# Patient Record
Sex: Male | Born: 1950 | Race: White | Hispanic: No | Smoking: Current every day smoker
Health system: Southern US, Community
[De-identification: ages and names within clinical notes are randomized; demographics above are authoritative.]

## PROBLEM LIST (undated history)

## (undated) DIAGNOSIS — K219 Gastro-esophageal reflux disease without esophagitis: Secondary | ICD-10-CM

## (undated) DIAGNOSIS — F32A Depression, unspecified: Secondary | ICD-10-CM

## (undated) DIAGNOSIS — K635 Polyp of colon: Secondary | ICD-10-CM

## (undated) DIAGNOSIS — K5730 Diverticulosis of large intestine without perforation or abscess without bleeding: Secondary | ICD-10-CM

## (undated) DIAGNOSIS — K5792 Diverticulitis of intestine, part unspecified, without perforation or abscess without bleeding: Secondary | ICD-10-CM

## (undated) DIAGNOSIS — F329 Major depressive disorder, single episode, unspecified: Secondary | ICD-10-CM

## (undated) DIAGNOSIS — N200 Calculus of kidney: Secondary | ICD-10-CM

## (undated) HISTORY — DX: Calculus of kidney: N20.0

## (undated) HISTORY — PX: UPPER GASTROINTESTINAL ENDOSCOPY: SHX188

## (undated) HISTORY — DX: Diverticulosis of large intestine without perforation or abscess without bleeding: K57.30

## (undated) HISTORY — DX: Gastro-esophageal reflux disease without esophagitis: K21.9

## (undated) HISTORY — PX: COLONOSCOPY: SHX174

## (undated) HISTORY — DX: Polyp of colon: K63.5

## (undated) HISTORY — DX: Major depressive disorder, single episode, unspecified: F32.9

## (undated) HISTORY — DX: Depression, unspecified: F32.A

## (undated) HISTORY — DX: Diverticulitis of intestine, part unspecified, without perforation or abscess without bleeding: K57.92

---

## 1999-02-19 ENCOUNTER — Emergency Department (HOSPITAL_COMMUNITY): Admission: EM | Admit: 1999-02-19 | Discharge: 1999-02-20 | Payer: Self-pay | Admitting: Emergency Medicine

## 1999-02-20 ENCOUNTER — Encounter: Payer: Self-pay | Admitting: Emergency Medicine

## 1999-02-22 ENCOUNTER — Encounter: Admission: RE | Admit: 1999-02-22 | Discharge: 1999-02-22 | Payer: Self-pay | Admitting: Plastic Surgery

## 1999-02-22 ENCOUNTER — Encounter: Payer: Self-pay | Admitting: Plastic Surgery

## 1999-05-06 ENCOUNTER — Encounter: Payer: Self-pay | Admitting: Internal Medicine

## 1999-05-06 ENCOUNTER — Encounter: Admission: RE | Admit: 1999-05-06 | Discharge: 1999-05-06 | Payer: Self-pay | Admitting: Internal Medicine

## 2002-05-22 ENCOUNTER — Encounter: Admission: RE | Admit: 2002-05-22 | Discharge: 2002-05-22 | Payer: Self-pay | Admitting: Internal Medicine

## 2002-05-22 ENCOUNTER — Encounter: Payer: Self-pay | Admitting: Internal Medicine

## 2002-08-04 ENCOUNTER — Encounter: Payer: Self-pay | Admitting: Internal Medicine

## 2003-03-11 ENCOUNTER — Encounter: Payer: Self-pay | Admitting: Internal Medicine

## 2004-02-15 ENCOUNTER — Ambulatory Visit: Payer: Self-pay | Admitting: Internal Medicine

## 2004-08-30 ENCOUNTER — Ambulatory Visit: Payer: Self-pay | Admitting: Internal Medicine

## 2004-09-20 ENCOUNTER — Ambulatory Visit: Payer: Self-pay | Admitting: Internal Medicine

## 2004-09-27 ENCOUNTER — Ambulatory Visit: Payer: Self-pay | Admitting: Internal Medicine

## 2004-10-06 ENCOUNTER — Ambulatory Visit: Payer: Self-pay | Admitting: Internal Medicine

## 2004-10-06 ENCOUNTER — Ambulatory Visit (HOSPITAL_COMMUNITY): Admission: RE | Admit: 2004-10-06 | Discharge: 2004-10-06 | Payer: Self-pay | Admitting: Internal Medicine

## 2004-11-17 ENCOUNTER — Ambulatory Visit: Payer: Self-pay | Admitting: Internal Medicine

## 2005-07-27 ENCOUNTER — Ambulatory Visit: Payer: Self-pay | Admitting: Internal Medicine

## 2006-06-27 ENCOUNTER — Ambulatory Visit: Payer: Self-pay | Admitting: Internal Medicine

## 2006-06-27 LAB — CONVERTED CEMR LAB
ALT: 34 units/L (ref 0–40)
AST: 46 units/L — ABNORMAL HIGH (ref 0–37)
Albumin: 3.8 g/dL (ref 3.5–5.2)
Alkaline Phosphatase: 63 units/L (ref 39–117)
BUN: 5 mg/dL — ABNORMAL LOW (ref 6–23)
Basophils Absolute: 0 10*3/uL (ref 0.0–0.1)
Basophils Relative: 0.6 % (ref 0.0–1.0)
Bilirubin, Direct: 0.2 mg/dL (ref 0.0–0.3)
CO2: 31 meq/L (ref 19–32)
Calcium: 9.3 mg/dL (ref 8.4–10.5)
Chloride: 108 meq/L (ref 96–112)
Cholesterol: 169 mg/dL (ref 0–200)
Creatinine, Ser: 0.7 mg/dL (ref 0.4–1.5)
Eosinophils Absolute: 0.1 10*3/uL (ref 0.0–0.6)
Eosinophils Relative: 3.4 % (ref 0.0–5.0)
GFR calc Af Amer: 152 mL/min
GFR calc non Af Amer: 125 mL/min
Glucose, Bld: 85 mg/dL (ref 70–99)
HCT: 42.6 % (ref 39.0–52.0)
HDL: 72.9 mg/dL (ref >39.0)
Hemoglobin: 14.9 g/dL (ref 13.0–17.0)
LDL Cholesterol: 86 mg/dL (ref 0–99)
Lymphocytes Relative: 44.1 % (ref 12.0–46.0)
MCHC: 35 g/dL (ref 30.0–36.0)
MCV: 97.7 fL (ref 78.0–100.0)
Monocytes Absolute: 0.7 10*3/uL (ref 0.2–0.7)
Monocytes Relative: 16.6 % — ABNORMAL HIGH (ref 3.0–11.0)
Neutro Abs: 1.5 10*3/uL (ref 1.4–7.7)
Neutrophils Relative %: 35.3 % — ABNORMAL LOW (ref 43.0–77.0)
PSA: 0.62 ng/mL (ref 0.10–4.00)
Platelets: 253 10*3/uL (ref 150–400)
Potassium: 3.4 meq/L — ABNORMAL LOW (ref 3.5–5.1)
RBC: 4.36 M/uL (ref 4.22–5.81)
RDW: 12.9 % (ref 11.5–14.6)
Sodium: 145 meq/L (ref 135–145)
TSH: 1.19 microintl units/mL (ref 0.35–5.50)
Total Bilirubin: 0.9 mg/dL (ref 0.3–1.2)
Total CHOL/HDL Ratio: 2.3
Total Protein: 6.5 g/dL (ref 6.0–8.3)
Triglycerides: 50 mg/dL (ref 0–149)
VLDL: 10 mg/dL (ref 0–40)
WBC: 4.3 10*3/uL — ABNORMAL LOW (ref 4.5–10.5)

## 2006-07-11 ENCOUNTER — Ambulatory Visit: Payer: Self-pay | Admitting: Internal Medicine

## 2006-10-30 ENCOUNTER — Ambulatory Visit: Payer: Self-pay | Admitting: Internal Medicine

## 2006-11-05 DIAGNOSIS — Z860100 Personal history of colon polyps, unspecified: Secondary | ICD-10-CM | POA: Insufficient documentation

## 2006-11-05 DIAGNOSIS — F329 Major depressive disorder, single episode, unspecified: Secondary | ICD-10-CM | POA: Insufficient documentation

## 2006-11-06 ENCOUNTER — Ambulatory Visit: Payer: Self-pay | Admitting: Internal Medicine

## 2006-12-25 ENCOUNTER — Ambulatory Visit: Payer: Self-pay | Admitting: Internal Medicine

## 2007-01-30 ENCOUNTER — Ambulatory Visit: Payer: Self-pay | Admitting: Internal Medicine

## 2007-01-31 LAB — CONVERTED CEMR LAB
ALT: 27 units/L (ref 0–53)
AST: 35 units/L (ref 0–37)
Albumin: 4.3 g/dL (ref 3.5–5.2)
Alkaline Phosphatase: 80 units/L (ref 39–117)
Amylase: 57 units/L (ref 27–131)
BUN: 5 mg/dL — ABNORMAL LOW (ref 6–23)
Basophils Absolute: 0.1 10*3/uL (ref 0.0–0.1)
Basophils Relative: 0.9 % (ref 0.0–1.0)
Bilirubin, Direct: 0.4 mg/dL — ABNORMAL HIGH (ref 0.0–0.3)
CO2: 31 meq/L (ref 19–32)
Calcium: 10.6 mg/dL — ABNORMAL HIGH (ref 8.4–10.5)
Chloride: 101 meq/L (ref 96–112)
Creatinine, Ser: 0.8 mg/dL (ref 0.4–1.5)
Eosinophils Absolute: 0.1 10*3/uL (ref 0.0–0.6)
Eosinophils Relative: 0.9 % (ref 0.0–5.0)
GFR calc Af Amer: 130 mL/min
GFR calc non Af Amer: 107 mL/min
Glucose, Bld: 120 mg/dL — ABNORMAL HIGH (ref 70–99)
HCT: 48.4 % (ref 39.0–52.0)
Hemoglobin: 16.8 g/dL (ref 13.0–17.0)
Lymphocytes Relative: 22.7 % (ref 12.0–46.0)
MCHC: 34.7 g/dL (ref 30.0–36.0)
MCV: 95.8 fL (ref 78.0–100.0)
Monocytes Absolute: 1 10*3/uL — ABNORMAL HIGH (ref 0.2–0.7)
Monocytes Relative: 15.6 % — ABNORMAL HIGH (ref 3.0–11.0)
Neutro Abs: 4 10*3/uL (ref 1.4–7.7)
Neutrophils Relative %: 59.9 % (ref 43.0–77.0)
Platelets: 222 10*3/uL (ref 150–400)
Potassium: 4.3 meq/L (ref 3.5–5.1)
RBC: 5.05 M/uL (ref 4.22–5.81)
RDW: 12.4 % (ref 11.5–14.6)
Sodium: 141 meq/L (ref 135–145)
Total Bilirubin: 2 mg/dL — ABNORMAL HIGH (ref 0.3–1.2)
Total Protein: 7.3 g/dL (ref 6.0–8.3)
WBC: 6.7 10*3/uL (ref 4.5–10.5)

## 2007-05-03 ENCOUNTER — Ambulatory Visit: Payer: Self-pay | Admitting: Internal Medicine

## 2007-05-03 LAB — CONVERTED CEMR LAB
ALT: 20 units/L (ref 0–53)
AST: 24 units/L (ref 0–37)
Albumin: 4 g/dL (ref 3.5–5.2)
Alkaline Phosphatase: 57 units/L (ref 39–117)
BUN: 6 mg/dL (ref 6–23)
Basophils Absolute: 0 10*3/uL (ref 0.0–0.1)
Basophils Relative: 0.7 % (ref 0.0–1.0)
Bilirubin Urine: NEGATIVE
Bilirubin, Direct: 0.2 mg/dL (ref 0.0–0.3)
Blood in Urine, dipstick: NEGATIVE
CO2: 29 meq/L (ref 19–32)
Calcium: 10 mg/dL (ref 8.4–10.5)
Chloride: 105 meq/L (ref 96–112)
Cholesterol: 178 mg/dL (ref 0–200)
Creatinine, Ser: 0.9 mg/dL (ref 0.4–1.5)
Eosinophils Absolute: 0.2 10*3/uL (ref 0.0–0.6)
Eosinophils Relative: 2.8 % (ref 0.0–5.0)
GFR calc Af Amer: 113 mL/min
GFR calc non Af Amer: 93 mL/min
Glucose, Bld: 104 mg/dL — ABNORMAL HIGH (ref 70–99)
Glucose, Urine, Semiquant: NEGATIVE
HCT: 45.3 % (ref 39.0–52.0)
HDL: 71.1 mg/dL (ref >39.0)
Hemoglobin: 15.6 g/dL (ref 13.0–17.0)
Ketones, urine, test strip: NEGATIVE
LDL Cholesterol: 100 mg/dL — ABNORMAL HIGH (ref 0–99)
Lymphocytes Relative: 38.8 % (ref 12.0–46.0)
MCHC: 34.4 g/dL (ref 30.0–36.0)
MCV: 97.6 fL (ref 78.0–100.0)
Monocytes Absolute: 0.8 10*3/uL — ABNORMAL HIGH (ref 0.2–0.7)
Monocytes Relative: 12.2 % — ABNORMAL HIGH (ref 3.0–11.0)
Neutro Abs: 2.8 10*3/uL (ref 1.4–7.7)
Neutrophils Relative %: 45.5 % (ref 43.0–77.0)
Nitrite: NEGATIVE
PSA: 0.44 ng/mL (ref 0.10–4.00)
Platelets: 265 10*3/uL (ref 150–400)
Potassium: 5.1 meq/L (ref 3.5–5.1)
RBC: 4.64 M/uL (ref 4.22–5.81)
RDW: 13.2 % (ref 11.5–14.6)
Sodium: 142 meq/L (ref 135–145)
Specific Gravity, Urine: >=1.03
TSH: 2.15 microintl units/mL (ref 0.35–5.50)
Total Bilirubin: 1 mg/dL (ref 0.3–1.2)
Total CHOL/HDL Ratio: 2.5
Total Protein: 6.5 g/dL (ref 6.0–8.3)
Triglycerides: 34 mg/dL (ref 0–149)
Urobilinogen, UA: 0.2
VLDL: 7 mg/dL (ref 0–40)
WBC Urine, dipstick: NEGATIVE
WBC: 6.2 10*3/uL (ref 4.5–10.5)
pH: 5

## 2007-05-10 ENCOUNTER — Ambulatory Visit: Payer: Self-pay | Admitting: Internal Medicine

## 2007-05-14 DIAGNOSIS — K5730 Diverticulosis of large intestine without perforation or abscess without bleeding: Secondary | ICD-10-CM | POA: Insufficient documentation

## 2008-06-22 ENCOUNTER — Ambulatory Visit: Payer: Self-pay | Admitting: Internal Medicine

## 2008-06-22 LAB — CONVERTED CEMR LAB
ALT: 45 units/L (ref 0–53)
AST: 60 units/L — ABNORMAL HIGH (ref 0–37)
Albumin: 4.2 g/dL (ref 3.5–5.2)
Alkaline Phosphatase: 59 units/L (ref 39–117)
BUN: 5 mg/dL — ABNORMAL LOW (ref 6–23)
Bilirubin, Direct: 0.1 mg/dL (ref 0.0–0.3)
Chloride: 106 meq/L (ref 96–112)
Cholesterol: 177 mg/dL (ref 0–200)
Eosinophils Absolute: 0.1 10*3/uL (ref 0.0–0.7)
Eosinophils Relative: 2.4 % (ref 0.0–5.0)
GFR calc non Af Amer: 124 mL/min
Glucose, Urine, Semiquant: NEGATIVE
HCT: 45.4 % (ref 39.0–52.0)
HDL: 74.1 mg/dL (ref >39.0)
Ketones, urine, test strip: NEGATIVE
MCHC: 35.3 g/dL (ref 30.0–36.0)
MCV: 97.7 fL (ref 78.0–100.0)
Monocytes Relative: 11.8 % (ref 3.0–12.0)
Neutro Abs: 2.6 10*3/uL (ref 1.4–7.7)
Neutrophils Relative %: 52 % (ref 43.0–77.0)
Nitrite: POSITIVE
PSA: 0.52 ng/mL (ref 0.10–4.00)
Platelets: 224 10*3/uL (ref 150–400)
Potassium: 4 meq/L (ref 3.5–5.1)
RDW: 12.6 % (ref 11.5–14.6)
Specific Gravity, Urine: >=1.03
Total Bilirubin: 1.1 mg/dL (ref 0.3–1.2)
Total CHOL/HDL Ratio: 2.4
Triglycerides: 49 mg/dL (ref 0–149)
Urobilinogen, UA: 0.2
VLDL: 10 mg/dL (ref 0–40)
WBC Urine, dipstick: NEGATIVE
WBC: 5 10*3/uL (ref 4.5–10.5)
pH: 5

## 2008-06-25 ENCOUNTER — Ambulatory Visit: Payer: Self-pay | Admitting: Internal Medicine

## 2009-07-05 ENCOUNTER — Ambulatory Visit: Payer: Self-pay | Admitting: Internal Medicine

## 2010-02-21 ENCOUNTER — Ambulatory Visit: Payer: Self-pay | Admitting: Internal Medicine

## 2010-02-21 LAB — CONVERTED CEMR LAB
ALT: 25 units/L (ref 0–53)
AST: 43 units/L — ABNORMAL HIGH (ref 0–37)
BUN: 8 mg/dL (ref 6–23)
Basophils Absolute: 0 10*3/uL (ref 0.0–0.1)
Bilirubin Urine: NEGATIVE
Bilirubin, Direct: 0.1 mg/dL (ref 0.0–0.3)
Blood in Urine, dipstick: NEGATIVE
Cholesterol: 174 mg/dL (ref 0–200)
Creatinine, Ser: 0.8 mg/dL (ref 0.4–1.5)
Eosinophils Relative: 3.3 % (ref 0.0–5.0)
GFR calc non Af Amer: 114.03 mL/min (ref >60)
Glucose, Bld: 91 mg/dL (ref 70–99)
HDL: 93 mg/dL (ref >39.00)
Ketones, urine, test strip: NEGATIVE
LDL Cholesterol: 76 mg/dL (ref 0–99)
Monocytes Absolute: 0.6 10*3/uL (ref 0.1–1.0)
Monocytes Relative: 12.4 % — ABNORMAL HIGH (ref 3.0–12.0)
Neutrophils Relative %: 43.6 % (ref 43.0–77.0)
PSA: 0.86 ng/mL (ref 0.10–4.00)
Platelets: 195 10*3/uL (ref 150.0–400.0)
Protein, U semiquant: NEGATIVE
RDW: 13.6 % (ref 11.5–14.6)
TSH: 1.23 microintl units/mL (ref 0.35–5.50)
Total Bilirubin: 0.8 mg/dL (ref 0.3–1.2)
Triglycerides: 26 mg/dL (ref 0.0–149.0)
Urobilinogen, UA: 0.2
VLDL: 5.2 mg/dL (ref 0.0–40.0)
WBC: 4.5 10*3/uL (ref 4.5–10.5)

## 2010-03-01 ENCOUNTER — Ambulatory Visit: Payer: Self-pay | Admitting: Internal Medicine

## 2010-03-01 DIAGNOSIS — K625 Hemorrhage of anus and rectum: Secondary | ICD-10-CM

## 2010-03-01 DIAGNOSIS — F17200 Nicotine dependence, unspecified, uncomplicated: Secondary | ICD-10-CM

## 2010-03-09 ENCOUNTER — Encounter (INDEPENDENT_AMBULATORY_CARE_PROVIDER_SITE_OTHER): Payer: Self-pay | Admitting: *Deleted

## 2010-03-11 ENCOUNTER — Encounter: Payer: Self-pay | Admitting: Internal Medicine

## 2010-04-13 ENCOUNTER — Encounter (INDEPENDENT_AMBULATORY_CARE_PROVIDER_SITE_OTHER): Payer: Self-pay | Admitting: *Deleted

## 2010-04-15 ENCOUNTER — Ambulatory Visit
Admission: RE | Admit: 2010-04-15 | Discharge: 2010-04-15 | Payer: Self-pay | Source: Home / Self Care | Attending: Gastroenterology | Admitting: Gastroenterology

## 2010-04-29 ENCOUNTER — Encounter: Payer: Self-pay | Admitting: Gastroenterology

## 2010-04-29 ENCOUNTER — Ambulatory Visit
Admission: RE | Admit: 2010-04-29 | Discharge: 2010-04-29 | Payer: Self-pay | Source: Home / Self Care | Attending: Gastroenterology | Admitting: Gastroenterology

## 2010-05-10 NOTE — Letter (Signed)
Summary: Pre Visit Letter Revised  [facility]  [address] [place], [place] [address]   Phone: [phone]  Fax: [phone]        03/09/2010 MRN: [id number] [facility] [address] [place], [place]  [address]             Procedure Date:  04-29-10   Welcome to the Gastroenterology Division at [facility].    You are scheduled to see a nurse for your pre-procedure visit on 04-15-10 at 2:00p.m. on the 3rd floor at [facility], [address]  We ask that you try to arrive at our office 15 minutes prior to your appointment time to allow for check-in.  Please take a minute to review the attached form.  If you answer "Yes" to one or more of the questions on the first page, we ask that you call the person listed at your earliest opportunity.  If you answer "No" to all of the questions, please complete the rest of the form and bring it to your appointment.    Your nurse visit will consist of discussing your medical and surgical history, your immediate family medical history, and your medications.   If you are unable to list all of your medications on the form, please bring the medication bottles to your appointment and we will list them.  We will need to be aware of both prescribed and over the counter drugs.  We will need to know exact dosage information as well.    Please be prepared to read and sign documents such as consent forms, a financial agreement, and acknowledgement forms.  If necessary, and with your consent, a friend or relative is welcome to sit-in on the nurse visit with you.  Please bring your insurance card so that we may make a copy of it.  If your insurance requires a referral to see a specialist, please bring your referral form from your primary care physician.  No co-pay is required for this nurse visit.     If you cannot keep your appointment, please call [phone] to cancel or reschedule prior to your appointment date.  This  allows [place] the opportunity to schedule an appointment for another patient in need of care.    Thank you for choosing [facility] for your medical needs.  We appreciate the opportunity to care for you.  Please visit [place] at our website  to learn more about our practice.  Sincerely, The Gastroenterology Division

## 2010-05-10 NOTE — Assessment & Plan Note (Signed)
Summary: CPX // RS   Vital Signs:  Patient profile:   60 year old male Height:      68 inches Weight:      144 pounds BMI:     21.97 Temp:     98.4 degrees F oral Pulse rate:   80 / minute Pulse rhythm:   regular BP sitting:   132 / 90  (left arm) Cuff size:   regular  Vitals Entered By: Alfred Levins, CMA (March 01, 2010 10:17 AM) CC: cpx   CC:  cpx.  History of Present Illness: CPX  he has noted some rectal bleeding in the past several months---last colnoscopy 2004.   has noted a lump on neck for several years---has become more bothersome---he would like removed.     Current Problems (verified): 1)  Diverticulosis, Colon  (ICD-562.10) 2)  Physical Examination  (ICD-V70.0) 3)  Colonic Polyps, Hx of  (ICD-V12.72) 4)  Depression  (ICD-311)  Current Medications (verified): 1)  Trazodone Hcl 100 Mg Tabs (Trazodone Hcl) .... 1/2-1 By Mouth At Bedtime As Needed Insomnia  Allergies (verified): No Known Drug Allergies  Past History:  Past Medical History: Last updated: 05/14/2007 Depression Diverticulitis, hx of Hx of Kidney Stones 1997 Nephrolithiasis, hx of Colonic polyps, hx of Diverticulosis, colon  Past Surgical History: Last updated: 01/30/2007 Denies surgical history  Family History: Last updated: 05/10/2007 mother - hearing loss father-alive and well   Social History: Last updated: 06/25/2008 Married-wife has left and taken children----now divorced Regular exercise-no Current Smoker Alcohol use-yes Occupation: Programmer, multimedia  Risk Factors: Alcohol Use: <1 (05/10/2007) Exercise: no (12/25/2006)  Risk Factors: Smoking Status: current (07/05/2009) Packs/Day: <1 (11/09/2006)  Physical Exam  General:  well-developed male in no acute distress. HEENT exam atraumatic, normocephalic symmetric her muscles are intact. Neck is supple without lymphadenopathy, thyromegaly, jugular venous distention or carotid bruits. Chest clear to auscultation without  increased work of breathing. Cardiac exam S1-S2 are regular without murmurs bowels. Abdominal exam active bowel sounds, soft and nontender. Extremities there is no clubbing cyanosis or edema. Neurologic exam is alert and oriented without any motor or sensory deficits.dermatologic exam he does have a 1.5 cm cyst on the right side of his neck. Fluctuant in character.   Impression & Recommendations:  Problem # 1:  PHYSICAL EXAMINATION (ICD-V70.0) I've encouraged him to participate in daily physical activity. He should exercise at least 45 minutes every day. He should continue eating a healthy diet. Health maintenance issues are up-to-date.  Problem # 2:  TOBACCO USE (ICD-305.1)  Encouraged smoking cessation and discussed different methods for smoking cessation.   Problem # 3:  RECTAL BLEEDING (ICD-569.3)  new onset needs colonoscopy  Orders: Gastroenterology Referral (GI)  Problem # 4:  SEBACEOUS CYST (ICD-706.2)  patient wanted this excised. I'll refer to general surgery.  Orders: Surgical Referral (Surgery)  Complete Medication List: 1)  Trazodone Hcl 100 Mg Tabs (Trazodone hcl) .... 1/2-1 by mouth at bedtime as needed insomnia   Orders Added: 1)  Gastroenterology Referral [GI] 2)  Est. Patient Level II [30160] 3)  Est. Patient 40-64 years [[address]] 4)  Surgical Referral [Surgery]

## 2010-05-10 NOTE — Assessment & Plan Note (Signed)
Summary: back pain/njr   Vital Signs:  Patient profile:   60 year old male Weight:      144 pounds Temp:     98.7 degrees F oral BP sitting:   170 / 100  (right arm) Cuff size:   regular  Vitals Entered By: Duard Brady LPN (July 05, 2009 2:36 PM) CC: c/o (R) shoulder/back pain for several days , no fall or injury noted. Is Patient Diabetic? No   CC:  c/o (R) shoulder/back pain for several days  and no fall or injury noted.Marland Kitchen  History of Present Illness: 60 year old patient who presents with a 4-day history of atraumatic right upper back pain.  Pain is aggravated by movement of the right shoulder as well as local pressure.  He has had no similar episodes of pain.  Pain is described as a dull, constant ache with a sharp component with movement.  Pain does not radiate.  Denies any motor weakness, paresthesias  Preventive Screening-Counseling & Management  Alcohol-Tobacco     Smoking Status: current  Allergies (verified): No Known Drug Allergies  Past History:  Past Medical History: Reviewed history from 05/14/2007 and no changes required. Depression Diverticulitis, hx of Hx of Kidney Stones 1997 Nephrolithiasis, hx of Colonic polyps, hx of Diverticulosis, colon  Review of Systems  The patient denies anorexia, fever, weight loss, weight gain, vision loss, decreased hearing, hoarseness, chest pain, syncope, dyspnea on exertion, peripheral edema, prolonged cough, headaches, hemoptysis, abdominal pain, melena, hematochezia, severe indigestion/heartburn, hematuria, incontinence, genital sores, muscle weakness, suspicious skin lesions, transient blindness, difficulty walking, depression, unusual weight change, abnormal bleeding, enlarged lymph nodes, angioedema, breast masses, and testicular masses.         right upper back pain  Physical Exam  General:  Well-developed,well-nourished,in no acute distress; alert,appropriate and cooperative throughout examination; repeat  blood pressure 128/86 Msk:  range of motion of the right shoulder impacted.  Internal rotation of the shoulder did tend to aggravate the pain.  There was local tenderness involving the right upper back region.  No muscle atrophy noted.  Reflexes were quite brisk and equal.  Motor strength intact   Impression & Recommendations:  Problem # 1:  BACK PAIN, UPPER (ICD-724.5)  His updated medication list for this problem includes:    Cyclobenzaprine Hcl 5 Mg Tabs (Cyclobenzaprine hcl) ..... One tablet 3 times daily as needed for back pain  His updated medication list for this problem includes:    Cyclobenzaprine Hcl 5 Mg Tabs (Cyclobenzaprine hcl) ..... One tablet 3 times daily as needed for back pain  Complete Medication List: 1)  Trazodone Hcl 100 Mg Tabs (Trazodone hcl) .... 1/2-1 by mouth at bedtime as needed insomnia 2)  Cyclobenzaprine Hcl 5 Mg Tabs (Cyclobenzaprine hcl) .... One tablet 3 times daily as needed for back pain  Patient Instructions: 1)  apply heat to the area 4 times daily 2)  avoid activities that aggravate the pain 3)  Two Aleve twice daily 4)  Call if unimproved Prescriptions: CYCLOBENZAPRINE HCL 5 MG TABS (CYCLOBENZAPRINE HCL) one tablet 3 times daily as needed for back pain  #50 x 0   Entered and Authorized by:   Gordy Savers  MD   Signed by:   Gordy Savers  MD on 07/05/2009   Method used:   Print then Give to Patient   RxID:   [id number] TRAZODONE HCL 100 MG TABS (TRAZODONE HCL) 1/2-1 by mouth at bedtime as needed insomnia  #90 x 6   Entered  and Authorized by:   Gordy Savers  MD   Signed by:   Gordy Savers  MD on 07/05/2009   Method used:   Print then Give to Patient   RxID:   [id number]

## 2010-05-12 NOTE — Letter (Signed)
Summary: [facility]   Imported By: Maryln Gottron 03/24/2010 13:18:39  _____________________________________________________________________  External Attachment:    Type:   Image     Comment:   External Document

## 2010-05-12 NOTE — Letter (Signed)
Summary: Moviprep Instructions  [facility]  [address]   [place], [place] [address]   Phone: [phone]  Fax: [phone]       Patrick [name]    April 03, 1951    MRN: [id number]        Procedure Day /Date: Friday, 04-29-10     Arrival Time: 10:00 a.m.     Procedure Time: 11:00 a.m.     Location of Procedure:                    x  [facility] (4th Floor)  PREPARATION FOR COLONOSCOPY WITH MOVIPREP   Starting 5 days prior to your procedure 04-24-10 do not eat nuts, seeds, popcorn, corn, beans, peas,  salads, or any raw vegetables.  Do not take any fiber supplements (e.g. Metamucil, Citrucel, and Benefiber).  THE DAY BEFORE YOUR PROCEDURE         DATE: 04-28-10   DAY: Thursday  1.  Drink clear liquids the entire day-NO SOLID FOOD  2.  Do not drink anything colored red or purple.  Avoid juices with pulp.  No orange juice.  3.  Drink at least 64 oz. (8 glasses) of fluid/clear liquids during the day to prevent dehydration and help the prep work efficiently.  CLEAR LIQUIDS INCLUDE: Water Jello Ice Popsicles Tea (sugar ok, no milk/cream) Powdered fruit flavored drinks Coffee (sugar ok, no milk/cream) Gatorade Juice: apple, white grape, white cranberry  Lemonade Clear bullion, consomm, broth Carbonated beverages (any kind) Strained chicken noodle soup Hard Candy                             4.  In the morning, mix first dose of MoviPrep solution:    Empty 1 Pouch A and 1 Pouch B into the disposable container    Add lukewarm drinking water to the top line of the container. Mix to dissolve    Refrigerate (mixed solution should be used within 24 hrs)  5.  Begin drinking the prep at 5:00 p.m. The MoviPrep container is divided by 4 marks.   Every 15 minutes drink the solution down to the next mark (approximately 8 oz) until the full liter is complete.   6.  Follow completed prep with 16 oz of clear liquid of your choice (Nothing red or purple).   Continue to drink clear liquids until bedtime.  7.  Before going to bed, mix second dose of MoviPrep solution:    Empty 1 Pouch A and 1 Pouch B into the disposable container    Add lukewarm drinking water to the top line of the container. Mix to dissolve    Refrigerate  THE DAY OF YOUR PROCEDURE      DATE: 04-29-10  DAY: Friday  Beginning at 6:00 a.m. (5 hours before procedure):         1. Every 15 minutes, drink the solution down to the next mark (approx 8 oz) until the full liter is complete.  2. Follow completed prep with 16 oz. of clear liquid of your choice.    3. You may drink clear liquids until 9:00 a.m.  (2 HOURS BEFORE PROCEDURE).   MEDICATION INSTRUCTIONS  Unless otherwise instructed, you should take regular prescription medications with a small sip of water   as early as possible the morning of your procedure.           OTHER INSTRUCTIONS  You will need a responsible  adult at least 58 years of age to accompany you and drive you home.   This person must remain in the waiting room during your procedure.  Wear loose fitting clothing that is easily removed.  Leave jewelry and other valuables at home.  However, you may wish to bring a book to read or  an iPod/MP3 player to listen to music as you wait for your procedure to start.  Remove all body piercing jewelry and leave at home.  Total time from sign-in until discharge is approximately 2-3 hours.  You should go home directly after your procedure and rest.  You can resume normal activities the  day after your procedure.  The day of your procedure you should not:   Drive   Make legal decisions   Operate machinery   Drink alcohol   Return to work  You will receive specific instructions about eating, activities and medications before you leave.    The above instructions have been reviewed and explained to me by   Ezra Sites RN  April 15, 2010 2:26 PM    I fully understand and can verbalize  these instructions _____________________________ Date _________

## 2010-05-12 NOTE — Procedures (Signed)
Summary: Colonoscopy  Patient: [name] Note: All result statuses are Final unless otherwise noted.  Tests: (1) Colonoscopy (COL)   COL Colonoscopy           DONE     [facility]     [address]     [place], [place]  [address]           COLONOSCOPY PROCEDURE REPORT     PATIENT:  Patrick [name], Patrick [name]  MR#:  [id number]     BIRTHDATE:  24-May-1950, 59 yrs. old  GENDER:  male     ENDOSCOPIST:  Judie Petit T. Russella Dar, MD, [facility]           PROCEDURE DATE:  04/29/2010     PROCEDURE:  Colonoscopy [address]     ASA CLASS:  Class II     INDICATIONS:  1) surveillance and high-risk screening  2) history     of pre-cancerous (adenomatous) colon polyps: 07/2002.     MEDICATIONS:   Fentanyl 100 mcg IV, Versed 10 mg IV     DESCRIPTION OF PROCEDURE:   After the risks benefits and     alternatives of the procedure were thoroughly explained, informed     consent was obtained.  Digital rectal exam was performed and     revealed no abnormalities.   The LB [id number]-[id number] endoscope     was introduced through the anus and advanced to the cecum, which     was identified by both the appendix and ileocecal valve, without     limitations.  The quality of the prep was excellent, using     MoviPrep.  The instrument was then slowly withdrawn as the colon     was fully examined.     <<PROCEDUREIMAGES>>     FINDINGS:  Moderate diverticulosis was found throughout the colon,     concentrated in the sigmoid and descending and scattered from the     transverse  to cecum.  This was otherwise a normal examination of     the colon.  Retroflexed views in the rectum revealed no     abnormalities. The time to cecum =  2.25  minutes. The scope was     then withdrawn (time =  8.33  min) from the patient and the     procedure completed.     COMPLICATIONS:  None           ENDOSCOPIC IMPRESSION:     1) Moderate diverticulosis     RECOMMENDATIONS:     1) High fiber diet with liberal fluid intake.     2) Repeat Colonoscopy  in 5 years.           Venita Lick. Russella Dar, MD, [facility] Graham           CC:  Lindley Magnus, MD           n.     Rosalie DoctorVenita Lick. Kaleo Condrey at 04/29/2010 11:19 AM           Christiane Ha, [phone]  Note: An exclamation mark (!) indicates a result that was not dispersed into the flowsheet. Document Creation Date: 04/29/2010 11:20 AM _______________________________________________________________________  (1) Order result status: Final Collection or observation date-time: 04/29/2010 11:13 Requested date-time:  Receipt date-time:  Reported date-time:  Referring Physician:   Ordering Physician: Claudette Head [phone]) Specimen Source:  Source: Launa Grill Order Number: [id number] Lab site:   Appended Document: Colonoscopy    Clinical Lists Changes  Observations: Added  new observation of COLONNXTDUE: 04/2015 (04/29/2010 14:06)

## 2010-05-12 NOTE — Miscellaneous (Signed)
Summary: [facility]  Clinical Lists Changes  Medications: Added new medication of MOVIPREP 100 GM  SOLR (PEG-KCL-NACL-NASULF-NA ASC-C) As per prep instructions. - Signed Rx of MOVIPREP 100 GM  SOLR (PEG-KCL-NACL-NASULF-NA ASC-C) As per prep instructions.;  #1 x 0;  Signed;  Entered by: Ezra Sites RN;  Authorized by: Meryl Dare MD [facility];  Method used: Electronically to [id number]*, [address], [place], [place]  [address], Ph: [phone], Fax: [phone] Observations: Added new observation of NKA: T (04/15/2010 13:42)    Prescriptions: MOVIPREP 100 GM  SOLR (PEG-KCL-NACL-NASULF-NA ASC-C) As per prep instructions.  #1 x 0   Entered by:   Ezra Sites RN   Authorized by:   Meryl Dare MD [facility]   Signed by:   Ezra Sites RN on 04/15/2010   Method used:   Electronically to        [facility] [id number] (retail)       [address]       [place], [place]  [address]       Ph: [phone]       Fax: [phone]   RxID:   [id number]

## 2010-06-06 ENCOUNTER — Ambulatory Visit (HOSPITAL_BASED_OUTPATIENT_CLINIC_OR_DEPARTMENT_OTHER)
Admission: RE | Admit: 2010-06-06 | Discharge: 2010-06-06 | Disposition: A | Discharge status: Home / Self Care | Payer: 59 | Source: Ambulatory Visit | Attending: Surgery | Admitting: Surgery

## 2010-06-06 ENCOUNTER — Other Ambulatory Visit: Payer: Self-pay | Admitting: Surgery

## 2010-06-06 DIAGNOSIS — L723 Sebaceous cyst: Secondary | ICD-10-CM | POA: Insufficient documentation

## 2010-06-20 NOTE — Op Note (Signed)
  NAMEBENTZION, DAURIA                 ACCOUNT NO.:  [id number]  MEDICAL RECORD NO.:  [id number]           PATIENT TYPE:  LOCATION:                                 FACILITY:  PHYSICIAN:  Sandria Bales. Ezzard Standing, M.D.  DATE OF BIRTH:  [birth year]  DATE OF PROCEDURE: 06 Jun 2010                              OPERATIVE REPORT   PREOPERATIVE DIAGNOSES:  An 1.5-cm sebaceous cyst, right neck; two papillomas, one above, one below the cyst.  POSTOPERATIVE DIAGNOSES:  An 1.5 cm sebaceous cyst, right neck; two papillomas, one above and below the cyst.  PROCEDURE:  Excision of 2 papillomas and removal of sebaceous cyst, right neck.  SURGEON:  Sandria Bales. Ezzard Standing, M.D.  FIRST ASSISTANT:  None.  ANESTHESIA:  Approximately 7 mL of 1% Xylocaine.  COMPLICATIONS:  None.  PROCEDURE:  Mr. Fraticelli is a 60 year old male who has a cyst of his right neck and wants to have this excised.  He is a patient of Dr. Birdie Sons.  I discussed with him the indications, potential complications including bleeding and infection, recurrence of the cyst.  OPERATIVE NOTE:  The patient placed on left lateral decubitus position. He was awake during the procedure.  His right neck was prepped with Betadine, sterilely dressed.  He had an 1.5-cm cyst in his right posterior neck.  About 1 cm anterior to this was a papilloma, 2 cm posterior to this was a second papilloma.  I removed both papillomas with the scissors.  I then put a block of 7 cmm of 1% Xylocaine, I removed both the papillomas and I excised the sebaceous cyst, got the entire sac out intact.  I then closed the skin with a deep layers of 5-0 Vicryl suture, superficial was a 5-0 running Vicryl suture.  The wound was painted with tincture of benzoin and Steri-Strip.  The patient tolerated the procedure well.  He will see me back in the office in 2-3 weeks for followup and review of pathology.   Sandria Bales. Ezzard Standing, M.D., FACS   DHN/MEDQ  D:  06/06/2010  T:   06/07/2010  Job:  [id number]  cc:   Valetta Mole. Swords, MD  Electronically Signed by Ovidio Kin M.D. on 06/20/2010 09:24:00 PM

## 2011-03-08 ENCOUNTER — Telehealth: Payer: Self-pay | Admitting: Internal Medicine

## 2011-03-08 NOTE — Telephone Encounter (Signed)
Pt called and said that he has to get his cpx done before end of year. Pls advise.

## 2011-03-09 NOTE — Telephone Encounter (Signed)
Try to work in 

## 2011-03-09 NOTE — Telephone Encounter (Signed)
Pt called again. I put him on 04-05-2011, if that's ok. Thanks.

## 2011-03-09 NOTE — Telephone Encounter (Signed)
Pls advise where to work pt is sch. The only date avail is 04/05/11 and there is already 3 - ov sch that day.

## 2011-03-29 ENCOUNTER — Other Ambulatory Visit (INDEPENDENT_AMBULATORY_CARE_PROVIDER_SITE_OTHER): Payer: 59

## 2011-03-29 DIAGNOSIS — Z0000 Encounter for general adult medical examination without abnormal findings: Secondary | ICD-10-CM

## 2011-03-29 LAB — HEPATIC FUNCTION PANEL
ALT: 24 U/L (ref 0–53)
Albumin: 4.2 g/dL (ref 3.5–5.2)
Total Protein: 7 g/dL (ref 6.0–8.3)

## 2011-03-29 LAB — CBC WITH DIFFERENTIAL/PLATELET
Basophils Relative: 0.8 % (ref 0.0–3.0)
Eosinophils Absolute: 0.2 10*3/uL (ref 0.0–0.7)
Eosinophils Relative: 2.9 % (ref 0.0–5.0)
HCT: 43.6 % (ref 39.0–52.0)
Lymphs Abs: 2.6 10*3/uL (ref 0.7–4.0)
MCHC: 34.2 g/dL (ref 30.0–36.0)
MCV: 98.5 fl (ref 78.0–100.0)
Monocytes Absolute: 0.7 10*3/uL (ref 0.1–1.0)
Neutro Abs: 2 10*3/uL (ref 1.4–7.7)
Neutrophils Relative %: 35.4 % — ABNORMAL LOW (ref 43.0–77.0)
RBC: 4.43 Mil/uL (ref 4.22–5.81)
WBC: 5.5 10*3/uL (ref 4.5–10.5)

## 2011-03-29 LAB — POCT URINALYSIS DIPSTICK
Leukocytes, UA: NEGATIVE
Nitrite, UA: NEGATIVE
Protein, UA: NEGATIVE
Urobilinogen, UA: 1

## 2011-03-29 LAB — BASIC METABOLIC PANEL
CO2: 32 mEq/L (ref 19–32)
Chloride: 100 mEq/L (ref 96–112)
Creatinine, Ser: 0.7 mg/dL (ref 0.4–1.5)
Potassium: 4.3 mEq/L (ref 3.5–5.1)

## 2011-03-29 LAB — LIPID PANEL
LDL Cholesterol: 76 mg/dL (ref 0–99)
Total CHOL/HDL Ratio: 2
VLDL: 10.6 mg/dL (ref 0.0–40.0)

## 2011-03-29 LAB — PSA: PSA: 1.07 ng/mL (ref 0.10–4.00)

## 2011-04-05 ENCOUNTER — Encounter: Payer: 59 | Admitting: Internal Medicine

## 2011-04-06 ENCOUNTER — Encounter: Payer: Self-pay | Admitting: Internal Medicine

## 2011-04-06 ENCOUNTER — Ambulatory Visit (INDEPENDENT_AMBULATORY_CARE_PROVIDER_SITE_OTHER): Payer: 59 | Admitting: Internal Medicine

## 2011-04-06 VITALS — BP 142/102 | HR 95 | Temp 98.9°F | Ht 67.5 in | Wt 135.0 lb

## 2011-04-06 DIAGNOSIS — R7989 Other specified abnormal findings of blood chemistry: Secondary | ICD-10-CM

## 2011-04-06 DIAGNOSIS — F17200 Nicotine dependence, unspecified, uncomplicated: Secondary | ICD-10-CM

## 2011-04-06 DIAGNOSIS — Z23 Encounter for immunization: Secondary | ICD-10-CM

## 2011-04-06 DIAGNOSIS — R634 Abnormal weight loss: Secondary | ICD-10-CM

## 2011-04-06 DIAGNOSIS — Z0000 Encounter for general adult medical examination without abnormal findings: Secondary | ICD-10-CM

## 2011-04-06 NOTE — Progress Notes (Signed)
Patient ID: Patrick [name], male   DOB: 09-20-1950, 60 y.o.   MRN: [id number] cpx  Had flu sxs earlier this week. Feeling better but "feels dehydrated".  Past Medical History  Diagnosis Date  . Depression   . Diverticulitis   . Kidney stone   . Colon polyps   . Diverticulosis of colon     History   Social History  . Marital Status: Divorced    Spouse Name: N/A    Number of Children: N/A  . Years of Education: N/A   Occupational History  . Not on file.   Social History Main Topics  . Smoking status: Current Everyday Smoker -- 0.5 packs/day    Types: Cigarettes  . Smokeless tobacco: Not on file  . Alcohol Use: Yes  . Drug Use: No  . Sexually Active: Not on file   Other Topics Concern  . Not on file   Social History Narrative  . No narrative on file    History reviewed. No pertinent past surgical history.  Family History  Problem Relation Age of Onset  . Heart disease Father 1    cabg    No Known Allergies  No current outpatient prescriptions on file prior to visit.     patient denies chest pain, shortness of breath, orthopnea. Denies lower extremity edema, abdominal pain, change in appetite, change in bowel movements. Patient denies rashes, musculoskeletal complaints. No other specific complaints in a complete review of systems.   BP 142/102  Pulse 104  Temp(Src) 98.9 F (37.2 C) (Oral)  Ht 5' 7.5" (1.715 m)  Wt 135 lb (61.236 kg)  BMI 20.83 kg/m2  well-developed well-nourished male in no acute distress. HEENT exam atraumatic, normocephalic, neck supple without jugular venous distention. Chest clear to auscultation cardiac exam S1-S2 are regular. Abdominal exam overweight with bowel sounds, soft and nontender. Extremities no edema. Neurologic exam is alert with a normal gait.  Well visit:  Health maint UTD- He will come back for flu immunization

## 2011-04-26 ENCOUNTER — Ambulatory Visit (INDEPENDENT_AMBULATORY_CARE_PROVIDER_SITE_OTHER)
Admission: RE | Admit: 2011-04-26 | Discharge: 2011-04-26 | Disposition: A | Discharge status: Home / Self Care | Payer: 59 | Source: Ambulatory Visit | Attending: Internal Medicine | Admitting: Internal Medicine

## 2011-04-26 DIAGNOSIS — R634 Abnormal weight loss: Secondary | ICD-10-CM

## 2011-04-26 DIAGNOSIS — F17200 Nicotine dependence, unspecified, uncomplicated: Secondary | ICD-10-CM

## 2011-07-21 ENCOUNTER — Other Ambulatory Visit (INDEPENDENT_AMBULATORY_CARE_PROVIDER_SITE_OTHER): Payer: 59

## 2011-07-21 DIAGNOSIS — R7989 Other specified abnormal findings of blood chemistry: Secondary | ICD-10-CM

## 2011-07-21 LAB — HEPATIC FUNCTION PANEL
ALT: 25 U/L (ref 0–53)
AST: 55 U/L — ABNORMAL HIGH (ref 0–37)
Alkaline Phosphatase: 65 U/L (ref 39–117)
Total Bilirubin: 0.3 mg/dL (ref 0.3–1.2)

## 2011-07-22 LAB — HEPATITIS B CORE ANTIBODY, TOTAL: Hep B Core Total Ab: NEGATIVE

## 2011-07-22 LAB — HEPATITIS C ANTIBODY: HCV Ab: NEGATIVE

## 2011-09-11 ENCOUNTER — Encounter: Payer: Self-pay | Admitting: Family Medicine

## 2012-02-29 ENCOUNTER — Other Ambulatory Visit (INDEPENDENT_AMBULATORY_CARE_PROVIDER_SITE_OTHER): Payer: 59

## 2012-02-29 DIAGNOSIS — Z0000 Encounter for general adult medical examination without abnormal findings: Secondary | ICD-10-CM

## 2012-02-29 LAB — CBC WITH DIFFERENTIAL/PLATELET
Basophils Relative: 1.2 % (ref 0.0–3.0)
Eosinophils Relative: 1.2 % (ref 0.0–5.0)
HCT: 41.4 % (ref 39.0–52.0)
Hemoglobin: 13.9 g/dL (ref 13.0–17.0)
Lymphs Abs: 1.6 10*3/uL (ref 0.7–4.0)
MCV: 99.8 fl (ref 78.0–100.0)
Monocytes Absolute: 0.8 10*3/uL (ref 0.1–1.0)
Monocytes Relative: 13.6 % — ABNORMAL HIGH (ref 3.0–12.0)
Neutro Abs: 3.2 10*3/uL (ref 1.4–7.7)
Platelets: 198 10*3/uL (ref 150.0–400.0)
WBC: 5.7 10*3/uL (ref 4.5–10.5)

## 2012-02-29 LAB — HEPATIC FUNCTION PANEL
AST: 43 U/L — ABNORMAL HIGH (ref 0–37)
Albumin: 4.2 g/dL (ref 3.5–5.2)
Alkaline Phosphatase: 64 U/L (ref 39–117)
Total Protein: 7.2 g/dL (ref 6.0–8.3)

## 2012-02-29 LAB — BASIC METABOLIC PANEL
BUN: 5 mg/dL — ABNORMAL LOW (ref 6–23)
CO2: 28 mEq/L (ref 19–32)
Calcium: 9.7 mg/dL (ref 8.4–10.5)
Creatinine, Ser: 0.6 mg/dL (ref 0.4–1.5)
GFR: 143.71 mL/min (ref >60.00)
Glucose, Bld: 78 mg/dL (ref 70–99)
Sodium: 139 mEq/L (ref 135–145)

## 2012-02-29 LAB — POCT URINALYSIS DIPSTICK
Ketones, UA: NEGATIVE
Spec Grav, UA: >=1.03
pH, UA: 5.5

## 2012-02-29 LAB — LIPID PANEL
HDL: 94.6 mg/dL (ref >39.00)
Total CHOL/HDL Ratio: 2

## 2012-03-06 ENCOUNTER — Encounter: Payer: Self-pay | Admitting: Family

## 2012-03-06 ENCOUNTER — Ambulatory Visit (INDEPENDENT_AMBULATORY_CARE_PROVIDER_SITE_OTHER): Payer: 59 | Admitting: Family

## 2012-03-06 VITALS — BP 132/86 | HR 103 | Temp 98.9°F | Ht 68.0 in | Wt 145.0 lb

## 2012-03-06 DIAGNOSIS — Z0000 Encounter for general adult medical examination without abnormal findings: Secondary | ICD-10-CM

## 2012-03-06 DIAGNOSIS — Z720 Tobacco use: Secondary | ICD-10-CM

## 2012-03-06 DIAGNOSIS — Z136 Encounter for screening for cardiovascular disorders: Secondary | ICD-10-CM

## 2012-03-06 DIAGNOSIS — F17200 Nicotine dependence, unspecified, uncomplicated: Secondary | ICD-10-CM

## 2012-03-06 NOTE — Progress Notes (Signed)
  Subjective:    Patient ID: Patrick [name], male    DOB: Apr 23, 1950, 61 y.o.   MRN: [id number]  HPI Patient presents for yearly preventative medicine examination. All immunizations and health maintenance protocols were reviewed with the patient and they are up to date with these protocols. Screening laboratory values were reviewed with the patient including screening of hyperlipidemia PSA renal function and hepatic function. There medications past medical history social history problem list and allergies were reviewed in detail. Goals were established with regard to weight loss exercise diet in compliance with medications   Review of Systems  Constitutional: Negative.   HENT: Negative.   Eyes: Negative.   Respiratory: Negative.   Cardiovascular: Negative.   Gastrointestinal: Negative.   Genitourinary: Negative.   Musculoskeletal: Negative.   Skin: Negative.   Neurological: Negative.   Hematological: Negative.   Psychiatric/Behavioral: Negative.    Past Medical History  Diagnosis Date  . Depression   . Diverticulitis   . Kidney stone   . Colon polyps   . Diverticulosis of colon     History   Social History  . Marital Status: Divorced    Spouse Name: N/A    Number of Children: N/A  . Years of Education: N/A   Occupational History  . Not on file.   Social History Main Topics  . Smoking status: Current Every Day Smoker -- 0.5 packs/day    Types: Cigarettes  . Smokeless tobacco: Not on file  . Alcohol Use: 8.4 oz/week    14 Glasses of wine per week  . Drug Use: No  . Sexually Active: Not on file   Other Topics Concern  . Not on file   Social History Narrative  . No narrative on file    No past surgical history on file.  Family History  Problem Relation Age of Onset  . Heart disease Father 69    cabg    No Known Allergies  No current outpatient prescriptions on file prior to visit.    BP 132/86  Pulse 103  Temp 98.9 F (37.2 C) (Oral)  Ht 5\' 8"   (1.727 m)  Wt 145 lb (65.772 kg)  BMI 22.05 kg/m2  SpO2 98%chart    Objective:   Physical Exam  Constitutional: He is oriented to person, place, and time. He appears well-developed and well-nourished.  HENT:  Head: Normocephalic and atraumatic.  Right Ear: External ear normal.  Left Ear: External ear normal.  Nose: Nose normal.  Mouth/Throat: Oropharynx is clear and moist.  Eyes: Conjunctivae normal and EOM are normal. Pupils are equal, round, and reactive to light. Right eye exhibits no discharge.  Neck: Normal range of motion. Neck supple. No thyromegaly present.  Cardiovascular: Normal rate, regular rhythm and normal heart sounds.  Exam reveals no gallop and no friction rub.   No murmur heard. Pulmonary/Chest: Effort normal and breath sounds normal.  Abdominal: Soft. Bowel sounds are normal.  Genitourinary: Rectum normal, prostate normal and penis normal. Guaiac negative stool. No penile tenderness.  Musculoskeletal: Normal range of motion.  Neurological: He is alert and oriented to person, place, and time. He has normal reflexes.  Skin: Skin is warm and dry.  Psychiatric: He has a normal mood and affect.          Assessment & Plan:  Assessment: Complete physical exam  Plan: Encouraged healthy diet, exercise, smoking cessation. We'll follow with patient in one year, and sooner as needed.

## 2012-05-17 ENCOUNTER — Ambulatory Visit (INDEPENDENT_AMBULATORY_CARE_PROVIDER_SITE_OTHER): Payer: 59 | Admitting: Family Medicine

## 2012-05-17 ENCOUNTER — Encounter: Payer: Self-pay | Admitting: Family Medicine

## 2012-05-17 VITALS — BP 162/110 | HR 112 | Temp 98.7°F | Wt 139.0 lb

## 2012-05-17 DIAGNOSIS — I10 Essential (primary) hypertension: Secondary | ICD-10-CM

## 2012-05-17 LAB — CBC WITH DIFFERENTIAL/PLATELET
Basophils Absolute: 0 10*3/uL (ref 0.0–0.1)
Eosinophils Absolute: 0.1 10*3/uL (ref 0.0–0.7)
HCT: 45.5 % (ref 39.0–52.0)
Lymphocytes Relative: 23.6 % (ref 12.0–46.0)
Lymphs Abs: 1.8 10*3/uL (ref 0.7–4.0)
Monocytes Relative: 11.3 % (ref 3.0–12.0)
Platelets: 232 10*3/uL (ref 150.0–400.0)
RDW: 13.4 % (ref 11.5–14.6)

## 2012-05-17 LAB — BASIC METABOLIC PANEL
BUN: 8 mg/dL (ref 6–23)
Creatinine, Ser: 0.8 mg/dL (ref 0.4–1.5)
GFR: 108.14 mL/min (ref >60.00)

## 2012-05-17 LAB — TSH: TSH: 1.53 u[IU]/mL (ref 0.35–5.50)

## 2012-05-17 MED ORDER — METOPROLOL SUCCINATE ER 50 MG PO TB24: 50.0000 mg | ORAL_TABLET | Freq: Every day | ORAL | Status: DC

## 2012-05-17 NOTE — Progress Notes (Signed)
  Subjective:    Patient ID: Patrick [name], male    DOB: [birth year], 62 y.o.   MRN: [id number]  HPI Here at the request of [facility] to check his BP. He was seen there yesterday and was told his BP was high in the 140s over 90s. They said his vision had deteriorated rapidly since his last exam, and they were concerned that some medical condition has caused this. He had a cpx here just 3 months ago, and his exam and BP and labs were all normal at that time. I do see an elevated BP here during a visit in December 2012 which was 142/102. He feels fine except for occasional mild HAs. No chest pain or SOB. He takes no medications. He does smoke cigarettes.    Review of Systems  Constitutional: Negative.   Eyes: Positive for visual disturbance.  Respiratory: Negative.   Cardiovascular: Negative.   Neurological: Positive for headaches.       Objective:   Physical Exam  Constitutional: He is oriented to person, place, and time.       Thin, alert. He is very shaky and seems quite anxious   Eyes: Conjunctivae normal are normal. Pupils are equal, round, and reactive to light.  Neck: No thyromegaly present.  Cardiovascular: Regular rhythm, normal heart sounds and intact distal pulses.  Exam reveals no gallop and no friction rub.   No murmur heard.      Very rapid rate   Pulmonary/Chest: Effort normal and breath sounds normal. No respiratory distress. He has no wheezes. He has no rales.  Lymphadenopathy:    He has no cervical adenopathy.  Neurological: He is alert and oriented to person, place, and time.  Psychiatric: His behavior is normal. Thought content normal.       anxious          Assessment & Plan:  New onset HTN. Start on Metoprolol succinate. Get labs today to rule out hyperthyroidism, etc. Recheck with Dr. Cato Mulligan in 3 weeks

## 2012-05-21 NOTE — Progress Notes (Signed)
Quick Note:  I left voice message with results. ______ 

## 2012-06-14 ENCOUNTER — Ambulatory Visit: Payer: 59 | Admitting: Internal Medicine

## 2012-07-19 ENCOUNTER — Ambulatory Visit (INDEPENDENT_AMBULATORY_CARE_PROVIDER_SITE_OTHER): Payer: 59 | Admitting: Internal Medicine

## 2012-07-19 ENCOUNTER — Encounter: Payer: Self-pay | Admitting: Internal Medicine

## 2012-07-19 VITALS — BP 124/92 | HR 76 | Temp 98.4°F | Ht 68.0 in | Wt 141.0 lb

## 2012-07-19 DIAGNOSIS — I10 Essential (primary) hypertension: Secondary | ICD-10-CM

## 2012-07-19 MED ORDER — METOPROLOL SUCCINATE ER 50 MG PO TB24: 50.0000 mg | ORAL_TABLET | Freq: Every day | ORAL | Status: DC

## 2012-07-19 NOTE — Assessment & Plan Note (Signed)
Well controlled now  See me 6 months

## 2012-07-19 NOTE — Progress Notes (Signed)
Patient ID: Patrick [name], male   DOB: April 13, 1950, 62 y.o.   MRN: [id number] bp at optometrist-- elevated.  Saw dr fry Rx for metoprolol- no home BPs, no side effects  Reviewed pmh, psh, sochx  Ros-- no concerns  Exam- reviewed vitals Chest CTA CV- Reg Rate

## 2013-02-21 ENCOUNTER — Telehealth: Payer: Self-pay | Admitting: Internal Medicine

## 2013-02-21 NOTE — Telephone Encounter (Signed)
Ok per Dr Swords 

## 2013-02-21 NOTE — Telephone Encounter (Signed)
OK 

## 2013-02-21 NOTE — Telephone Encounter (Signed)
Pt would like to switch to dr Caryl Never. Can I sch?

## 2013-02-25 NOTE — Telephone Encounter (Signed)
lmom for pt to call back

## 2013-02-26 NOTE — Telephone Encounter (Signed)
appt has been sch °

## 2013-03-20 ENCOUNTER — Other Ambulatory Visit (INDEPENDENT_AMBULATORY_CARE_PROVIDER_SITE_OTHER): Payer: 59

## 2013-03-20 DIAGNOSIS — Z0000 Encounter for general adult medical examination without abnormal findings: Secondary | ICD-10-CM

## 2013-03-20 LAB — POCT URINALYSIS DIPSTICK
Bilirubin, UA: NEGATIVE
Glucose, UA: NEGATIVE
Ketones, UA: NEGATIVE
Leukocytes, UA: NEGATIVE
Protein, UA: NEGATIVE
Urobilinogen, UA: 0.2

## 2013-03-20 LAB — CBC WITH DIFFERENTIAL/PLATELET
Basophils Absolute: 0 10*3/uL (ref 0.0–0.1)
Eosinophils Absolute: 0.2 10*3/uL (ref 0.0–0.7)
Eosinophils Relative: 3.8 % (ref 0.0–5.0)
Hemoglobin: 13.8 g/dL (ref 13.0–17.0)
Lymphocytes Relative: 48.3 % — ABNORMAL HIGH (ref 12.0–46.0)
MCHC: 33.5 g/dL (ref 30.0–36.0)
Neutro Abs: 1.3 10*3/uL — ABNORMAL LOW (ref 1.4–7.7)
Neutrophils Relative %: 29 % — ABNORMAL LOW (ref 43.0–77.0)
Platelets: 193 10*3/uL (ref 150.0–400.0)
RDW: 13.8 % (ref 11.5–14.6)

## 2013-03-20 LAB — BASIC METABOLIC PANEL
CO2: 33 mEq/L — ABNORMAL HIGH (ref 19–32)
Calcium: 9.4 mg/dL (ref 8.4–10.5)
Sodium: 142 mEq/L (ref 135–145)

## 2013-03-20 LAB — TSH: TSH: 1.93 u[IU]/mL (ref 0.35–5.50)

## 2013-03-20 LAB — HEPATIC FUNCTION PANEL
ALT: 42 U/L (ref 0–53)
Albumin: 3.9 g/dL (ref 3.5–5.2)
Alkaline Phosphatase: 56 U/L (ref 39–117)
Bilirubin, Direct: 0.2 mg/dL (ref 0.0–0.3)
Total Bilirubin: 0.8 mg/dL (ref 0.3–1.2)

## 2013-03-20 LAB — LIPID PANEL
Cholesterol: 142 mg/dL (ref 0–200)
Total CHOL/HDL Ratio: 2

## 2013-03-27 ENCOUNTER — Ambulatory Visit (INDEPENDENT_AMBULATORY_CARE_PROVIDER_SITE_OTHER): Payer: 59 | Admitting: Family Medicine

## 2013-03-27 ENCOUNTER — Encounter: Payer: Self-pay | Admitting: Family Medicine

## 2013-03-27 VITALS — BP 130/80 | HR 87 | Temp 99.1°F | Ht 68.0 in | Wt 142.0 lb

## 2013-03-27 DIAGNOSIS — Z0000 Encounter for general adult medical examination without abnormal findings: Secondary | ICD-10-CM

## 2013-03-27 DIAGNOSIS — Z23 Encounter for immunization: Secondary | ICD-10-CM

## 2013-03-27 DIAGNOSIS — G250 Essential tremor: Secondary | ICD-10-CM | POA: Insufficient documentation

## 2013-03-27 DIAGNOSIS — R160 Hepatomegaly, not elsewhere classified: Secondary | ICD-10-CM

## 2013-03-27 NOTE — Progress Notes (Signed)
Pre visit review using our clinic review tool, if applicable. No additional management support is needed unless otherwise documented below in the visit note. 

## 2013-03-27 NOTE — Progress Notes (Signed)
Subjective:    Patient ID: Patrick [name], male    DOB: Oct 13, 1950, 62 y.o.   MRN: [id number]  HPI Patient here for complete physical. He has history of hypertension treated with metoprolol. Probable essential tremor though never diagnosed. Ongoing nicotine use. Smokes one pack per day. History reported diverticulosis: Without history of bleeding. He has had some chronic elevated liver transaminases with AST elevation. He drinks gin about 2-3 drinks per day.  Colonoscopy couple years ago. Tetanus up-to-date. Declines flu vaccine. No history of Pneumovax. 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 He is divorced and has 2 sons.  He's had some chronic upper extremity tremors which are worsened with intention. No known family history of essential tremor. This have been relatively stable. They are diminished with alcohol use.  Past Medical History  Diagnosis Date  . Depression   . Diverticulitis   . Kidney stone   . Colon polyps   . Diverticulosis of colon    No past surgical history on file.  reports that he has been smoking Cigarettes.  He has been smoking about 1.00 pack per day. He has never used smokeless tobacco. He reports that he drinks about 8.4 ounces of alcohol per week. He reports that he does not use illicit drugs. family history includes Heart disease (age of onset: 42) in his father. No Known Allergies    Review of Systems  Constitutional: Negative for fever, activity change, appetite change and fatigue.  HENT: Negative for congestion, ear pain and trouble swallowing.   Eyes: Negative for pain and visual disturbance.  Respiratory: Negative for cough, shortness of breath and wheezing.   Cardiovascular: Negative for chest pain and palpitations.  Gastrointestinal: Negative for nausea, vomiting, abdominal pain, diarrhea, constipation, blood in stool, abdominal distention and rectal pain.  Endocrine: Negative for polydipsia and polyuria.  Genitourinary:  Negative for dysuria, hematuria and testicular pain.  Musculoskeletal: Negative for arthralgias and joint swelling.  Skin: Negative for rash.  Neurological: Positive for tremors. Negative for dizziness, syncope and headaches.  Hematological: Negative for adenopathy.  Psychiatric/Behavioral: Negative for confusion and dysphoric mood.       Objective:   Physical Exam  Constitutional: He is oriented to person, place, and time. He appears well-developed and well-nourished. No distress.  HENT:  Head: Normocephalic and atraumatic.  Right Ear: External ear normal.  Left Ear: External ear normal.  Mouth/Throat: Oropharynx is clear and moist.  Eyes: Conjunctivae and EOM are normal. Pupils are equal, round, and reactive to light.  Neck: Normal range of motion. Neck supple. No thyromegaly present.  Cardiovascular: Normal rate, regular rhythm and normal heart sounds.   No murmur heard. Pulmonary/Chest: No respiratory distress. He has no wheezes. He has no rales.  Abdominal: Soft. Bowel sounds are normal. He exhibits no distension. There is no tenderness. There is no rebound and no guarding.  Patient has hepatomegaly palpated about 8-10 cm below rib margin  Musculoskeletal: He exhibits no edema.  Lymphadenopathy:    He has no cervical adenopathy.  Neurological: He is alert and oriented to person, place, and time. He displays normal reflexes. No cranial nerve deficit.  Patient has tremors involving mostly upper extremities and these exacerbate with intention. No cogwheel rigidity. No bradykinesia  Skin: No rash noted.  Psychiatric: He has a normal mood and affect.          Assessment & Plan:  Complete physical. Discussed smoking cessation. Labs reveal elevated AST likely related to excessive alcohol consumption.  He has physical finding of hepatomegaly which is also likely related to his alcohol. He does describe some recent mild weight loss but over about 3 years. We strongly advise alcohol  abstinence. Check abdominal ultrasound. Pneumovax given. he declines flu vaccine. Check on coverage for shingles vaccine.  Upper extremity tremor. By history and exam, most likely essential tremor (worse with intention, better after ETOH, no rigidity, etc) continue with metoprolol. Discussed possible referral to neurology if worsens.

## 2013-03-27 NOTE — Patient Instructions (Signed)
Try to quit smoking. Reduce or preferably aluminate alcohol consumption. We'll call you regarding abdominal ultrasound. Check on insurance coverage for shingles vaccine.    Smoking Cessation Quitting smoking is important to your health and has many advantages. However, it is not always easy to quit since nicotine is a very addictive drug. Often times, people try 3 times or more before being able to quit. This document explains the best ways for you to prepare to quit smoking. Quitting takes hard work and a lot of effort, but you can do it. ADVANTAGES OF QUITTING SMOKING  You will live longer, feel better, and live better.  Your body will feel the impact of quitting smoking almost immediately.  Within 20 minutes, blood pressure decreases. Your pulse returns to its normal level.  After 8 hours, carbon monoxide levels in the blood return to normal. Your oxygen level increases.  After 24 hours, the chance of having a heart attack starts to decrease. Your breath, hair, and body stop smelling like smoke.  After 48 hours, damaged nerve endings begin to recover. Your sense of taste and smell improve.  After 72 hours, the body is virtually free of nicotine. Your bronchial tubes relax and breathing becomes easier.  After 2 to 12 weeks, lungs can hold more air. Exercise becomes easier and circulation improves.  The risk of having a heart attack, stroke, cancer, or lung disease is greatly reduced.  After 1 year, the risk of coronary heart disease is cut in half.  After 5 years, the risk of stroke falls to the same as a nonsmoker.  After 10 years, the risk of lung cancer is cut in half and the risk of other cancers decreases significantly.  After 15 years, the risk of coronary heart disease drops, usually to the level of a nonsmoker.  If you are pregnant, quitting smoking will improve your chances of having a healthy baby.  The people you live with, especially any children, will be  healthier.  You will have extra money to spend on things other than cigarettes. QUESTIONS TO THINK ABOUT BEFORE ATTEMPTING TO QUIT You may want to talk about your answers with your caregiver.  Why do you want to quit?  If you tried to quit in the past, what helped and what did not?  What will be the most difficult situations for you after you quit? How will you plan to handle them?  Who can help you through the tough times? Your family? Friends? A caregiver?  What pleasures do you get from smoking? What ways can you still get pleasure if you quit? Here are some questions to ask your caregiver:  How can you help me to be successful at quitting?  What medicine do you think would be best for me and how should I take it?  What should I do if I need more help?  What is smoking withdrawal like? How can I get information on withdrawal? GET READY  Set a quit date.  Change your environment by getting rid of all cigarettes, ashtrays, matches, and lighters in your home, car, or work. Do not let people smoke in your home.  Review your past attempts to quit. Think about what worked and what did not. GET SUPPORT AND ENCOURAGEMENT You have a better chance of being successful if you have help. You can get support in many ways.  Tell your family, friends, and co-workers that you are going to quit and need their support. Ask them not to smoke  around you.  Get individual, group, or telephone counseling and support. Programs are available at [employer] and health centers. Call your local health department for information about programs in your area.  Spiritual beliefs and practices may help some smokers quit.  Download a "quit meter" on your computer to keep track of quit statistics, such as how long you have gone without smoking, cigarettes not smoked, and money saved.  Get a self-help book about quitting smoking and staying off of tobacco. LEARN NEW SKILLS AND BEHAVIORS  Distract  yourself from urges to smoke. Talk to someone, go for a walk, or occupy your time with a task.  Change your normal routine. Take a different route to work. Drink tea instead of coffee. Eat breakfast in a different place.  Reduce your stress. Take a hot bath, exercise, or read a book.  Plan something enjoyable to do every day. Reward yourself for not smoking.  Explore interactive web-based programs that specialize in helping you quit. GET MEDICINE AND USE IT CORRECTLY Medicines can help you stop smoking and decrease the urge to smoke. Combining medicine with the above behavioral methods and support can greatly increase your chances of successfully quitting smoking.  Nicotine replacement therapy helps deliver nicotine to your body without the negative effects and risks of smoking. Nicotine replacement therapy includes nicotine gum, lozenges, inhalers, nasal sprays, and skin patches. Some may be available over-the-counter and others require a prescription.  Antidepressant medicine helps people abstain from smoking, but how this works is unknown. This medicine is available by prescription.  Nicotinic receptor partial agonist medicine simulates the effect of nicotine in your brain. This medicine is available by prescription. Ask your caregiver for advice about which medicines to use and how to use them based on your health history. Your caregiver will tell you what side effects to look out for if you choose to be on a medicine or therapy. Carefully read the information on the package. Do not use any other product containing nicotine while using a nicotine replacement product.  RELAPSE OR DIFFICULT SITUATIONS Most relapses occur within the first 3 months after quitting. Do not be discouraged if you start smoking again. Remember, most people try several times before finally quitting. You may have symptoms of withdrawal because your body is used to nicotine. You may crave cigarettes, be irritable, feel  very hungry, cough often, get headaches, or have difficulty concentrating. The withdrawal symptoms are only temporary. They are strongest when you first quit, but they will go away within 10 14 days. To reduce the chances of relapse, try to:  Avoid drinking alcohol. Drinking lowers your chances of successfully quitting.  Reduce the amount of caffeine you consume. Once you quit smoking, the amount of caffeine in your body increases and can give you symptoms, such as a rapid heartbeat, sweating, and anxiety.  Avoid smokers because they can make you want to smoke.  Do not let weight gain distract you. Many smokers will gain weight when they quit, usually less than 10 pounds. Eat a healthy diet and stay active. You can always lose the weight gained after you quit.  Find ways to improve your mood other than smoking. FOR MORE INFORMATION  www.smokefree.gov  Document Released: 03/21/2001 Document Revised: 09/26/2011 Document Reviewed: 07/06/2011 [facility] Patient Information 2014 [place], [place].

## 2013-04-16 ENCOUNTER — Ambulatory Visit
Admission: RE | Admit: 2013-04-16 | Discharge: 2013-04-16 | Disposition: A | Discharge status: Home / Self Care | Payer: 59 | Source: Ambulatory Visit | Attending: Family Medicine | Admitting: Family Medicine

## 2013-04-16 DIAGNOSIS — R160 Hepatomegaly, not elsewhere classified: Secondary | ICD-10-CM

## 2013-04-17 ENCOUNTER — Telehealth: Payer: Self-pay | Admitting: Family Medicine

## 2013-04-17 NOTE — Telephone Encounter (Signed)
Pt informed

## 2013-04-17 NOTE — Telephone Encounter (Signed)
Pt returning your call about ultra sound results

## 2013-07-30 ENCOUNTER — Other Ambulatory Visit: Payer: Self-pay | Admitting: Internal Medicine

## 2013-11-13 ENCOUNTER — Other Ambulatory Visit: Payer: Self-pay | Admitting: Internal Medicine

## 2013-11-20 ENCOUNTER — Encounter: Payer: Self-pay | Admitting: Gastroenterology

## 2014-03-11 ENCOUNTER — Other Ambulatory Visit (INDEPENDENT_AMBULATORY_CARE_PROVIDER_SITE_OTHER): Payer: 59

## 2014-03-11 DIAGNOSIS — Z0000 Encounter for general adult medical examination without abnormal findings: Secondary | ICD-10-CM

## 2014-03-11 LAB — POCT URINALYSIS DIPSTICK
Bilirubin, UA: NEGATIVE
Blood, UA: NEGATIVE
Glucose, UA: NEGATIVE
KETONES UA: NEGATIVE
Leukocytes, UA: NEGATIVE
Nitrite, UA: NEGATIVE
PH UA: 5.5
PROTEIN UA: NEGATIVE
SPEC GRAV UA: 1.01
Urobilinogen, UA: 0.2

## 2014-03-11 LAB — COMPREHENSIVE METABOLIC PANEL
ALK PHOS: 66 U/L (ref 39–117)
ALT: 29 U/L (ref 0–53)
AST: 51 U/L — ABNORMAL HIGH (ref 0–37)
Albumin: 4.2 g/dL (ref 3.5–5.2)
BILIRUBIN TOTAL: 0.8 mg/dL (ref 0.2–1.2)
BUN: 9 mg/dL (ref 6–23)
CO2: 26 mEq/L (ref 19–32)
Calcium: 9.6 mg/dL (ref 8.4–10.5)
Chloride: 101 mEq/L (ref 96–112)
Creatinine, Ser: 0.7 mg/dL (ref 0.4–1.5)
GFR: 116.02 mL/min (ref >60.00)
Glucose, Bld: 91 mg/dL (ref 70–99)
Potassium: 3.7 mEq/L (ref 3.5–5.1)
SODIUM: 137 meq/L (ref 135–145)
Total Protein: 7.1 g/dL (ref 6.0–8.3)

## 2014-03-11 LAB — LIPID PANEL
CHOLESTEROL: 149 mg/dL (ref 0–200)
HDL: 62.9 mg/dL (ref >39.00)
LDL CALC: 78 mg/dL (ref 0–99)
NonHDL: 86.1
TRIGLYCERIDES: 42 mg/dL (ref 0.0–149.0)
Total CHOL/HDL Ratio: 2
VLDL: 8.4 mg/dL (ref 0.0–40.0)

## 2014-03-11 LAB — CBC WITH DIFFERENTIAL/PLATELET
BASOS PCT: 1.1 % (ref 0.0–3.0)
Basophils Absolute: 0.1 10*3/uL (ref 0.0–0.1)
EOS PCT: 2.7 % (ref 0.0–5.0)
Eosinophils Absolute: 0.2 10*3/uL (ref 0.0–0.7)
HEMATOCRIT: 43.1 % (ref 39.0–52.0)
Hemoglobin: 14.4 g/dL (ref 13.0–17.0)
LYMPHS ABS: 2.2 10*3/uL (ref 0.7–4.0)
Lymphocytes Relative: 39.6 % (ref 12.0–46.0)
MCHC: 33.4 g/dL (ref 30.0–36.0)
MCV: 94.3 fl (ref 78.0–100.0)
MONO ABS: 0.8 10*3/uL (ref 0.1–1.0)
Monocytes Relative: 14.3 % — ABNORMAL HIGH (ref 3.0–12.0)
Neutro Abs: 2.4 10*3/uL (ref 1.4–7.7)
Neutrophils Relative %: 42.3 % — ABNORMAL LOW (ref 43.0–77.0)
PLATELETS: 174 10*3/uL (ref 150.0–400.0)
RBC: 4.57 Mil/uL (ref 4.22–5.81)
RDW: 14.1 % (ref 11.5–15.5)
WBC: 5.7 10*3/uL (ref 4.0–10.5)

## 2014-03-11 LAB — PSA: PSA: 0.5 ng/mL (ref 0.10–4.00)

## 2014-03-11 LAB — TSH: TSH: 2.58 u[IU]/mL (ref 0.35–4.50)

## 2014-03-18 ENCOUNTER — Ambulatory Visit (INDEPENDENT_AMBULATORY_CARE_PROVIDER_SITE_OTHER): Payer: 59 | Admitting: Family Medicine

## 2014-03-18 ENCOUNTER — Encounter: Payer: Self-pay | Admitting: Family Medicine

## 2014-03-18 VITALS — BP 136/90 | HR 84 | Temp 98.2°F | Ht 67.5 in | Wt 147.0 lb

## 2014-03-18 DIAGNOSIS — Z0000 Encounter for general adult medical examination without abnormal findings: Secondary | ICD-10-CM

## 2014-03-18 MED ORDER — METOPROLOL SUCCINATE ER 50 MG PO TB24: 50.0000 mg | ORAL_TABLET | Freq: Every day | ORAL | Status: DC

## 2014-03-18 NOTE — Progress Notes (Signed)
Pre visit review using our clinic review tool, if applicable. No additional management support is needed unless otherwise documented below in the visit note. 

## 2014-03-18 NOTE — Patient Instructions (Signed)
How Much is Too Much Alcohol? Drinking too much alcohol can cause injury, accidents, and health problems. These types of problems can include:   Car crashes.  Falls.  Family fighting (domestic violence).  Drowning.  Fights.  Injuries.  Burns.  Damage to certain organs.  Having a baby with birth defects. ONE DRINK CAN BE TOO MUCH WHEN YOU ARE:  Working.  Pregnant or breastfeeding.  Taking medicines. Ask your doctor.  Driving or planning to drive. WHAT IS A STANDARD DRINK?   1 regular beer (12 ounces or 360 milliliters).  1 glass of wine (5 ounces or 150 milliliters).  1 shot of liquor (1.5 ounces or 45 milliliters). BLOOD ALCOHOL LEVELS   .00 A person is sober.  Marland Kitchen03 A person has no trouble keeping balance, talking, or seeing right, but a "buzz" may be felt.  Marland Kitchen05 A person feels "buzzed" and relaxed.  Marland Kitchen08 or .10  A person is drunk. He or she has trouble talking, seeing right, and keeping his or her balance.  .15 A person loses body control and may pass out (blackout).  .20 A person has trouble walking (staggering) and throws up (vomits).  .30 A person will pass out (unconscious).  .40+ A person will be in a coma. Death is possible. If you or someone you know has a drinking problem, get help from a doctor.  Document Released: 01/21/2009 Document Revised: 06/19/2011 Document Reviewed: 01/21/2009 [facility] Patient Information 2015 [place], [place]. This information is not intended to replace advice given to you by your health care provider. Make sure you discuss any questions you have with your health care provider. Smoking Cessation Quitting smoking is important to your health and has many advantages. However, it is not always easy to quit since nicotine is a very addictive drug. Oftentimes, people try 3 times or more before being able to quit. This document explains the best ways for you to prepare to quit smoking. Quitting takes hard work and a lot of effort, but  you can do it. ADVANTAGES OF QUITTING SMOKING  You will live longer, feel better, and live better.  Your body will feel the impact of quitting smoking almost immediately.  Within 20 minutes, blood pressure decreases. Your pulse returns to its normal level.  After 8 hours, carbon monoxide levels in the blood return to normal. Your oxygen level increases.  After 24 hours, the chance of having a heart attack starts to decrease. Your breath, hair, and body stop smelling like smoke.  After 48 hours, damaged nerve endings begin to recover. Your sense of taste and smell improve.  After 72 hours, the body is virtually free of nicotine. Your bronchial tubes relax and breathing becomes easier.  After 2 to 12 weeks, lungs can hold more air. Exercise becomes easier and circulation improves.  The risk of having a heart attack, stroke, cancer, or lung disease is greatly reduced.  After 1 year, the risk of coronary heart disease is cut in half.  After 5 years, the risk of stroke falls to the same as a nonsmoker.  After 10 years, the risk of lung cancer is cut in half and the risk of other cancers decreases significantly.  After 15 years, the risk of coronary heart disease drops, usually to the level of a nonsmoker.  If you are pregnant, quitting smoking will improve your chances of having a healthy baby.  The people you live with, especially any children, will be healthier.  You will have extra money to  spend on things other than cigarettes. QUESTIONS TO THINK ABOUT BEFORE ATTEMPTING TO QUIT You may want to talk about your answers with your health care provider.  Why do you want to quit?  If you tried to quit in the past, what helped and what did not?  What will be the most difficult situations for you after you quit? How will you plan to handle them?  Who can help you through the tough times? Your family? Friends? A health care provider?  What pleasures do you get from smoking? What  ways can you still get pleasure if you quit? Here are some questions to ask your health care provider:  How can you help me to be successful at quitting?  What medicine do you think would be best for me and how should I take it?  What should I do if I need more help?  What is smoking withdrawal like? How can I get information on withdrawal? GET READY  Set a quit date.  Change your environment by getting rid of all cigarettes, ashtrays, matches, and lighters in your home, car, or work. Do not let people smoke in your home.  Review your past attempts to quit. Think about what worked and what did not. GET SUPPORT AND ENCOURAGEMENT You have a better chance of being successful if you have help. You can get support in many ways.  Tell your family, friends, and coworkers that you are going to quit and need their support. Ask them not to smoke around you.  Get individual, group, or telephone counseling and support. Programs are available at [employer] and health centers. Call your local health department for information about programs in your area.  Spiritual beliefs and practices may help some smokers quit.  Download a "quit meter" on your computer to keep track of quit statistics, such as how long you have gone without smoking, cigarettes not smoked, and money saved.  Get a self-help book about quitting smoking and staying off tobacco. Redgranite yourself from urges to smoke. Talk to someone, go for a walk, or occupy your time with a task.  Change your normal routine. Take a different route to work. Drink tea instead of coffee. Eat breakfast in a different place.  Reduce your stress. Take a hot bath, exercise, or read a book.  Plan something enjoyable to do every day. Reward yourself for not smoking.  Explore interactive web-based programs that specialize in helping you quit. GET MEDICINE AND USE IT CORRECTLY Medicines can help you stop smoking  and decrease the urge to smoke. Combining medicine with the above behavioral methods and support can greatly increase your chances of successfully quitting smoking.  Nicotine replacement therapy helps deliver nicotine to your body without the negative effects and risks of smoking. Nicotine replacement therapy includes nicotine gum, lozenges, inhalers, nasal sprays, and skin patches. Some may be available over-the-counter and others require a prescription.  Antidepressant medicine helps people abstain from smoking, but how this works is unknown. This medicine is available by prescription.  Nicotinic receptor partial agonist medicine simulates the effect of nicotine in your brain. This medicine is available by prescription. Ask your health care provider for advice about which medicines to use and how to use them based on your health history. Your health care provider will tell you what side effects to look out for if you choose to be on a medicine or therapy. Carefully read the information on the package. Do  not use any other product containing nicotine while using a nicotine replacement product.  RELAPSE OR DIFFICULT SITUATIONS Most relapses occur within the first 3 months after quitting. Do not be discouraged if you start smoking again. Remember, most people try several times before finally quitting. You may have symptoms of withdrawal because your body is used to nicotine. You may crave cigarettes, be irritable, feel very hungry, cough often, get headaches, or have difficulty concentrating. The withdrawal symptoms are only temporary. They are strongest when you first quit, but they will go away within 10-14 days. To reduce the chances of relapse, try to:  Avoid drinking alcohol. Drinking lowers your chances of successfully quitting.  Reduce the amount of caffeine you consume. Once you quit smoking, the amount of caffeine in your body increases and can give you symptoms, such as a rapid heartbeat,  sweating, and anxiety.  Avoid smokers because they can make you want to smoke.  Do not let weight gain distract you. Many smokers will gain weight when they quit, usually less than 10 pounds. Eat a healthy diet and stay active. You can always lose the weight gained after you quit.  Find ways to improve your mood other than smoking. FOR MORE INFORMATION  www.smokefree.gov  Document Released: 03/21/2001 Document Revised: 08/11/2013 Document Reviewed: 07/06/2011 [facility] Patient Information 2015 [place], [place]. This information is not intended to replace advice given to you by your health care provider. Make sure you discuss any questions you have with your health care provider.

## 2014-03-18 NOTE — Progress Notes (Signed)
Subjective:    Patient ID: Patrick [name], male    DOB: 20-Nov-1950, 63 y.o.   MRN: [id number]  HPI  Patient seen for complete physical. He has history of alcohol abuse and ongoing nicotine use. He has essential tremor takes low-dose metoprolol. He is complaining of some general fatigue issues which have gone for months if not years. No chest pains. No dyspnea. No chronic cough. Tremor unchanged. He has scaled alcohol back somewhat. Usually 2-3 drinks of gin per day about one and 1/2 ounces per drink. This is slightly scaled back from last year. He declines flu vaccine and shingles vaccine. Had pneumonia vaccine last year. Still works full-time.  Past Medical History  Diagnosis Date  . Depression   . Diverticulitis   . Kidney stone   . Colon polyps   . Diverticulosis of colon    No past surgical history on file.  reports that he has been smoking Cigarettes.  He has been smoking about 1.00 pack per day. He has never used smokeless tobacco. He reports that he drinks about 8.4 oz of alcohol per week. He reports that he does not use illicit drugs. family history includes Heart disease (age of onset: 49) in his father. No Known Allergies   Review of Systems  Constitutional: Positive for fatigue. Negative for fever, activity change and appetite change.  HENT: Negative for congestion, ear pain and trouble swallowing.   Eyes: Negative for pain and visual disturbance.  Respiratory: Negative for cough, shortness of breath and wheezing.   Cardiovascular: Negative for chest pain and palpitations.  Gastrointestinal: Negative for nausea, vomiting, abdominal pain, diarrhea, constipation, blood in stool, abdominal distention and rectal pain.  Genitourinary: Negative for dysuria, hematuria and testicular pain.  Musculoskeletal: Negative for joint swelling and arthralgias.  Skin: Negative for rash.  Neurological: Positive for tremors. Negative for dizziness, syncope and headaches.  Hematological:  Negative for adenopathy.  Psychiatric/Behavioral: Negative for confusion and dysphoric mood.       Objective:   Physical Exam  Constitutional: He is oriented to person, place, and time. He appears well-developed and well-nourished. No distress.  HENT:  Head: Normocephalic and atraumatic.  Right Ear: External ear normal.  Left Ear: External ear normal.  Mouth/Throat: Oropharynx is clear and moist.  Eyes: Conjunctivae and EOM are normal. Pupils are equal, round, and reactive to light.  Neck: Normal range of motion. Neck supple. No thyromegaly present.  Cardiovascular: Normal rate, regular rhythm and normal heart sounds.   No murmur heard. Pulmonary/Chest: No respiratory distress. He has no wheezes. He has no rales.  Abdominal: Soft. Bowel sounds are normal. He exhibits no distension and no mass. There is no tenderness. There is no rebound and no guarding.  Musculoskeletal: He exhibits no edema.  Lymphadenopathy:    He has no cervical adenopathy.  Neurological: He is alert and oriented to person, place, and time. He displays normal reflexes. No cranial nerve deficit.  He has tremor mostly involving upper extremities which is worse with movement  Skin: No rash noted.  Psychiatric: He has a normal mood and affect.          Assessment & Plan:  Complete physical. He has significant risk including ongoing nicotine use and excessive alcohol use. We talked at some length about the importance of trying to abstain from alcohol together. He is reluctant to look at rehabilitation programs. We also discussed smoking cessation. He is trying to scale back. We recommended flu vaccine and he declines. He  also declines shingles vaccine. He is aware of ongoing risk of alcohol including cirrhosis.  Regarding his essential tremor we discussed other possible treatment such as Mysoline and offered neurology referral. At this point wishes to wait.

## 2014-05-14 ENCOUNTER — Telehealth: Payer: Self-pay | Admitting: Family Medicine

## 2014-05-14 NOTE — Telephone Encounter (Signed)
Pt informed

## 2014-05-14 NOTE — Telephone Encounter (Signed)
Pt has decided to have the "wart" removed from his scalp near his ear. Pt states he was told at his appt in dec this could be done in the office by freezing it off..Pt wants to know if he should shave the area around the place or ok to leave like it is? Pt scheduled for monday

## 2014-05-14 NOTE — Telephone Encounter (Signed)
No need to shave area

## 2014-05-18 ENCOUNTER — Ambulatory Visit (INDEPENDENT_AMBULATORY_CARE_PROVIDER_SITE_OTHER): Payer: 59 | Admitting: Family Medicine

## 2014-05-18 ENCOUNTER — Encounter: Payer: Self-pay | Admitting: Family Medicine

## 2014-05-18 VITALS — BP 126/80 | HR 69 | Temp 98.2°F | Wt 149.0 lb

## 2014-05-18 DIAGNOSIS — L821 Other seborrheic keratosis: Secondary | ICD-10-CM

## 2014-05-18 NOTE — Patient Instructions (Signed)
Seborrheic Keratosis Seborrheic keratosis is a common, noncancerous (benign) skin growth that can occur anywhere on the skin.It looks like "stuck-on," waxy, rough, tan, brown, or black spots on the skin. These skin growths can be flat or raised.They are often called "barnacles" because of their pasted-on appearance.Usually, these skin growths appear in adulthood, around age 62, and increase in number as you age. They may also develop during pregnancy or following estrogen therapy. Many people may only have one growth appear in their lifetime, while some people may develop many growths. CAUSES It is unknown what causes these skin growths, but they appear to run in families. SYMPTOMS Seborrheic keratosis is often located on the face, chest, shoulders, back, or other areas. These growths are:  Usually painless, but may become irritated and itchy.  Yellow, brown, black, or other colors.  Slightly raised or have a flat surface.  Sometimes rough or wart-like in texture.  Often waxy on the surface.  Round or oval-shaped.  Sometimes "stuck-on" in appearance.  Sometimes single, but there are usually many growths. Any growth that bleeds, itches on a regular basis, becomes inflamed, or becomes irritated needs to be evaluated by a skin specialist (dermatologist). DIAGNOSIS Diagnosis is mainly based on the way the growths appear. In some cases, it can be difficult to tell this type of skin growth from skin cancer. A skin growth tissue sample (biopsy) may be used to confirm the diagnosis. TREATMENT Most often, treatment is not needed because the skin growths are benign.If the skin growth is irritated easily by clothing or jewelry, causing it to scab or bleed, treatment may be recommended. Patients may also choose to have the growths removed because they do not like their appearance. Most commonly, these growths are treated with cryosurgery. In cryosurgery, liquid nitrogen is applied to "freeze" the  growth. The growth usually falls off within a matter of days. A blister may form and dry into a scab that will also fall off. After the growth or scab falls off, it may leave a dark or light spot on the skin. This color may fade over time, or it may remain permanent on the skin. HOME CARE INSTRUCTIONS If the skin growths are treated with cryosurgery, the treated area needs to be kept clean with water and soap. SEEK MEDICAL CARE IF:  You have questions about these growths or other skin problems.  You develop new symptoms, including:  A change in the appearance of the skin growth.  New growths.  Any bleeding, itching, or pain in the growths.  A skin growth that looks similar to seborrheic keratosis. Document Released: 04/29/2010 Document Revised: 06/19/2011 Document Reviewed: 04/29/2010 [employer] Patient Information 2015 [employer]. This information is not intended to replace advice given to you by your health care provider. Make sure you discuss any questions you have with your health care provider.  

## 2014-05-18 NOTE — Progress Notes (Signed)
Pre visit review using our clinic review tool, if applicable. No additional management support is needed unless otherwise documented below in the visit note. 

## 2014-05-18 NOTE — Progress Notes (Signed)
   Subjective:    Patient ID: Patrick [name], male    DOB: 08/18/1950, 64 y.o.   MRN: [id number]  HPI Patient here to consider liquid nitrogen treatment to skin lesion right temporal scalp. He had noticed in the past and had requested in this be treated. Sometimes irritated because of brushing and location. He's not had any bleeding. No prior history of skin cancer. No rapid growth. No associated pain.  Past Medical History  Diagnosis Date  . Depression   . Diverticulitis   . Kidney stone   . Colon polyps   . Diverticulosis of colon    No past surgical history on file.  reports that he has been smoking Cigarettes.  He has been smoking about 1.00 pack per day. He has never used smokeless tobacco. He reports that he drinks about 8.4 oz of alcohol per week. He reports that he does not use illicit drugs. family history includes Heart disease (age of onset: 25) in his father. No Known Allergies    Review of Systems  Constitutional: Negative for fever, chills, appetite change and unexpected weight change.  Hematological: Negative for adenopathy.       Objective:   Physical Exam  Constitutional: He appears well-developed and well-nourished. No distress.  Skin:  Patient has approximate 10 mm slightly raised well-demarcated hyperkeratotic brownish colored lesion just above the right ear          Assessment & Plan:  Seborrheic keratosis/verrucous keratosis. We explained this appears benign. Patient requesting treatment. We reviewed options including shave excision versus liquid nitrogen and patient requesting the latter. Reviewed risk and benefits. Treated with liquid nitrogen without difficulty. Be in touch in 2-3 weeks if this is not resolving and sooner for any issues

## 2014-05-19 ENCOUNTER — Telehealth: Payer: Self-pay | Admitting: Family Medicine

## 2014-05-19 NOTE — Telephone Encounter (Signed)
emmi emailed °

## 2014-08-17 ENCOUNTER — Encounter: Payer: Self-pay | Admitting: Family Medicine

## 2014-08-17 ENCOUNTER — Ambulatory Visit (INDEPENDENT_AMBULATORY_CARE_PROVIDER_SITE_OTHER): Payer: 59 | Admitting: Family Medicine

## 2014-08-17 VITALS — BP 120/90 | HR 90 | Temp 98.2°F | Wt 141.0 lb

## 2014-08-17 DIAGNOSIS — R05 Cough: Secondary | ICD-10-CM

## 2014-08-17 DIAGNOSIS — R059 Cough, unspecified: Secondary | ICD-10-CM

## 2014-08-17 DIAGNOSIS — R062 Wheezing: Secondary | ICD-10-CM

## 2014-08-17 MED ORDER — DOXYCYCLINE HYCLATE 100 MG PO CAPS: 100.0000 mg | ORAL_CAPSULE | Freq: Two times a day (BID) | ORAL | Status: DC

## 2014-08-17 MED ORDER — PREDNISONE 20 MG PO TABS: ORAL_TABLET | ORAL | Status: DC

## 2014-08-17 MED ORDER — ALBUTEROL SULFATE HFA 108 (90 BASE) MCG/ACT IN AERS: 2.0000 | INHALATION_SPRAY | RESPIRATORY_TRACT | Status: AC | PRN

## 2014-08-17 NOTE — Progress Notes (Signed)
Pre visit review using our clinic review tool, if applicable. No additional management support is needed unless otherwise documented below in the visit note. 

## 2014-08-17 NOTE — Progress Notes (Signed)
   Subjective:    Patient ID: Patrick [name], male    DOB: 20-Jun-1950, 64 y.o.   MRN: [id number]  HPI Acute visit for cough. Onset last week. Patient is a smoker. Cough productive of occasional yellow sputum. He has some wheezing off and on. No documented history of COPD. No hemoptysis. No dyspnea. Minimal nasal congestion. Denies any pleuritic pain. No sore throat.  Past Medical History  Diagnosis Date  . Depression   . Diverticulitis   . Kidney stone   . Colon polyps   . Diverticulosis of colon    No past surgical history on file.  reports that he has been smoking Cigarettes.  He has been smoking about 1.00 pack per day. He has never used smokeless tobacco. He reports that he drinks about 8.4 oz of alcohol per week. He reports that he does not use illicit drugs. family history includes Heart disease (age of onset: 47) in his father. No Known Allergies    Review of Systems  Constitutional: Positive for fatigue. Negative for fever and chills.  HENT: Positive for congestion. Negative for sore throat.   Respiratory: Positive for cough and wheezing.   Cardiovascular: Negative for chest pain.       Objective:   Physical Exam  Constitutional: He appears well-developed and well-nourished.  HENT:  Right Ear: External ear normal.  Left Ear: External ear normal.  Mouth/Throat: Oropharynx is clear and moist.  Neck: Neck supple.  Cardiovascular: Normal rate and regular rhythm.   Pulmonary/Chest: Effort normal. No respiratory distress. He has wheezes. He has no rales.  Lymphadenopathy:    He has no cervical adenopathy.          Assessment & Plan:  Cough with reactive airway component. Start prednisone taper. Albuterol 2 puffs every 4 hours as needed for cough and wheeze. Doxycycline 100 mg twice daily for 10 days. Follow-up promptly for fever or increased shortness of breath

## 2014-09-08 ENCOUNTER — Other Ambulatory Visit: Payer: Self-pay | Admitting: Family Medicine

## 2015-01-11 ENCOUNTER — Telehealth: Payer: Self-pay | Admitting: Family Medicine

## 2015-01-11 NOTE — Telephone Encounter (Signed)
[facility] [place]    --------------------------------------------------------------------------------   Patient Name: Patrick [name]  Gender: Male  DOB: Mar 13, 1951   Age: 64 Y 75 D  Return Phone Number: [phone] (Primary), [phone] (Secondary)  Address: declined     City/State/Zip:  [place]     Client [facility] - Client  Client Site [place] - Day  Physician Carolann Littler   Contact Type Call  Call Type Triage / Clinical  Relationship To Patient Self  Return Phone Number [phone] (Primary)  Chief Complaint VOMITING - Blood  Initial Comment Caller states vomited blood yesterday but not vomiting today wanting appt to be seen   PreDisposition Call Doctor       Nurse Assessment  Nurse: Amalia Hailey, RN, Melissa Date/Time (Eastern Time): 01/11/2015 4:07:21 PM  Confirm and document reason for call. If symptomatic, describe symptoms. ---Caller states vomited blood yesterday but not vomiting today wanting appt to be seen    Has the patient traveled out of the country within the last 30 days? ---Not Applicable    Does the patient have any new or worsening symptoms? ---Yes    Will a triage be completed? ---Yes    Related visit to physician within the last 2 weeks? ---No    Does the PT have any chronic conditions? (i.e. diabetes, asthma, etc.) ---Yes    List chronic conditions. ---hypertension           Guidelines          Guideline Title Affirmed Question Affirmed Notes Nurse Date/Time (Eastern Time)  Vomiting Blood [1] Vomited blood AND [2] more than a few streaks of blood (Exception: few streaks and only occurred once) (Exception: swallowed blood from a nosebleed or cut in the mouth)    Evans, RN, Lenna Sciara 01/11/2015 4:08:48 PM    Disp. Time Eilene Ghazi Time) Disposition Final User    01/11/2015 4:01:30 PM Send to Urgent Queue   [facility] Register       01/11/2015 4:10:54 PM Go to ED Now Yes Amalia Hailey, RN, Lenna Sciara            Caller Understands: Yes  Disagree/Comply: Comply       Care Advice Given Per Guideline        GO TO ED NOW: You need to be seen in the Emergency Department. Go to the ER at ___________ [place] now. Drive carefully. DRIVING: Another adult should drive. BRING MEDICINES: * It is also a good idea to bring the pill bottles too. This will help the doctor to make certain you are taking the right medicines and the right dose.    After Care Instructions Given        Call Event Type User Date / Time Description        --------------------------------------------------------------------------------         Comments  User: [name], RN Date/Time (Eastern Time): 01/11/2015 4:17:45 PM  Caller reports he will not go to ED, after giving him the outcome recommendation. Caller informed will notify the office of the outcome and his refusal to go to ED, asking for appt. to be seen. Called the back office line and spoke to [place] regarding this information and informed the pt reports he has been vomiting since yesterday brown liquid then black yesterday, more then a tsp but not quite a cup ful. Triage outcome to go to ED.    Referrals  GO TO FACILITY REFUSED

## 2015-01-11 NOTE — Telephone Encounter (Signed)
Spoke to patient. Patient states he feels much better today then yesterday and he has not had any further episodes of vomiting blood. Patient does state he has abdominal cramping and intermittent nausea. Appointment is scheduled for 9:45 am tomorrow. Patient states he feels that is appropriate as he is is feeling much better and denies dizziness, chest pain, fatigue, or light-headedness. Dr. Elease Hashimoto aware and agrees on plan. Patient also verbalized understanding if bleeding was to reoccur or symptoms arise to go to the ED

## 2015-01-12 ENCOUNTER — Telehealth: Payer: Self-pay | Admitting: Gastroenterology

## 2015-01-12 ENCOUNTER — Encounter: Payer: Self-pay | Admitting: Internal Medicine

## 2015-01-12 ENCOUNTER — Ambulatory Visit (INDEPENDENT_AMBULATORY_CARE_PROVIDER_SITE_OTHER): Payer: 59 | Admitting: Internal Medicine

## 2015-01-12 ENCOUNTER — Ambulatory Visit (INDEPENDENT_AMBULATORY_CARE_PROVIDER_SITE_OTHER): Payer: 59 | Admitting: Family Medicine

## 2015-01-12 VITALS — BP 114/94 | HR 99 | Temp 99.4°F | Ht 67.5 in | Wt 149.5 lb

## 2015-01-12 VITALS — BP 108/74 | HR 92 | Ht 67.25 in | Wt 149.2 lb

## 2015-01-12 DIAGNOSIS — R110 Nausea: Secondary | ICD-10-CM

## 2015-01-12 DIAGNOSIS — K920 Hematemesis: Secondary | ICD-10-CM | POA: Diagnosis not present

## 2015-01-12 DIAGNOSIS — R1033 Periumbilical pain: Secondary | ICD-10-CM | POA: Diagnosis not present

## 2015-01-12 LAB — CBC WITH DIFFERENTIAL/PLATELET
BASOS ABS: 0.1 10*3/uL (ref 0.0–0.1)
Basophils Relative: 0.7 % (ref 0.0–3.0)
EOS PCT: 1.3 % (ref 0.0–5.0)
Eosinophils Absolute: 0.1 10*3/uL (ref 0.0–0.7)
HEMATOCRIT: 44.4 % (ref 39.0–52.0)
Hemoglobin: 15 g/dL (ref 13.0–17.0)
LYMPHS ABS: 2 10*3/uL (ref 0.7–4.0)
LYMPHS PCT: 21 % (ref 12.0–46.0)
MCHC: 33.7 g/dL (ref 30.0–36.0)
MCV: 95.5 fl (ref 78.0–100.0)
Monocytes Absolute: 1.1 10*3/uL — ABNORMAL HIGH (ref 0.1–1.0)
Monocytes Relative: 11.5 % (ref 3.0–12.0)
NEUTROS ABS: 6.3 10*3/uL (ref 1.4–7.7)
NEUTROS PCT: 65.5 % (ref 43.0–77.0)
Platelets: 224 10*3/uL (ref 150.0–400.0)
RBC: 4.65 Mil/uL (ref 4.22–5.81)
RDW: 13.8 % (ref 11.5–15.5)
WBC: 9.6 10*3/uL (ref 4.0–10.5)

## 2015-01-12 MED ORDER — PANTOPRAZOLE SODIUM 40 MG PO TBEC: 40.0000 mg | DELAYED_RELEASE_TABLET | Freq: Every day | ORAL | Status: DC

## 2015-01-12 NOTE — Patient Instructions (Signed)
Hematemesis This condition is the vomiting of blood. CAUSES  This can happen if you have a peptic ulcer or an irritation of the throat, stomach, or small bowel. Vomiting over and over again or swallowing blood from a nosebleed, coughing or facial injury can also result in bloody vomit. Anti-inflammatory pain medicines are a common cause of this potentially dangerous condition. The most serious causes of vomiting blood include:  Ulcers (a bacteria called H. pylori is common cause of ulcers).  Clotting problems.  Alcoholism.  Cirrhosis. TREATMENT  Treatment depends on the cause and the severity of the bleeding. Small amounts of blood streaks in the vomit is not the same as vomiting large amounts of bloody or dark, coffee grounds-like material. Weakness, fainting, dehydration, anemia, and continued alcohol or drug use increase the risk. Examination may include blood, vomit, or stool tests. The presence of bloody or dark stool that tests positive for blood (Hemoccult) means the bleeding has been going on for some time. Endoscopy and imaging studies may be done. Emergency treatment may include:  IV medicines or fluids.  Blood transfusions.  Surgery. Hospital care is required for high risk patients or when IV fluids or blood is needed. Upper GI bleeding can cause shock and death if not controlled. HOME CARE INSTRUCTIONS   Your treatment does not require hospital care at this time.  Remain at rest until your condition improves.  Drink clear liquids as tolerated.  Avoid:  Alcohol.  Nicotine.  Aspirin.  Any other anti-inflammatory medicine (ibuprofen, naproxen, and many others).  Medications to suppress stomach acid or vomiting may be needed. Take all your medicine as prescribed.  Be sure to see your caregiver for follow-up as recommended. SEEK IMMEDIATE MEDICAL CARE IF:   You have repeated vomiting, dehydration, fainting, or extreme weakness.  You are vomiting large amounts of  bloody or dark material.  You pass large, dark or bloody stools. Document Released: 05/04/2004 Document Revised: 06/19/2011 Document Reviewed: 05/20/2008 [facility] Patient Information 2015 [place], [place]. This information is not intended to replace advice given to you by your health care provider. Make sure you discuss any questions you have with your health care provider.  AVOID all nonsteroidal medications such as aspirin, Advil, Aleve Start Protonix 40 mg once daily Reduce/avoid alcohol. We will set up GI referral Please follow up immediately for any dizziness, shortness of breath, increased abdominal pain or any recurrent hematemesis.

## 2015-01-12 NOTE — Progress Notes (Signed)
   Subjective:    Patient ID: Patrick [name], male    DOB: 10/05/1950, 64 y.o.   MRN: [id number] Cc: vomiting blood HPI Patient is a pleasant middle-aged white man usually followed by Dr. Fuller Plan. For the past several days he's had intermittent nausea with some vomiting and regurgitation, he has brought up coffee ground-like material. He had bad indigestion and periumbilical abdominal pain last weekend before this started. He actually induced vomiting because he felt so bad on at least one occasion and saw the brown coffee-ground like material. He had a couple more episodes of this with spontaneous vomiting he's had some dark stools and now the stool is apparently become brown. He was seen by Dr. Elease Hashimoto earlier today and referred to [place]. Dr. Elease Hashimoto found his stool being heme positive. He ordered a CBC. Wt Readings from Last 3 Encounters:  01/12/15 149 lb 4 oz (67.699 kg)  01/12/15 149 lb 8 oz (67.813 kg)  08/17/14 141 lb (63.957 kg)   Medications, allergies, past medical history, past surgical history, family history and social history are reviewed and updated in the EMR.   Review of Systems As above    Objective:   Physical Exam @BP  108/74 mmHg  Pulse 92  Ht 5' 7.25" (1.708 m)  Wt 149 lb 4 oz (67.699 kg)  BMI 23.21 kg/m2@  General:  NAD Eyes:   anicteric Lungs:  clear Heart:: S1S2 no rubs, murmurs or gallops Abdomen:  Mildly protuberant, soft and nontender, BS+     Data Reviewed:  Prior colonoscopy reports, Dr. Erick Blinks note from today    Assessment & Plan:  Hematemesis with nausea  Periumbilical abdominal pain   Await CBC Start PPI EGD 10/7 1530 hrs The risks and benefits as well as alternatives of endoscopic procedure(s) have been discussed and reviewed. All questions answered. The patient agrees to proceed.   Lab Results  Component Value Date   WBC 9.6 01/12/2015   HGB 15.0 01/12/2015   HCT 44.4 01/12/2015   MCV 95.5 01/12/2015   PLT 224.0 01/12/2015     [id number],IYMEB W, MD

## 2015-01-12 NOTE — Telephone Encounter (Signed)
Caller name: Hilda Blades  Relation to pt: Referral Coordinator - Dr. Michelene Gardener  Call back number: [phone]   Reason for call: Dr. Elease Hashimoto would like this patient seen this week for hematochezia.  Dr. Elease Hashimoto would like a call from DOD if we can not get patient in this week.  Pt has hx with Dr. Fuller Plan.

## 2015-01-12 NOTE — Telephone Encounter (Signed)
I spoke with Mr. Patrick [name].  He will come in today and see Dr. Carlean Purl at 1:30 [place] notified

## 2015-01-12 NOTE — Progress Notes (Signed)
   Subjective:    Patient ID: Patrick [name], male    DOB: 1950-08-05, 64 y.o.   MRN: [id number]  HPI Patient is seen with chief complaint of hematemesis. He states that on Saturday he vomited some emesis that looked slightly brownish in color and then by Sunday night vomited what looked like dark blood. He's not had any episodes of vomiting since Sunday night. He denies any prior history of peptic ulcer disease. He's had some decreased appetite and occasional upper abdominal cramp-like pains but no severe abdominal pain. He denies any dysphagia. Denies any recent weight changes. He's noted some dark colored stools over the past couple days.  Patient is a smoker. He has history of excessive alcohol use-but no known history of cirrhosis. He has had previous evidence for fatty liver changes on ultrasound. He states he has cut down recently to 2 gins per day but this is usually about 6 ounces total. No recent non-steroidal use.  Past Medical History  Diagnosis Date  . Depression   . Diverticulitis   . Kidney stone   . Colon polyps   . Diverticulosis of colon    No past surgical history on file.  reports that he has been smoking Cigarettes.  He has been smoking about 1.00 pack per day. He has never used smokeless tobacco. He reports that he drinks about 8.4 oz of alcohol per week. He reports that he does not use illicit drugs. family history includes Heart disease (age of onset: 31) in his father. No Known Allergies    Review of Systems  Constitutional: Positive for appetite change. Negative for fever, chills and unexpected weight change.  HENT: Negative for trouble swallowing.   Respiratory: Negative for shortness of breath.   Cardiovascular: Negative for chest pain.  Gastrointestinal: Positive for nausea, vomiting and abdominal pain. Negative for diarrhea, constipation, abdominal distention and rectal pain.  Neurological: Negative for dizziness and syncope.  Psychiatric/Behavioral:  Negative for confusion.       Objective:   Physical Exam  Constitutional: He appears well-developed and well-nourished.  Cardiovascular: Normal rate and regular rhythm.   Pulmonary/Chest: Effort normal and breath sounds normal. No respiratory distress. He has no wheezes. He has no rales.  Abdominal: Soft. Bowel sounds are normal. He exhibits no distension and no mass. There is no tenderness. There is no rebound and no guarding.  Genitourinary:  No rectal mass. He has dark brown stool to slightly black stool which is Hemoccult-positive          Assessment & Plan:  Hematemesis. Differential is gastritis, peptic ulcer disease vs other. Clinically stable at this point. No orthostasis. He has no history of known cirrhosis or esophageal varices. Clinically stable. Needs further GI assessment. Check CBC. Avoid all non-steroidal's. He is encouraged to stop alcohol altogether. Start Protonix 40 mg once daily. Follow-up immediately for any dizziness, abdominal pain, or recurrent episodes of hematemesis

## 2015-01-12 NOTE — Patient Instructions (Signed)
  You have been scheduled for an endoscopy. Please follow written instructions given to you at your visit today. If you use inhalers (even only as needed), please bring them with you on the day of your procedure.   I appreciate the opportunity to care for you. Carl Gessner, MD, FACG 

## 2015-01-12 NOTE — Progress Notes (Signed)
Pre visit review using our clinic review tool, if applicable. No additional management support is needed unless otherwise documented below in the visit note. 

## 2015-01-14 ENCOUNTER — Telehealth: Payer: Self-pay | Admitting: Internal Medicine

## 2015-01-14 NOTE — Telephone Encounter (Signed)
Patient is not running a fever at all, but is quite hoarse. I told him that we would do his EGD unless he is running a fever or has bad URI symptoms.  He said that he would call us if he got worse or started running a fever.

## 2015-01-15 ENCOUNTER — Ambulatory Visit (AMBULATORY_SURGERY_CENTER): Payer: 59 | Admitting: Internal Medicine

## 2015-01-15 ENCOUNTER — Encounter: Payer: Self-pay | Admitting: Internal Medicine

## 2015-01-15 VITALS — BP 114/72 | HR 84 | Temp 100.0°F | Resp 25 | Ht 67.0 in | Wt 149.0 lb

## 2015-01-15 DIAGNOSIS — K920 Hematemesis: Secondary | ICD-10-CM

## 2015-01-15 DIAGNOSIS — K2100 Gastro-esophageal reflux disease with esophagitis, without bleeding: Secondary | ICD-10-CM

## 2015-01-15 DIAGNOSIS — R110 Nausea: Secondary | ICD-10-CM | POA: Diagnosis not present

## 2015-01-15 DIAGNOSIS — K449 Diaphragmatic hernia without obstruction or gangrene: Secondary | ICD-10-CM | POA: Diagnosis not present

## 2015-01-15 MED ORDER — SODIUM CHLORIDE 0.9 % IV SOLN: 500.0000 mL | INTRAVENOUS | Status: DC

## 2015-01-15 MED ORDER — OMEPRAZOLE-SODIUM BICARBONATE 20-1100 MG PO CAPS: 1.0000 | ORAL_CAPSULE | Freq: Every day | ORAL | Status: DC

## 2015-01-15 NOTE — Progress Notes (Signed)
Called to room to assist during endoscopic procedure.  Patient ID and intended procedure confirmed with present staff. Received instructions for my participation in the procedure from the performing physician.  

## 2015-01-15 NOTE — Progress Notes (Signed)
Transferred to recovery room. A/O x3, pleased with MAC.  VSS.  Report to Jane, RN. 

## 2015-01-15 NOTE — Patient Instructions (Addendum)
You have severe esophagitis - inflamed esophagus - looks like its from acid refluc. Please stay on pantoprazole and follow acid reflux diet. Add Zegerid OTC at bedtime - I sent a prescription to the pharmacy.  We will notify pathology results and follow-up by mail.  I appreciate the opportunity to care for you. Gatha Mayer, MD, FACG  YOU HAD AN ENDOSCOPIC PROCEDURE TODAY AT [facility]:   Refer to the procedure report that was given to you for any specific questions about what was found during the examination.  If the procedure report does not answer your questions, please call your gastroenterologist to clarify.  If you requested that your care partner not be given the details of your procedure findings, then the procedure report has been included in a sealed envelope for you to review at your convenience later.  YOU SHOULD EXPECT: Some feelings of bloating in the abdomen. Passage of more gas than usual.  Walking can help get rid of the air that was put into your GI tract during the procedure and reduce the bloating. If you had a lower endoscopy (such as a colonoscopy or flexible sigmoidoscopy) you may notice spotting of blood in your stool or on the toilet paper. If you underwent a bowel prep for your procedure, you may not have a normal bowel movement for a few days.  Please Note:  You might notice some irritation and congestion in your nose or some drainage.  This is from the oxygen used during your procedure.  There is no need for concern and it should clear up in a day or so.  SYMPTOMS TO REPORT IMMEDIATELY:   Following upper endoscopy (EGD)  Vomiting of blood or coffee ground material  New chest pain or pain under the shoulder blades  Painful or persistently difficult swallowing  New shortness of breath  Fever of 100F or higher  Black, tarry-looking stools  For urgent or emergent issues, a gastroenterologist can be reached at any hour by calling [phone]  [phone].   DIET: Your first meal following the procedure should be a small meal and then it is ok to progress to your normal diet. Heavy or fried foods are harder to digest and may make you feel nauseous or bloated.  Likewise, meals heavy in dairy and vegetables can increase bloating.  Drink plenty of fluids but you should avoid alcoholic beverages for 24 hours.  ACTIVITY:  You should plan to take it easy for the rest of today and you should NOT DRIVE or use heavy machinery until tomorrow (because of the sedation medicines used during the test).    FOLLOW UP: Our staff will call the number listed on your records the next business day following your procedure to check on you and address any questions or concerns that you may have regarding the information given to you following your procedure. If we do not reach you, we will leave a message.  However, if you are feeling well and you are not experiencing any problems, there is no need to return our call.  We will assume that you have returned to your regular daily activities without incident.  If any biopsies were taken you will be contacted by phone or by letter within the next 1-3 weeks.  Please call us at [phone] if you have not heard about the biopsies in 3 weeks.    SIGNATURES/CONFIDENTIALITY: You and/or your care partner have signed paperwork which will be entered into your electronic  medical record.  These signatures attest to the fact that that the information above on your After Visit Summary has been reviewed and is understood.  Full responsibility of the confidentiality of this discharge information lies with you and/or your care-partner.  Hiatal hernia, and esophagitis information given  Anti reflux diet given  Continue Panoprazole, and take zegerid at bedtime in addition to pantoprazole in am.  Probable repeat endoscopy in 6 months with Dr. Fuller Plan.

## 2015-01-15 NOTE — Op Note (Signed)
[place]  [address] [place], [id number]   ENDOSCOPY PROCEDURE REPORT  PATIENT: Patrick [name], Patrick [name]  MR#: [id number] BIRTHDATE: [birth year] , 64  yrs. old GENDER: male ENDOSCOPIST: Gatha Mayer, MD, [facility] PROCEDURE DATE:  01/15/2015 PROCEDURE:  EGD w/ biopsy ASA CLASS:     Class II INDICATIONS:  hematemesis. MEDICATIONS: Propofol 150 mg IV and Monitored anesthesia care TOPICAL ANESTHETIC: none  DESCRIPTION OF PROCEDURE: After the risks benefits and alternatives of the procedure were thoroughly explained, informed consent was obtained.  The LB [id number] endoscope was introduced through the mouth and advanced to the second portion of the duodenum , Without limitations.  The instrument was slowly withdrawn as the mucosa was fully examined.    1) Grade C esophagitis mid-distal; esophagus (25-35 cm).  Suspect GERD, ? underlying Barrett's - biopsies taken. 2) 5 cm hiatal hernia - 35-40 cm 3) Otherwise normal.  Retroflexed views revealed a hiatal hernia.     The scope was then withdrawn from the patient and the procedure completed.  COMPLICATIONS: There were no immediate complications.  ENDOSCOPIC IMPRESSION: Grade C (severe) esophagitis and a hiatal hernia (5 cm)  RECOMMENDATIONS: 1.  Await pathology results 2.  Continue PPI  take OTC Zegerid at bedtime in addition to pantoprazole AM 3.  Anticipate he will need a repeat EGD to document healing with in 6 months. Should be symptom free at that time. Pending pathology I will arrange f/u Dr. Fuller Plan - his primary GI MD - I saw him in Dr. Lynne Leader absence    eSigned:  Gatha Mayer, MD, [facility] 01/15/2015 3:48 PM    [id number] Burchette, MD; The Patient ; Freda Jackson

## 2015-01-18 ENCOUNTER — Telehealth: Payer: Self-pay | Admitting: *Deleted

## 2015-01-18 ENCOUNTER — Telehealth: Payer: Self-pay | Admitting: Internal Medicine

## 2015-01-18 NOTE — Telephone Encounter (Signed)
  Follow up Call-  Call back number 01/15/2015  Post procedure Call Back phone  # [phone]  Permission to leave phone message Yes     Patient questions:  Do you have a fever, pain , or abdominal swelling? No. Pain Score  0 *  Have you tolerated food without any problems? Yes.    Have you been able to return to your normal activities? Yes.    Do you have any questions about your discharge instructions: Diet   No. Medications  No. Follow up visit  No.  Do you have questions or concerns about your Care? No.  Actions: * If pain score is 4 or above: No action needed, pain <4.

## 2015-01-18 NOTE — Telephone Encounter (Signed)
Please advise, thank you.

## 2015-01-19 NOTE — Telephone Encounter (Signed)
Yes that is what I intended for him to do OTC Zegerid qhs and rx pantoprazole AM

## 2015-01-19 NOTE — Telephone Encounter (Signed)
Left patient a detailed message in regards to what Dr Carlean Purl wanted him to do.  Left my number to call back with any additional questions.

## 2015-01-24 ENCOUNTER — Encounter: Payer: Self-pay | Admitting: Internal Medicine

## 2015-01-24 NOTE — Progress Notes (Signed)
Quick Note:  Biopsies did not show inflammation though EGD showed erosive esophagitis Will send letter and advise him to arrange f/u Dr. Fuller Plan in Jan-Feb - I saw and scoped him in Dr. Lynne Leader absence ______

## 2015-02-19 ENCOUNTER — Telehealth: Payer: Self-pay | Admitting: Internal Medicine

## 2015-02-19 NOTE — Telephone Encounter (Signed)
Spoke with patient and answered his questions about zegerid.  Then he told me he has been having rectal bleeding x several days that seems to have stopped now, no abdominal pain, was bright red in color.  I gave him the information to reach the Doctor on call should he need to contact them over the weekend and also made an appointment for him with Patrick [name] , PA-C for 02/23/15 at 1:30PM.

## 2015-02-23 ENCOUNTER — Ambulatory Visit (INDEPENDENT_AMBULATORY_CARE_PROVIDER_SITE_OTHER): Payer: 59 | Admitting: Gastroenterology

## 2015-02-23 ENCOUNTER — Encounter: Payer: Self-pay | Admitting: Gastroenterology

## 2015-02-23 VITALS — BP 138/100 | HR 77 | Ht 67.25 in | Wt 144.8 lb

## 2015-02-23 DIAGNOSIS — K5900 Constipation, unspecified: Secondary | ICD-10-CM | POA: Insufficient documentation

## 2015-02-23 DIAGNOSIS — K5909 Other constipation: Secondary | ICD-10-CM | POA: Diagnosis not present

## 2015-02-23 DIAGNOSIS — R194 Change in bowel habit: Secondary | ICD-10-CM | POA: Diagnosis not present

## 2015-02-23 DIAGNOSIS — Z86010 Personal history of colonic polyps: Secondary | ICD-10-CM | POA: Diagnosis not present

## 2015-02-23 DIAGNOSIS — K625 Hemorrhage of anus and rectum: Secondary | ICD-10-CM

## 2015-02-23 MED ORDER — HYDROCORTISONE ACE-PRAMOXINE 2.5-1 % RE CREA: TOPICAL_CREAM | RECTAL | Status: DC

## 2015-02-23 MED ORDER — POLYETHYLENE GLYCOL 3350 17 GM/SCOOP PO POWD: ORAL | Status: DC

## 2015-02-23 MED ORDER — NA SULFATE-K SULFATE-MG SULF 17.5-3.13-1.6 GM/177ML PO SOLN: 1.0000 | Freq: Once | ORAL | Status: AC

## 2015-02-23 NOTE — Patient Instructions (Addendum)
We sent a prescription to [facility] in [address], [address], [place], for the colonoscopy prep, "Suprep".  We also sent a prescription for the Analpram Cream, use twice daily for 14 days. We have given you packets of Miralax. Take 2 packets in 8 1-12  0z of water tonight.  Take Miralax daily.   You have been scheduled for a colonoscopy. Please follow written instructions given to you at your visit today.  If you use inhalers (even only as needed), please bring them with you on the day of your procedure. Your physician has requested that you go to www.startemmi.com and enter the access code given to you at your visit today. This web site gives a general overview about your procedure. However, you should still follow specific instructions given to you by our office regarding your preparation for the procedure.

## 2015-02-23 NOTE — Progress Notes (Signed)
Reviewed and agree with management plan.  Syan Cullimore T. Tayon Parekh, MD FACG 

## 2015-02-23 NOTE — Progress Notes (Signed)
     02/23/2015 Patrick [name] [id number] 1950-06-13   History of Present Illness:  This is a 64 year old male who is known to Dr. Fuller Plan.  He had a colonoscopy in January 2012 at which time his found have only moderate diverticulosis, however, due to history of adenomatous polyps it was recommended that he have a repeat colonoscopy in 5 years from that time. He presents to our office today with complaints of rectal bleeding and change in bowel habits with constipation. He says that he saw some bright red blood with a bowel movement one day last week.  No further bleeding since that time.  Also has some anal discomfort when he wipes. He says that recently he has also had issues with constipation, which was not his usual in the past. He says that he has the sensation to have a bowel movement then does not have complete evacuation; only passes small amounts of stool. He has not had any recent labs, but sees his PCP yearly in December and gets extensive routine labs done at that time.  Current Medications, Allergies, Past Medical History, Past Surgical History, Family History and Social History were reviewed in [employer] record.   Physical Exam: BP 138/100 mmHg  Pulse 77  Ht 5' 7.25" (1.708 m)  Wt 144 lb 12.8 oz (65.681 kg)  BMI 22.51 kg/m2  SpO2 98% General: Well developed white male in no acute distress Head: Normocephalic and atraumatic Eyes:  Sclerae anicteric, conjunctiva pink  Ears: Normal auditory acuity Lungs: Clear throughout to auscultation Heart: Regular rate and rhythm Abdomen: Soft, non-distended.  Normal bowel sounds.  Non-tender. Rectal:  Will be done at the time of colonoscopy. Musculoskeletal: Symmetrical with no gross deformities  Extremities: No edema  Neurological: Alert oriented x 4, grossly non-focal; had hand tremors. Psychological:  Alert and cooperative. Normal mood and affect  Assessment and Recommendations: -Rectal bleeding:  One  episode last week.  Some anal discomfort with BM's as well.  Likely hemorrhoidal or outlet source.  Will try analpram cream 2.5% BID. -Change of bowels with recent constipation:  Will have him take two doses of Miralax today and then begin using Miralax daily. -Personal history of colon polyps:  Colonoscopy recall due 04/2015 for history of adenomatous colon polyps.  Will schedule with Dr. Fuller Plan.  The risks, benefits, and alternatives to colonoscopy were discussed with the patient and he consents to proceed.

## 2015-03-11 ENCOUNTER — Other Ambulatory Visit (INDEPENDENT_AMBULATORY_CARE_PROVIDER_SITE_OTHER): Payer: 59

## 2015-03-11 ENCOUNTER — Other Ambulatory Visit: Payer: Self-pay | Admitting: Family Medicine

## 2015-03-11 DIAGNOSIS — Z0000 Encounter for general adult medical examination without abnormal findings: Secondary | ICD-10-CM | POA: Diagnosis not present

## 2015-03-11 LAB — LIPID PANEL
CHOLESTEROL: 147 mg/dL (ref 0–200)
HDL: 66 mg/dL (ref >39.00)
LDL CALC: 71 mg/dL (ref 0–99)
NonHDL: 81.42
TRIGLYCERIDES: 52 mg/dL (ref 0.0–149.0)
Total CHOL/HDL Ratio: 2
VLDL: 10.4 mg/dL (ref 0.0–40.0)

## 2015-03-11 LAB — CBC WITH DIFFERENTIAL/PLATELET
BASOS ABS: 0 10*3/uL (ref 0.0–0.1)
Basophils Relative: 0.5 % (ref 0.0–3.0)
EOS ABS: 0.1 10*3/uL (ref 0.0–0.7)
Eosinophils Relative: 1.4 % (ref 0.0–5.0)
HEMATOCRIT: 40.7 % (ref 39.0–52.0)
Hemoglobin: 13.3 g/dL (ref 13.0–17.0)
LYMPHS ABS: 1.6 10*3/uL (ref 0.7–4.0)
LYMPHS PCT: 37.7 % (ref 12.0–46.0)
MCHC: 32.8 g/dL (ref 30.0–36.0)
MCV: 95.5 fl (ref 78.0–100.0)
Monocytes Absolute: 0.5 10*3/uL (ref 0.1–1.0)
Monocytes Relative: 13 % — ABNORMAL HIGH (ref 3.0–12.0)
NEUTROS ABS: 2 10*3/uL (ref 1.4–7.7)
NEUTROS PCT: 47.4 % (ref 43.0–77.0)
PLATELETS: 112 10*3/uL — AB (ref 150.0–400.0)
RBC: 4.26 Mil/uL (ref 4.22–5.81)
RDW: 15.1 % (ref 11.5–15.5)
WBC: 4.2 10*3/uL (ref 4.0–10.5)

## 2015-03-11 LAB — BASIC METABOLIC PANEL
BUN: 5 mg/dL — ABNORMAL LOW (ref 6–23)
CALCIUM: 9 mg/dL (ref 8.4–10.5)
CO2: 32 meq/L (ref 19–32)
CREATININE: 0.67 mg/dL (ref 0.40–1.50)
Chloride: 101 mEq/L (ref 96–112)
GFR: 127.67 mL/min (ref >60.00)
Glucose, Bld: 88 mg/dL (ref 70–99)
Potassium: 3.7 mEq/L (ref 3.5–5.1)
Sodium: 144 mEq/L (ref 135–145)

## 2015-03-11 LAB — HEPATIC FUNCTION PANEL
ALK PHOS: 66 U/L (ref 39–117)
ALT: 20 U/L (ref 0–53)
AST: 57 U/L — ABNORMAL HIGH (ref 0–37)
Albumin: 3.9 g/dL (ref 3.5–5.2)
BILIRUBIN DIRECT: 0.3 mg/dL (ref 0.0–0.3)
Total Bilirubin: 0.9 mg/dL (ref 0.2–1.2)
Total Protein: 6.9 g/dL (ref 6.0–8.3)

## 2015-03-11 LAB — PSA: PSA: 0.51 ng/mL (ref 0.10–4.00)

## 2015-03-11 LAB — TSH: TSH: 1.17 u[IU]/mL (ref 0.35–4.50)

## 2015-03-17 ENCOUNTER — Ambulatory Visit (INDEPENDENT_AMBULATORY_CARE_PROVIDER_SITE_OTHER): Payer: 59 | Admitting: Family Medicine

## 2015-03-17 ENCOUNTER — Encounter: Payer: Self-pay | Admitting: Family Medicine

## 2015-03-17 VITALS — BP 150/100 | HR 75 | Temp 99.1°F | Resp 16 | Ht 67.0 in | Wt 141.7 lb

## 2015-03-17 DIAGNOSIS — D696 Thrombocytopenia, unspecified: Secondary | ICD-10-CM | POA: Diagnosis not present

## 2015-03-17 DIAGNOSIS — L300 Nummular dermatitis: Secondary | ICD-10-CM

## 2015-03-17 DIAGNOSIS — I10 Essential (primary) hypertension: Secondary | ICD-10-CM

## 2015-03-17 DIAGNOSIS — F1029 Alcohol dependence with unspecified alcohol-induced disorder: Secondary | ICD-10-CM

## 2015-03-17 DIAGNOSIS — Z0000 Encounter for general adult medical examination without abnormal findings: Secondary | ICD-10-CM

## 2015-03-17 DIAGNOSIS — F1020 Alcohol dependence, uncomplicated: Secondary | ICD-10-CM | POA: Insufficient documentation

## 2015-03-17 MED ORDER — TRIAMCINOLONE ACETONIDE 0.1 % EX CREA: 1.0000 "application " | TOPICAL_CREAM | Freq: Two times a day (BID) | CUTANEOUS | Status: DC | PRN

## 2015-03-17 MED ORDER — LOSARTAN POTASSIUM 50 MG PO TABS: 50.0000 mg | ORAL_TABLET | Freq: Every day | ORAL | Status: DC

## 2015-03-17 NOTE — Progress Notes (Signed)
Pre visit review using our clinic review tool, if applicable. No additional management support is needed unless otherwise documented below in the visit note. 

## 2015-03-17 NOTE — Patient Instructions (Signed)
Smoking Cessation, Tips for Success If you are ready to quit smoking, congratulations! You have chosen to help yourself be healthier. Cigarettes bring nicotine, tar, carbon monoxide, and other irritants into your body. Your lungs, heart, and blood vessels will be able to work better without these poisons. There are many different ways to quit smoking. Nicotine gum, nicotine patches, a nicotine inhaler, or nicotine nasal spray can help with physical craving. Hypnosis, support groups, and medicines help break the habit of smoking. WHAT THINGS CAN I DO TO MAKE QUITTING EASIER?  Here are some tips to help you quit for good:  Pick a date when you will quit smoking completely. Tell all of your friends and family about your plan to quit on that date.  Do not try to slowly cut down on the number of cigarettes you are smoking. Pick a quit date and quit smoking completely starting on that day.  Throw away all cigarettes.   Clean and remove all ashtrays from your home, work, and car.  On a card, write down your reasons for quitting. Carry the card with you and read it when you get the urge to smoke.  Cleanse your body of nicotine. Drink enough water and fluids to keep your urine clear or pale yellow. Do this after quitting to flush the nicotine from your body.  Learn to predict your moods. Do not let a bad situation be your excuse to have a cigarette. Some situations in your life might tempt you into wanting a cigarette.  Never have "just one" cigarette. It leads to wanting another and another. Remind yourself of your decision to quit.  Change habits associated with smoking. If you smoked while driving or when feeling stressed, try other activities to replace smoking. Stand up when drinking your coffee. Brush your teeth after eating. Sit in a different chair when you read the paper. Avoid alcohol while trying to quit, and try to drink fewer caffeinated beverages. Alcohol and caffeine may urge you to  smoke.  Avoid foods and drinks that can trigger a desire to smoke, such as sugary or spicy foods and alcohol.  Ask people who smoke not to smoke around you.  Have something planned to do right after eating or having a cup of coffee. For example, plan to take a walk or exercise.  Try a relaxation exercise to calm you down and decrease your stress. Remember, you may be tense and nervous for the first 2 weeks after you quit, but this will pass.  Find new activities to keep your hands busy. Play with a pen, coin, or rubber band. Doodle or draw things on paper.  Brush your teeth right after eating. This will help cut down on the craving for the taste of tobacco after meals. You can also try mouthwash.   Use oral substitutes in place of cigarettes. Try using lemon drops, carrots, cinnamon sticks, or chewing gum. Keep them handy so they are available when you have the urge to smoke.  When you have the urge to smoke, try deep breathing.  Designate your home as a nonsmoking area.  If you are a heavy smoker, ask your health care provider about a prescription for nicotine chewing gum. It can ease your withdrawal from nicotine.  Reward yourself. Set aside the cigarette money you save and buy yourself something nice.  Look for support from others. Join a support group or smoking cessation program. Ask someone at home or at work to help you with your plan   to quit smoking.  Always ask yourself, "Do I need this cigarette or is this just a reflex?" Tell yourself, "Today, I choose not to smoke," or "I do not want to smoke." You are reminding yourself of your decision to quit.  Do not replace cigarette smoking with electronic cigarettes (commonly called e-cigarettes). The safety of e-cigarettes is unknown, and some may contain harmful chemicals.  If you relapse, do not give up! Plan ahead and think about what you will do the next time you get the urge to smoke. HOW WILL I FEEL WHEN I QUIT SMOKING? You  may have symptoms of withdrawal because your body is used to nicotine (the addictive substance in cigarettes). You may crave cigarettes, be irritable, feel very hungry, cough often, get headaches, or have difficulty concentrating. The withdrawal symptoms are only temporary. They are strongest when you first quit but will go away within 10-14 days. When withdrawal symptoms occur, stay in control. Think about your reasons for quitting. Remind yourself that these are signs that your body is healing and getting used to being without cigarettes. Remember that withdrawal symptoms are easier to treat than the major diseases that smoking can cause.  Even after the withdrawal is over, expect periodic urges to smoke. However, these cravings are generally short lived and will go away whether you smoke or not. Do not smoke! WHAT RESOURCES ARE AVAILABLE TO HELP ME QUIT SMOKING? Your health care provider can direct you to community resources or hospitals for support, which may include:  Group support.  Education.  Hypnosis.  Therapy.   This information is not intended to replace advice given to you by your health care provider. Make sure you discuss any questions you have with your health care provider.   Document Released: 12/24/2003 Document Revised: 04/17/2014 Document Reviewed: 09/12/2012 Elsevier Interactive Patient Education 2016 [employer].  Scale back alcohol use.

## 2015-03-17 NOTE — Progress Notes (Signed)
Subjective:    Patient ID: Patrick [name], male    DOB: June 17, 1950, 64 y.o.   MRN: [id number]  HPI Here for complete physical-and for medical issues below. Recent episode of hematemesis. EGD revealed esophagitis changes. He is currently taking Protonix in the morning and Zegerid at night. Symptoms stable. No further hematemesis. He has had some mild weight loss since follow-up. He's had some recent hematochezia. He's been scheduled for repeat colonoscopy by GI.  He has history of alcohol use and drinks about 6 ounces of gin per day. Still smokes about a pack of cigarettes per day and low motivation to quit. Denies any recent cough or hemoptysis. Previous ultrasound abdomen showed fatty liver changes.  History of elevated blood pressure. Currently takes metoprolol. Does not monitor blood pressures regularly. Recent skin rash lower legs. This is a new problem. Pruritic and nummular and scaly. No trauma or upper extremity involvement.  Patient declines flu vaccine  Past Medical History  Diagnosis Date  . Depression   . Diverticulitis   . Kidney stone   . Colon polyps   . Diverticulosis of colon   . GERD (gastroesophageal reflux disease)    Past Surgical History  Procedure Laterality Date  . Colonoscopy    . Upper gastrointestinal endoscopy      reports that he has been smoking Cigarettes.  He has been smoking about 1.00 pack per day. He has never used smokeless tobacco. He reports that he drinks about 8.4 oz of alcohol per week. He reports that he does not use illicit drugs. family history includes Heart disease (age of onset: 52) in his father. No Known Allergies    Review of Systems  Constitutional: Positive for unexpected weight change. Negative for fever, activity change, appetite change and fatigue.  HENT: Negative for congestion, ear pain and trouble swallowing.   Eyes: Negative for pain and visual disturbance.  Respiratory: Negative for cough, shortness of breath and  wheezing.   Cardiovascular: Negative for chest pain and palpitations.  Gastrointestinal: Negative for nausea, vomiting, abdominal pain, diarrhea, constipation, blood in stool, abdominal distention and rectal pain.  Genitourinary: Negative for dysuria, hematuria and testicular pain.  Musculoskeletal: Negative for joint swelling and arthralgias.  Skin: Positive for rash.  Neurological: Negative for dizziness, syncope and headaches.  Hematological: Negative for adenopathy.  Psychiatric/Behavioral: Negative for confusion and dysphoric mood.       Objective:   Physical Exam  Constitutional: He is oriented to person, place, and time. He appears well-developed and well-nourished. No distress.  HENT:  Head: Normocephalic and atraumatic.  Right Ear: External ear normal.  Left Ear: External ear normal.  Mouth/Throat: Oropharynx is clear and moist.  Eyes: Conjunctivae and EOM are normal. Pupils are equal, round, and reactive to light.  Neck: Normal range of motion. Neck supple. No thyromegaly present.  Cardiovascular: Normal rate, regular rhythm and normal heart sounds.   No murmur heard. Pulmonary/Chest: No respiratory distress. He has no wheezes. He has no rales.  Abdominal: Soft. Bowel sounds are normal. He exhibits no distension and no mass. There is no tenderness. There is no rebound and no guarding.  Musculoskeletal: He exhibits no edema.  Lymphadenopathy:    He has no cervical adenopathy.  Neurological: He is alert and oriented to person, place, and time. He displays normal reflexes. No cranial nerve deficit.  Skin: Rash noted.  Patient has a couple of nummular areas of rash on his lower legs with erythematous base and scaly surface. No clearing  in the center. No pustules.  Psychiatric: He has a normal mood and affect.          Assessment & Plan:  #1 complete physical. Strongly advised smoking cessation though his current motivation is low. We have also advised alcohol cessation  or at least reduction. He's had long-term difficulties with this the past. Flu vaccine advised the patient declines. He's had previous pneumonia vaccination. Follow-up with GI regarding repeat colonoscopy #2 hypertension currently elevated. He's had multiple recent visits with elevated blood pressure. Add losartan 50 mg daily. Reassess blood pressure one month  #3 recent low platelets 122,000. Possibly related to alcohol use. Recheck CBC at follow-up in one month #4 skin rash. Suspect nummular eczema. Triamcinolone 0.1% cream twice daily. Reassess one month

## 2015-04-14 ENCOUNTER — Encounter: Payer: 59 | Admitting: Gastroenterology

## 2015-04-16 ENCOUNTER — Ambulatory Visit (INDEPENDENT_AMBULATORY_CARE_PROVIDER_SITE_OTHER): Payer: 59 | Admitting: Family Medicine

## 2015-04-16 ENCOUNTER — Encounter: Payer: Self-pay | Admitting: Family Medicine

## 2015-04-16 VITALS — BP 190/120 | HR 96 | Temp 98.7°F | Ht 67.0 in | Wt 146.0 lb

## 2015-04-16 DIAGNOSIS — D696 Thrombocytopenia, unspecified: Secondary | ICD-10-CM

## 2015-04-16 DIAGNOSIS — F1029 Alcohol dependence with unspecified alcohol-induced disorder: Secondary | ICD-10-CM | POA: Diagnosis not present

## 2015-04-16 DIAGNOSIS — E876 Hypokalemia: Secondary | ICD-10-CM | POA: Diagnosis not present

## 2015-04-16 DIAGNOSIS — I10 Essential (primary) hypertension: Secondary | ICD-10-CM | POA: Diagnosis not present

## 2015-04-16 LAB — BASIC METABOLIC PANEL
BUN: 6 mg/dL (ref 6–23)
CHLORIDE: 100 meq/L (ref 96–112)
CO2: 35 meq/L — AB (ref 19–32)
Calcium: 9.3 mg/dL (ref 8.4–10.5)
Creatinine, Ser: 0.59 mg/dL (ref 0.40–1.50)
GFR: 147.8 mL/min (ref >60.00)
Glucose, Bld: 89 mg/dL (ref 70–99)
POTASSIUM: 3 meq/L — AB (ref 3.5–5.1)
SODIUM: 146 meq/L — AB (ref 135–145)

## 2015-04-16 LAB — CBC WITH DIFFERENTIAL/PLATELET
BASOS ABS: 0.1 10*3/uL (ref 0.0–0.1)
Basophils Relative: 1 % (ref 0.0–3.0)
EOS ABS: 0 10*3/uL (ref 0.0–0.7)
Eosinophils Relative: 0.8 % (ref 0.0–5.0)
HCT: 37.3 % — ABNORMAL LOW (ref 39.0–52.0)
Hemoglobin: 12.5 g/dL — ABNORMAL LOW (ref 13.0–17.0)
LYMPHS ABS: 1.5 10*3/uL (ref 0.7–4.0)
Lymphocytes Relative: 28.4 % (ref 12.0–46.0)
MCHC: 33.5 g/dL (ref 30.0–36.0)
MCV: 97.5 fl (ref 78.0–100.0)
MONO ABS: 0.7 10*3/uL (ref 0.1–1.0)
MONOS PCT: 13.3 % — AB (ref 3.0–12.0)
NEUTROS PCT: 56.5 % (ref 43.0–77.0)
Neutro Abs: 3 10*3/uL (ref 1.4–7.7)
PLATELETS: 111 10*3/uL — AB (ref 150.0–400.0)
RBC: 3.83 Mil/uL — AB (ref 4.22–5.81)
RDW: 16.6 % — ABNORMAL HIGH (ref 11.5–15.5)
WBC: 5.4 10*3/uL (ref 4.0–10.5)

## 2015-04-16 MED ORDER — AMLODIPINE BESYLATE 5 MG PO TABS: 5.0000 mg | ORAL_TABLET | Freq: Every day | ORAL | Status: DC

## 2015-04-16 MED ORDER — LOSARTAN POTASSIUM 100 MG PO TABS: 100.0000 mg | ORAL_TABLET | Freq: Every day | ORAL | Status: DC

## 2015-04-16 NOTE — Progress Notes (Signed)
   Subjective:    Patient ID: Patrick [name], male    DOB: 1950/10/18, 65 y.o.   MRN: [id number]  HPI Follow-up hypertension. Patient had recent physical and severely elevated blood pressure. We added losartan 50 mg daily. He remains on metoprolol. He states he is taking medications consistently. Denies any headache. He has some nonspecific fatigue. No chest pains. Denies high sodium diet. Still drinking about 6 ounces of a day.  Recent labs revealed thrombocytopenia with platelet count 112,000. Most likely related to his alcohol abuse history. He denies any prior history of low platelets. Denies any recent easy bruising.  Recent eczema rash legs improved following steroid cream.  Past Medical History  Diagnosis Date  . Depression   . Diverticulitis   . Kidney stone   . Colon polyps   . Diverticulosis of colon   . GERD (gastroesophageal reflux disease)    Past Surgical History  Procedure Laterality Date  . Colonoscopy    . Upper gastrointestinal endoscopy      reports that he has been smoking Cigarettes.  He has been smoking about 1.00 pack per day. He has never used smokeless tobacco. He reports that he drinks about 8.4 oz of alcohol per week. He reports that he does not use illicit drugs. family history includes Heart disease (age of onset: 58) in his father. No Known Allergies    Review of Systems  Constitutional: Negative for appetite change, fatigue and unexpected weight change.  Eyes: Negative for visual disturbance.  Respiratory: Negative for cough, chest tightness and shortness of breath.   Cardiovascular: Negative for chest pain, palpitations and leg swelling.  Gastrointestinal: Negative for abdominal pain.  Genitourinary: Negative for dysuria.  Neurological: Negative for dizziness, syncope, weakness, light-headedness and headaches.       Objective:   Physical Exam  Constitutional: He is oriented to person, place, and time. He appears well-developed and  well-nourished.  Neck: Neck supple.  Cardiovascular: Normal rate and regular rhythm.   Pulmonary/Chest: Effort normal and breath sounds normal. No respiratory distress. He has no wheezes. He has no rales.  Musculoskeletal: He exhibits no edema.  Neurological: He is alert and oriented to person, place, and time.          Assessment & Plan:   Hypertension. Poorly controlled. Repeat blood pressure left arm seated by me 180/100. Increase losartan to 100 mg. Add amlodipine 5 mg daily. Check basic metabolic panel. Low-sodium diet. Reduce alcohol consumption. Recheck blood pressure 3 weeks  Low platelets. Probably related to alcoholism. Recheck CBC to assess stability  Alcohol abuse history. He is strongly encouraged to stop drinking. We've offered our support but his motivation is low

## 2015-04-16 NOTE — Progress Notes (Signed)
Pre visit review using our clinic review tool, if applicable. No additional management support is needed unless otherwise documented below in the visit note. 

## 2015-04-19 NOTE — Addendum Note (Signed)
Addended by: Elio Forget on: 04/19/2015 12:57 PM   Modules accepted: Orders

## 2015-04-28 ENCOUNTER — Other Ambulatory Visit (INDEPENDENT_AMBULATORY_CARE_PROVIDER_SITE_OTHER): Payer: 59

## 2015-04-28 ENCOUNTER — Other Ambulatory Visit: Payer: Self-pay | Admitting: Family Medicine

## 2015-04-28 DIAGNOSIS — E876 Hypokalemia: Secondary | ICD-10-CM

## 2015-04-28 LAB — POTASSIUM: POTASSIUM: 2.6 meq/L — AB (ref 3.5–5.1)

## 2015-04-28 MED ORDER — POTASSIUM CHLORIDE CRYS ER 20 MEQ PO TBCR: 20.0000 meq | EXTENDED_RELEASE_TABLET | Freq: Two times a day (BID) | ORAL | Status: DC

## 2015-04-30 ENCOUNTER — Other Ambulatory Visit: Payer: Self-pay | Admitting: Family Medicine

## 2015-04-30 DIAGNOSIS — E876 Hypokalemia: Secondary | ICD-10-CM | POA: Insufficient documentation

## 2015-05-03 ENCOUNTER — Other Ambulatory Visit (INDEPENDENT_AMBULATORY_CARE_PROVIDER_SITE_OTHER): Payer: 59

## 2015-05-03 ENCOUNTER — Encounter: Payer: Self-pay | Admitting: Gastroenterology

## 2015-05-03 DIAGNOSIS — E876 Hypokalemia: Secondary | ICD-10-CM

## 2015-05-03 LAB — POTASSIUM: Potassium: 3.8 mEq/L (ref 3.5–5.1)

## 2015-05-12 ENCOUNTER — Other Ambulatory Visit: Payer: Self-pay | Admitting: Family Medicine

## 2015-05-14 ENCOUNTER — Encounter: Payer: Self-pay | Admitting: Family Medicine

## 2015-05-14 ENCOUNTER — Ambulatory Visit (INDEPENDENT_AMBULATORY_CARE_PROVIDER_SITE_OTHER): Payer: 59 | Admitting: Family Medicine

## 2015-05-14 VITALS — BP 136/80 | HR 99 | Temp 98.5°F | Ht 67.0 in | Wt 144.2 lb

## 2015-05-14 DIAGNOSIS — I10 Essential (primary) hypertension: Secondary | ICD-10-CM | POA: Diagnosis not present

## 2015-05-14 DIAGNOSIS — K208 Other esophagitis: Secondary | ICD-10-CM | POA: Diagnosis not present

## 2015-05-14 DIAGNOSIS — E876 Hypokalemia: Secondary | ICD-10-CM | POA: Diagnosis not present

## 2015-05-14 DIAGNOSIS — F1029 Alcohol dependence with unspecified alcohol-induced disorder: Secondary | ICD-10-CM | POA: Diagnosis not present

## 2015-05-14 DIAGNOSIS — K2210 Ulcer of esophagus without bleeding: Secondary | ICD-10-CM | POA: Insufficient documentation

## 2015-05-14 LAB — BASIC METABOLIC PANEL
BUN: 19 mg/dL (ref 6–23)
CALCIUM: 10.3 mg/dL (ref 8.4–10.5)
CHLORIDE: 103 meq/L (ref 96–112)
CO2: 25 meq/L (ref 19–32)
CREATININE: 0.99 mg/dL (ref 0.40–1.50)
GFR: 81.31 mL/min (ref >60.00)
GLUCOSE: 100 mg/dL — AB (ref 70–99)
Potassium: 5.1 mEq/L (ref 3.5–5.1)
Sodium: 140 mEq/L (ref 135–145)

## 2015-05-14 MED ORDER — PANTOPRAZOLE SODIUM 40 MG PO TBEC: 40.0000 mg | DELAYED_RELEASE_TABLET | Freq: Every day | ORAL | Status: DC

## 2015-05-14 NOTE — Patient Instructions (Addendum)
I recommend OTC multivitamin with Thiamin one daily. Try to get more variety in your diet.

## 2015-05-14 NOTE — Progress Notes (Signed)
   Subjective:    Patient ID: Patrick [name], male    DOB: [birth year], 65 y.o.   MRN: [id number]  HPI Patient is here follow-up regarding hypertension and recent hypokalemia.  He had physical back in December. He had extremely high blood pressure then of 190/120. He was already taking amlodipine and metoprolol. We added losartan. Feels better overall. Blood pressure today 130/80. Denies any headaches. He states he is compliant with therapy. No peripheral edema issues.  Still binges on alcohol. He refuses inpatient or outpatient treatment. Recent potassium 2.6. Improved to 3.8 with replacement. Does not take any diuretics. Very poor dietary intake.  He had EGD back in October with severe erosive esophagitis changes. He remains on Protonix and Zegerid over-the-counter. Requesting refills of Protonix. Plan had been for repeat EGD in 6 months from previous Complains of poor appetite in general. No hematemesis. No melena  Past Medical History  Diagnosis Date  . Depression   . Diverticulitis   . Kidney stone   . Colon polyps   . Diverticulosis of colon   . GERD (gastroesophageal reflux disease)    Past Surgical History  Procedure Laterality Date  . Colonoscopy    . Upper gastrointestinal endoscopy      reports that he has been smoking Cigarettes.  He has been smoking about 1.00 pack per day. He has never used smokeless tobacco. He reports that he drinks about 8.4 oz of alcohol per week. He reports that he does not use illicit drugs. family history includes Heart disease (age of onset: 65) in his father. No Known Allergies    Review of Systems  Constitutional: Negative for appetite change and unexpected weight change.  HENT: Negative for trouble swallowing.   Respiratory: Negative for cough and shortness of breath.   Cardiovascular: Negative for chest pain.  Gastrointestinal: Negative for nausea, vomiting, abdominal pain and diarrhea.  Genitourinary: Negative for dysuria.    Neurological: Positive for tremors. Negative for dizziness and headaches.       Objective:   Physical Exam  Constitutional: He appears well-developed and well-nourished.  Cardiovascular: Normal rate and regular rhythm.   Pulmonary/Chest: Effort normal and breath sounds normal. No respiratory distress. He has no wheezes. He has no rales.  Musculoskeletal: He exhibits no edema.  Neurological: He is alert.  Psychiatric: He has a normal mood and affect. His behavior is normal.          Assessment & Plan:  #1 hypertension. Tremendously improved and now at goal. Continue current medication regimen  #2 hypokalemia. Probably related to very poor intake and alcohol abuse. Recheck basic metabolic panel  #3 long history of alcohol abuse. We talked about  long-term risks including risk of cirrhosis, bleeding complications such as esophageal variceal risk, poorly controlled blood pressure, and many other potential adverse issues. He has very low motivation. We have encouraged him to consider either inpatient or at least outpatient alcohol rehabilitation program but he declines. Have recommended multivitamin 1 daily.  #3 history of severe erosive esophagitis. Refills of Protonix were given. He is encouraged to keep follow-up with GI by March

## 2015-05-14 NOTE — Progress Notes (Signed)
Pre visit review using our clinic review tool, if applicable. No additional management support is needed unless otherwise documented below in the visit note. 

## 2015-08-01 ENCOUNTER — Other Ambulatory Visit: Payer: Self-pay | Admitting: Family Medicine

## 2015-10-01 ENCOUNTER — Other Ambulatory Visit: Payer: Self-pay | Admitting: Family Medicine

## 2015-12-29 ENCOUNTER — Other Ambulatory Visit: Payer: Self-pay | Admitting: Family Medicine

## 2016-03-29 ENCOUNTER — Other Ambulatory Visit: Payer: Self-pay | Admitting: Family Medicine

## 2016-07-12 ENCOUNTER — Other Ambulatory Visit: Payer: Self-pay | Admitting: Family Medicine

## 2016-08-07 ENCOUNTER — Other Ambulatory Visit: Payer: Self-pay | Admitting: Family Medicine

## 2016-08-13 ENCOUNTER — Other Ambulatory Visit: Payer: Self-pay | Admitting: Family Medicine

## 2016-08-15 ENCOUNTER — Other Ambulatory Visit: Payer: Self-pay | Admitting: Family Medicine

## 2016-08-21 ENCOUNTER — Ambulatory Visit: Payer: 59 | Admitting: Family Medicine

## 2016-09-07 ENCOUNTER — Other Ambulatory Visit: Payer: Self-pay | Admitting: Family Medicine

## 2016-10-01 ENCOUNTER — Other Ambulatory Visit: Payer: Self-pay | Admitting: Family Medicine

## 2016-10-18 ENCOUNTER — Other Ambulatory Visit: Payer: Self-pay | Admitting: Family Medicine

## 2016-11-25 ENCOUNTER — Inpatient Hospital Stay (HOSPITAL_COMMUNITY)
Admission: EM | Admit: 2016-11-25 | Discharge: 2016-12-01 | DRG: 811 | Disposition: A | Discharge status: Skilled Nursing Facility | Payer: BLUE CROSS/BLUE SHIELD | Attending: Internal Medicine | Admitting: Internal Medicine

## 2016-11-25 ENCOUNTER — Emergency Department (HOSPITAL_COMMUNITY): Payer: BLUE CROSS/BLUE SHIELD

## 2016-11-25 ENCOUNTER — Encounter (HOSPITAL_COMMUNITY): Payer: Self-pay | Admitting: Emergency Medicine

## 2016-11-25 ENCOUNTER — Inpatient Hospital Stay (HOSPITAL_COMMUNITY): Payer: BLUE CROSS/BLUE SHIELD

## 2016-11-25 DIAGNOSIS — F0390 Unspecified dementia without behavioral disturbance: Secondary | ICD-10-CM | POA: Diagnosis present

## 2016-11-25 DIAGNOSIS — Z7189 Other specified counseling: Secondary | ICD-10-CM

## 2016-11-25 DIAGNOSIS — N179 Acute kidney failure, unspecified: Secondary | ICD-10-CM | POA: Diagnosis present

## 2016-11-25 DIAGNOSIS — F1010 Alcohol abuse, uncomplicated: Secondary | ICD-10-CM | POA: Diagnosis present

## 2016-11-25 DIAGNOSIS — I959 Hypotension, unspecified: Secondary | ICD-10-CM | POA: Diagnosis not present

## 2016-11-25 DIAGNOSIS — F17210 Nicotine dependence, cigarettes, uncomplicated: Secondary | ICD-10-CM | POA: Diagnosis present

## 2016-11-25 DIAGNOSIS — L259 Unspecified contact dermatitis, unspecified cause: Secondary | ICD-10-CM | POA: Diagnosis present

## 2016-11-25 DIAGNOSIS — R5381 Other malaise: Secondary | ICD-10-CM

## 2016-11-25 DIAGNOSIS — E861 Hypovolemia: Secondary | ICD-10-CM | POA: Diagnosis present

## 2016-11-25 DIAGNOSIS — D649 Anemia, unspecified: Principal | ICD-10-CM | POA: Diagnosis present

## 2016-11-25 DIAGNOSIS — Z8249 Family history of ischemic heart disease and other diseases of the circulatory system: Secondary | ICD-10-CM

## 2016-11-25 DIAGNOSIS — R5383 Other fatigue: Secondary | ICD-10-CM

## 2016-11-25 DIAGNOSIS — F1026 Alcohol dependence with alcohol-induced persisting amnestic disorder: Secondary | ICD-10-CM | POA: Diagnosis present

## 2016-11-25 DIAGNOSIS — J449 Chronic obstructive pulmonary disease, unspecified: Secondary | ICD-10-CM | POA: Diagnosis present

## 2016-11-25 DIAGNOSIS — E871 Hypo-osmolality and hyponatremia: Secondary | ICD-10-CM | POA: Diagnosis not present

## 2016-11-25 DIAGNOSIS — Z87442 Personal history of urinary calculi: Secondary | ICD-10-CM

## 2016-11-25 DIAGNOSIS — Z515 Encounter for palliative care: Secondary | ICD-10-CM

## 2016-11-25 DIAGNOSIS — R627 Adult failure to thrive: Secondary | ICD-10-CM | POA: Diagnosis present

## 2016-11-25 DIAGNOSIS — I10 Essential (primary) hypertension: Secondary | ICD-10-CM | POA: Diagnosis present

## 2016-11-25 DIAGNOSIS — E876 Hypokalemia: Secondary | ICD-10-CM | POA: Diagnosis present

## 2016-11-25 DIAGNOSIS — R000 Tachycardia, unspecified: Secondary | ICD-10-CM | POA: Diagnosis present

## 2016-11-25 DIAGNOSIS — K210 Gastro-esophageal reflux disease with esophagitis: Secondary | ICD-10-CM | POA: Diagnosis present

## 2016-11-25 DIAGNOSIS — F1096 Alcohol use, unspecified with alcohol-induced persisting amnestic disorder: Secondary | ICD-10-CM

## 2016-11-25 DIAGNOSIS — D6959 Other secondary thrombocytopenia: Secondary | ICD-10-CM | POA: Diagnosis present

## 2016-11-25 DIAGNOSIS — D696 Thrombocytopenia, unspecified: Secondary | ICD-10-CM | POA: Diagnosis present

## 2016-11-25 DIAGNOSIS — E878 Other disorders of electrolyte and fluid balance, not elsewhere classified: Secondary | ICD-10-CM

## 2016-11-25 DIAGNOSIS — L89152 Pressure ulcer of sacral region, stage 2: Secondary | ICD-10-CM | POA: Diagnosis present

## 2016-11-25 DIAGNOSIS — Z6822 Body mass index (BMI) 22.0-22.9, adult: Secondary | ICD-10-CM

## 2016-11-25 DIAGNOSIS — R062 Wheezing: Secondary | ICD-10-CM

## 2016-11-25 DIAGNOSIS — E440 Moderate protein-calorie malnutrition: Secondary | ICD-10-CM | POA: Diagnosis present

## 2016-11-25 DIAGNOSIS — R1310 Dysphagia, unspecified: Secondary | ICD-10-CM

## 2016-11-25 DIAGNOSIS — G9341 Metabolic encephalopathy: Secondary | ICD-10-CM | POA: Diagnosis present

## 2016-11-25 DIAGNOSIS — E860 Dehydration: Secondary | ICD-10-CM | POA: Diagnosis present

## 2016-11-25 DIAGNOSIS — Z9114 Patient's other noncompliance with medication regimen: Secondary | ICD-10-CM

## 2016-11-25 DIAGNOSIS — M6282 Rhabdomyolysis: Secondary | ICD-10-CM | POA: Diagnosis present

## 2016-11-25 LAB — GLUCOSE, CAPILLARY
GLUCOSE-CAPILLARY: 304 mg/dL — AB (ref 65–99)
GLUCOSE-CAPILLARY: 252 mg/dL — AB (ref 65–99)

## 2016-11-25 LAB — COMPREHENSIVE METABOLIC PANEL
ALT: 19 U/L (ref 17–63)
AST: 71 U/L — ABNORMAL HIGH (ref 15–41)
Albumin: 3.4 g/dL — ABNORMAL LOW (ref 3.5–5.0)
Alkaline Phosphatase: 45 U/L (ref 38–126)
Anion gap: 16 — ABNORMAL HIGH (ref 5–15)
BUN: 47 mg/dL — ABNORMAL HIGH (ref 6–20)
CHLORIDE: 89 mmol/L — AB (ref 101–111)
CO2: 20 mmol/L — AB (ref 22–32)
Calcium: 7.7 mg/dL — ABNORMAL LOW (ref 8.9–10.3)
Creatinine, Ser: 2.74 mg/dL — ABNORMAL HIGH (ref 0.61–1.24)
GFR calc non Af Amer: 23 mL/min — ABNORMAL LOW (ref >60)
GFR, EST AFRICAN AMERICAN: 27 mL/min — AB (ref >60)
Glucose, Bld: 123 mg/dL — ABNORMAL HIGH (ref 65–99)
Potassium: 3.1 mmol/L — ABNORMAL LOW (ref 3.5–5.1)
SODIUM: 125 mmol/L — AB (ref 135–145)
Total Bilirubin: 1.6 mg/dL — ABNORMAL HIGH (ref 0.3–1.2)
Total Protein: 6.7 g/dL (ref 6.5–8.1)

## 2016-11-25 LAB — I-STAT CG4 LACTIC ACID, ED: LACTIC ACID, VENOUS: 1.26 mmol/L (ref 0.5–1.9)

## 2016-11-25 LAB — CBC WITH DIFFERENTIAL/PLATELET
BASOS PCT: 0 %
Basophils Absolute: 0 10*3/uL (ref 0.0–0.1)
EOS ABS: 0 10*3/uL (ref 0.0–0.7)
Eosinophils Relative: 0 %
HCT: 18.8 % — ABNORMAL LOW (ref 39.0–52.0)
HEMOGLOBIN: 7 g/dL — AB (ref 13.0–17.0)
LYMPHS ABS: 0.7 10*3/uL (ref 0.7–4.0)
Lymphocytes Relative: 10 %
MCH: 33.8 pg (ref 26.0–34.0)
MCHC: 36.7 g/dL — ABNORMAL HIGH (ref 30.0–36.0)
MCV: 92.2 fL (ref 78.0–100.0)
Monocytes Absolute: 1.8 10*3/uL — ABNORMAL HIGH (ref 0.1–1.0)
Monocytes Relative: 26 %
NEUTROS ABS: 4.5 10*3/uL (ref 1.7–7.7)
NEUTROS PCT: 64 %
PLATELETS: 113 10*3/uL — AB (ref 150–400)
RBC: 2.04 MIL/uL — AB (ref 4.22–5.81)
RDW: 14.3 % (ref 11.5–15.5)
WBC: 7.1 10*3/uL (ref 4.0–10.5)

## 2016-11-25 LAB — RETICULOCYTES
RBC.: 2.28 MIL/uL — AB (ref 4.22–5.81)
RETIC COUNT ABSOLUTE: 20.5 10*3/uL (ref 19.0–186.0)
Retic Ct Pct: 0.9 % (ref 0.4–3.1)

## 2016-11-25 LAB — PREPARE RBC (CROSSMATCH)

## 2016-11-25 LAB — RAPID URINE DRUG SCREEN, HOSP PERFORMED
Amphetamines: NOT DETECTED
BARBITURATES: NOT DETECTED
Benzodiazepines: NOT DETECTED
COCAINE: NOT DETECTED
OPIATES: NOT DETECTED
Tetrahydrocannabinol: NOT DETECTED

## 2016-11-25 LAB — AMMONIA: Ammonia: 19 umol/L (ref 9–35)

## 2016-11-25 LAB — CK: CK TOTAL: 1131 U/L — AB (ref 49–397)

## 2016-11-25 LAB — CBC
HCT: 22.8 % — ABNORMAL LOW (ref 39.0–52.0)
Hemoglobin: 8.3 g/dL — ABNORMAL LOW (ref 13.0–17.0)
MCH: 33.6 pg (ref 26.0–34.0)
MCHC: 36.4 g/dL — ABNORMAL HIGH (ref 30.0–36.0)
MCV: 92.3 fL (ref 78.0–100.0)
PLATELETS: 111 10*3/uL — AB (ref 150–400)
RBC: 2.47 MIL/uL — AB (ref 4.22–5.81)
RDW: 14.7 % (ref 11.5–15.5)
WBC: 3.5 10*3/uL — AB (ref 4.0–10.5)

## 2016-11-25 LAB — ACETAMINOPHEN LEVEL

## 2016-11-25 LAB — MAGNESIUM
MAGNESIUM: 1.5 mg/dL — AB (ref 1.7–2.4)
Magnesium: 0.5 mg/dL — CL (ref 1.7–2.4)

## 2016-11-25 LAB — SALICYLATE LEVEL

## 2016-11-25 LAB — MRSA PCR SCREENING: MRSA BY PCR: NEGATIVE

## 2016-11-25 LAB — TSH: TSH: 2.066 u[IU]/mL (ref 0.350–4.500)

## 2016-11-25 LAB — ABO/RH: ABO/RH(D): A POS

## 2016-11-25 LAB — PHOSPHORUS: Phosphorus: 1.9 mg/dL — ABNORMAL LOW (ref 2.5–4.6)

## 2016-11-25 LAB — ETHANOL: Alcohol, Ethyl (B): <5 mg/dL (ref <5)

## 2016-11-25 LAB — OCCULT BLOOD X 1 CARD TO LAB, STOOL: FECAL OCCULT BLD: NEGATIVE

## 2016-11-25 MED ORDER — IPRATROPIUM-ALBUTEROL 0.5-2.5 (3) MG/3ML IN SOLN
3.0000 mL | RESPIRATORY_TRACT | Status: DC | PRN
  Administered 2016-11-26: 3 mL via RESPIRATORY_TRACT
  Filled 2016-11-25: qty 3

## 2016-11-25 MED ORDER — FOLIC ACID 1 MG PO TABS
1.0000 mg | ORAL_TABLET | Freq: Every day | ORAL | Status: DC
  Administered 2016-11-25 – 2016-12-01 (×6): 1 mg via ORAL
  Filled 2016-11-25 (×7): qty 1

## 2016-11-25 MED ORDER — SODIUM CHLORIDE 0.9 % IV SOLN
8.0000 mg/h | INTRAVENOUS | Status: DC
  Administered 2016-11-25 – 2016-11-26 (×3): 8 mg/h via INTRAVENOUS
  Filled 2016-11-25 (×6): qty 80

## 2016-11-25 MED ORDER — INSULIN DETEMIR 100 UNIT/ML ~~LOC~~ SOLN
5.0000 [IU] | Freq: Two times a day (BID) | SUBCUTANEOUS | Status: DC
  Administered 2016-11-25 – 2016-11-26 (×3): 5 [IU] via SUBCUTANEOUS
  Filled 2016-11-25 (×5): qty 0.05

## 2016-11-25 MED ORDER — METOPROLOL SUCCINATE ER 25 MG PO TB24
12.5000 mg | ORAL_TABLET | Freq: Every day | ORAL | Status: DC
  Administered 2016-11-26 – 2016-11-27 (×2): 12.5 mg via ORAL
  Filled 2016-11-25 (×2): qty 1

## 2016-11-25 MED ORDER — IPRATROPIUM BROMIDE 0.02 % IN SOLN
0.5000 mg | Freq: Once | RESPIRATORY_TRACT | Status: AC
  Administered 2016-11-25: 0.5 mg via RESPIRATORY_TRACT
  Filled 2016-11-25: qty 2.5

## 2016-11-25 MED ORDER — ONDANSETRON 4 MG PO TBDP: 4.0000 mg | ORAL_TABLET | Freq: Four times a day (QID) | ORAL | Status: DC | PRN

## 2016-11-25 MED ORDER — ONDANSETRON HCL 4 MG/2ML IJ SOLN: 4.0000 mg | Freq: Four times a day (QID) | INTRAMUSCULAR | Status: DC | PRN

## 2016-11-25 MED ORDER — LORAZEPAM 1 MG PO TABS
0.0000 mg | ORAL_TABLET | Freq: Four times a day (QID) | ORAL | Status: DC
  Administered 2016-11-25: 1 mg via ORAL
  Filled 2016-11-25: qty 1

## 2016-11-25 MED ORDER — POTASSIUM CHLORIDE CRYS ER 20 MEQ PO TBCR
40.0000 meq | EXTENDED_RELEASE_TABLET | Freq: Two times a day (BID) | ORAL | Status: AC
  Administered 2016-11-25 (×2): 40 meq via ORAL
  Filled 2016-11-25 (×2): qty 2

## 2016-11-25 MED ORDER — CHLORDIAZEPOXIDE HCL 25 MG PO CAPS
25.0000 mg | ORAL_CAPSULE | Freq: Three times a day (TID) | ORAL | Status: AC
  Administered 2016-11-26 – 2016-11-27 (×3): 25 mg via ORAL
  Filled 2016-11-25 (×4): qty 1

## 2016-11-25 MED ORDER — MAGNESIUM SULFATE 2 GM/50ML IV SOLN
2.0000 g | Freq: Once | INTRAVENOUS | Status: AC
  Administered 2016-11-25: 2 g via INTRAVENOUS
  Filled 2016-11-25: qty 50

## 2016-11-25 MED ORDER — ALBUTEROL SULFATE (2.5 MG/3ML) 0.083% IN NEBU
5.0000 mg | INHALATION_SOLUTION | Freq: Once | RESPIRATORY_TRACT | Status: AC
  Administered 2016-11-25: 5 mg via RESPIRATORY_TRACT
  Filled 2016-11-25: qty 6

## 2016-11-25 MED ORDER — SODIUM CHLORIDE 0.9 % IV BOLUS (SEPSIS)
1000.0000 mL | Freq: Once | INTRAVENOUS | Status: AC
  Administered 2016-11-25: 1000 mL via INTRAVENOUS

## 2016-11-25 MED ORDER — METHYLPREDNISOLONE SODIUM SUCC 125 MG IJ SOLR
125.0000 mg | Freq: Once | INTRAMUSCULAR | Status: AC
  Administered 2016-11-25: 125 mg via INTRAVENOUS
  Filled 2016-11-25: qty 2

## 2016-11-25 MED ORDER — ACETAMINOPHEN 650 MG RE SUPP: 650.0000 mg | Freq: Four times a day (QID) | RECTAL | Status: DC | PRN

## 2016-11-25 MED ORDER — CHLORDIAZEPOXIDE HCL 25 MG PO CAPS
25.0000 mg | ORAL_CAPSULE | Freq: Four times a day (QID) | ORAL | Status: AC
  Administered 2016-11-25 – 2016-11-26 (×6): 25 mg via ORAL
  Filled 2016-11-25 (×5): qty 1

## 2016-11-25 MED ORDER — ADULT MULTIVITAMIN W/MINERALS CH
1.0000 | ORAL_TABLET | Freq: Every day | ORAL | Status: DC
  Administered 2016-11-25 – 2016-12-01 (×6): 1 via ORAL
  Filled 2016-11-25 (×7): qty 1

## 2016-11-25 MED ORDER — ALBUTEROL SULFATE (2.5 MG/3ML) 0.083% IN NEBU: 2.5000 mg | INHALATION_SOLUTION | RESPIRATORY_TRACT | Status: DC | PRN

## 2016-11-25 MED ORDER — POLYETHYLENE GLYCOL 3350 17 G PO PACK
17.0000 g | PACK | Freq: Two times a day (BID) | ORAL | Status: DC
  Administered 2016-11-25 – 2016-11-26 (×3): 17 g via ORAL
  Filled 2016-11-25 (×3): qty 1

## 2016-11-25 MED ORDER — PANTOPRAZOLE SODIUM 40 MG IV SOLR
40.0000 mg | Freq: Once | INTRAVENOUS | Status: AC
  Administered 2016-11-25: 40 mg via INTRAVENOUS
  Filled 2016-11-25: qty 40

## 2016-11-25 MED ORDER — PANTOPRAZOLE SODIUM 40 MG IV SOLR: 40.0000 mg | Freq: Two times a day (BID) | INTRAVENOUS | Status: DC

## 2016-11-25 MED ORDER — SODIUM CHLORIDE 0.9 % IV SOLN
1.0000 g | Freq: Once | INTRAVENOUS | Status: AC
  Administered 2016-11-25: 1 g via INTRAVENOUS
  Filled 2016-11-25: qty 10

## 2016-11-25 MED ORDER — SODIUM CHLORIDE 0.9 % IV BOLUS (SEPSIS)
2000.0000 mL | Freq: Once | INTRAVENOUS | Status: AC
  Administered 2016-11-25: 2000 mL via INTRAVENOUS

## 2016-11-25 MED ORDER — INSULIN ASPART 100 UNIT/ML ~~LOC~~ SOLN
3.0000 [IU] | Freq: Three times a day (TID) | SUBCUTANEOUS | Status: DC
  Administered 2016-11-25 – 2016-11-26 (×2): 3 [IU] via SUBCUTANEOUS

## 2016-11-25 MED ORDER — POTASSIUM CHLORIDE 10 MEQ/100ML IV SOLN
10.0000 meq | INTRAVENOUS | Status: AC
  Administered 2016-11-25: 10 meq via INTRAVENOUS
  Filled 2016-11-25: qty 100

## 2016-11-25 MED ORDER — NICOTINE 21 MG/24HR TD PT24
21.0000 mg | MEDICATED_PATCH | Freq: Every day | TRANSDERMAL | Status: DC
  Administered 2016-11-25 – 2016-12-01 (×7): 21 mg via TRANSDERMAL
  Filled 2016-11-25 (×7): qty 1

## 2016-11-25 MED ORDER — INSULIN ASPART 100 UNIT/ML ~~LOC~~ SOLN
0.0000 [IU] | Freq: Every day | SUBCUTANEOUS | Status: DC
  Administered 2016-11-25: 3 [IU] via SUBCUTANEOUS

## 2016-11-25 MED ORDER — THIAMINE HCL 100 MG/ML IJ SOLN: 100.0000 mg | Freq: Every day | INTRAMUSCULAR | Status: DC

## 2016-11-25 MED ORDER — VITAMIN B-1 100 MG PO TABS
100.0000 mg | ORAL_TABLET | Freq: Every day | ORAL | Status: DC
  Administered 2016-11-25: 100 mg via ORAL
  Filled 2016-11-25: qty 1

## 2016-11-25 MED ORDER — LOPERAMIDE HCL 2 MG PO CAPS
2.0000 mg | ORAL_CAPSULE | ORAL | Status: AC | PRN
  Administered 2016-11-27: 4 mg via ORAL
  Filled 2016-11-25: qty 2

## 2016-11-25 MED ORDER — ONDANSETRON HCL 4 MG PO TABS
4.0000 mg | ORAL_TABLET | Freq: Four times a day (QID) | ORAL | Status: DC | PRN
  Administered 2016-11-25: 4 mg via ORAL
  Filled 2016-11-25: qty 1

## 2016-11-25 MED ORDER — CHLORDIAZEPOXIDE HCL 25 MG PO CAPS: 25.0000 mg | ORAL_CAPSULE | Freq: Four times a day (QID) | ORAL | Status: AC | PRN

## 2016-11-25 MED ORDER — LORAZEPAM 2 MG/ML IJ SOLN
0.0000 mg | Freq: Two times a day (BID) | INTRAMUSCULAR | Status: DC
Start: 2016-11-27 — End: 2016-11-25

## 2016-11-25 MED ORDER — INSULIN ASPART 100 UNIT/ML ~~LOC~~ SOLN
0.0000 [IU] | Freq: Three times a day (TID) | SUBCUTANEOUS | Status: DC
  Administered 2016-11-25: 7 [IU] via SUBCUTANEOUS
  Administered 2016-11-26: 1 [IU] via SUBCUTANEOUS

## 2016-11-25 MED ORDER — LORAZEPAM 2 MG/ML IJ SOLN: 0.0000 mg | Freq: Four times a day (QID) | INTRAMUSCULAR | Status: DC

## 2016-11-25 MED ORDER — VITAMIN B-1 100 MG PO TABS
100.0000 mg | ORAL_TABLET | Freq: Every day | ORAL | Status: DC
  Administered 2016-11-26 – 2016-12-01 (×5): 100 mg via ORAL
  Filled 2016-11-25 (×6): qty 1

## 2016-11-25 MED ORDER — POTASSIUM PHOSPHATE MONOBASIC 500 MG PO TABS
500.0000 mg | ORAL_TABLET | Freq: Three times a day (TID) | ORAL | Status: DC
  Administered 2016-11-25 (×3): 500 mg via ORAL
  Filled 2016-11-25 (×4): qty 1

## 2016-11-25 MED ORDER — CHLORDIAZEPOXIDE HCL 25 MG PO CAPS
25.0000 mg | ORAL_CAPSULE | ORAL | Status: AC
  Administered 2016-11-27 – 2016-11-28 (×2): 25 mg via ORAL
  Filled 2016-11-25 (×2): qty 1

## 2016-11-25 MED ORDER — MAGNESIUM SULFATE 2 GM/50ML IV SOLN
2.0000 g | Freq: Once | INTRAVENOUS | Status: DC
  Filled 2016-11-25: qty 50

## 2016-11-25 MED ORDER — MAGNESIUM OXIDE 400 (241.3 MG) MG PO TABS
400.0000 mg | ORAL_TABLET | Freq: Two times a day (BID) | ORAL | Status: AC
  Administered 2016-11-25 (×2): 400 mg via ORAL
  Filled 2016-11-25 (×3): qty 1

## 2016-11-25 MED ORDER — MAGNESIUM SULFATE 2 GM/50ML IV SOLN
2.0000 g | INTRAVENOUS | Status: AC
  Administered 2016-11-25: 2 g via INTRAVENOUS
  Filled 2016-11-25: qty 50

## 2016-11-25 MED ORDER — ONDANSETRON HCL 4 MG PO TABS: 4.0000 mg | ORAL_TABLET | Freq: Three times a day (TID) | ORAL | Status: DC | PRN

## 2016-11-25 MED ORDER — SODIUM CHLORIDE 0.9 % IV SOLN
Freq: Once | INTRAVENOUS | Status: AC
  Administered 2016-11-25: 11:00:00 via INTRAVENOUS

## 2016-11-25 MED ORDER — THIAMINE HCL 100 MG/ML IJ SOLN: 100.0000 mg | Freq: Once | INTRAMUSCULAR | Status: DC

## 2016-11-25 MED ORDER — ZINC OXIDE 40 % EX OINT
TOPICAL_OINTMENT | Freq: Two times a day (BID) | CUTANEOUS | Status: DC
  Administered 2016-11-25 (×2): 57 via TOPICAL
  Administered 2016-11-26: 1 via TOPICAL
  Administered 2016-11-26 – 2016-11-28 (×4): via TOPICAL
  Administered 2016-11-29: 57 via TOPICAL
  Filled 2016-11-25 (×3): qty 57

## 2016-11-25 MED ORDER — CHLORDIAZEPOXIDE HCL 25 MG PO CAPS
25.0000 mg | ORAL_CAPSULE | Freq: Every day | ORAL | Status: AC
  Administered 2016-11-28: 25 mg via ORAL
  Filled 2016-11-25: qty 1

## 2016-11-25 MED ORDER — POTASSIUM CHLORIDE 10 MEQ/100ML IV SOLN
10.0000 meq | INTRAVENOUS | Status: AC
  Administered 2016-11-25 (×2): 10 meq via INTRAVENOUS
  Filled 2016-11-25 (×2): qty 100

## 2016-11-25 MED ORDER — ACETAMINOPHEN 325 MG PO TABS: 650.0000 mg | ORAL_TABLET | Freq: Four times a day (QID) | ORAL | Status: DC | PRN

## 2016-11-25 MED ORDER — SODIUM CHLORIDE 0.9 % IV SOLN
INTRAVENOUS | Status: DC
  Administered 2016-11-25 – 2016-11-27 (×5): via INTRAVENOUS

## 2016-11-25 MED ORDER — HYDROXYZINE HCL 25 MG PO TABS
25.0000 mg | ORAL_TABLET | Freq: Four times a day (QID) | ORAL | Status: AC | PRN
Start: 2016-11-25 — End: 2016-11-28
  Administered 2016-11-27: 25 mg via ORAL
  Filled 2016-11-25: qty 1

## 2016-11-25 MED ORDER — LORAZEPAM 1 MG PO TABS: 0.0000 mg | ORAL_TABLET | Freq: Two times a day (BID) | ORAL | Status: DC

## 2016-11-25 MED ORDER — HYDROCODONE-ACETAMINOPHEN 5-325 MG PO TABS
1.0000 | ORAL_TABLET | Freq: Four times a day (QID) | ORAL | Status: DC | PRN
  Administered 2016-11-25 – 2016-11-26 (×2): 1 via ORAL
  Administered 2016-11-26: 2 via ORAL
  Administered 2016-11-26 – 2016-11-28 (×3): 1 via ORAL
  Administered 2016-11-29 – 2016-12-01 (×4): 2 via ORAL
  Filled 2016-11-25: qty 2
  Filled 2016-11-25: qty 1
  Filled 2016-11-25 (×2): qty 2
  Filled 2016-11-25: qty 1
  Filled 2016-11-25: qty 2
  Filled 2016-11-25: qty 1
  Filled 2016-11-25: qty 2
  Filled 2016-11-25 (×2): qty 1
  Filled 2016-11-25: qty 2

## 2016-11-25 NOTE — ED Notes (Signed)
Bed: TY75 Expected date:  Expected time:  Means of arrival:  Comments: EMS 66 yo male failure to thrive/SBP 82

## 2016-11-25 NOTE — ED Provider Notes (Signed)
[facility] DEPT Provider Note   CSN: [id number] Arrival date & time: 11/25/16  0057     History   Chief Complaint Chief Complaint  Patient presents with  . Failure To Thrive  . Alcohol Intoxication  . Hypotension    HPI Patrick [name] is a 66 y.o. male.  66 year old male with a history of depression, esophageal reflux, and alcohol use presents to the emergency department by EMS. IVC reportedly taken out by family as patient has been noncompliant with his medications and neglecting to take care of himself. Patient reports that he has felt increasingly weak and fatigued over the past few months. He states that he is no longer able to get himself upright or ambulate independently. He does report drinking daily, usually a few glasses of gin and tonic. Note from EMS reports that patient has been drinking instead of eating. He was hypotensive on EMS arrival and administered 500 mL of normal saline. No also reports recent diagnosis with urinary tract infection. Patient denies any known fevers. He has no complaints of pain.      Past Medical History:  Diagnosis Date  . Colon polyps   . Depression   . Diverticulitis   . Diverticulosis of colon   . GERD (gastroesophageal reflux disease)   . Kidney stone     Patient Active Problem List   Diagnosis Date Noted  . Erosive esophagitis 05/14/2015  . Hypokalemia 04/30/2015  . Thrombocytopenia ([place]) 04/16/2015  . Alcohol dependence ([place]) 03/17/2015  . Change in bowel habits 02/23/2015  . Rectal bleeding 02/23/2015  . Constipation 02/23/2015  . Transaminasemia 03/27/2013  . Essential tremor 03/27/2013  . Hypertension 07/19/2012  . TOBACCO USE 03/01/2010  . DIVERTICULOSIS, COLON 05/14/2007  . DEPRESSION 11/05/2006  . COLONIC POLYPS, HX OF 11/05/2006    Past Surgical History:  Procedure Laterality Date  . COLONOSCOPY    . UPPER GASTROINTESTINAL ENDOSCOPY         Home Medications    Prior to Admission medications     Medication Sig Start Date End Date Taking? Authorizing Provider  albuterol (VENTOLIN HFA) 108 (90 BASE) MCG/ACT inhaler Inhale 2 puffs into the lungs every 4 (four) hours as needed for wheezing or shortness of breath. 08/17/14  Yes Burchette, Alinda Sierras, MD  amLODipine (NORVASC) 5 MG tablet TAKE 1 TABLET BY MOUTH DAILY. 10/18/16  Yes Burchette, Alinda Sierras, MD  losartan (COZAAR) 100 MG tablet TAKE 1 TABLET BY MOUTH DAILY. 10/18/16  Yes Burchette, Alinda Sierras, MD  metoprolol succinate (TOPROL-XL) 50 MG 24 hr tablet TAKE ONE TABLET BY MOUTH ONE TIME DAILY WITH OR IMMEDIATELY FOLLOWING A MEAL 07/12/16  Yes Burchette, Alinda Sierras, MD  pantoprazole (PROTONIX) 40 MG tablet TAKE 1 TABLET (40 MG TOTAL) BY MOUTH DAILY. **NEED OFFICE VISIT FOR MORE REFILLS** 09/07/16  Yes Burchette, Alinda Sierras, MD  polyethylene glycol powder (GLYCOLAX/MIRALAX) powder Take 17 grams in 8 oz of water or juice daily. Patient taking differently: Take 1 Container by mouth daily as needed for mild constipation or moderate constipation. Take 17 grams in 8 oz of water or juice 02/23/15  Yes Zehr, Janett Billow D, PA-C  triamcinolone cream (KENALOG) 0.1 % Apply 1 application topically 2 (two) times daily as needed. Patient taking differently: Apply 1 application topically 2 (two) times daily as needed (rash).  03/17/15  Yes Burchette, Alinda Sierras, MD  Omeprazole-Sodium Bicarbonate (ZEGERID) 20-1100 MG CAPS capsule Take 1 capsule by mouth at bedtime. Patient not taking: Reported on 11/25/2016 01/15/15  Gatha Mayer, MD    Family History Family History  Problem Relation Age of Onset  . Heart disease Father 47       cabg    Social History Social History  Substance Use Topics  . Smoking status: Current Every Day Smoker    Packs/day: 1.00    Types: Cigarettes  . Smokeless tobacco: Never Used  . Alcohol use 8.4 oz/week    14 Glasses of wine per week     Allergies   Patient has no known allergies.   Review of Systems Review of Systems Ten systems  reviewed and are negative for acute change, except as noted in the HPI.    Physical Exam Updated Vital Signs BP 112/63   Pulse 100   Temp 99.5 F (37.5 C) (Oral)   Resp 17   Ht 5\' 8"  (1.727 m)   Wt 65.8 kg (145 lb)   SpO2 97%   BMI 22.05 kg/m   Physical Exam  Constitutional: He is oriented to person, place, and time. He appears well-developed and well-nourished. No distress.  Chronically ill-appearing male. Patient in NAD.  HENT:  Head: Normocephalic and atraumatic.  Eyes: Conjunctivae and EOM are normal. No scleral icterus.  Neck: Normal range of motion.  Cardiovascular: Normal rate, regular rhythm and intact distal pulses.   Pulmonary/Chest: Effort normal. No respiratory distress. He has wheezes. He has no rales.  Diffuse expiratory wheeze. Decreased breath sounds throughout.  Abdominal: Soft. He exhibits distension. There is no tenderness. There is no guarding.  Soft, distended abdomen. No tenderness.  Musculoskeletal: Normal range of motion.  Neurological: He is alert and oriented to person, place, and time. He exhibits normal muscle tone. Coordination normal.  GCS 15. Speech is goal oriented. Patient answers questions appropriately and follows commands. No asterixis. Patient moving all extremities. Sensation to light touch intact.  Skin: Skin is warm and dry. No rash noted. He is not diaphoretic. No erythema. No pallor.  Psychiatric: He has a normal mood and affect. His behavior is normal.  Nursing note and vitals reviewed.    ED Treatments / Results  Labs (all labs ordered are listed, but only abnormal results are displayed) Labs Reviewed  CBC WITH DIFFERENTIAL/PLATELET - Abnormal; Notable for the following:       Result Value   RBC 2.04 (*)    Hemoglobin 7.0 (*)    HCT 18.8 (*)    MCHC 36.7 (*)    Platelets 113 (*)    Monocytes Absolute 1.8 (*)    All other components within normal limits  COMPREHENSIVE METABOLIC PANEL - Abnormal; Notable for the following:      Sodium 125 (*)    Potassium 3.1 (*)    Chloride 89 (*)    CO2 20 (*)    Glucose, Bld 123 (*)    BUN 47 (*)    Creatinine, Ser 2.74 (*)    Calcium 7.7 (*)    Albumin 3.4 (*)    AST 71 (*)    Total Bilirubin 1.6 (*)    GFR calc non Af Amer 23 (*)    GFR calc Af Amer 27 (*)    Anion gap 16 (*)    All other components within normal limits  ACETAMINOPHEN LEVEL - Abnormal; Notable for the following:    Acetaminophen (Tylenol), Serum <10 (*)    All other components within normal limits  CK - Abnormal; Notable for the following:    Total CK 1,131 (*)  All other components within normal limits  MAGNESIUM - Abnormal; Notable for the following:    Magnesium 0.5 (*)    All other components within normal limits  SALICYLATE LEVEL  ETHANOL  AMMONIA  RAPID URINE DRUG SCREEN, HOSP PERFORMED  I-STAT CG4 LACTIC ACID, ED  CBG MONITORING, ED  POC OCCULT BLOOD, ED  TYPE AND SCREEN  ABO/RH    EKG  EKG Interpretation  Date/Time:  Saturday November 25 2016 02:53:17 EDT Ventricular Rate:  94 PR Interval:    QRS Duration: 100 QT Interval:  359 QTC Calculation: 449 R Axis:   80 Text Interpretation:  Sinus rhythm Atrial premature complex Short PR interval No acute changes Nonspecific ST and T wave abnormality No old tracing to compare Confirmed by Varney Biles [phone]) on 11/25/2016 3:36:13 AM       Radiology Dg Chest 2 View  Result Date: 11/25/2016 CLINICAL DATA:  Cough and wheezing.  Hypotensive. EXAM: CHEST  2 VIEW COMPARISON:  Chest radiograph April 26, 2011 FINDINGS: Increased lung volumes with apical bullous changes. Cardiac silhouette is normal in size. Mediastinal silhouette is nonsuspicious, mildly calcified aortic knob. Small to moderate hiatal hernia. No pleural effusion or focal consolidation. No pneumothorax. Soft tissue planes and included osseous structures are nonsuspicious. IMPRESSION: COPD, no acute cardiopulmonary process. Small to moderate hiatal hernia.  Electronically Signed   By: Elon Alas M.D.   On: 11/25/2016 03:14    Procedures Procedures (including critical care time)  Medications Ordered in ED Medications  LORazepam (ATIVAN) injection 0-4 mg ( Intravenous See Alternative 11/25/16 0326)    Or  LORazepam (ATIVAN) tablet 0-4 mg (1 mg Oral Given 11/25/16 0326)  LORazepam (ATIVAN) injection 0-4 mg (not administered)    Or  LORazepam (ATIVAN) tablet 0-4 mg (not administered)  thiamine (VITAMIN B-1) tablet 100 mg (not administered)    Or  thiamine (B-1) injection 100 mg (not administered)  ondansetron (ZOFRAN) tablet 4 mg (not administered)  nicotine (NICODERM CQ - dosed in mg/24 hours) patch 21 mg (not administered)  potassium chloride 10 mEq in 100 mL IVPB (10 mEq Intravenous New Bag/Given 11/25/16 0400)  magnesium sulfate IVPB 2 g 50 mL (not administered)  sodium chloride 0.9 % bolus 1,000 mL (0 mLs Intravenous Stopped 11/25/16 0326)  albuterol (PROVENTIL) (2.5 MG/3ML) 0.083% nebulizer solution 5 mg (5 mg Nebulization Given 11/25/16 0316)  sodium chloride 0.9 % bolus 1,000 mL (1,000 mLs Intravenous New Bag/Given 11/25/16 0326)  albuterol (PROVENTIL) (2.5 MG/3ML) 0.083% nebulizer solution 5 mg (5 mg Nebulization Given 11/25/16 0357)  ipratropium (ATROVENT) nebulizer solution 0.5 mg (0.5 mg Nebulization Given 11/25/16 0408)  methylPREDNISolone sodium succinate (SOLU-MEDROL) 125 mg/2 mL injection 125 mg (125 mg Intravenous Given 11/25/16 0358)  pantoprazole (PROTONIX) injection 40 mg (40 mg Intravenous Given 11/25/16 0359)    CRITICAL CARE Performed by: Antonietta Breach   Total critical care time: 45 minutes  Critical care time was exclusive of separately billable procedures and treating other patients.  Critical care was necessary to treat or prevent imminent or life-threatening deterioration.  Critical care was time spent personally by me on the following activities: development of treatment plan with patient and/or surrogate as  well as nursing, discussions with consultants, evaluation of patient's response to treatment, examination of patient, obtaining history from patient or surrogate, ordering and performing treatments and interventions, ordering and review of laboratory studies, ordering and review of radiographic studies, pulse oximetry and re-evaluation of patient's condition.   Initial Impression / Assessment and Plan /  ED Course  I have reviewed the triage vital signs and the nursing notes.  Pertinent labs & imaging results that were available during my care of the patient were reviewed by me and considered in my medical decision making (see chart for details).      66 year old male presents under involuntary commitment taken out by family. He is to be admitted for failure to thrive with new onset anemia and acute renal failure. Anemia possibly due to chronic alcohol use, though he will call pending to assess for GI bleed. No signs of acute blood loss as patient is hemodynamically stable. Patient with multiple electrolyte derangements. He has been hydrated in the emergency department. IV potassium ordered for hypokalemia. Patient with no complaints of pain. Dr. Tamala Julian of [facility] to assess for admission.   Final Clinical Impressions(s) / ED Diagnoses   Final diagnoses:  Malaise and fatigue  Symptomatic anemia  Acute renal failure, unspecified acute renal failure type ([place])  Hypokalemia  Hypochloremia  Hyponatremia  Hypomagnesemia    New Prescriptions New Prescriptions   No medications on file     Antonietta Breach, PA-C 11/25/16 0349    Antonietta Breach, PA-C 11/25/16 Baruch Merl, MD 11/27/16 1435

## 2016-11-25 NOTE — ED Notes (Signed)
Writer made two unsuccessful attempts to draw labs. RN notified.

## 2016-11-25 NOTE — ED Notes (Signed)
Writer states they asked patient to give urine specimen again and patient states they are still unable to give urine specimen. Urinal at bedside.

## 2016-11-25 NOTE — ED Triage Notes (Signed)
Per EMS , pt from home, IVC by family due to non compliant to is medication and neglect to take care  Of  self. Pt. Reported by family that pt. Been drinking gin instead of eating and taking his medications noted for 3 days now.hypotensive , received 557ml of NS via EMS.pt.recently dx with UTI.

## 2016-11-25 NOTE — H&P (Addendum)
History and Physical    Patrick [name]:[id number] DOB: 03/12/1951 DOA: 11/25/2016  Referring MD/NP/PA: Antonietta Breach, PA-C PCP: [name], MD  Patient coming from: home  Chief Complaint: Failure to thrive  HPI: Patrick [name] is a 66 y.o. male with medical history significant of alcohol abuse, depression, and GERD; who was reportedly involuntary committed by family members for inability to care for herself. Patient is a poor historian. He had reportedly been sitting in the floor for 3 days in his own feces and urine as he was too weak to get up and move. Patient admits to drinking a few glasses of gin and tonic daily and complained of some intermittent dark stools. Patient denies having any significant fevers, chills, nausea, vomiting, or chest pain.  ED Course: Upon admission into the emergency department patient was seen to be afebrile, pulse 93-110, respiration 14-21, blood pressures as low as 98/60, and O2 saturation maintained on room air. Labs revealed hemoglobin 7, platelets 113.  Review of Systems: Review of Systems  Unable to perform ROS: Mental status change  Constitutional: Positive for malaise/fatigue. Negative for chills and fever.  Gastrointestinal: Positive for blood in stool.  Skin: Positive for rash.  Neurological: Positive for weakness.    Past Medical History:  Diagnosis Date  . Colon polyps   . Depression   . Diverticulitis   . Diverticulosis of colon   . GERD (gastroesophageal reflux disease)   . Kidney stone     Past Surgical History:  Procedure Laterality Date  . COLONOSCOPY    . UPPER GASTROINTESTINAL ENDOSCOPY       reports that he has been smoking Cigarettes.  He has been smoking about 1.00 pack per day. He has never used smokeless tobacco. He reports that he drinks about 8.4 oz of alcohol per week . He reports that he does not use drugs.  No Known Allergies  Family History  Problem Relation Age of Onset  . Heart disease Father 74    cabg    Prior to Admission medications   Medication Sig Start Date End Date Taking? Authorizing Provider  albuterol (VENTOLIN HFA) 108 (90 BASE) MCG/ACT inhaler Inhale 2 puffs into the lungs every 4 (four) hours as needed for wheezing or shortness of breath. 08/17/14  Yes Burchette, Alinda Sierras, MD  amLODipine (NORVASC) 5 MG tablet TAKE 1 TABLET BY MOUTH DAILY. 10/18/16  Yes Burchette, Alinda Sierras, MD  losartan (COZAAR) 100 MG tablet TAKE 1 TABLET BY MOUTH DAILY. 10/18/16  Yes Burchette, Alinda Sierras, MD  metoprolol succinate (TOPROL-XL) 50 MG 24 hr tablet TAKE ONE TABLET BY MOUTH ONE TIME DAILY WITH OR IMMEDIATELY FOLLOWING A MEAL 07/12/16  Yes Burchette, Alinda Sierras, MD  pantoprazole (PROTONIX) 40 MG tablet TAKE 1 TABLET (40 MG TOTAL) BY MOUTH DAILY. **NEED OFFICE VISIT FOR MORE REFILLS** 09/07/16  Yes Burchette, Alinda Sierras, MD  polyethylene glycol powder (GLYCOLAX/MIRALAX) powder Take 17 grams in 8 oz of water or juice daily. Patient taking differently: Take 1 Container by mouth daily as needed for mild constipation or moderate constipation. Take 17 grams in 8 oz of water or juice 02/23/15  Yes Zehr, Janett Billow D, PA-C  triamcinolone cream (KENALOG) 0.1 % Apply 1 application topically 2 (two) times daily as needed. Patient taking differently: Apply 1 application topically 2 (two) times daily as needed (rash).  03/17/15  Yes Burchette, Alinda Sierras, MD  Omeprazole-Sodium Bicarbonate (ZEGERID) 20-1100 MG CAPS capsule Take 1 capsule by mouth at bedtime. Patient not  taking: Reported on 11/25/2016 01/15/15   Gatha Mayer, MD    Physical Exam:  Constitutional: Disheveled male who appears older than stated age 81:   11/25/16 0314 11/25/16 0320 11/25/16 0330 11/25/16 0400  BP:  109/74 98/60 112/63  Pulse:  (!) 101 100   Resp:   18 17  Temp:      TempSrc:      SpO2: 100%  97%   Weight:      Height:       Eyes: PERRL, lids and conjunctivae normal ENMT: Mucous membranes are dry. Posterior pharynx clear of any exudate or  lesions.  Neck: normal, supple, no masses, no thyromegaly Respiratory: clear to auscultation bilaterally, no wheezing, no crackles. Normal respiratory effort. No accessory muscle use.  Cardiovascular: Regular rate and rhythm, no murmurs / rubs / gallops. No extremity edema. 2+ pedal pulses. No carotid bruits.  Abdomen: no tenderness, no masses palpated. No hepatosplenomegaly. Bowel sounds positive.  Musculoskeletal: no clubbing / cyanosis. No joint deformity upper and lower extremities. Good ROM, no contractures. Normal muscle tone.  Rectal exam: Rectal exam performed with nurse present at bedside as chaperone. Stool guaiac positive for signs of blood. Skin: Stage II pressure ulcer of the sacrum. Neurologic: CN 2-12 grossly intact. Sensation intact, DTR normal. Strength 4/5 in all 4.  Psychiatric: Lethargic but arousable. Unable to fully assess at this time.   Labs on Admission: I have personally reviewed following labs and imaging studies  CBC:  Recent Labs Lab 11/25/16 0210  WBC 7.1  NEUTROABS 4.5  HGB 7.0*  HCT 18.8*  MCV 92.2  PLT 706*   Basic Metabolic Panel:  Recent Labs Lab 11/25/16 0210 11/25/16 0340  NA 125*  --   K 3.1*  --   CL 89*  --   CO2 20*  --   GLUCOSE 123*  --   BUN 47*  --   CREATININE 2.74*  --   CALCIUM 7.7*  --   MG  --  0.5*   GFR: Estimated Creatinine Clearance: 25.7 mL/min (A) (by C-G formula based on SCr of 2.74 mg/dL (H)). Liver Function Tests:  Recent Labs Lab 11/25/16 0210  AST 71*  ALT 19  ALKPHOS 45  BILITOT 1.6*  PROT 6.7  ALBUMIN 3.4*   No results for input(s): LIPASE, AMYLASE in the last 168 hours.  Recent Labs Lab 11/25/16 0335  AMMONIA 19   Coagulation Profile: No results for input(s): INR, PROTIME in the last 168 hours. Cardiac Enzymes:  Recent Labs Lab 11/25/16 0340  CKTOTAL 1,131*   BNP (last 3 results) No results for input(s): PROBNP in the last 8760 hours. HbA1C: No results for input(s): HGBA1C in the  last 72 hours. CBG: No results for input(s): GLUCAP in the last 168 hours. Lipid Profile: No results for input(s): CHOL, HDL, LDLCALC, TRIG, CHOLHDL, LDLDIRECT in the last 72 hours. Thyroid Function Tests: No results for input(s): TSH, T4TOTAL, FREET4, T3FREE, THYROIDAB in the last 72 hours. Anemia Panel: No results for input(s): VITAMINB12, FOLATE, FERRITIN, TIBC, IRON, RETICCTPCT in the last 72 hours. Urine analysis:    Component Value Date/Time   COLORURINE yellow 02/21/2010 1055   APPEARANCEUR Clear 02/21/2010 1055   LABSPEC 1.025 02/21/2010 1055   PHURINE 5.0 02/21/2010 1055   HGBUR negative 02/21/2010 1055   BILIRUBINUR n 03/11/2014 1025   PROTEINUR n 03/11/2014 1025   UROBILINOGEN 0.2 03/11/2014 1025   UROBILINOGEN 0.2 02/21/2010 1055   NITRITE n 03/11/2014 1025  NITRITE negative 02/21/2010 1055   LEUKOCYTESUR Negative 03/11/2014 1025   Sepsis Labs: No results found for this or any previous visit (from the past 240 hour(s)).   Radiological Exams on Admission: Dg Chest 2 View  Result Date: 11/25/2016 CLINICAL DATA:  Cough and wheezing.  Hypotensive. EXAM: CHEST  2 VIEW COMPARISON:  Chest radiograph April 26, 2011 FINDINGS: Increased lung volumes with apical bullous changes. Cardiac silhouette is normal in size. Mediastinal silhouette is nonsuspicious, mildly calcified aortic knob. Small to moderate hiatal hernia. No pleural effusion or focal consolidation. No pneumothorax. Soft tissue planes and included osseous structures are nonsuspicious. IMPRESSION: COPD, no acute cardiopulmonary process. Small to moderate hiatal hernia. Electronically Signed   By: Elon Alas M.D.   On: 11/25/2016 03:14    EKG: Independently reviewed. Sinus rhythm with premature atrial complexes   Assessment/Plan Symptomatic anemia with suspected GI bleed: Acute. Patient presents with hemoglobin of 7, with his baseline previously noted to be 12.5 in 04/2015. Stool guaiac was positive for  blood. - Admit to stepdown - NPO - Protonix drip per protocol - T&S for need of blood products and transfuse 1 unit of packed red blood cells - Will need serial monitoring of H&H - Will need Consult gastroenterology for further evaluation in a.m.   Acute renal failure:  Patient presents with a creatinine of 2.74 and BUN of 47. His baseline creatinine previously had been within normal limits. Suspect likely related with hypovolemia. - IV fluids of normal saline at 75 ml/hr - Check renal ultrasound   Mild rhabdomyolysis: Acute. Initial CPK 1131. - Continue IV fluids as seen above  Alcohol abuse: Patient reports drinking gin and tonic daily. - CWIA protocol initiated  COPD - DuoNeb prn SOB/wheezing  Electrolyte abnormalities(hypokalemia, hypomagnesemia, hypocalcemia): Acute. Patient was given 2 g of magnesium sulfate and 20 mEq of potassium chloride  - Give 1 g calcium gluconate and 20 mEq of potassium chloride IV - Continue to monitor and replace electrolytes as needed  Hyponatremia: Initial sodium 125 on admission. Patient was given 2 L of normal saline IV fluids on the ED. - Recheck BMP - Goal to not overcorrect sodium more than 10 mEq over a 24-hour period - Adjust fluids as needed  Essential hypertension - Hold blood pressure medications of amlodipine, Cozaar, and metoprolol  Involuntary commitment by family: For inability to care for himself.   Elevated AST: AST 71 and ALT 19 on admission. Ratio greater than 2 to suggest alcohol abuse.  Stage II pressure ulcer sacral region: Acute. - Low-air-loss mattress - Wound care consult  Thrombocytopenia: Chronic. Platelet count 113 - Continue to monitor DVT prophylaxis: SCD  Code Status: Full Family Communication: No family present at bedside Disposition Plan:TBD Consults called: none Admission status: Inpatient   Norval Morton MD [facility] Pager [phone]   If 7PM-7AM, please contact  night-coverage [email or web address] Password [facility]  11/25/2016, 4:32 AM

## 2016-11-25 NOTE — Progress Notes (Signed)
Patient has order to transfuse one unit of PRBC.  Unable to stay awake long enough to sign consent form.  Attempted to call Everlene Farrier (listed on contact info in chart), no answer at home number, cell number goes to voicemail, however mailbox is full.  Will attempt to call at a later time.

## 2016-11-25 NOTE — Consult Note (Signed)
[facility] Nurse wound consult note Reason for Consult: Moisture associated skin damage (incontinence) from prolonged exposure to feces.  Was found down at home after a prolonged period.  Bilateral buttocks in gluteal fold are denuded and tender to touch.  Friable and bleed when cleansed. Has condom cath in place today and able to verbalize when he needs the bedpan.  Wound type:MASD (ncontinence associtaed) Pressure Injury POA: Yes  Pressure and moisture to buttocks Measurement: 8 cm x 4.5 cm x 0.1 cm denuded skin to bilateral buttocks in gluteal fold.  Wound DHD:IXBO and moist Drainage (amount, consistency, odor) scant serosanguinous weeping.  No odor.  Periwound:intact Dressing procedure/placement/frequency:Cleanse buttocks with soap and water and pat gently dry. Apply Boudreaux butt paste twice daily.  Prompt incontinence care.  Will not follow at this time.  Please re-consult if needed.  Domenic Moras RN BSN [place] Pager [phone]

## 2016-11-25 NOTE — Progress Notes (Addendum)
[facility] PROGRESS NOTE    Progress Note  Patrick [name]  NID:[id number] DOB: [birth year] DOA: 11/25/2016 PCP: [name], MD     Brief Narrative:   BRAD LIEURANCE is an 66 y.o. male past medical history of alcohol abuse depression was involuntarily committed by family members her inability to care for himself patient is a poor historian and he is encephalopathic has been sitting at home for 3 days and his face use any urine.  Assessment/Plan:   Symptomatic anemia: Admission with a hemoglobin of 7, in January of last year was 12. The patient relates melanotic stools about 3 days prior to admission, ? reliability. We'll check FOBT's. No further bowel movement. We'll give MiraLAX monitor for any melanotic stools. He is on IV Protonix. Has been transfuse 1 unit of packed red blood cells.  Acute kidney injury: In the setting of ARB use. Previous creatinine in February 2017 was less than 1. Start him on aggressive IV fluid hydration will be given to additional liter bolus, and 1 unit of packed blood cells  Mild rhabdomyolysis: With a CPK of 1109 non- traumatic, aggressive IV fluid hydration.  EtOH abuse: Continue thiamine and folate, monitor with CIWA protocol.  Electrolyte abnormalities: Hyponatremia, hypokalemia, hypomagnesemia and hypokalemia: Seems to be prerenal urinary sodium and creatinine were not checked on admission. Statistically this would be due to hypovolemia. We'll start him on aggressive IV fluid hydration and repeat potassium and magnesium orally. Check renal panel in the morning. Check phosphorus level, he seems to be severely malnourished, concern for refeeding syndrome.  Essential hypertension: Hold oral antihypertensive medication.  Transaminitis: With a 2:1 ratio AST 2 ALT likely due to alcohol.  Stage II pressure ulcer: Wound care.  Thrombocytopenia: Likely due to alcohol abuse.   DVT prophylaxis: SCD's Family  Communication:none Disposition Plan/Barrier to D/C: unable to determine Code Status:     Code Status Orders        Start     Ordered   11/25/16 0454  Full code  Continuous     11/25/16 0458    Code Status History    Date Active Date Inactive Code Status Order ID Comments User Context   11/25/2016  1:40 AM 11/25/2016  4:58 AM Full Code [id number]  Antonietta Breach, PA-C ED        IV Access:    Peripheral IV   Procedures and diagnostic studies:   Dg Chest 2 View  Result Date: 11/25/2016 CLINICAL DATA:  Cough and wheezing.  Hypotensive. EXAM: CHEST  2 VIEW COMPARISON:  Chest radiograph April 26, 2011 FINDINGS: Increased lung volumes with apical bullous changes. Cardiac silhouette is normal in size. Mediastinal silhouette is nonsuspicious, mildly calcified aortic knob. Small to moderate hiatal hernia. No pleural effusion or focal consolidation. No pneumothorax. Soft tissue planes and included osseous structures are nonsuspicious. IMPRESSION: COPD, no acute cardiopulmonary process. Small to moderate hiatal hernia. Electronically Signed   By: Elon Alas M.D.   On: 11/25/2016 03:14     Medical Consultants:    None.  Anti-Infectives:   None  Subjective:    Nat Christen no new complains.  Objective:    Vitals:   11/25/16 1200 11/25/16 1230 11/25/16 1300 11/25/16 1330  BP: 107/62 123/88 (!) 99/52 (!) 96/59  Pulse: (!) 110 (!) 120 (!) 110 (!) 110  Resp: (!) 21 (!) 21 (!) 21 19  Temp:      TempSrc:      SpO2: 98% 94% 100% 99%  Weight:      Height:        Intake/Output Summary (Last 24 hours) at 11/25/16 1401 Last data filed at 11/25/16 1346  Gross per 24 hour  Intake             6720 ml  Output             1850 ml  Net             4870 ml   Filed Weights   11/25/16 0104  Weight: 65.8 kg (145 lb)    Exam: General exam: In no acute distress. Respiratory system: Good air movement and clear to auscultation. Cardiovascular system: S1 & S2 heard, RRR.  No JVD, murmurs, rubs, gallops or clicks.  Gastrointestinal system: Abdomen is nondistended, soft and nontender.  Central nervous system: Alert and oriented. No focal neurological deficits. Extremities: No pedal edema. Skin: No rashes, lesions or ulcers Psychiatry: Judgement and insight appear normal. Mood & affect appropriate.    Data Reviewed:    Labs: Basic Metabolic Panel:  Recent Labs Lab 11/25/16 0210 11/25/16 0340  NA 125*  --   K 3.1*  --   CL 89*  --   CO2 20*  --   GLUCOSE 123*  --   BUN 47*  --   CREATININE 2.74*  --   CALCIUM 7.7*  --   MG  --  0.5*   GFR Estimated Creatinine Clearance: 25.7 mL/min (A) (by C-G formula based on SCr of 2.74 mg/dL (H)). Liver Function Tests:  Recent Labs Lab 11/25/16 0210  AST 71*  ALT 19  ALKPHOS 45  BILITOT 1.6*  PROT 6.7  ALBUMIN 3.4*   No results for input(s): LIPASE, AMYLASE in the last 168 hours.  Recent Labs Lab 11/25/16 0335  AMMONIA 19   Coagulation profile No results for input(s): INR, PROTIME in the last 168 hours.  CBC:  Recent Labs Lab 11/25/16 0210  WBC 7.1  NEUTROABS 4.5  HGB 7.0*  HCT 18.8*  MCV 92.2  PLT 113*   Cardiac Enzymes:  Recent Labs Lab 11/25/16 0340  CKTOTAL 1,131*   BNP (last 3 results) No results for input(s): PROBNP in the last 8760 hours. CBG: No results for input(s): GLUCAP in the last 168 hours. D-Dimer: No results for input(s): DDIMER in the last 72 hours. Hgb A1c: No results for input(s): HGBA1C in the last 72 hours. Lipid Profile: No results for input(s): CHOL, HDL, LDLCALC, TRIG, CHOLHDL, LDLDIRECT in the last 72 hours. Thyroid function studies:  Recent Labs  11/25/16 0210  TSH 2.066   Anemia work up: No results for input(s): VITAMINB12, FOLATE, FERRITIN, TIBC, IRON, RETICCTPCT in the last 72 hours. Sepsis Labs:  Recent Labs Lab 11/25/16 0210 11/25/16 0221  WBC 7.1  --   LATICACIDVEN  --  1.26   Microbiology Recent Results (from the past  240 hour(s))  MRSA PCR Screening     Status: None   Collection Time: 11/25/16  6:24 AM  Result Value Ref Range Status   MRSA by PCR NEGATIVE NEGATIVE Final    Comment:        The GeneXpert MRSA Assay (FDA approved for NASAL specimens only), is one component of a comprehensive MRSA colonization surveillance program. It is not intended to diagnose MRSA infection nor to guide or monitor treatment for MRSA infections.      Medications:   . liver oil-zinc oxide   Topical BID  . LORazepam  0-4 mg Intravenous  Q6H   Or  . LORazepam  0-4 mg Oral Q6H  . [START ON 11/27/2016] LORazepam  0-4 mg Intravenous Q12H   Or  . [START ON 11/27/2016] LORazepam  0-4 mg Oral Q12H  . magnesium oxide  400 mg Oral BID  . nicotine  21 mg Transdermal Daily  . [START ON 11/28/2016] pantoprazole  40 mg Intravenous Q12H  . potassium phosphate (monobasic)  500 mg Oral TID WC & HS  . thiamine  100 mg Oral Daily   Or  . thiamine  100 mg Intravenous Daily   Continuous Infusions: . sodium chloride 100 mL/hr at 11/25/16 1030  . pantoprozole (PROTONIX) infusion 8 mg/hr (11/25/16 1300)     LOS: 0 days   Charlynne Cousins  [facility] Pager [phone]  *Please refer to [email or web address], password [email or web address] to get updated schedule on who will round on this patient, as hospitalists switch teams weekly. If 7PM-7AM, please contact night-coverage at [email or web address], password [email or web address] for any overnight needs.  11/25/2016, 2:01 PM

## 2016-11-26 DIAGNOSIS — E871 Hypo-osmolality and hyponatremia: Secondary | ICD-10-CM | POA: Diagnosis not present

## 2016-11-26 DIAGNOSIS — N179 Acute kidney failure, unspecified: Secondary | ICD-10-CM | POA: Diagnosis not present

## 2016-11-26 DIAGNOSIS — D696 Thrombocytopenia, unspecified: Secondary | ICD-10-CM | POA: Diagnosis not present

## 2016-11-26 DIAGNOSIS — E878 Other disorders of electrolyte and fluid balance, not elsewhere classified: Secondary | ICD-10-CM

## 2016-11-26 DIAGNOSIS — E876 Hypokalemia: Secondary | ICD-10-CM | POA: Diagnosis not present

## 2016-11-26 DIAGNOSIS — M6282 Rhabdomyolysis: Secondary | ICD-10-CM

## 2016-11-26 DIAGNOSIS — D649 Anemia, unspecified: Secondary | ICD-10-CM | POA: Diagnosis not present

## 2016-11-26 DIAGNOSIS — F1010 Alcohol abuse, uncomplicated: Secondary | ICD-10-CM | POA: Diagnosis not present

## 2016-11-26 DIAGNOSIS — I959 Hypotension, unspecified: Secondary | ICD-10-CM | POA: Diagnosis not present

## 2016-11-26 LAB — HEMOGLOBIN A1C
HEMOGLOBIN A1C: 4.7 % — AB (ref 4.8–5.6)
Mean Plasma Glucose: 88.19 mg/dL

## 2016-11-26 LAB — RENAL FUNCTION PANEL
ALBUMIN: 3 g/dL — AB (ref 3.5–5.0)
ALBUMIN: 2.8 g/dL — AB (ref 3.5–5.0)
Anion gap: 12 (ref 5–15)
Anion gap: 8 (ref 5–15)
BUN: 32 mg/dL — AB (ref 6–20)
BUN: 30 mg/dL — AB (ref 6–20)
CHLORIDE: 109 mmol/L (ref 101–111)
CO2: 17 mmol/L — ABNORMAL LOW (ref 22–32)
CO2: 19 mmol/L — ABNORMAL LOW (ref 22–32)
CREATININE: 1.68 mg/dL — AB (ref 0.61–1.24)
CREATININE: 1.29 mg/dL — AB (ref 0.61–1.24)
Calcium: 6.9 mg/dL — ABNORMAL LOW (ref 8.9–10.3)
Calcium: 7.2 mg/dL — ABNORMAL LOW (ref 8.9–10.3)
Chloride: 104 mmol/L (ref 101–111)
GFR calc Af Amer: >60 mL/min (ref >60)
GFR, EST AFRICAN AMERICAN: 48 mL/min — AB (ref >60)
GFR, EST NON AFRICAN AMERICAN: 42 mL/min — AB (ref >60)
GFR, EST NON AFRICAN AMERICAN: 57 mL/min — AB (ref >60)
Glucose, Bld: 269 mg/dL — ABNORMAL HIGH (ref 65–99)
Glucose, Bld: 148 mg/dL — ABNORMAL HIGH (ref 65–99)
PHOSPHORUS: 1.9 mg/dL — AB (ref 2.5–4.6)
POTASSIUM: 3.5 mmol/L (ref 3.5–5.1)
Phosphorus: 1.2 mg/dL — ABNORMAL LOW (ref 2.5–4.6)
Potassium: 4.1 mmol/L (ref 3.5–5.1)
SODIUM: 133 mmol/L — AB (ref 135–145)
Sodium: 136 mmol/L (ref 135–145)

## 2016-11-26 LAB — GLUCOSE, CAPILLARY
GLUCOSE-CAPILLARY: 95 mg/dL (ref 65–99)
Glucose-Capillary: 136 mg/dL — ABNORMAL HIGH (ref 65–99)
Glucose-Capillary: 55 mg/dL — ABNORMAL LOW (ref 65–99)
Glucose-Capillary: 89 mg/dL (ref 65–99)
Glucose-Capillary: 107 mg/dL — ABNORMAL HIGH (ref 65–99)

## 2016-11-26 LAB — IRON AND TIBC
Iron: 44 ug/dL — ABNORMAL LOW (ref 45–182)
Saturation Ratios: 36 % (ref 17.9–39.5)
TIBC: 122 ug/dL — AB (ref 250–450)
UIBC: 78 ug/dL

## 2016-11-26 LAB — FOLATE: Folate: 23 ng/mL (ref >5.9)

## 2016-11-26 LAB — VITAMIN B12: VITAMIN B 12: 709 pg/mL (ref 180–914)

## 2016-11-26 LAB — HIV ANTIBODY (ROUTINE TESTING W REFLEX): HIV SCREEN 4TH GENERATION: NONREACTIVE

## 2016-11-26 LAB — FERRITIN: FERRITIN: 739 ng/mL — AB (ref 24–336)

## 2016-11-26 LAB — CK: CK TOTAL: 381 U/L (ref 49–397)

## 2016-11-26 MED ORDER — ENSURE ENLIVE PO LIQD
237.0000 mL | Freq: Two times a day (BID) | ORAL | Status: DC
  Administered 2016-11-26 – 2016-11-27 (×3): 237 mL via ORAL

## 2016-11-26 MED ORDER — K PHOS MONO-SOD PHOS DI & MONO 155-852-130 MG PO TABS
500.0000 mg | ORAL_TABLET | Freq: Three times a day (TID) | ORAL | Status: DC
  Administered 2016-11-26 – 2016-11-27 (×6): 500 mg via ORAL
  Filled 2016-11-26 (×8): qty 2

## 2016-11-26 MED ORDER — SODIUM PHOSPHATES 45 MMOLE/15ML IV SOLN
30.0000 mmol | Freq: Once | INTRAVENOUS | Status: AC
  Administered 2016-11-26: 30 mmol via INTRAVENOUS
  Filled 2016-11-26: qty 10

## 2016-11-26 MED ORDER — IPRATROPIUM-ALBUTEROL 0.5-2.5 (3) MG/3ML IN SOLN
3.0000 mL | Freq: Two times a day (BID) | RESPIRATORY_TRACT | Status: DC
  Administered 2016-11-27 – 2016-11-29 (×4): 3 mL via RESPIRATORY_TRACT
  Filled 2016-11-26 (×5): qty 3

## 2016-11-26 MED ORDER — GLUCOSE 40 % PO GEL
ORAL | Status: AC
  Administered 2016-11-26: 37.5 g
  Filled 2016-11-26: qty 1

## 2016-11-26 NOTE — Progress Notes (Addendum)
[facility] PROGRESS NOTE    Progress Note  Patrick [name]  RSW:[id number] DOB: 01-May-1950 DOA: 11/25/2016 PCP: [name], MD     Brief Narrative:   Patrick [name] is an 66 y.o. male past medical history of alcohol abuse depression was involuntarily committed by family members her inability to care for himself patient is a poor historian and he is encephalopathic has been sitting at home for 3 days and his face use any urine.  Assessment/Plan:   Symptomatic anemia: Hbg on January of last year was 12.  Negative FOBT's. Discontinue IV Protonix. Has been transfuse 1 unit of packed red blood cells. Hemoglobin stable at 8.3 He is tolerating her diet had several bowel movements which were brown FOBT is really negative.  Acute kidney injury: In the setting of ARB use. Previous creatinine in February 2017 was less than 1. Likely prerenal in etiology has improved with IV fluid hydration will continue normal saline for an additional 24 hours.  Mild rhabdomyolysis: Resolved with IV fluid hydration.  EtOH abuse: Continue thiamine and folate, monitor with CIWA protocol.  Electrolyte abnormalities: Hyponatremia, hypokalemia, hypomagnesemia, hypophosphatemia and hypokalemia: Most of electrolyte abnormalities have improved except phosphorus. Continue phosphorus repletion IV and oral, concern for refeeding syndrome.  Essential hypertension: Hold oral antihypertensive medication.  Transaminitis: With a 2:1 ratio AST 2 ALT likely due to alcohol.  Stage II pressure ulcer: Wound care.  Thrombocytopenia: Likely due to alcohol abuse.  Severe protein caloric malnutrition. Nutrition consult.  DVT prophylaxis: SCD's Family Communication:none Disposition Plan/Barrier to D/C: Transferred to MedSurg unit. Code Status:     Code Status Orders        Start     Ordered   11/25/16 0454  Full code  Continuous     11/25/16 0458    Code Status History    Date Active Date  Inactive Code Status Order ID Comments User Context   11/25/2016  1:40 AM 11/25/2016  4:58 AM Full Code [id number]  Antonietta Breach, PA-C ED        IV Access:    Peripheral IV   Procedures and diagnostic studies:   Dg Chest 2 View  Result Date: 11/25/2016 CLINICAL DATA:  Cough and wheezing.  Hypotensive. EXAM: CHEST  2 VIEW COMPARISON:  Chest radiograph April 26, 2011 FINDINGS: Increased lung volumes with apical bullous changes. Cardiac silhouette is normal in size. Mediastinal silhouette is nonsuspicious, mildly calcified aortic knob. Small to moderate hiatal hernia. No pleural effusion or focal consolidation. No pneumothorax. Soft tissue planes and included osseous structures are nonsuspicious. IMPRESSION: COPD, no acute cardiopulmonary process. Small to moderate hiatal hernia. Electronically Signed   By: Elon Alas M.D.   On: 11/25/2016 03:14     Medical Consultants:    None.  Anti-Infectives:   None  Subjective:    Nat Christen evaluate known you complaints. Difficult personality .  Objective:    Vitals:   11/26/16 0500 11/26/16 0600 11/26/16 0606 11/26/16 0609  BP: 104/66 (!) 84/55  (!) 103/58  Pulse: 97 87 91 95  Resp: 16 15 15 19   Temp:      TempSrc:      SpO2: (!) 87% 91% (!) 89% 98%  Weight:      Height:        Intake/Output Summary (Last 24 hours) at 11/26/16 0658 Last data filed at 11/26/16 0600  Gross per 24 hour  Intake  5565 ml  Output             3950 ml  Net             1615 ml   Filed Weights   11/25/16 0104  Weight: 65.8 kg (145 lb)    Exam: General exam: In no acute distress. Respiratory system: Good air movement clear to auscultation. Cardiovascular system: Regular rate and rhythm with positive S1-S2 no murmurs gallops Gastrointestinal system: Abdomen soft nontender nondistended. Extremities: No lower extremity edema. Skin: No rashes, lesions or ulcers Psychiatry: Judgement and insight appear normal. Mood &  affect appropriate.    Data Reviewed:    Labs: Basic Metabolic Panel:  Recent Labs Lab 11/25/16 0210 11/25/16 0340 11/25/16 1453 11/25/16 1833 11/26/16 0343  NA 125*  --  133*  --  136  K 3.1*  --  3.5  --  4.1  CL 89*  --  104  --  109  CO2 20*  --  17*  --  19*  GLUCOSE 123*  --  269*  --  148*  BUN 47*  --  32*  --  30*  CREATININE 2.74*  --  1.68*  --  1.29*  CALCIUM 7.7*  --  6.9*  --  7.2*  MG  --  0.5*  --  1.5*  --   PHOS  --   --  1.9*  1.9*  --  1.2*   GFR Estimated Creatinine Clearance: 54.5 mL/min (A) (by C-G formula based on SCr of 1.29 mg/dL (H)). Liver Function Tests:  Recent Labs Lab 11/25/16 0210 11/25/16 1453 11/26/16 0343  AST 71*  --   --   ALT 19  --   --   ALKPHOS 45  --   --   BILITOT 1.6*  --   --   PROT 6.7  --   --   ALBUMIN 3.4* 3.0* 2.8*   No results for input(s): LIPASE, AMYLASE in the last 168 hours.  Recent Labs Lab 11/25/16 0335  AMMONIA 19   Coagulation profile No results for input(s): INR, PROTIME in the last 168 hours.  CBC:  Recent Labs Lab 11/25/16 0210 11/25/16 1453  WBC 7.1 3.5*  NEUTROABS 4.5  --   HGB 7.0* 8.3*  HCT 18.8* 22.8*  MCV 92.2 92.3  PLT 113* 111*   Cardiac Enzymes:  Recent Labs Lab 11/25/16 0340  CKTOTAL 1,131*   BNP (last 3 results) No results for input(s): PROBNP in the last 8760 hours. CBG:  Recent Labs Lab 11/25/16 1644 11/25/16 2135  GLUCAP 304* 252*   D-Dimer: No results for input(s): DDIMER in the last 72 hours. Hgb A1c:  Recent Labs  11/25/16 1833  HGBA1C 4.7*   Lipid Profile: No results for input(s): CHOL, HDL, LDLCALC, TRIG, CHOLHDL, LDLDIRECT in the last 72 hours. Thyroid function studies:  Recent Labs  11/25/16 0210  TSH 2.066   Anemia work up:  Recent Labs  11/25/16 1833  VITAMINB12 709  FOLATE 23.0  FERRITIN 739*  TIBC 122*  IRON 44*  RETICCTPCT 0.9   Sepsis Labs:  Recent Labs Lab 11/25/16 0210 11/25/16 0221 11/25/16 1453  WBC 7.1   --  3.5*  LATICACIDVEN  --  1.26  --    Microbiology Recent Results (from the past 240 hour(s))  MRSA PCR Screening     Status: None   Collection Time: 11/25/16  6:24 AM  Result Value Ref Range Status   MRSA by PCR NEGATIVE  NEGATIVE Final    Comment:        The GeneXpert MRSA Assay (FDA approved for NASAL specimens only), is one component of a comprehensive MRSA colonization surveillance program. It is not intended to diagnose MRSA infection nor to guide or monitor treatment for MRSA infections.      Medications:   . chlordiazePOXIDE  25 mg Oral QID   Followed by  . chlordiazePOXIDE  25 mg Oral TID   Followed by  . [START ON 11/27/2016] chlordiazePOXIDE  25 mg Oral BH-qamhs   Followed by  . [START ON 11/28/2016] chlordiazePOXIDE  25 mg Oral Daily  . folic acid  1 mg Oral Daily  . insulin aspart  0-5 Units Subcutaneous QHS  . insulin aspart  0-9 Units Subcutaneous TID WC  . insulin aspart  3 Units Subcutaneous TID WC  . insulin detemir  5 Units Subcutaneous BID  . liver oil-zinc oxide   Topical BID  . metoprolol succinate  12.5 mg Oral Daily  . multivitamin with minerals  1 tablet Oral Daily  . nicotine  21 mg Transdermal Daily  . [START ON 11/28/2016] pantoprazole  40 mg Intravenous Q12H  . polyethylene glycol  17 g Oral BID  . potassium phosphate (monobasic)  500 mg Oral TID WC & HS  . thiamine  100 mg Intramuscular Once  . thiamine  100 mg Oral Daily   Continuous Infusions: . sodium chloride 75 mL/hr at 11/26/16 0608  . pantoprozole (PROTONIX) infusion 8 mg/hr (11/26/16 0400)     LOS: 1 day   Charlynne Cousins  [facility] Pager [phone]  *Please refer to [email or web address], password [email or web address] to get updated schedule on who will round on this patient, as hospitalists switch teams weekly. If 7PM-7AM, please contact night-coverage at [email or web address], password [email or web address] for any overnight needs.  11/26/2016, 6:58 AM

## 2016-11-26 NOTE — Progress Notes (Signed)
Recd pt from the ICU. Not change in initial AM assessment. VSS. 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 Cont with plan of care

## 2016-11-26 NOTE — Progress Notes (Signed)
PT Cancellation Note  Patient Details Name: Patrick [name] MRN: [id number] DOB: Dec 08, 1950   Cancelled Treatment:    Reason Eval/Treat Not Completed: Pain limiting ability to participate;Fatigue/lethargy limiting ability to participate. CNA states that patient's buttocks are very redd, patient reports being uncomfortable. Will check back in AM.    Marcelino Freestone PT [phone]  11/26/2016, 3:38 PM

## 2016-11-26 NOTE — Progress Notes (Signed)
Pt had uneventful day. Sitter at bedside. Cont with plan of care

## 2016-11-26 NOTE — Progress Notes (Signed)
Called sister to update on patient being moved to 4th floor and to inquire about patients cell phone location. She informed me that Claiborne Billings his ex wife and older son had it and he's keys. She lives in [place] and would be coming up to check on patient.  She would let Claiborne Billings know that patient has been moved.

## 2016-11-27 DIAGNOSIS — E876 Hypokalemia: Secondary | ICD-10-CM | POA: Diagnosis not present

## 2016-11-27 DIAGNOSIS — E871 Hypo-osmolality and hyponatremia: Secondary | ICD-10-CM | POA: Diagnosis not present

## 2016-11-27 DIAGNOSIS — M6282 Rhabdomyolysis: Secondary | ICD-10-CM | POA: Diagnosis not present

## 2016-11-27 DIAGNOSIS — I959 Hypotension, unspecified: Secondary | ICD-10-CM | POA: Diagnosis not present

## 2016-11-27 DIAGNOSIS — D649 Anemia, unspecified: Secondary | ICD-10-CM | POA: Diagnosis not present

## 2016-11-27 DIAGNOSIS — N179 Acute kidney failure, unspecified: Secondary | ICD-10-CM | POA: Diagnosis not present

## 2016-11-27 DIAGNOSIS — F17210 Nicotine dependence, cigarettes, uncomplicated: Secondary | ICD-10-CM

## 2016-11-27 DIAGNOSIS — F10231 Alcohol dependence with withdrawal delirium: Secondary | ICD-10-CM

## 2016-11-27 DIAGNOSIS — F1010 Alcohol abuse, uncomplicated: Secondary | ICD-10-CM | POA: Diagnosis not present

## 2016-11-27 DIAGNOSIS — Z813 Family history of other psychoactive substance abuse and dependence: Secondary | ICD-10-CM

## 2016-11-27 DIAGNOSIS — L89152 Pressure ulcer of sacral region, stage 2: Secondary | ICD-10-CM | POA: Diagnosis not present

## 2016-11-27 LAB — RENAL FUNCTION PANEL
Albumin: 2.7 g/dL — ABNORMAL LOW (ref 3.5–5.0)
Anion gap: 9 (ref 5–15)
BUN: 16 mg/dL (ref 6–20)
CALCIUM: 7.1 mg/dL — AB (ref 8.9–10.3)
CHLORIDE: 111 mmol/L (ref 101–111)
CO2: 21 mmol/L — AB (ref 22–32)
CREATININE: 1 mg/dL (ref 0.61–1.24)
GFR calc Af Amer: >60 mL/min (ref >60)
GFR calc non Af Amer: >60 mL/min (ref >60)
Glucose, Bld: 75 mg/dL (ref 65–99)
Phosphorus: 4.1 mg/dL (ref 2.5–4.6)
Potassium: 4.4 mmol/L (ref 3.5–5.1)
SODIUM: 141 mmol/L (ref 135–145)

## 2016-11-27 LAB — CBC
HCT: 23.2 % — ABNORMAL LOW (ref 39.0–52.0)
Hemoglobin: 8.3 g/dL — ABNORMAL LOW (ref 13.0–17.0)
MCH: 34 pg (ref 26.0–34.0)
MCHC: 35.8 g/dL (ref 30.0–36.0)
MCV: 95.1 fL (ref 78.0–100.0)
Platelets: 182 10*3/uL (ref 150–400)
RBC: 2.44 MIL/uL — ABNORMAL LOW (ref 4.22–5.81)
RDW: 16.6 % — ABNORMAL HIGH (ref 11.5–15.5)
WBC: 6 10*3/uL (ref 4.0–10.5)

## 2016-11-27 LAB — GLUCOSE, CAPILLARY
Glucose-Capillary: 114 mg/dL — ABNORMAL HIGH (ref 65–99)
Glucose-Capillary: 57 mg/dL — ABNORMAL LOW (ref 65–99)
Glucose-Capillary: 74 mg/dL (ref 65–99)
Glucose-Capillary: 96 mg/dL (ref 65–99)
Glucose-Capillary: 112 mg/dL — ABNORMAL HIGH (ref 65–99)
Glucose-Capillary: 140 mg/dL — ABNORMAL HIGH (ref 65–99)

## 2016-11-27 MED ORDER — FERROUS SULFATE 325 (65 FE) MG PO TABS
325.0000 mg | ORAL_TABLET | Freq: Three times a day (TID) | ORAL | Status: DC
  Administered 2016-11-27 – 2016-11-29 (×5): 325 mg via ORAL
  Filled 2016-11-27 (×5): qty 1

## 2016-11-27 NOTE — Progress Notes (Signed)
[facility] PROGRESS NOTE    Progress Note  Patrick [name]  IRJ:[id number] DOB: 09/23/1950 DOA: 11/25/2016 PCP: [name], MD     Brief Narrative:   Patrick [name] is an 66 y.o. male past medical history of alcohol abuse depression was involuntarily committed by family members her inability to care for himself patient is a poor historian and he is encephalopathic has been sitting at home for 3 days and his face use any urine.  Assessment/Plan:   Symptomatic anemia: Hbg on January of last year was 12.  Negative FOBT's. Has been transfuse 1 unit of packed red blood cells. Hemoglobin stable at 8.3 He is tolerating her diet had several bowel movements which were brown FOBT is really negative. Ferrous sulfate daily. Consults psychiatry for capacity.  Acute kidney injury: In the setting of ARB use. Baseline creatinine is less than 1. Likely prerenal in etiology, now resolved.  Mild rhabdomyolysis: Resolved with IV fluid hydration.  EtOH abuse: Continue thiamine and folate, monitor with CIWA protocol.  Electrolyte abnormalities: Hyponatremia, hypokalemia, hypomagnesemia, hypophosphatemia:  electrolyte abnormalities have resolved.  Essential hypertension:  continue to hold antiepileptic medication.  transaminitis: With a 2:1 ratio AST 2 ALT likely due to alcohol.  Stage II pressure ulcer: Wound care.  Thrombocytopenia: Likely due to alcohol abuse.  Severe protein caloric malnutrition. Malnutrition Type:      Malnutrition Characteristics:      Nutrition Interventions:     DVT prophylaxis: SCD's Family Communication:none Disposition Plan/Barrier to D/C:[place] to 3 days. Code Status:     Code Status Orders        Start     Ordered   11/25/16 0454  Full code  Continuous     11/25/16 0458    Code Status History    Date Active Date Inactive Code Status Order ID Comments User Context   11/25/2016  1:40 AM 11/25/2016  4:58 AM Full Code  [id number]  Antonietta Breach, PA-C ED        IV Access:    Peripheral IV   Procedures and diagnostic studies:   No results found.   Medical Consultants:    None.  Anti-Infectives:   None  Subjective:    Patrick [name] evaluate known you complaints. Difficult personality .  Objective:    Vitals:   11/26/16 2047 11/26/16 2311 11/27/16 0613 11/27/16 0844  BP: 116/69  111/80   Pulse: (!) 104  (!) 110   Resp: (!) 22  20   Temp: 98 F (36.7 C)  98.1 F (36.7 C)   TempSrc: Oral  Oral   SpO2: 100% 99% 100% (!) 9%  Weight:      Height:        Intake/Output Summary (Last 24 hours) at 11/27/16 1036 Last data filed at 11/27/16 0700  Gross per 24 hour  Intake             1795 ml  Output             1776 ml  Net               19 ml   Filed Weights   11/25/16 0104  Weight: 65.8 kg (145 lb)    Exam: General exam: In no acute distress. Respiratory system: Good air movement clear to auscultation. Cardiovascular system: Regular rate and rhythm with positive S1-S2 no murmurs gallops Gastrointestinal system: Abdomen soft nontender nondistended. Extremities: No lower extremity edema. Skin: No rashes, lesions or ulcers Psychiatry: Judgement  and insight appear normal. Mood & affect appropriate.    Data Reviewed:    Labs: Basic Metabolic Panel:  Recent Labs Lab 11/25/16 0210 11/25/16 0340 11/25/16 1453 11/25/16 1833 11/26/16 0343 11/27/16 0435  NA 125*  --  133*  --  136 141  K 3.1*  --  3.5  --  4.1 4.4  CL 89*  --  104  --  109 111  CO2 20*  --  17*  --  19* 21*  GLUCOSE 123*  --  269*  --  148* 75  BUN 47*  --  32*  --  30* 16  CREATININE 2.74*  --  1.68*  --  1.29* 1.00  CALCIUM 7.7*  --  6.9*  --  7.2* 7.1*  MG  --  0.5*  --  1.5*  --   --   PHOS  --   --  1.9*  1.9*  --  1.2* 4.1   GFR Estimated Creatinine Clearance: 70.4 mL/min (by C-G formula based on SCr of 1 mg/dL). Liver Function Tests:  Recent Labs Lab 11/25/16 0210 11/25/16 1453  11/26/16 0343 11/27/16 0435  AST 71*  --   --   --   ALT 19  --   --   --   ALKPHOS 45  --   --   --   BILITOT 1.6*  --   --   --   PROT 6.7  --   --   --   ALBUMIN 3.4* 3.0* 2.8* 2.7*   No results for input(s): LIPASE, AMYLASE in the last 168 hours.  Recent Labs Lab 11/25/16 0335  AMMONIA 19   Coagulation profile No results for input(s): INR, PROTIME in the last 168 hours.  CBC:  Recent Labs Lab 11/25/16 0210 11/25/16 1453  WBC 7.1 3.5*  NEUTROABS 4.5  --   HGB 7.0* 8.3*  HCT 18.8* 22.8*  MCV 92.2 92.3  PLT 113* 111*   Cardiac Enzymes:  Recent Labs Lab 11/25/16 0340 11/26/16 1324  CKTOTAL 1,131* 381   BNP (last 3 results) No results for input(s): PROBNP in the last 8760 hours. CBG:  Recent Labs Lab 11/26/16 1413 11/26/16 1721 11/26/16 2110 11/27/16 0803 11/27/16 0901  GLUCAP 89 107* 95 57* 74   D-Dimer: No results for input(s): DDIMER in the last 72 hours. Hgb A1c:  Recent Labs  11/25/16 1833  HGBA1C 4.7*   Lipid Profile: No results for input(s): CHOL, HDL, LDLCALC, TRIG, CHOLHDL, LDLDIRECT in the last 72 hours. Thyroid function studies:  Recent Labs  11/25/16 0210  TSH 2.066   Anemia work up:  Recent Labs  11/25/16 1833  VITAMINB12 709  FOLATE 23.0  FERRITIN 739*  TIBC 122*  IRON 44*  RETICCTPCT 0.9   Sepsis Labs:  Recent Labs Lab 11/25/16 0210 11/25/16 0221 11/25/16 1453  WBC 7.1  --  3.5*  LATICACIDVEN  --  1.26  --    Microbiology Recent Results (from the past 240 hour(s))  MRSA PCR Screening     Status: None   Collection Time: 11/25/16  6:24 AM  Result Value Ref Range Status   MRSA by PCR NEGATIVE NEGATIVE Final    Comment:        The GeneXpert MRSA Assay (FDA approved for NASAL specimens only), is one component of a comprehensive MRSA colonization surveillance program. It is not intended to diagnose MRSA infection nor to guide or monitor treatment for MRSA infections.      Medications:   .  chlordiazePOXIDE  25 mg Oral TID   Followed by  . chlordiazePOXIDE  25 mg Oral BH-qamhs   Followed by  . [START ON 11/28/2016] chlordiazePOXIDE  25 mg Oral Daily  . feeding supplement (ENSURE ENLIVE)  237 mL Oral BID BM  . folic acid  1 mg Oral Daily  . insulin aspart  0-5 Units Subcutaneous QHS  . insulin aspart  0-9 Units Subcutaneous TID WC  . insulin aspart  3 Units Subcutaneous TID WC  . insulin detemir  5 Units Subcutaneous BID  . ipratropium-albuterol  3 mL Nebulization BID  . liver oil-zinc oxide   Topical BID  . metoprolol succinate  12.5 mg Oral Daily  . multivitamin with minerals  1 tablet Oral Daily  . nicotine  21 mg Transdermal Daily  . phosphorus  500 mg Oral TID  . polyethylene glycol  17 g Oral BID  . thiamine  100 mg Intramuscular Once  . thiamine  100 mg Oral Daily   Continuous Infusions:    LOS: 2 days   Charlynne Cousins  [facility] Pager [phone]  *Please refer to [email or web address], password [email or web address] to get updated schedule on who will round on this patient, as hospitalists switch teams weekly. If 7PM-7AM, please contact night-coverage at [email or web address], password [email or web address] for any overnight needs.  11/27/2016, 10:36 AM

## 2016-11-27 NOTE — Progress Notes (Addendum)
Initial Nutrition Assessment  DOCUMENTATION CODES:   Non-severe (moderate) malnutrition in context of social or environmental circumstances  INTERVENTION:   Ensure Enlive po BID, each supplement provides 350 kcal and 20 grams of protein  MVI Daily   Pt is at high risk for refeeding. Monitor and supplement electrolytes as need per MD descretion.   NUTRITION DIAGNOSIS:   Malnutrition (Moderate) related to social / environmental circumstances (alcohol abuse) as evidenced by <50% of EER for >1 month, moderate depletions of muscle mass, moderate depletion of body fat.  GOAL:   Patient will meet greater than or equal to 90% of their needs  MONITOR:   PO intake, Supplement acceptance, Labs, Weight trends  REASON FOR ASSESSMENT:   Consult Assessment of nutrition requirement/status  ASSESSMENT:   Pt with PMH of alcohol abuse, depression, and GERD. Presents this admission after being involuntary committed by family for inability to care for herself. Noted to have symptomatic anemia with suspected GI bleed.    Pt lives at home by himself. Reports having decreased appetite for a little over a month due to an unknown etiology. Typically consumes 2 meals per day, that consist of packaged options like "[employer]ouffer[employer]." Per MD H&P pt consumes gin and tonic daily. Given this pt's alcohol usage and abnormal magnesium/phosphorus readings, suspect pt has not consumed > 50% of his estimated energy requirement for > 1 month. This is significant. This pt is at high risk for refeeding syndrome.  Pt's UBW stays around 145 lb. Records indicate pt's wt has been stable within a years time staying around 145-149 lb.   Nutrition-Focused physical exam completed. Findings are moderate fat depletion, moderate muscle depletion, and no edema. Pt showed to have yellow tint, with dry skin. With pt's chronic alcohol abuse and lack of appetite this pt is at high risk for severe malnutrition.   Medications reviewed and  include: SSI, folic acid, MVI, Phos, thiamine Labs reviewed: CO2 21 (L) Calcium 7.1 (L) Phos 1.2 (L) Mag 1.5 (L) Albumin 2.7 (L) CBG 75-148  Diet Order:  Diet regular Room service appropriate? Yes; Fluid consistency: Thin  Skin:   (stg I buttocks stg II sacrum)  Last BM:  11/27/16  Height:   Ht Readings from Last 1 Encounters:  11/25/16 5\' 8"  (1.727 m)    Weight:   Wt Readings from Last 1 Encounters:  11/25/16 145 lb (65.8 kg)    Ideal Body Weight:  70 kg  BMI:  Body mass index is 22.05 kg/m.  Estimated Nutritional Needs:   Kcal:  1700-1900 (26-29 kcal/kg)  Protein:  95-105 grams (1.4-1.6 g/kg)  Fluid:  >1.7 L/day  EDUCATION NEEDS:   No education needs identified at this time  [place], LDN Clinical Nutrition Pager # - [phone]

## 2016-11-27 NOTE — NC FL2 (Signed)
[place] LEVEL OF CARE SCREENING TOOL     IDENTIFICATION  Patient Name: Patrick [name] Birthdate: 09-22-1950 Sex: male Admission Date (Current Location): 11/25/2016  [facility] and [place] Number:  Herbalist and Address:  [facility],  [place], [place]      Provider Number: [id number]  Attending Physician Name and Address:  Charlynne Cousins, MD  Relative Name and Phone Number:       Current Level of Care: Hospital Recommended Level of Care: [place] Prior Approval Number:    Date Approved/Denied:   PASRR Number: [id number]  Discharge Plan: SNF    Current Diagnoses: Patient Active Problem List   Diagnosis Date Noted  . Symptomatic anemia 11/25/2016  . Pressure ulcer of sacral region, stage 2 11/25/2016  . Alcohol abuse 11/25/2016  . Hypotension 11/25/2016  . Hypomagnesemia 11/25/2016  . Hyponatremia 11/25/2016  . Non-traumatic rhabdomyolysis 11/25/2016  . Erosive esophagitis 05/14/2015  . Hypokalemia 04/30/2015  . Thrombocytopenia ([place]) 04/16/2015  . Alcohol dependence ([place]) 03/17/2015  . Change in bowel habits 02/23/2015  . Rectal bleeding 02/23/2015  . Constipation 02/23/2015  . Transaminasemia 03/27/2013  . Essential tremor 03/27/2013  . Hypertension 07/19/2012  . TOBACCO USE 03/01/2010  . DIVERTICULOSIS, COLON 05/14/2007  . DEPRESSION 11/05/2006  . COLONIC POLYPS, HX OF 11/05/2006    Orientation RESPIRATION BLADDER Height & Weight     Self, Time, Situation, Place  O2 Incontinent Weight: 145 lb (65.8 kg) Height:  5\' 8"  (172.7 cm)  BEHAVIORAL SYMPTOMS/MOOD NEUROLOGICAL BOWEL NUTRITION STATUS      Incontinent Diet (regular)  AMBULATORY STATUS COMMUNICATION OF NEEDS Skin   Extensive Assist Verbally Other (Comment) ( PressureInjury08/18/18StageI-Intactskinwithnon-blanchablerednessofalocalizedareausuallyoverabonyprominence. Location: Buttocks Location Orientation:  Right;Left;Upper;Lower )                       Personal Care Assistance Level of Assistance  Bathing, Feeding, Dressing Bathing Assistance: Maximum assistance Feeding assistance: Independent Dressing Assistance: Limited assistance     Functional Limitations Info             [place]  PT (By licensed PT), OT (By licensed OT)     PT Frequency: 5x/week OT Frequency: 5x/week            Contractures Contractures Info: Not present    Additional Factors Info  Code Status, Allergies Code Status Info: Full Code Allergies Info: NKA           Current Medications (11/27/2016):  This is the current hospital active medication list Current Facility-Administered Medications  Medication Dose Route Frequency Provider Last Rate Last Dose  . acetaminophen (TYLENOL) tablet 650 mg  650 mg Oral Q6H PRN Fuller Plan A, MD       Or  . acetaminophen (TYLENOL) suppository 650 mg  650 mg Rectal Q6H PRN Fuller Plan A, MD      . chlordiazePOXIDE (LIBRIUM) capsule 25 mg  25 mg Oral Q6H PRN Charlynne Cousins, MD      . chlordiazePOXIDE (LIBRIUM) capsule 25 mg  25 mg Oral TID Charlynne Cousins, MD   25 mg at 11/27/16 1014   Followed by  . chlordiazePOXIDE (LIBRIUM) capsule 25 mg  25 mg Oral BH-qamhs Charlynne Cousins, MD       Followed by  . [START ON 11/28/2016] chlordiazePOXIDE (LIBRIUM) capsule 25 mg  25 mg Oral Daily Charlynne Cousins, MD      . feeding supplement (  ENSURE ENLIVE) (ENSURE ENLIVE) liquid 237 mL  237 mL Oral BID BM Charlynne Cousins, MD   237 mL at 11/27/16 1500  . ferrous sulfate tablet 325 mg  325 mg Oral TID WC Charlynne Cousins, MD   325 mg at 11/27/16 1411  . folic acid (FOLVITE) tablet 1 mg  1 mg Oral Daily Charlynne Cousins, MD   1 mg at 11/27/16 1013  . HYDROcodone-acetaminophen (NORCO/VICODIN) 5-325 MG per tablet 1-2 tablet  1-2 tablet Oral Q6H PRN Opyd, Ilene Qua, MD   2 tablet at 11/26/16 1740  . hydrOXYzine  (ATARAX/VISTARIL) tablet 25 mg  25 mg Oral Q6H PRN Charlynne Cousins, MD   25 mg at 11/27/16 0349  . ipratropium-albuterol (DUONEB) 0.5-2.5 (3) MG/3ML nebulizer solution 3 mL  3 mL Nebulization Q4H PRN Fuller Plan A, MD   3 mL at 11/26/16 2311  . ipratropium-albuterol (DUONEB) 0.5-2.5 (3) MG/3ML nebulizer solution 3 mL  3 mL Nebulization BID Charlynne Cousins, MD   3 mL at 11/27/16 0844  . liver oil-zinc oxide (DESITIN) 40 % ointment   Topical BID Charlynne Cousins, MD      . loperamide (IMODIUM) capsule 2-4 mg  2-4 mg Oral PRN Charlynne Cousins, MD   4 mg at 11/27/16 0349  . metoprolol succinate (TOPROL-XL) 24 hr tablet 12.5 mg  12.5 mg Oral Daily Charlynne Cousins, MD   12.5 mg at 11/27/16 1013  . multivitamin with minerals tablet 1 tablet  1 tablet Oral Daily Charlynne Cousins, MD   1 tablet at 11/27/16 1013  . nicotine (NICODERM CQ - dosed in mg/24 hours) patch 21 mg  21 mg Transdermal Daily Antonietta Breach, PA-C   21 mg at 11/27/16 1017  . ondansetron (ZOFRAN) tablet 4 mg  4 mg Oral Q6H PRN Fuller Plan A, MD   4 mg at 11/25/16 2203   Or  . ondansetron (ZOFRAN) injection 4 mg  4 mg Intravenous Q6H PRN Smith, Rondell A, MD      . phosphorus (K PHOS NEUTRAL) tablet 500 mg  500 mg Oral TID Charlynne Cousins, MD   500 mg at 11/27/16 1012  . polyethylene glycol (MIRALAX / GLYCOLAX) packet 17 g  17 g Oral BID Charlynne Cousins, MD   17 g at 11/26/16 2235  . thiamine (B-1) injection 100 mg  100 mg Intramuscular Once Aileen Fass, Tammi Klippel, MD      . thiamine (VITAMIN B-1) tablet 100 mg  100 mg Oral Daily Charlynne Cousins, MD   100 mg at 11/27/16 1013     Discharge Medications: Please see discharge summary for a list of discharge medications.  Relevant Imaging Results:  Relevant Lab Results:   Additional Information SSN [id number]  Burnis Medin, LCSW

## 2016-11-27 NOTE — Consult Note (Signed)
[facility] Face-to-Face Psychiatry Consult   Reason for Consult:  Capacity Referring Physician:  Dr. Venetia Constable Patient Identification: Patrick [name] MRN:  [id number] Principal Diagnosis: Symptomatic anemia Diagnosis:   Patient Active Problem List   Diagnosis Date Noted  . Symptomatic anemia [D64.9] 11/25/2016  . Pressure ulcer of sacral region, stage 2 [L89.152] 11/25/2016  . Alcohol abuse [F10.10] 11/25/2016  . Hypotension [I95.9] 11/25/2016  . Hypomagnesemia [E83.42] 11/25/2016  . Hyponatremia [E87.1] 11/25/2016  . Non-traumatic rhabdomyolysis [M62.82] 11/25/2016  . Erosive esophagitis [K22.10] 05/14/2015  . Hypokalemia [E87.6] 04/30/2015  . Thrombocytopenia ([place]) [D69.6] 04/16/2015  . Alcohol dependence ([place]) [F10.20] 03/17/2015  . Change in bowel habits [R19.4] 02/23/2015  . Rectal bleeding [K62.5] 02/23/2015  . Constipation [K59.00] 02/23/2015  . Transaminasemia [R74.0] 03/27/2013  . Essential tremor [G25.0] 03/27/2013  . Hypertension [I10] 07/19/2012  . TOBACCO USE [F17.200] 03/01/2010  . DIVERTICULOSIS, COLON [K57.30] 05/14/2007  . DEPRESSION [F32.9] 11/05/2006  . COLONIC POLYPS, HX OF [Z86.010] 11/05/2006    Total Time spent with patient: 1 hour  Subjective:   Patrick [name] is a 66 y.o. male patient admitted with Altered mental status.  HPI:  Patrick [name] is a 66 y.o. male, seen along with CSW, chart reviewed and case discussed with the patient sister who is at bedside came from [place] for this face-to-face psychiatric consultation and evaluation. Patient appeared sleeping in his bed on arrival, easily aroused with verbal stimulation. Patient is able to answer some of the questions but slipping back on sleep in between questions. Based on my evaluation patient was not able to answer questions related to his current medical problems, required treatment needs. Patient seems like having the orientation and memory but does not have concentration or focus. Patient also not able to  answer questions related probe interpretation. Patient admitted to the recall is 3 out of 3 and delayed decrease 1 out of 3 patient did 2 out of 5 and 7 series and not able to do world spelling backwards. 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 Patient has a 2 children who are grown has both suffering with a drug of abuse and been placed and rehabilitation. Patient's sister stated that he was found confused and sometimes hallucinating.   Past Psychiatric History: Alcohol dependence,No history of detox or rehabilitation treatments.   Risk to Self: Is patient at risk for suicide?: no Risk to Others:   Prior Inpatient Therapy:   Prior Outpatient Therapy:    Past Medical History:  Past Medical History:  Diagnosis Date  . Colon polyps   . Depression   . Diverticulitis   . Diverticulosis of colon   . GERD (gastroesophageal reflux disease)   . Kidney stone     Past Surgical History:  Procedure Laterality Date  . COLONOSCOPY    . UPPER GASTROINTESTINAL ENDOSCOPY     Family History:  Family History  Problem Relation Age of Onset  . Heart disease Father 97       cabg   Family Psychiatric  History: Patient has a 2 children who were suffering with a drug addiction and been in rehabilitation treatments at substance abuse programs. Patient has no history of mental illness.   Social History:  History  Alcohol Use  . 8.4 oz/week  . 14 Glasses of wine per week     History  Drug Use No    Social History   Social History  . Marital status: Divorced    Spouse name: N/A  . Number of children: N/A  . Years of education: N/A   Social History Main Topics  . Smoking status: Current Every Day Smoker    Packs/day: 1.00    Types: Cigarettes  . Smokeless tobacco: Never Used  . Alcohol use 8.4 oz/week    14 Glasses of wine per week  . Drug use: No  . Sexual  activity: Not Asked   Other Topics Concern  . None   Social History Narrative  . None   Additional Social History:    Allergies:  No Known Allergies  Labs:  Results for orders placed or performed during the hospital encounter of 11/25/16 (from the past 48 hour(s))  Occult blood card to lab, stool RN will collect     Status: None   Collection Time: 11/25/16  1:00 PM  Result Value Ref Range   Fecal Occult Bld NEGATIVE NEGATIVE  Phosphorus     Status: Abnormal   Collection Time: 11/25/16  2:53 PM  Result Value Ref Range   Phosphorus 1.9 (L) 2.5 - 4.6 mg/dL  Renal function panel     Status: Abnormal   Collection Time: 11/25/16  2:53 PM  Result Value Ref Range   Sodium 133 (L) 135 - 145 mmol/L    Comment: DELTA CHECK NOTED   Potassium 3.5 3.5 - 5.1 mmol/L   Chloride 104 101 - 111 mmol/L   CO2 17 (L) 22 - 32 mmol/L   Glucose, Bld 269 (H) 65 - 99 mg/dL   BUN 32 (H) 6 - 20 mg/dL   Creatinine, Ser 1.68 (H) 0.61 - 1.24 mg/dL    Comment: DELTA CHECK NOTED   Calcium 6.9 (L) 8.9 - 10.3 mg/dL   Phosphorus 1.9 (L) 2.5 - 4.6 mg/dL   Albumin 3.0 (L) 3.5 - 5.0 g/dL   GFR calc non Af Amer 42 (L) >60 mL/min   GFR calc Af Amer 48 (L) >60 mL/min    Comment: (NOTE) The eGFR has been calculated using the CKD EPI equation. This calculation has not been validated in all clinical situations. eGFR's persistently <60 mL/min signify possible Chronic Kidney Disease.    Anion gap 12 5 - 15  CBC     Status: Abnormal   Collection Time: 11/25/16  2:53 PM  Result Value Ref Range   WBC 3.5 (L) 4.0 - 10.5 K/uL   RBC 2.47 (L) 4.22 - 5.81 MIL/uL   Hemoglobin 8.3 (L) 13.0 - 17.0 g/dL   HCT 22.8 (L) 39.0 - 52.0 %   MCV 92.3 78.0 - 100.0 fL   MCH 33.6 26.0 - 34.0 pg   MCHC 36.4 (H) 30.0 - 36.0 g/dL   RDW 14.7 11.5 - 15.5 %   Platelets 111 (L) 150 - 400 K/uL    Comment: RESULT REPEATED AND VERIFIED SPECIMEN CHECKED FOR CLOTS CONSISTENT WITH PREVIOUS RESULT   Glucose, capillary     Status: Abnormal    Collection Time: 11/25/16  4:44 PM  Result Value Ref Range   Glucose-Capillary 304 (H) 65 - 99 mg/dL  HIV antibody (Routine Testing)     Status: None   Collection Time: 11/25/16  6:33 PM  Result Value Ref Range   HIV Screen 4th Generation wRfx Non Reactive Non Reactive    Comment: (NOTE) Performed At: [facility] [address] [place], [place] [id number] Lindon Romp  MD PR:[id number]   Magnesium     Status: Abnormal   Collection Time: 11/25/16  6:33 PM  Result Value Ref Range   Magnesium 1.5 (L) 1.7 - 2.4 mg/dL  Vitamin B12     Status: None   Collection Time: 11/25/16  6:33 PM  Result Value Ref Range   Vitamin B-12 709 180 - 914 pg/mL    Comment: (NOTE) This assay is not validated for testing neonatal or myeloproliferative syndrome specimens for Vitamin B12 levels. Performed at [facility], [place] [address] [place], [place] [address]   Folate     Status: None   Collection Time: 11/25/16  6:33 PM  Result Value Ref Range   Folate 23.0 >5.9 ng/mL    Comment: Performed at [facility], [place] [address], [place], [place] [address]  Iron and TIBC     Status: Abnormal   Collection Time: 11/25/16  6:33 PM  Result Value Ref Range   Iron 44 (L) 45 - 182 ug/dL   TIBC 122 (L) 250 - 450 ug/dL   Saturation Ratios 36 17.9 - 39.5 %   UIBC 78 ug/dL    Comment: Performed at [facility] [address], [place], [place] [address]  Ferritin     Status: Abnormal   Collection Time: 11/25/16  6:33 PM  Result Value Ref Range   Ferritin 739 (H) 24 - 336 ng/mL    Comment: Performed at [facility], [place] [address], [place], [place] [address]  Reticulocytes     Status: Abnormal   Collection Time: 11/25/16  6:33 PM  Result Value Ref Range   Retic Ct Pct 0.9 0.4 - 3.1 %   RBC. 2.28 (L) 4.22 - 5.81 MIL/uL   Retic Count, Absolute 20.5 19.0 - 186.0 K/uL  Hemoglobin A1c     Status: Abnormal   Collection Time: 11/25/16  6:33 PM  Result Value Ref Range   Hgb  A1c MFr Bld 4.7 (L) 4.8 - 5.6 %    Comment: (NOTE) Pre diabetes:          5.7%-6.4% Diabetes:              >6.4% Glycemic control for   <7.0% adults with diabetes    Mean Plasma Glucose 88.19 mg/dL    Comment: Performed at [facility] [address], [place], [place] [address]  Glucose, capillary     Status: Abnormal   Collection Time: 11/25/16  9:35 PM  Result Value Ref Range   Glucose-Capillary 252 (H) 65 - 99 mg/dL   Comment 1 Notify RN    Comment 2 Document in Chart   Renal function panel     Status: Abnormal   Collection Time: 11/26/16  3:43 AM  Result Value Ref Range   Sodium 136 135 - 145 mmol/L   Potassium 4.1 3.5 - 5.1 mmol/L   Chloride 109 101 - 111 mmol/L   CO2 19 (L) 22 - 32 mmol/L   Glucose, Bld 148 (H) 65 - 99 mg/dL   BUN 30 (H) 6 - 20 mg/dL   Creatinine, Ser 1.29 (H) 0.61 - 1.24 mg/dL   Calcium 7.2 (L) 8.9 - 10.3 mg/dL   Phosphorus 1.2 (L) 2.5 - 4.6 mg/dL   Albumin 2.8 (L) 3.5 - 5.0 g/dL   GFR calc non Af Amer 57 (L) >60 mL/min   GFR calc Af Amer >60 >60 mL/min    Comment: (NOTE) The eGFR has been  calculated using the CKD EPI equation. This calculation has not been validated in all clinical situations. eGFR's persistently <60 mL/min signify possible Chronic Kidney Disease.    Anion gap 8 5 - 15  Glucose, capillary     Status: Abnormal   Collection Time: 11/26/16  7:48 AM  Result Value Ref Range   Glucose-Capillary 136 (H) 65 - 99 mg/dL  CK     Status: None   Collection Time: 11/26/16  1:24 PM  Result Value Ref Range   Total CK 381 49 - 397 U/L  Glucose, capillary     Status: Abnormal   Collection Time: 11/26/16  1:29 PM  Result Value Ref Range   Glucose-Capillary 55 (L) 65 - 99 mg/dL  Glucose, capillary     Status: None   Collection Time: 11/26/16  2:13 PM  Result Value Ref Range   Glucose-Capillary 89 65 - 99 mg/dL  Glucose, capillary     Status: Abnormal   Collection Time: 11/26/16  5:21 PM  Result Value Ref Range   Glucose-Capillary  107 (H) 65 - 99 mg/dL  Glucose, capillary     Status: None   Collection Time: 11/26/16  9:10 PM  Result Value Ref Range   Glucose-Capillary 95 65 - 99 mg/dL  Renal function panel     Status: Abnormal   Collection Time: 11/27/16  4:35 AM  Result Value Ref Range   Sodium 141 135 - 145 mmol/L   Potassium 4.4 3.5 - 5.1 mmol/L   Chloride 111 101 - 111 mmol/L   CO2 21 (L) 22 - 32 mmol/L   Glucose, Bld 75 65 - 99 mg/dL   BUN 16 6 - 20 mg/dL   Creatinine, Ser 1.00 0.61 - 1.24 mg/dL   Calcium 7.1 (L) 8.9 - 10.3 mg/dL   Phosphorus 4.1 2.5 - 4.6 mg/dL   Albumin 2.7 (L) 3.5 - 5.0 g/dL   GFR calc non Af Amer >60 >60 mL/min   GFR calc Af Amer >60 >60 mL/min    Comment: (NOTE) The eGFR has been calculated using the CKD EPI equation. This calculation has not been validated in all clinical situations. eGFR's persistently <60 mL/min signify possible Chronic Kidney Disease.    Anion gap 9 5 - 15  Glucose, capillary     Status: Abnormal   Collection Time: 11/27/16  8:03 AM  Result Value Ref Range   Glucose-Capillary 57 (L) 65 - 99 mg/dL  Glucose, capillary     Status: None   Collection Time: 11/27/16  9:01 AM  Result Value Ref Range   Glucose-Capillary 74 65 - 99 mg/dL  CBC     Status: Abnormal   Collection Time: 11/27/16 10:15 AM  Result Value Ref Range   WBC 6.0 4.0 - 10.5 K/uL   RBC 2.44 (L) 4.22 - 5.81 MIL/uL   Hemoglobin 8.3 (L) 13.0 - 17.0 g/dL   HCT 23.2 (L) 39.0 - 52.0 %   MCV 95.1 78.0 - 100.0 fL   MCH 34.0 26.0 - 34.0 pg   MCHC 35.8 30.0 - 36.0 g/dL   RDW 16.6 (H) 11.5 - 15.5 %   Platelets 182 150 - 400 K/uL    Current Facility-Administered Medications  Medication Dose Route Frequency Provider Last Rate Last Dose  . acetaminophen (TYLENOL) tablet 650 mg  650 mg Oral Q6H PRN Fuller Plan A, MD       Or  . acetaminophen (TYLENOL) suppository 650 mg  650 mg Rectal Q6H PRN Fuller Plan  A, MD      . chlordiazePOXIDE (LIBRIUM) capsule 25 mg  25 mg Oral Q6H PRN Charlynne Cousins, MD      . chlordiazePOXIDE (LIBRIUM) capsule 25 mg  25 mg Oral TID Charlynne Cousins, MD   25 mg at 11/27/16 1014   Followed by  . chlordiazePOXIDE (LIBRIUM) capsule 25 mg  25 mg Oral BH-qamhs Charlynne Cousins, MD       Followed by  . [START ON 11/28/2016] chlordiazePOXIDE (LIBRIUM) capsule 25 mg  25 mg Oral Daily Charlynne Cousins, MD      . feeding supplement (ENSURE ENLIVE) (ENSURE ENLIVE) liquid 237 mL  237 mL Oral BID BM Charlynne Cousins, MD   237 mL at 11/27/16 1014  . ferrous sulfate tablet 325 mg  325 mg Oral TID WC Charlynne Cousins, MD      . folic acid (FOLVITE) tablet 1 mg  1 mg Oral Daily Charlynne Cousins, MD   1 mg at 11/27/16 1013  . HYDROcodone-acetaminophen (NORCO/VICODIN) 5-325 MG per tablet 1-2 tablet  1-2 tablet Oral Q6H PRN Opyd, Ilene Qua, MD   2 tablet at 11/26/16 1740  . hydrOXYzine (ATARAX/VISTARIL) tablet 25 mg  25 mg Oral Q6H PRN Charlynne Cousins, MD   25 mg at 11/27/16 0349  . insulin aspart (novoLOG) injection 0-5 Units  0-5 Units Subcutaneous QHS Charlynne Cousins, MD   3 Units at 11/25/16 2221  . insulin aspart (novoLOG) injection 0-9 Units  0-9 Units Subcutaneous TID WC Charlynne Cousins, MD   1 Units at 11/26/16 0750  . insulin aspart (novoLOG) injection 3 Units  3 Units Subcutaneous TID WC Charlynne Cousins, MD   3 Units at 11/26/16 0750  . insulin detemir (LEVEMIR) injection 5 Units  5 Units Subcutaneous BID Charlynne Cousins, MD   5 Units at 11/26/16 2237  . ipratropium-albuterol (DUONEB) 0.5-2.5 (3) MG/3ML nebulizer solution 3 mL  3 mL Nebulization Q4H PRN Fuller Plan A, MD   3 mL at 11/26/16 2311  . ipratropium-albuterol (DUONEB) 0.5-2.5 (3) MG/3ML nebulizer solution 3 mL  3 mL Nebulization BID Charlynne Cousins, MD   3 mL at 11/27/16 0844  . liver oil-zinc oxide (DESITIN) 40 % ointment   Topical BID Charlynne Cousins, MD      . loperamide (IMODIUM) capsule 2-4 mg  2-4 mg Oral PRN Charlynne Cousins, MD    4 mg at 11/27/16 0349  . metoprolol succinate (TOPROL-XL) 24 hr tablet 12.5 mg  12.5 mg Oral Daily Charlynne Cousins, MD   12.5 mg at 11/27/16 1013  . multivitamin with minerals tablet 1 tablet  1 tablet Oral Daily Charlynne Cousins, MD   1 tablet at 11/27/16 1013  . nicotine (NICODERM CQ - dosed in mg/24 hours) patch 21 mg  21 mg Transdermal Daily Antonietta Breach, PA-C   21 mg at 11/27/16 1017  . ondansetron (ZOFRAN) tablet 4 mg  4 mg Oral Q6H PRN Fuller Plan A, MD   4 mg at 11/25/16 2203   Or  . ondansetron (ZOFRAN) injection 4 mg  4 mg Intravenous Q6H PRN Smith, Rondell A, MD      . phosphorus (K PHOS NEUTRAL) tablet 500 mg  500 mg Oral TID Charlynne Cousins, MD   500 mg at 11/27/16 1012  . polyethylene glycol (MIRALAX / GLYCOLAX) packet 17 g  17 g Oral BID Charlynne Cousins, MD   17 g at 11/26/16  2235  . thiamine (B-1) injection 100 mg  100 mg Intramuscular Once Aileen Fass, Tammi Klippel, MD      . thiamine (VITAMIN B-1) tablet 100 mg  100 mg Oral Daily Charlynne Cousins, MD   100 mg at 11/27/16 1013    Musculoskeletal: Strength & Muscle Tone: decreased Gait & Station: unable to stand Patient leans: N/A  Psychiatric Specialty Exam: Physical Exam has a history and physical   ROS unable to perform secondary to current mental status- altered   Blood pressure 111/80, pulse (!) 110, temperature 98.1 F (36.7 C), temperature source Oral, resp. rate 20, height 5' 8"  (1.727 m), weight 65.8 kg (145 lb), SpO2 (!) 9 %.Body mass index is 22.05 kg/m.  General Appearance: Disheveled and Guarded  Eye Contact:  Minimal  Speech:  Clear and Coherent and Slow  Volume:  Decreased  Mood:  Anxious and Depressed  Affect:  Depressed and Flat  Thought Process:  Coherent  Orientation:  Full (Time, Place, and Person)  Thought Content:  Illogical and no abstract reasoning  Suicidal Thoughts:  No  Homicidal Thoughts:  No  Memory:  Immediate;   Fair Recent;   Poor Remote;   Good  Judgement:   Impaired  Insight:  Lacking  Psychomotor Activity:  Decreased  Concentration:  Concentration: Fair and Attention Span: Poor  Recall:  Luke of Knowledge:  Good  Language:  Good  Akathisia:  Negative  Handed:  Right  AIMS (if indicated):     Assets:  Communication Skills Desire for Improvement Financial Resources/Insurance Housing Leisure Time  ADL's:  Impaired  Cognition:  Impaired,  Moderate  Sleep:        Treatment Plan Summary: Patient with a history of alcohol dependence presented with altered mental status secondary to unable to care for himself and did not seek for detox treatment or rehabilitation secondary to lack of insight into his drinking habits and required treatment needs.   Recommendation:  Based on my evaluation patient does not meet criteria for capacity to make his own medical decisions are living arrangements at this time.  Referred to the CSW to obtain medical care power of attorney to his family members if needed recommended no psychotropic medication management at this time.  Appreciate psychiatric consultation and we sign off as of today Please contact [phone] or [phone] if needs further assistance  Disposition: Patient may benefit in better off with a short-term skilled nursing facility with rehabilitation services and medically stable.  Supportive therapy provided about ongoing stressors.  Ambrose Finland, MD 11/27/2016 12:02 PM

## 2016-11-27 NOTE — Evaluation (Signed)
Physical Therapy Evaluation Patient Details Name: Patrick [name] MRN: [id number] DOB: February 19, 1951 Today's Date: 11/27/2016   History of Present Illness  Patrick [name] is a 66 y.o. male with medical history significant of alcohol abuse, depression, and GERD; who was  involuntary committed by family members for inability to care for herself.  He had reportedly been sitting in the floor for 3 days .  Clinical Impression  The patient requires 2 assist  For pivot transfers. Demonstrates poor standing balance. Pt admitted with above diagnosis. Pt currently with functional limitations due to the deficits listed below (see PT Problem List).  Pt will benefit from skilled PT to increase their independence and safety with mobility to allow discharge to the venue listed below.       Follow Up Recommendations SNF;Supervision/Assistance - 24 hour    Equipment Recommendations  None recommended by PT    Recommendations for Other Services       Precautions / Restrictions Precautions Precautions: Fall      Mobility  Bed Mobility Overal bed mobility: Needs Assistance Bed Mobility: Supine to Sit     Supine to sit: Mod assist     General bed mobility comments: extra time to, assist with the legs to bed edge, assist with trunk and to scoot forward.  Transfers Overall transfer level: Needs assistance Equipment used: Rolling walker (2 wheeled) Transfers: Sit to/from [facility] Sit to Stand: Max assist;+2 physical assistance;+2 safety/equipment;From elevated surface Stand pivot transfers: Max assist;+2 physical assistance;+2 safety/equipment       General transfer comment: assist tio pwer up to stand at RW x 1with  legs sliding forward, patient sat back down.  Assisted to power up to stand again shuffle  slide steps to recliner, decreased weight shift and does not lift feet.  Assisted to sit down into recliner.  Ambulation/Gait                Stairs             Wheelchair Mobility    Modified Rankin (Stroke Patients Only)       Balance Overall balance assessment: History of Falls;Needs assistance Sitting-balance support: Feet supported;Bilateral upper extremity supported Sitting balance-Leahy Scale: Poor   Postural control: Posterior lean Standing balance support: During functional activity;Bilateral upper extremity supported Standing balance-Leahy Scale: Zero                               Pertinent Vitals/Pain Pain Assessment: Faces Faces Pain Scale: Hurts even more Pain Location: buttocks, excoriated Pain Descriptors / Indicators: Burning Pain Intervention(s): Limited activity within patient's tolerance;Monitored during session    Home Living Family/patient expects to be discharged to:: Unsure Living Arrangements: Alone                    Prior Function Level of Independence: Independent               Hand Dominance        Extremity/Trunk Assessment   Upper Extremity Assessment Upper Extremity Assessment: Generalized weakness;LUE deficits/detail LUE Deficits / Details: noted resting tremor    Lower Extremity Assessment Lower Extremity Assessment: Generalized weakness    Cervical / Trunk Assessment Cervical / Trunk Assessment: Kyphotic  Communication   Communication: No difficulties  Cognition Arousal/Alertness: Awake/alert Behavior During Therapy: Flat affect Overall Cognitive Status: Impaired/Different from baseline Area of Impairment: Orientation  Orientation Level: Time;Situation             General Comments: Patient required much encouragement to mobilize      General Comments      Exercises     Assessment/Plan    PT Assessment Patient needs continued PT services  PT Problem List Decreased strength;Decreased activity tolerance;Decreased balance;Decreased mobility;Decreased knowledge of precautions;Decreased safety awareness;Decreased  knowledge of use of DME;Decreased cognition       PT Treatment Interventions DME instruction;Gait training;Functional mobility training;Therapeutic activities;Patient/family education    PT Goals (Current goals can be found in the Care Plan section)  Acute Rehab PT Goals Patient Stated Goal: none stated other than" can we do this tomorrow?" PT Goal Formulation: With patient/family Time For Goal Achievement: 12/11/16 Potential to Achieve Goals: Fair    Frequency Min 2X/week   Barriers to discharge Decreased caregiver support      Co-evaluation               AM-PAC PT "6 Clicks" Daily Activity  Outcome Measure Difficulty turning over in bed (including adjusting bedclothes, sheets and blankets)?: Unable Difficulty moving from lying on back to sitting on the side of the bed? : Unable Difficulty sitting down on and standing up from a chair with arms (e.g., wheelchair, bedside commode, etc,.)?: Unable Help needed moving to and from a bed to chair (including a wheelchair)?: Total Help needed walking in hospital room?: Total Help needed climbing 3-5 steps with a railing? : Total 6 Click Score: 6    End of Session Equipment Utilized During Treatment: Gait belt Activity Tolerance: Patient tolerated treatment well Patient left: in chair;with nursing/sitter in room;with family/visitor present Nurse Communication: Mobility status PT Visit Diagnosis: Difficulty in walking, not elsewhere classified (R26.2);History of falling (Z91.81);Adult, failure to thrive (R62.7)    Time: 1206-1222 PT Time Calculation (min) (ACUTE ONLY): 16 min   Charges:   PT Evaluation $PT Eval Low Complexity: 1 Low     PT G CodesTresa Endo PT [phone]  Claretha Cooper 11/27/2016, 12:42 PM

## 2016-11-28 DIAGNOSIS — F1010 Alcohol abuse, uncomplicated: Secondary | ICD-10-CM | POA: Diagnosis not present

## 2016-11-28 DIAGNOSIS — N179 Acute kidney failure, unspecified: Secondary | ICD-10-CM | POA: Diagnosis not present

## 2016-11-28 DIAGNOSIS — D649 Anemia, unspecified: Secondary | ICD-10-CM | POA: Diagnosis not present

## 2016-11-28 DIAGNOSIS — E876 Hypokalemia: Secondary | ICD-10-CM | POA: Diagnosis not present

## 2016-11-28 LAB — GLUCOSE, CAPILLARY
GLUCOSE-CAPILLARY: 85 mg/dL (ref 65–99)
Glucose-Capillary: 107 mg/dL — ABNORMAL HIGH (ref 65–99)
Glucose-Capillary: 100 mg/dL — ABNORMAL HIGH (ref 65–99)
Glucose-Capillary: 101 mg/dL — ABNORMAL HIGH (ref 65–99)

## 2016-11-28 LAB — RENAL FUNCTION PANEL
ANION GAP: 8 (ref 5–15)
Albumin: 2.8 g/dL — ABNORMAL LOW (ref 3.5–5.0)
BUN: 15 mg/dL (ref 6–20)
CHLORIDE: 108 mmol/L (ref 101–111)
CO2: 26 mmol/L (ref 22–32)
CREATININE: 0.93 mg/dL (ref 0.61–1.24)
Calcium: 6.9 mg/dL — ABNORMAL LOW (ref 8.9–10.3)
Glucose, Bld: 109 mg/dL — ABNORMAL HIGH (ref 65–99)
Phosphorus: 5.6 mg/dL — ABNORMAL HIGH (ref 2.5–4.6)
Potassium: 4.4 mmol/L (ref 3.5–5.1)
Sodium: 142 mmol/L (ref 135–145)

## 2016-11-28 LAB — TSH: TSH: 1.642 u[IU]/mL (ref 0.350–4.500)

## 2016-11-28 MED ORDER — METOPROLOL SUCCINATE ER 25 MG PO TB24
12.5000 mg | ORAL_TABLET | Freq: Every day | ORAL | Status: DC
  Administered 2016-11-29 – 2016-12-01 (×3): 12.5 mg via ORAL
  Filled 2016-11-28 (×4): qty 1

## 2016-11-28 MED ORDER — POLYETHYLENE GLYCOL 3350 17 G PO PACK
17.0000 g | PACK | Freq: Every day | ORAL | Status: DC
Start: 2016-11-28 — End: 2016-12-01
  Administered 2016-11-29 – 2016-12-01 (×3): 17 g via ORAL
  Filled 2016-11-28 (×4): qty 1

## 2016-11-28 MED ORDER — RESOURCE THICKENUP CLEAR PO POWD
ORAL | Status: DC | PRN
  Filled 2016-11-28: qty 125

## 2016-11-28 MED ORDER — FERROUS SULFATE 325 (65 FE) MG PO TABS
325.0000 mg | ORAL_TABLET | Freq: Three times a day (TID) | ORAL | Status: DC
  Administered 2016-11-29 – 2016-12-01 (×6): 325 mg via ORAL
  Filled 2016-11-28 (×7): qty 1

## 2016-11-28 MED ORDER — METOPROLOL SUCCINATE ER 25 MG PO TB24: 25.0000 mg | ORAL_TABLET | Freq: Every day | ORAL | Status: DC

## 2016-11-28 NOTE — Clinical Social Work Note (Signed)
Clinical Social Work Assessment  Patient Details  Name: Patrick [name] MRN: [id number] Date of Birth: February 27, 1951  Date of referral:  11/27/16               Reason for consult:  Facility Placement                Permission sought to share information with:  Facility Sport and exercise psychologist, Family Supports Permission granted to share information::  Yes, Verbal Permission Granted  Name::     Reita Cliche  Agency::     Relationship::  sister  Contact Information:  [phone]  Housing/Transportation Living arrangements for the past 2 months:  Apartment Source of Information:  Patient, Other (Comment Required) (Sister - Reita Cliche) Patient Interpreter Needed:  None Criminal Activity/Legal Involvement Pertinent to Current Situation/Hospitalization:  No - Comment as needed Significant Relationships:  Adult Children Lives with:  Self Do you feel safe going back to the place where you live?   (PT recommending SNF) Need for family participation in patient care:  Yes (Comment)  Care giving concerns:  Patient resides alone and has no one to care for him. Patient's adult sons were staying with him to assist with care but both are now in rehab. Patient has been experiencing weakness and having difficulty getting around. PT recommended SNF for ST rehab.    Social Worker assessment / plan:  CSW spoke with patient and patient's sister at bedside regarding events that led patient to the hospital. Patient reported that he has been experiencing weakness for the past month. Patient reported that prior to becoming weak he was independent with ADLs. Patient's sister reported that patient lost his job a couple of years ago and is unsure about his insurance status. Patient reported that he still has insurance and has a retirement account that he has been using to pay his bills. Patient reported that he still handles all of his finances. Patient's sister brought up patient's alcohol abuse, CSW inquired about  patient's perception of his alcohol use. Patient reported that he sees his alcohol use as an issue but is not open to treatment. CSW attempted to probe further, patient was not able to elaborate about why he is not open to treatment. CSW and patient discussed possible PT recommendations. Patient reported that he is open to SNF if it is recommended by PT. Patient granted CSW verbal permission to update his sister.   Patient is to be seen by psych and PT. CSW will continue to follow and assist with discharge planning.   Employment status:  Unemployed Forensic scientist:  Other (Comment Required) Nurse, mental health) PT Recommendations:  [place] / Referral to community resources:  [place]  Patient/Family's Response to care:  Patient's sister was appreciative of CSW assistance with discharge planning.   Patient/Family's Understanding of and Emotional Response to Diagnosis, Current Treatment, and Prognosis:  Patient presented quiet throughout the majority of the assessment. Patient's sister verbalized understanding of diagnosis and current treatment.   Emotional Assessment Appearance:  Appears older than stated age, Disheveled Attitude/Demeanor/Rapport:  Apprehensive Affect (typically observed):  Quiet Orientation:  Oriented to Self, Oriented to Place, Oriented to  Time, Oriented to Situation Alcohol / Substance use:  Alcohol Use Psych involvement (Current and /or in the community):  Yes (Comment)  Discharge Needs  Concerns to be addressed:  Discharge Planning Concerns Readmission within the last 30 days:  No Current discharge risk:  Lives alone Barriers to Discharge:  Continued Medical Work Up  Burnis Medin, LCSW 11/28/2016, 8:34 AM

## 2016-11-28 NOTE — Progress Notes (Signed)
Patient has been coughing with food and drink. Provider paged with update. Will hold po meds until further notice.

## 2016-11-28 NOTE — Plan of Care (Signed)
Problem: Safety: Goal: Ability to remain free from injury will improve Outcome: Completed/Met Date Met: 11/28/16 Sitter remains at bedside. Pt denies SI/HI.

## 2016-11-28 NOTE — Progress Notes (Addendum)
[facility] PROGRESS NOTE    Progress Note  Patrick [name]  HAL:[id number] DOB: 1950/11/16 DOA: 11/25/2016 PCP: [name], MD     Brief Narrative:   Patrick [name] is an 66 y.o. male past medical history of alcohol abuse depression was involuntarily committed by family members her inability to care for himself patient is a poor historian and he is encephalopathic has been sitting at home for 3 days and his face use any urine.  Assessment/Plan:   Symptomatic anemia: Hbg on January of last year was 12.  Negative FOBT's. Has been transfuse 1 unit of packed red blood cells. Hemoglobin stable at 8.3 He is tolerating her diet had several bowel movements which were brown FOBT is really negative. Ferrous sulfate daily. Psychiatry was consulted who deemed the patient not have capacity to make medical decisions. We have consulted social worker to help with healthcare power of attorney. Physical therapy evaluated the patient recommended skilled nursing facility. He was coughing with eating Richard a swallowing evaluation.  Acute kidney injury: In the setting of ARB use. Baseline creatinine is less than 1. Likely prerenal in etiology, now resolved.  Mild rhabdomyolysis: Resolved with IV fluid hydration.  EtOH abuse: Continue thiamine and folate, monitor with CIWA protocol.  Electrolyte abnormalities: Hyponatremia, hypokalemia, hypomagnesemia, hypophosphatemia:  electrolyte abnormalities have resolved.  Essential hypertension:  continue to hold antiepileptic medication.  transaminitis: With a 2:1 ratio AST 2 ALT likely due to alcohol.  Stage II pressure ulcer: Wound care.  Thrombocytopenia: Likely due to alcohol abuse.  Sinus tachycardia: Differential includes He is not having fever, he doesn't seem volume depleted or septic has mild anemia, which she is on ferrous sulfate and vitamin supplements. He is not hypoxic and not anxious, no pulmonary problems or  cardiac. Has not been exposed to new medications or stimulants except nicotine patch Most likely due to deconditioning. We'll start low dose metoprolol check a 2-D echo. Check TSH and cortisol level in the morning. He is also coughing with eating and drinking, place NPO check a SLP. Has remained afebirle with no leukocytosis will not start abtibiotics   Severe protein caloric malnutrition. Malnutrition Type:  Nutrition Problem: Malnutrition (Moderate) Etiology: social / environmental circumstances (alcohol abuse)   Malnutrition Characteristics:  Signs/Symptoms: percent weight loss, moderate depletions of muscle mass, moderate depletion of body fat   Nutrition Interventions:  Interventions: Ensure Enlive (each supplement provides 350kcal and 20 grams of protein)  DVT prophylaxis: SCD's Family Communication:none Disposition Plan/Barrier to D/C:[place] to 3 days. Code Status:     Code Status Orders        Start     Ordered   11/25/16 0454  Full code  Continuous     11/25/16 0458    Code Status History    Date Active Date Inactive Code Status Order ID Comments User Context   11/25/2016  1:40 AM 11/25/2016  4:58 AM Full Code [id number]  Antonietta Breach, PA-C ED        IV Access:    Peripheral IV   Procedures and diagnostic studies:   No results found.   Medical Consultants:    None.  Anti-Infectives:   None  Subjective:    Patrick [name] evaluate known you complains, spoke with sister at bedside.  Objective:    Vitals:   11/27/16 1100 11/27/16 1319 11/27/16 2241 11/28/16 0551  BP:  118/74 132/81 120/87  Pulse:  (!) 104 (!) 118 (!) 112  Resp:  20 16 18  Temp:  98.4 F (36.9 C) 99.1 F (37.3 C) 97.8 F (36.6 C)  TempSrc:  Oral Oral Oral  SpO2: 95% 100% 100% 96%  Weight:      Height:        Intake/Output Summary (Last 24 hours) at 11/28/16 0801 Last data filed at 11/28/16 0500  Gross per 24 hour  Intake             1560 ml  Output              1600 ml  Net              -40 ml   Filed Weights   11/25/16 0104  Weight: 65.8 kg (145 lb)    Exam: General exam: In no acute distress.Cachectic  Respiratory system: Good air movement and clear to auscultation. Cardiovascular system: Regular rate and rhythm with positive S1-S2 no murmurs gallops Gastrointestinal system: Abdomen soft nontender nondistended. Extremities: No lower extremity edema. Skin: No rashes, lesions or ulcers Psychiatry: Judgement and insight appear normal. Mood & affect appropriate.    Data Reviewed:    Labs: Basic Metabolic Panel:  Recent Labs Lab 11/25/16 0210 11/25/16 0340 11/25/16 1453 11/25/16 1833 11/26/16 0343 11/27/16 0435 11/28/16 0434  NA 125*  --  133*  --  136 141 142  K 3.1*  --  3.5  --  4.1 4.4 4.4  CL 89*  --  104  --  109 111 108  CO2 20*  --  17*  --  19* 21* 26  GLUCOSE 123*  --  269*  --  148* 75 109*  BUN 47*  --  32*  --  30* 16 15  CREATININE 2.74*  --  1.68*  --  1.29* 1.00 0.93  CALCIUM 7.7*  --  6.9*  --  7.2* 7.1* 6.9*  MG  --  0.5*  --  1.5*  --   --   --   PHOS  --   --  1.9*  1.9*  --  1.2* 4.1 5.6*   GFR Estimated Creatinine Clearance: 75.7 mL/min (by C-G formula based on SCr of 0.93 mg/dL). Liver Function Tests:  Recent Labs Lab 11/25/16 0210 11/25/16 1453 11/26/16 0343 11/27/16 0435 11/28/16 0434  AST 71*  --   --   --   --   ALT 19  --   --   --   --   ALKPHOS 45  --   --   --   --   BILITOT 1.6*  --   --   --   --   PROT 6.7  --   --   --   --   ALBUMIN 3.4* 3.0* 2.8* 2.7* 2.8*   No results for input(s): LIPASE, AMYLASE in the last 168 hours.  Recent Labs Lab 11/25/16 0335  AMMONIA 19   Coagulation profile No results for input(s): INR, PROTIME in the last 168 hours.  CBC:  Recent Labs Lab 11/25/16 0210 11/25/16 1453 11/27/16 1015  WBC 7.1 3.5* 6.0  NEUTROABS 4.5  --   --   HGB 7.0* 8.3* 8.3*  HCT 18.8* 22.8* 23.2*  MCV 92.2 92.3 95.1  PLT 113* 111* 182   Cardiac  Enzymes:  Recent Labs Lab 11/25/16 0340 11/26/16 1324  CKTOTAL 1,131* 381   BNP (last 3 results) No results for input(s): PROBNP in the last 8760 hours. CBG:  Recent Labs Lab 11/27/16 0901 11/27/16 1211 11/27/16 1630 11/27/16 2206 11/28/16 8841  GLUCAP 74 96 112* 140* 85   D-Dimer: No results for input(s): DDIMER in the last 72 hours. Hgb A1c:  Recent Labs  11/25/16 1833  HGBA1C 4.7*   Lipid Profile: No results for input(s): CHOL, HDL, LDLCALC, TRIG, CHOLHDL, LDLDIRECT in the last 72 hours. Thyroid function studies: No results for input(s): TSH, T4TOTAL, T3FREE, THYROIDAB in the last 72 hours.  Invalid input(s): FREET3 Anemia work up:  Recent Labs  11/25/16 1833  VITAMINB12 709  FOLATE 23.0  FERRITIN 739*  TIBC 122*  IRON 44*  RETICCTPCT 0.9   Sepsis Labs:  Recent Labs Lab 11/25/16 0210 11/25/16 0221 11/25/16 1453 11/27/16 1015  WBC 7.1  --  3.5* 6.0  LATICACIDVEN  --  1.26  --   --    Microbiology Recent Results (from the past 240 hour(s))  MRSA PCR Screening     Status: None   Collection Time: 11/25/16  6:24 AM  Result Value Ref Range Status   MRSA by PCR NEGATIVE NEGATIVE Final    Comment:        The GeneXpert MRSA Assay (FDA approved for NASAL specimens only), is one component of a comprehensive MRSA colonization surveillance program. It is not intended to diagnose MRSA infection nor to guide or monitor treatment for MRSA infections.      Medications:   . chlordiazePOXIDE  25 mg Oral BH-qamhs   Followed by  . chlordiazePOXIDE  25 mg Oral Daily  . feeding supplement (ENSURE ENLIVE)  237 mL Oral BID BM  . ferrous sulfate  325 mg Oral TID WC  . folic acid  1 mg Oral Daily  . ipratropium-albuterol  3 mL Nebulization BID  . liver oil-zinc oxide   Topical BID  . metoprolol succinate  12.5 mg Oral Daily  . multivitamin with minerals  1 tablet Oral Daily  . nicotine  21 mg Transdermal Daily  . phosphorus  500 mg Oral TID  .  polyethylene glycol  17 g Oral BID  . thiamine  100 mg Intramuscular Once  . thiamine  100 mg Oral Daily   Continuous Infusions:    LOS: 3 days   Charlynne Cousins  [facility] Pager [phone]  *Please refer to [email or web address], password [email or web address] to get updated schedule on who will round on this patient, as hospitalists switch teams weekly. If 7PM-7AM, please contact night-coverage at [email or web address], password [email or web address] for any overnight needs.  11/28/2016, 8:01 AM

## 2016-11-28 NOTE — Progress Notes (Signed)
Took over care of patient at approximately 1300; agree with the previous RN assessment on patient. Will continue to monitor patient for any changes.  Carmela Hurt, RN

## 2016-11-28 NOTE — Progress Notes (Signed)
CSW staffed case with CSW Surveyor, quantity in regards to medical hcpoa paperwork. CSW Surveyor, quantity reported that since patient has been deemed to not have capacity to make medical decisions patient is unable to complete medical hcpoa paperwork. CSW will contact available next of kin to assist with discharge planning.   Abundio Miu, [place] Social Worker [facility] Cell#: [phone]

## 2016-11-28 NOTE — Evaluation (Signed)
Clinical/Bedside Swallow Evaluation Patient Details  Name: Patrick [name] MRN: [id number] Date of Birth: 1950/04/19  Today's Date: 11/28/2016 Time: SLP Start Time (ACUTE ONLY): 9381 SLP Stop Time (ACUTE ONLY): 1715 SLP Time Calculation (min) (ACUTE ONLY): 25 min  Past Medical History:  Past Medical History:  Diagnosis Date  . Colon polyps   . Depression   . Diverticulitis   . Diverticulosis of colon   . GERD (gastroesophageal reflux disease)   . Kidney stone    Past Surgical History:  Past Surgical History:  Procedure Laterality Date  . COLONOSCOPY    . UPPER GASTROINTESTINAL ENDOSCOPY     HPI:  66 yo male adm [facility]  - found at home where he had been sitting for multiple days in his urine.  Pt has h/o etoh use, GERD and is for involuntary commitment due to concerns for his well being.  RN noted pt was coughing with intake = and swallow evaluation was ordered.  Pt states he coughs all the time.    Assessment / Plan / Recommendation Clinical Impression  Pt presents with clinical indications concerning for oropharyngeal dysphagia/aspiration.  He demonsrates congested breathing quality at baseline.  Oral transiting delays noted vs oral holding with pt requiring cues to swallow at times.  Improved tolerance of nectar via tsp and puree observed.  Overt coughing immediately post swallow of thin and cracker with poor pt awareness.  Question component of chronic aspiration given pt lack of awareness.  Recommend npo x tsps nectar thick water, medicine with puree and MBS next am.  Pt reluctantly agreeable to plan.  SLP Visit Diagnosis: Dysphagia, oropharyngeal phase (R13.12)    Aspiration Risk  Severe aspiration risk;Risk for inadequate nutrition/hydration    Diet Recommendation NPO except meds (tsps nectar thick water)   Liquid Administration via: Spoon Medication Administration: Whole meds with puree Supervision: Staff to assist with self feeding Compensations: Slow rate;Small  sips/bites    Other  Recommendations Oral Care Recommendations: Oral care BID   Follow up Recommendations  (tbd)      Frequency and Duration min 2x/week  1 week       Prognosis Prognosis for Safe Diet Advancement: Guarded Barriers to Reach Goals: Severity of deficits      Swallow Study   General Date of Onset: 11/28/16 HPI: 66 yo male adm to [facility]  - found at home where he had been sitting for multiple days in his urine.  Pt has h/o etoh use, GERD and is for involuntary commitment due to concerns for his well being.  RN noted pt was coughing with intake = and swallow evaluation was ordered.  Pt states he coughs all the time.  Type of Study: Bedside Swallow Evaluation Diet Prior to this Study: NPO Temperature Spikes Noted: Yes (99.1) Respiratory Status: Room air History of Recent Intubation: No Behavior/Cognition: Alert;Requires cueing Oral Cavity Assessment: Dry Oral Care Completed by SLP: No Oral Cavity - Dentition: Poor condition Vision: Functional for self-feeding Self-Feeding Abilities: Needs assist (secondary to ? mild tremor in right hand) Patient Positioning: Upright in bed Baseline Vocal Quality: Hoarse Volitional Cough: Weak;Congested Volitional Swallow: Unable to elicit    Oral/Motor/Sensory Function Overall Oral Motor/Sensory Function: Generalized oral weakness   Ice Chips Other Comments: pt does not like ice chips   Thin Liquid Thin Liquid: Impaired Presentation: Spoon;Self Fed Oral Phase Functional Implications: Prolonged oral transit;Oral holding Pharyngeal  Phase Impairments: Suspected delayed Swallow;Cough - Immediate    Nectar Thick Nectar Thick Liquid: Impaired  Presentation: Cup;Self Fed;Spoon Oral phase functional implications: Prolonged oral transit;Oral holding Pharyngeal Phase Impairments: Cough - Delayed   Honey Thick Honey Thick Liquid: Impaired Presentation: Cup;Self fed Oral Phase Functional Implications: Prolonged oral transit;Oral  holding Pharyngeal Phase Impairments: Cough - Delayed   Puree Puree: Impaired Presentation: Spoon Oral Phase Functional Implications: Prolonged oral transit;Oral holding Pharyngeal Phase Impairments: Suspected delayed Swallow   Solid   GO   Solid: Impaired Presentation: Self Fed Oral Phase Impairments: Reduced lingual movement/coordination;Impaired mastication Oral Phase Functional Implications: Oral holding;Prolonged oral transit;Impaired mastication Pharyngeal Phase Impairments: Cough - Immediate        Macario Golds 11/28/2016,6:08 PM   Luanna Salk, [place] [facility] SLP [phone]

## 2016-11-28 NOTE — Progress Notes (Signed)
CSW provided bed offers, patient selected [facility]. CSW provided update to patient's sister Reita Cliche ([phone]), sister agreed with patient's SNF selection. Patient's sister reported that patient's oldest sister would be contacting CSW to make further decisions. CSW contacted [facility] SN[facility]. Staff member Sharyn Lull reported that she will start insurance authorization.   Abundio Miu, [place] Social Worker [facility] Cell#: [phone]

## 2016-11-29 ENCOUNTER — Inpatient Hospital Stay (HOSPITAL_COMMUNITY): Payer: BLUE CROSS/BLUE SHIELD

## 2016-11-29 DIAGNOSIS — I361 Nonrheumatic tricuspid (valve) insufficiency: Secondary | ICD-10-CM

## 2016-11-29 DIAGNOSIS — F10230 Alcohol dependence with withdrawal, uncomplicated: Secondary | ICD-10-CM | POA: Diagnosis not present

## 2016-11-29 DIAGNOSIS — R062 Wheezing: Secondary | ICD-10-CM

## 2016-11-29 DIAGNOSIS — D649 Anemia, unspecified: Secondary | ICD-10-CM | POA: Diagnosis not present

## 2016-11-29 DIAGNOSIS — R1312 Dysphagia, oropharyngeal phase: Secondary | ICD-10-CM | POA: Diagnosis not present

## 2016-11-29 DIAGNOSIS — R5383 Other fatigue: Secondary | ICD-10-CM | POA: Diagnosis not present

## 2016-11-29 DIAGNOSIS — R5381 Other malaise: Secondary | ICD-10-CM | POA: Diagnosis not present

## 2016-11-29 DIAGNOSIS — L89152 Pressure ulcer of sacral region, stage 2: Secondary | ICD-10-CM | POA: Diagnosis not present

## 2016-11-29 DIAGNOSIS — F1010 Alcohol abuse, uncomplicated: Secondary | ICD-10-CM | POA: Diagnosis not present

## 2016-11-29 DIAGNOSIS — D696 Thrombocytopenia, unspecified: Secondary | ICD-10-CM | POA: Diagnosis not present

## 2016-11-29 DIAGNOSIS — N179 Acute kidney failure, unspecified: Secondary | ICD-10-CM | POA: Diagnosis not present

## 2016-11-29 LAB — RENAL FUNCTION PANEL
ALBUMIN: 2.7 g/dL — AB (ref 3.5–5.0)
ANION GAP: 10 (ref 5–15)
BUN: 14 mg/dL (ref 6–20)
CALCIUM: 7 mg/dL — AB (ref 8.9–10.3)
CO2: 26 mmol/L (ref 22–32)
CREATININE: 0.87 mg/dL (ref 0.61–1.24)
Chloride: 104 mmol/L (ref 101–111)
GFR calc Af Amer: >60 mL/min (ref >60)
GFR calc non Af Amer: >60 mL/min (ref >60)
GLUCOSE: 97 mg/dL (ref 65–99)
PHOSPHORUS: 5.1 mg/dL — AB (ref 2.5–4.6)
Potassium: 3.8 mmol/L (ref 3.5–5.1)
SODIUM: 140 mmol/L (ref 135–145)

## 2016-11-29 LAB — ECHOCARDIOGRAM COMPLETE
Height: 68 in
WEIGHTICAEL: 2320 [oz_av]

## 2016-11-29 LAB — BPAM RBC
BLOOD PRODUCT EXPIRATION DATE: 201808312359
Blood Product Expiration Date: 201808312359
ISSUE DATE / TIME: 201808181108
UNIT TYPE AND RH: 6200
UNIT TYPE AND RH: 6200

## 2016-11-29 LAB — TYPE AND SCREEN
ABO/RH(D): A POS
ANTIBODY SCREEN: NEGATIVE
UNIT DIVISION: 0
Unit division: 0

## 2016-11-29 LAB — PROCALCITONIN: Procalcitonin: 0.32 ng/mL

## 2016-11-29 LAB — CORTISOL: Cortisol, Plasma: 8.4 ug/dL

## 2016-11-29 LAB — D-DIMER, QUANTITATIVE (NOT AT ARMC): D DIMER QUANT: 2.48 ug{FEU}/mL — AB (ref 0.00–0.50)

## 2016-11-29 MED ORDER — IOPAMIDOL (ISOVUE-370) INJECTION 76%
100.0000 mL | Freq: Once | INTRAVENOUS | Status: AC | PRN
  Administered 2016-11-29: 100 mL via INTRAVENOUS

## 2016-11-29 MED ORDER — FUROSEMIDE 10 MG/ML IJ SOLN
40.0000 mg | Freq: Once | INTRAMUSCULAR | Status: AC
Start: 2016-11-29 — End: 2016-11-29
  Administered 2016-11-29: 40 mg via INTRAVENOUS
  Filled 2016-11-29: qty 4

## 2016-11-29 MED ORDER — GERHARDT'S BUTT CREAM
1.0000 "application " | TOPICAL_CREAM | Freq: Two times a day (BID) | CUTANEOUS | Status: DC
  Administered 2016-11-29 – 2016-12-01 (×4): 1 via TOPICAL
  Filled 2016-11-29 (×2): qty 1

## 2016-11-29 MED ORDER — IOPAMIDOL (ISOVUE-370) INJECTION 76%
INTRAVENOUS | Status: AC
  Filled 2016-11-29: qty 100

## 2016-11-29 NOTE — Progress Notes (Signed)
  Speech Language Pathology Treatment: Dysphagia  Patient Details Name: Patrick [name] MRN: [id number] DOB: 1950/12/10 Today's Date: 11/29/2016 Time: 1660-6301 SLP Time Calculation (min) (ACUTE ONLY): 16 min  Assessment / Plan / Recommendation Clinical Impression  Posted signs in pt's room and re-educated him to test results findings and recommendations.  Pt denies knowledge of results of swallowing test despite extensive education.  SLP observed pt consuming nectar thickened orange juice -  Cough x1/3 boluses with pt requiring total cues to conduct dry swallows.  Pt stated "this is ridiculous" = suspect compliance and intake with be inadequate.  Again suspect pt with chronic dysphagia for which he has poor awareness.  Will follow closely but risk is chronic.   HPI HPI: 66 yo male adm to [facility]  - found at home where he had been sitting for multiple days in his urine.  Pt has h/o etoh use, GERD and is for involuntary commitment due to concerns for his well being.  RN noted pt was coughing with intake = and swallow evaluation was ordered.  Pt states he coughs all the time.  MBS indicatd to allow instrumental eval and determine least restrictive diet.       SLP Plan  Continue with current plan of care       Recommendations  Diet recommendations: Dysphagia 1 (puree);Nectar-thick liquid Liquids provided via: Straw;Cup Medication Administration: Crushed with puree Supervision: Full supervision/cueing for compensatory strategies Compensations: Slow rate;Small sips/bites;Multiple dry swallows after each bite/sip;Follow solids with liquid Postural Changes and/or Swallow Maneuvers: Seated upright 90 degrees;Upright 30-60 min after meal                Oral Care Recommendations: Oral care QID Follow up Recommendations: Skilled Nursing facility SLP Visit Diagnosis: Dysphagia, oropharyngeal phase (R13.12) Plan: Continue with current plan of care       [place], Britiany Silbernagel  Ann 11/29/2016, 10:12 AM  Luanna Salk, [place] [facility] SLP [phone]

## 2016-11-29 NOTE — Progress Notes (Addendum)
PROGRESS NOTE  Patrick [name]  XBD:[id number] DOB: [birth year] DOA: 11/25/2016 PCP: [name], MD  Brief Narrative:   Patrick [name] is an 66 y.o. male past medical history of alcohol abuse depression was involuntarily committed by family members her inability to care for himself patient is a poor historian and he is encephalopathic has been sitting at home for 3 days and his face use any urine.  Assessment & Plan:   Principal Problem:   Symptomatic anemia Active Problems:   Thrombocytopenia (HCC)   Hypokalemia   Pressure ulcer of sacral region, stage 2   Alcohol abuse   Hypotension   Hypomagnesemia   Hyponatremia   Non-traumatic rhabdomyolysis  Symptomatic anemia, occult negative.  Likely due to chronic inflammation with elevated ferritin.  Folate, b12, and TSH wnl.   Hbg on January of last year was 12.  Negative FOBT's. Has been transfuse 1 unit of packed red blood cells. Hemoglobin stable at 8.3 Ferrous sulfate daily. GI bleed ruled out  Metabolic encephalopathy, improving.  Ammonia level wnl Psychiatry was consulted who deemed the patient not have capacity to make medical decisions. We have consulted social worker to help with healthcare power of attorney. Physical therapy evaluated the patient recommended skilled nursing facility.  Sinus tachycardia, likely related to alcohol withdrawal and deconditioning, HR trending down with metoprolol -  TSH 1.6 wnl -  Cortisol 8.4 -  D-dimer:  Positive -  CT angio chest -   ECHO:  Preserved EF  Dyspnea and wheezing -  CXR:  Possible infiltrate vs. Edema in the RUE.  I suspect this is partly due to volume overload from IVF and patient may have underlying cirrhosis.  He is afebrile -  Continue duonebs -  Check procalcitonin -  Lasix 40mg  IV once   Acute kidney injury due to ARB and dehydration, resolved  Mild rhabdomyolysis, Resolved with IV fluid hydration.  EtOH abuse:  Continue thiamine and folate Continue  CIWA protocol.  Electrolyte abnormalities: Hyponatremia, hypokalemia, hypomagnesemia, hypophosphatemia:  electrolyte abnormalities have resolved.  Essential hypertension:  continue to hold antiepileptic medication.  transaminitis: With a 2:1 ratio AST 2 ALT likely due to alcohol.  Stage II pressure ulcer plus irritant dermatitis from sitting in stool for several days Wound care.  Thrombocytopenia: Likely due to alcohol abuse.  Moderate protein caloric malnutrition. - Ensure Enlive  DVT prophylaxis:  SCDs Code Status:  none Family Communication:  Patient alone Disposition Plan:  SNF in 1-2 days.  F/u CT angio chest   Consultants:   Psychiatry  Procedures:  none  Antimicrobials:  Anti-infectives    None       Subjective:  Bottom hurts from rash.  Denies SOB/cough, but sitter has noticed him wheezing, breathing fast, and coughing occasionally.  Denies chest pains, nausea.    Objective: Vitals:   11/28/16 1339 11/28/16 1946 11/29/16 0631 11/29/16 1442  BP: 121/77  115/81 123/90  Pulse: (!) 117  (!) 106 96  Resp: 18  16 20   Temp: 98.2 F (36.8 C)  98.1 F (36.7 C) 97.6 F (36.4 C)  TempSrc: Oral  Oral Oral  SpO2: 95% 93% 99% 97%  Weight:      Height:        Intake/Output Summary (Last 24 hours) at 11/29/16 1540 Last data filed at 11/29/16 1443  Gross per 24 hour  Intake              170 ml  Output  950 ml  Net             -780 ml   Filed Weights   11/25/16 0104  Weight: 65.8 kg (145 lb)    Examination:  General exam:  Adult male.  Audible wheezing across the room  HEENT:  NCAT, MMM Respiratory system: mild tachypnea, wheezing throughout, no focal rales or rhonchi Cardiovascular system: Regular rate and rhythm, normal S1/S2. No murmurs, rubs, gallops or clicks.  Warm extremities Gastrointestinal system: Normal active bowel sounds, soft, mildly distended, nontender. MSK:  Normal tone and bulk, trace bilateral lower extremity  edema Neuro:  Grossly intact    Data Reviewed: I have personally reviewed following labs and imaging studies  CBC:  Recent Labs Lab 11/25/16 0210 11/25/16 1453 11/27/16 1015  WBC 7.1 3.5* 6.0  NEUTROABS 4.5  --   --   HGB 7.0* 8.3* 8.3*  HCT 18.8* 22.8* 23.2*  MCV 92.2 92.3 95.1  PLT 113* 111* 017   Basic Metabolic Panel:  Recent Labs Lab 11/25/16 0340 11/25/16 1453 11/25/16 1833 11/26/16 0343 11/27/16 0435 11/28/16 0434 11/29/16 0441  NA  --  133*  --  136 141 142 140  K  --  3.5  --  4.1 4.4 4.4 3.8  CL  --  104  --  109 111 108 104  CO2  --  17*  --  19* 21* 26 26  GLUCOSE  --  269*  --  148* 75 109* 97  BUN  --  32*  --  30* 16 15 14   CREATININE  --  1.68*  --  1.29* 1.00 0.93 0.87  CALCIUM  --  6.9*  --  7.2* 7.1* 6.9* 7.0*  MG 0.5*  --  1.5*  --   --   --   --   PHOS  --  1.9*  1.9*  --  1.2* 4.1 5.6* 5.1*   GFR: Estimated Creatinine Clearance: 80.9 mL/min (by C-G formula based on SCr of 0.87 mg/dL). Liver Function Tests:  Recent Labs Lab 11/25/16 0210 11/25/16 1453 11/26/16 0343 11/27/16 0435 11/28/16 0434 11/29/16 0441  AST 71*  --   --   --   --   --   ALT 19  --   --   --   --   --   ALKPHOS 45  --   --   --   --   --   BILITOT 1.6*  --   --   --   --   --   PROT 6.7  --   --   --   --   --   ALBUMIN 3.4* 3.0* 2.8* 2.7* 2.8* 2.7*   No results for input(s): LIPASE, AMYLASE in the last 168 hours.  Recent Labs Lab 11/25/16 0335  AMMONIA 19   Coagulation Profile: No results for input(s): INR, PROTIME in the last 168 hours. Cardiac Enzymes:  Recent Labs Lab 11/25/16 0340 11/26/16 1324  CKTOTAL 1,131* 381   BNP (last 3 results) No results for input(s): PROBNP in the last 8760 hours. HbA1C: No results for input(s): HGBA1C in the last 72 hours. CBG:  Recent Labs Lab 11/27/16 2206 11/28/16 0727 11/28/16 1139 11/28/16 1803 11/28/16 2130  GLUCAP 140* 85 107* 100* 101*   Lipid Profile: No results for input(s): CHOL, HDL,  LDLCALC, TRIG, CHOLHDL, LDLDIRECT in the last 72 hours. Thyroid Function Tests:  Recent Labs  11/28/16 1208  TSH 1.642   Anemia Panel: No results  for input(s): VITAMINB12, FOLATE, FERRITIN, TIBC, IRON, RETICCTPCT in the last 72 hours. Urine analysis:    Component Value Date/Time   COLORURINE yellow 02/21/2010 1055   APPEARANCEUR Clear 02/21/2010 1055   LABSPEC 1.025 02/21/2010 1055   PHURINE 5.0 02/21/2010 1055   HGBUR negative 02/21/2010 1055   BILIRUBINUR n 03/11/2014 1025   PROTEINUR n 03/11/2014 1025   UROBILINOGEN 0.2 03/11/2014 1025   UROBILINOGEN 0.2 02/21/2010 1055   NITRITE n 03/11/2014 1025   NITRITE negative 02/21/2010 1055   LEUKOCYTESUR Negative 03/11/2014 1025   Sepsis Labs: @LABRCNTIP (procalcitonin:4,lacticidven:4)  ) Recent Results (from the past 240 hour(s))  MRSA PCR Screening     Status: None   Collection Time: 11/25/16  6:24 AM  Result Value Ref Range Status   MRSA by PCR NEGATIVE NEGATIVE Final    Comment:        The GeneXpert MRSA Assay (FDA approved for NASAL specimens only), is one component of a comprehensive MRSA colonization surveillance program. It is not intended to diagnose MRSA infection nor to guide or monitor treatment for MRSA infections.       Radiology Studies: Dg Chest Port 1 View  Result Date: 11/29/2016 CLINICAL DATA:  Wheezing EXAM: PORTABLE CHEST 1 VIEW COMPARISON:  11/25/2016 FINDINGS: Heart size upper limits of normal. Aortic atherosclerosis. Hiatal hernia present. Question hazy infiltrate right lower lobe. The remainder the chest is clear. No effusions. No significant bone finding. IMPRESSION: Question AC pneumonia right lower lobe. Aortic atherosclerosis. Hiatal hernia. Electronically Signed   By: Nelson Chimes M.D.   On: 11/29/2016 11:18   Dg Swallowing Func-speech Pathology  Result Date: 11/29/2016 Objective Swallowing Evaluation: Type of Study: MBS-Modified Barium Swallow Study Patient Details Name: Patrick [name]  MRN: [id number] Date of Birth: [birth year] Today's Date: 11/29/2016 Time: SLP Start Time (ACUTE ONLY): 0845-SLP Stop Time (ACUTE ONLY): 0915 SLP Time Calculation (min) (ACUTE ONLY): 30 min Past Medical History: Past Medical History: Diagnosis Date . Colon polyps  . Depression  . Diverticulitis  . Diverticulosis of colon  . GERD (gastroesophageal reflux disease)  . Kidney stone  Past Surgical History: Past Surgical History: Procedure Laterality Date . COLONOSCOPY   . UPPER GASTROINTESTINAL ENDOSCOPY   HPI: 66 yo male adm to [facility]  - found at home where he had been sitting for multiple days in his urine.  Pt has h/o etoh use, GERD and is for involuntary commitment due to concerns for his well being.  RN noted pt was coughing with intake = and swallow evaluation was ordered.  Pt states he coughs all the time.  MBS indicatd to allow instrumental eval and determine least restrictive diet.  Subjective: pt awake in chair` Assessment / Plan / Recommendation CHL IP CLINICAL IMPRESSIONS 11/29/2016 Clinical Impression Patient presents with moderate oropharyngeal dysphagia with sensorimotor deficits.  Decreased oral control characterized with lingual rocking - delayed oral transiting and premature spillage noted.  Pharyngeal swallow was timely but decreased tongue base retraction/epiglottic deflection results in vallecular residuals without patient awareness.  Pt did have aspiration of thin via cup before the swallow due to decreased timing of laryngeal closure resulting in cough.  Of note patient did NOT cough before during or after MBS except during aspiration event.  Dry swallow help to decrease vallecular residuals but patient requires cues to conduct them.  Strict aspiration precautions recommended.  Using live images, educated patient to findings/recommendations.  Advised him to strengthen his cough/hock.  SLP Visit Diagnosis Dysphagia, oropharyngeal phase (R13.12) Attention and concentration  deficit following -- Frontal lobe  and executive function deficit following -- Impact on safety and function Moderate aspiration risk   CHL IP TREATMENT RECOMMENDATION 11/29/2016 Treatment Recommendations Therapy as outlined in treatment plan below   Prognosis 11/29/2016 Prognosis for Safe Diet Advancement Guarded Barriers to Reach Goals Severity of deficits Barriers/Prognosis Comment -- CHL IP DIET RECOMMENDATION 11/29/2016 SLP Diet Recommendations Dysphagia 1 (Puree) solids;Nectar thick liquid Liquid Administration via Cup;Straw Medication Administration Crushed with puree Compensations Slow rate;Small sips/bites;Multiple dry swallows after each bite/sip;Follow solids with liquid;Effortful swallow Postural Changes Remain semi-upright after after feeds/meals (Comment);Seated upright at 90 degrees   CHL IP OTHER RECOMMENDATIONS 11/29/2016 Recommended Consults -- Oral Care Recommendations Oral care BID Other Recommendations Order thickener from pharmacy   CHL IP FOLLOW UP RECOMMENDATIONS 11/29/2016 Follow up Recommendations None   CHL IP FREQUENCY AND DURATION 11/29/2016 Speech Therapy Frequency (ACUTE ONLY) min 2x/week Treatment Duration 1 week      CHL IP ORAL PHASE 11/29/2016 Oral Phase Impaired Oral - Pudding Teaspoon -- Oral - Pudding Cup -- Oral - Honey Teaspoon -- Oral - Honey Cup -- Oral - Nectar Teaspoon Weak lingual manipulation;Lingual pumping;Reduced posterior propulsion;Delayed oral transit;Decreased bolus cohesion;Premature spillage Oral - Nectar Cup Weak lingual manipulation;Lingual pumping;Reduced posterior propulsion;Decreased bolus cohesion;Premature spillage Oral - Nectar Straw Weak lingual manipulation;Lingual pumping;Delayed oral transit;Decreased bolus cohesion;Premature spillage Oral - Thin Teaspoon Weak lingual manipulation;Lingual pumping;Delayed oral transit;Decreased bolus cohesion;Premature spillage Oral - Thin Cup Weak lingual manipulation;Lingual pumping;Delayed oral transit;Decreased bolus cohesion;Premature spillage Oral -  Thin Straw -- Oral - Puree Weak lingual manipulation;Lingual pumping;Reduced posterior propulsion;Decreased bolus cohesion;Premature spillage Oral - Mech Soft Weak lingual manipulation;Lingual pumping;Reduced posterior propulsion;Piecemeal swallowing;Delayed oral transit;Decreased bolus cohesion;Premature spillage Oral - Regular -- Oral - Multi-Consistency -- Oral - Pill -- Oral Phase - Comment --  CHL IP PHARYNGEAL PHASE 11/29/2016 Pharyngeal Phase Impaired Pharyngeal- Pudding Teaspoon -- Pharyngeal -- Pharyngeal- Pudding Cup -- Pharyngeal -- Pharyngeal- Honey Teaspoon -- Pharyngeal -- Pharyngeal- Honey Cup -- Pharyngeal -- Pharyngeal- Nectar Teaspoon Reduced epiglottic inversion;Reduced laryngeal elevation;Reduced airway/laryngeal closure;Reduced tongue base retraction;Pharyngeal residue - valleculae Pharyngeal -- Pharyngeal- Nectar Cup Reduced pharyngeal peristalsis;Reduced epiglottic inversion;Reduced tongue base retraction;Pharyngeal residue - valleculae Pharyngeal -- Pharyngeal- Nectar Straw Reduced epiglottic inversion;Reduced anterior laryngeal mobility;Reduced laryngeal elevation;Reduced airway/laryngeal closure;Reduced tongue base retraction;Pharyngeal residue - valleculae Pharyngeal -- Pharyngeal- Thin Teaspoon Reduced epiglottic inversion;Reduced anterior laryngeal mobility;Reduced laryngeal elevation;Reduced airway/laryngeal closure;Reduced tongue base retraction;Pharyngeal residue - valleculae Pharyngeal -- Pharyngeal- Thin Cup Reduced epiglottic inversion;Reduced anterior laryngeal mobility;Reduced laryngeal elevation;Reduced airway/laryngeal closure;Reduced tongue base retraction;Pharyngeal residue - valleculae Pharyngeal -- Pharyngeal- Thin Straw Reduced epiglottic inversion;Reduced anterior laryngeal mobility;Reduced airway/laryngeal closure;Reduced tongue base retraction;Pharyngeal residue - valleculae Pharyngeal -- Pharyngeal- Puree Reduced epiglottic inversion;Reduced anterior laryngeal  mobility;Reduced laryngeal elevation;Reduced airway/laryngeal closure Pharyngeal -- Pharyngeal- Mechanical Soft Reduced epiglottic inversion;Reduced anterior laryngeal mobility;Reduced laryngeal elevation;Reduced tongue base retraction;Pharyngeal residue - valleculae Pharyngeal -- Pharyngeal- Regular -- Pharyngeal -- Pharyngeal- Multi-consistency -- Pharyngeal -- Pharyngeal- Pill -- Pharyngeal -- Pharyngeal Comment pt does not sense residuals, dry swallows helpful - chin tuck and head turn right/left not helpful  CHL IP CERVICAL ESOPHAGEAL PHASE 11/29/2016 Cervical Esophageal Phase Impaired Pudding Teaspoon -- Pudding Cup -- Honey Teaspoon -- Honey Cup -- Nectar Teaspoon -- Nectar Cup -- Nectar Straw -- Thin Teaspoon -- Thin Cup -- Thin Straw -- Puree -- Mechanical Soft -- Regular -- Multi-consistency -- Pill -- Cervical Esophageal Comment slow clearance of barium = without pt awareness, liquids faciliate clearance No flowsheet data found. Macario Golds 11/29/2016, 9:38  AM  Luanna Salk, MS [facility] SLP [phone]               Scheduled Meds: . feeding supplement (ENSURE ENLIVE)  237 mL Oral BID BM  . ferrous sulfate  325 mg Oral TID WC  . ferrous sulfate  325 mg Oral TID WC  . folic acid  1 mg Oral Daily  . furosemide  40 mg Intravenous Once  . Gerhardt's butt cream  1 application Topical BID  . ipratropium-albuterol  3 mL Nebulization BID  . metoprolol succinate  12.5 mg Oral Daily  . multivitamin with minerals  1 tablet Oral Daily  . nicotine  21 mg Transdermal Daily  . polyethylene glycol  17 g Oral Daily  . thiamine  100 mg Intramuscular Once  . thiamine  100 mg Oral Daily   Continuous Infusions:   LOS: 4 days    Time spent: 30 min    Janece Canterbury, MD [facility] Pager [phone]  If 7PM-7AM, please contact night-coverage [email or web address] Password [facility] 11/29/2016, 3:40 PM

## 2016-11-29 NOTE — Progress Notes (Signed)
Pt/family has selected [facility] for rehab and facility informs CSW that [facility] prior authorization has been obtained. CSW updated pt's sister and will follow to assist with transition to SNF at DC.  Sharren Bridge, MSW, LCSW Clinical Social Work 11/29/2016 [phone]

## 2016-11-29 NOTE — Progress Notes (Signed)
  Echocardiogram 2D Echocardiogram has been performed.  Berit Raczkowski T Kenitha Glendinning 11/29/2016, 2:23 PM

## 2016-11-29 NOTE — Progress Notes (Signed)
  Echocardiogram 2D Echocardiogram has been performed.  Chavis Tessler T Gearldene Fiorenza 11/29/2016, 2:22 PM

## 2016-11-29 NOTE — Progress Notes (Signed)
Physical Therapy Treatment Patient Details Name: Patrick [name] MRN: [id number] DOB: 10/02/1950 Today's Date: 11/29/2016    History of Present Illness Patrick [name] is a 66 y.o. male with medical history significant of alcohol abuse, depression, and GERD; who was  involuntary committed by family members for inability to care for herself.  He had reportedly been sitting in the floor for 3 days .    PT Comments    Pt reluctantly worked with PT, but showed improved mobility, although unsteady.  Pt used EVA walker for transfer from bed > recliner.  Con't to recommend SNF.   Follow Up Recommendations  SNF;Supervision/Assistance - 24 hour     Equipment Recommendations  None recommended by PT    Recommendations for Other Services       Precautions / Restrictions Precautions Precautions: Fall Restrictions Weight Bearing Restrictions: No    Mobility  Bed Mobility Overal bed mobility: Needs Assistance Bed Mobility: Supine to Sit     Supine to sit: Min assist     General bed mobility comments: extra time to, assist with the legs to bed edge, assist with trunk and to scoot forward.  Transfers Overall transfer level: Needs assistance Equipment used:  (EVA walker) Transfers: Sit to/from [facility] Sit to Stand: Min assist;+2 physical assistance;+2 safety/equipment Stand pivot transfers: Mod assist;+2 physical assistance;+2 safety/equipment       General transfer comment: Pt unable to follow cues for proper hand placement.  Used EVA walker and feet blocked upon standing.  Pt shaky with SPT and pt reports he is fearful of falling.  Ambulation/Gait             General Gait Details: Pt declined due to fear of falling   Stairs            Wheelchair Mobility    Modified Rankin (Stroke Patients Only)       Balance   Sitting-balance support: Feet supported;Bilateral upper extremity supported Sitting balance-Leahy Scale: Fair     Standing  balance support: Bilateral upper extremity supported Standing balance-Leahy Scale: Poor                              Cognition Arousal/Alertness: Awake/alert Behavior During Therapy: Flat affect Overall Cognitive Status: Impaired/Different from baseline Area of Impairment: Awareness;Safety/judgement;Problem solving                         Safety/Judgement: Decreased awareness of safety Awareness: Emergent Problem Solving: Slow processing;Decreased initiation;Difficulty sequencing;Requires verbal cues;Requires tactile cues        Exercises      General Comments        Pertinent Vitals/Pain Pain Assessment: No/denies pain Faces Pain Scale: Hurts a little bit Pain Location: buttocks Pain Descriptors / Indicators: Aching Pain Intervention(s): Limited activity within patient's tolerance    Home Living                      Prior Function            PT Goals (current goals can now be found in the care plan section) Acute Rehab PT Goals Patient Stated Goal: "I'm too weak to get up." PT Goal Formulation: With patient Time For Goal Achievement: 12/11/16 Potential to Achieve Goals: Fair Progress towards PT goals: Progressing toward goals    Frequency    Min 2X/week      PT Plan Current  plan remains appropriate    Co-evaluation              AM-PAC PT "6 Clicks" Daily Activity  Outcome Measure  Difficulty turning over in bed (including adjusting bedclothes, sheets and blankets)?: Unable Difficulty moving from lying on back to sitting on the side of the bed? : Unable Difficulty sitting down on and standing up from a chair with arms (e.g., wheelchair, bedside commode, etc,.)?: Unable Help needed moving to and from a bed to chair (including a wheelchair)?: A Lot Help needed walking in hospital room?: A Lot Help needed climbing 3-5 steps with a railing? : Total 6 Click Score: 8    End of Session Equipment Utilized During  Treatment: Gait belt Activity Tolerance: Patient tolerated treatment well Patient left: in chair;with call bell/phone within reach;with nursing/sitter in room Nurse Communication: Mobility status PT Visit Diagnosis: Difficulty in walking, not elsewhere classified (R26.2);History of falling (Z91.81);Adult, failure to thrive (R62.7)     Time: 4210-3128 PT Time Calculation (min) (ACUTE ONLY): 15 min  Charges:  $Therapeutic Activity: 8-22 mins                    G Codes:       [name], [place] Pager [phone] 11/29/2016    [name] 11/29/2016, 12:23 PM

## 2016-11-29 NOTE — Progress Notes (Signed)
Modified Barium Swallow Progress Note  Patient Details  Name: Patrick [name] MRN: [id number] Date of Birth: 07-31-1950  Today's Date: 11/29/2016  Modified Barium Swallow completed.  Full report located under Chart Review in the Imaging Section.  Brief recommendations include the following:  Clinical Impression  Patient presents with moderate oropharyngeal dysphagia with sensorimotor deficits.  Decreased oral control characterized with lingual rocking - delayed oral transiting and premature spillage noted.  Pharyngeal swallow was timely but decreased tongue base retraction/epiglottic deflection results in vallecular residuals without patient awareness.  Pt did have aspiration of thin via cup before the swallow due to decreased timing of laryngeal closure resulting in cough.  Of note patient did NOT cough before during or after MBS except during aspiration event.  Dry swallow help to decrease vallecular residuals but patient requires cues to conduct them.  Strict aspiration precautions recommended.  Using live images, educated patient to findings/recommendations.  Advised him to strengthen his cough/hock.    Swallow Evaluation Recommendations       SLP Diet Recommendations: Dysphagia 1 (Puree) solids;Nectar thick liquid   Liquid Administration via: Cup;Straw   Medication Administration: Crushed with puree   Supervision: Full assist for feeding   Compensations: Slow rate;Small sips/bites;Multiple dry swallows after each bite/sip;Follow solids with liquid;Effortful swallow   Postural Changes: Remain semi-upright after after feeds/meals (Comment);Seated upright at 90 degrees   Oral Care Recommendations: Oral care BID   Other Recommendations: Order thickener from [place], [facility] SLP [phone]  Patrick [name] 11/29/2016,9:37 AM

## 2016-11-30 DIAGNOSIS — R5381 Other malaise: Secondary | ICD-10-CM | POA: Diagnosis not present

## 2016-11-30 DIAGNOSIS — G9341 Metabolic encephalopathy: Secondary | ICD-10-CM | POA: Diagnosis not present

## 2016-11-30 DIAGNOSIS — L89152 Pressure ulcer of sacral region, stage 2: Secondary | ICD-10-CM | POA: Diagnosis not present

## 2016-11-30 DIAGNOSIS — R1310 Dysphagia, unspecified: Secondary | ICD-10-CM | POA: Diagnosis not present

## 2016-11-30 DIAGNOSIS — F1010 Alcohol abuse, uncomplicated: Secondary | ICD-10-CM | POA: Diagnosis not present

## 2016-11-30 DIAGNOSIS — R5383 Other fatigue: Secondary | ICD-10-CM

## 2016-11-30 DIAGNOSIS — R933 Abnormal findings on diagnostic imaging of other parts of digestive tract: Secondary | ICD-10-CM | POA: Diagnosis not present

## 2016-11-30 DIAGNOSIS — D649 Anemia, unspecified: Secondary | ICD-10-CM | POA: Diagnosis not present

## 2016-11-30 DIAGNOSIS — R1312 Dysphagia, oropharyngeal phase: Secondary | ICD-10-CM

## 2016-11-30 LAB — CBC
HCT: 25.1 % — ABNORMAL LOW (ref 39.0–52.0)
HEMOGLOBIN: 8.5 g/dL — AB (ref 13.0–17.0)
MCH: 32.4 pg (ref 26.0–34.0)
MCHC: 33.9 g/dL (ref 30.0–36.0)
MCV: 95.8 fL (ref 78.0–100.0)
PLATELETS: 240 10*3/uL (ref 150–400)
RBC: 2.62 MIL/uL — AB (ref 4.22–5.81)
RDW: 16 % — ABNORMAL HIGH (ref 11.5–15.5)
WBC: 7.6 10*3/uL (ref 4.0–10.5)

## 2016-11-30 LAB — BASIC METABOLIC PANEL
ANION GAP: 11 (ref 5–15)
BUN: 13 mg/dL (ref 6–20)
CALCIUM: 7.6 mg/dL — AB (ref 8.9–10.3)
CHLORIDE: 101 mmol/L (ref 101–111)
CO2: 27 mmol/L (ref 22–32)
CREATININE: 0.87 mg/dL (ref 0.61–1.24)
GFR calc non Af Amer: >60 mL/min (ref >60)
Glucose, Bld: 104 mg/dL — ABNORMAL HIGH (ref 65–99)
Potassium: 3.4 mmol/L — ABNORMAL LOW (ref 3.5–5.1)
SODIUM: 139 mmol/L (ref 135–145)

## 2016-11-30 MED ORDER — PANTOPRAZOLE SODIUM 40 MG PO TBEC: 40.0000 mg | DELAYED_RELEASE_TABLET | Freq: Every day | ORAL | Status: DC

## 2016-11-30 MED ORDER — IPRATROPIUM-ALBUTEROL 0.5-2.5 (3) MG/3ML IN SOLN
3.0000 mL | Freq: Three times a day (TID) | RESPIRATORY_TRACT | Status: DC
  Filled 2016-11-30: qty 3

## 2016-11-30 MED ORDER — PANTOPRAZOLE SODIUM 40 MG PO TBEC
40.0000 mg | DELAYED_RELEASE_TABLET | Freq: Two times a day (BID) | ORAL | Status: DC
  Administered 2016-11-30 – 2016-12-01 (×2): 40 mg via ORAL
  Filled 2016-11-30 (×3): qty 1

## 2016-11-30 MED ORDER — FAMOTIDINE IN NACL 20-0.9 MG/50ML-% IV SOLN: 20.0000 mg | Freq: Two times a day (BID) | INTRAVENOUS | Status: DC

## 2016-11-30 MED ORDER — SODIUM CHLORIDE 0.9 % IV BOLUS (SEPSIS)
250.0000 mL | Freq: Once | INTRAVENOUS | Status: AC
  Administered 2016-11-30: 250 mL via INTRAVENOUS

## 2016-11-30 MED ORDER — POTASSIUM CHLORIDE CRYS ER 20 MEQ PO TBCR
40.0000 meq | EXTENDED_RELEASE_TABLET | Freq: Once | ORAL | Status: AC
  Administered 2016-11-30: 40 meq via ORAL
  Filled 2016-11-30: qty 2

## 2016-11-30 NOTE — Consult Note (Signed)
Referring Provider: Dr. Sheran Fava, [facility] Primary Care Physician:  [name], MD Primary Gastroenterologist:  Dr. Fuller Plan  Reason for Consultation:  Dysphagia, abnormal esophagus on CT   HPI: Patrick [name] is a 66 y.o. male with medical history significant of alcohol abuse, depression, and GERD; who was reportedly involuntary committed by family members for inability to care for himself. Patient is a poor historian. He had reportedly been sitting in the floor for 3 days in his own feces and urine as he was too weak to get up and move.  Being treated for several issues here and is going to SNF upon discharge.  Failed swallow evaluation with moderate oropharyngeal dysphagia and considered high aspiration risk.  CT of the chest incidental found patulous fluid-filled esophagus with moderate hiatal hernia.  EGD about 2 years ago showed severe esophagitis.  GI being consulted for input regarding this abnormal imaging.  Last EGD in 01/2015:  Grade C (severe) esophagitis and a hiatal hernia (5 cm)--biopsies normal.  Patient has poor insight regarding his issues.  Denies any issues with swallowing or coughing with eating/drinking.  Poor historian and was not able to provide any useful/meaningful information/history.   Past Medical History:  Diagnosis Date  . Colon polyps   . Depression   . Diverticulitis   . Diverticulosis of colon   . GERD (gastroesophageal reflux disease)   . Kidney stone     Past Surgical History:  Procedure Laterality Date  . COLONOSCOPY    . UPPER GASTROINTESTINAL ENDOSCOPY      Prior to Admission medications   Medication Sig Start Date End Date Taking? Authorizing Provider  albuterol (VENTOLIN HFA) 108 (90 BASE) MCG/ACT inhaler Inhale 2 puffs into the lungs every 4 (four) hours as needed for wheezing or shortness of breath. 08/17/14  Yes Burchette, Alinda Sierras, MD  amLODipine (NORVASC) 5 MG tablet TAKE 1 TABLET BY MOUTH DAILY. 10/18/16  Yes Burchette, Alinda Sierras, MD   losartan (COZAAR) 100 MG tablet TAKE 1 TABLET BY MOUTH DAILY. 10/18/16  Yes Burchette, Alinda Sierras, MD  metoprolol succinate (TOPROL-XL) 50 MG 24 hr tablet TAKE ONE TABLET BY MOUTH ONE TIME DAILY WITH OR IMMEDIATELY FOLLOWING A MEAL 07/12/16  Yes Burchette, Alinda Sierras, MD  pantoprazole (PROTONIX) 40 MG tablet TAKE 1 TABLET (40 MG TOTAL) BY MOUTH DAILY. **NEED OFFICE VISIT FOR MORE REFILLS** 09/07/16  Yes Burchette, Alinda Sierras, MD  polyethylene glycol powder (GLYCOLAX/MIRALAX) powder Take 17 grams in 8 oz of water or juice daily. Patient taking differently: Take 1 Container by mouth daily as needed for mild constipation or moderate constipation. Take 17 grams in 8 oz of water or juice 02/23/15  Yes Zehr, Janett Billow D, PA-C  triamcinolone cream (KENALOG) 0.1 % Apply 1 application topically 2 (two) times daily as needed. Patient taking differently: Apply 1 application topically 2 (two) times daily as needed (rash).  03/17/15  Yes Burchette, Alinda Sierras, MD  Omeprazole-Sodium Bicarbonate (ZEGERID) 20-1100 MG CAPS capsule Take 1 capsule by mouth at bedtime. Patient not taking: Reported on 11/25/2016 01/15/15   Gatha Mayer, MD    Current Facility-Administered Medications  Medication Dose Route Frequency Provider Last Rate Last Dose  . acetaminophen (TYLENOL) tablet 650 mg  650 mg Oral Q6H PRN Fuller Plan A, MD       Or  . acetaminophen (TYLENOL) suppository 650 mg  650 mg Rectal Q6H PRN Fuller Plan A, MD      . feeding supplement (ENSURE ENLIVE) (ENSURE ENLIVE) liquid  237 mL  237 mL Oral BID BM Charlynne Cousins, MD   Stopped at 11/28/16 1000  . ferrous sulfate tablet 325 mg  325 mg Oral TID WC Charlynne Cousins, MD   325 mg at 11/30/16 4431  . folic acid (FOLVITE) tablet 1 mg  1 mg Oral Daily Charlynne Cousins, MD   1 mg at 11/30/16 1039  . Gerhardt's butt cream 1 application  1 application Topical BID Janece Canterbury, MD   1 application at 54/00/86 1040  . HYDROcodone-acetaminophen (NORCO/VICODIN)  5-325 MG per tablet 1-2 tablet  1-2 tablet Oral Q6H PRN Opyd, Ilene Qua, MD   2 tablet at 11/30/16 0234  . ipratropium-albuterol (DUONEB) 0.5-2.5 (3) MG/3ML nebulizer solution 3 mL  3 mL Nebulization Q4H PRN Fuller Plan A, MD   3 mL at 11/26/16 2311  . metoprolol succinate (TOPROL-XL) 24 hr tablet 12.5 mg  12.5 mg Oral Daily Charlynne Cousins, MD   12.5 mg at 11/30/16 1039  . multivitamin with minerals tablet 1 tablet  1 tablet Oral Daily Charlynne Cousins, MD   1 tablet at 11/30/16 1039  . nicotine (NICODERM CQ - dosed in mg/24 hours) patch 21 mg  21 mg Transdermal Daily Antonietta Breach, PA-C   21 mg at 11/30/16 1040  . ondansetron (ZOFRAN) tablet 4 mg  4 mg Oral Q6H PRN Fuller Plan A, MD   4 mg at 11/25/16 2203   Or  . ondansetron (ZOFRAN) injection 4 mg  4 mg Intravenous Q6H PRN Fuller Plan A, MD      . polyethylene glycol (MIRALAX / GLYCOLAX) packet 17 g  17 g Oral Daily Charlynne Cousins, MD   17 g at 11/30/16 1039  . [place]   Oral PRN Charlynne Cousins, MD      . thiamine (B-1) injection 100 mg  100 mg Intramuscular Once Aileen Fass, Tammi Klippel, MD      . thiamine (VITAMIN B-1) tablet 100 mg  100 mg Oral Daily Charlynne Cousins, MD   100 mg at 11/30/16 1039    Allergies as of 11/25/2016  . (No Known Allergies)    Family History  Problem Relation Age of Onset  . Heart disease Father 37       cabg    Social History   Social History  . Marital status: Divorced    Spouse name: N/A  . Number of children: N/A  . Years of education: N/A   Occupational History  . Not on file.   Social History Main Topics  . Smoking status: Current Every Day Smoker    Packs/day: 1.00    Types: Cigarettes  . Smokeless tobacco: Never Used  . Alcohol use 8.4 oz/week    14 Glasses of wine per week  . Drug use: No  . Sexual activity: Not on file   Other Topics Concern  . Not on file   Social History Narrative  . No narrative on file    Review of  Systems: ROS is O/W negative except as mentioned in HPI.  Physical Exam: Vital signs in last 24 hours: Temp:  [97.5 F (36.4 C)-98.1 F (36.7 C)] 98.1 F (36.7 C) (08/23 0728) Pulse Rate:  [96-109] 108 (08/23 0728) Resp:  [18-21] 18 (08/23 0728) BP: (85-123)/(57-90) 105/62 (08/23 0728) SpO2:  [95 %-97 %] 96 % (08/23 0728) Last BM Date: 11/28/16 General:  Alert, disheveled, appears older than stated age. Head:  Normocephalic and atraumatic. Eyes:  Sclera  clear, no icterus.  Conjunctiva pink. Ears:  Normal auditory acuity.  Mouth:  No deformity or lesions.   Lungs:  Wheezing noted B/L.  No increased WOB. Heart:  Mild tachycardia.  No M/R/G. Abdomen:  Soft, non-distended.  BS present.  Non-tender. Rectal:  Deferred  Msk:  Symmetrical without gross deformities. Pulses:  Normal pulses noted. Extremities:  Without clubbing or edema. Neurologic:  Alert.  Grossly normal neurologically. Skin:  Intact without significant lesions or rashes.   Lab Results:  Recent Labs  11/30/16 0436  WBC 7.6  HGB 8.5*  HCT 25.1*  PLT 240   BMET  Recent Labs  11/28/16 0434 11/29/16 0441 11/30/16 0436  NA 142 140 139  K 4.4 3.8 3.4*  CL 108 104 101  CO2 26 26 27   GLUCOSE 109* 97 104*  BUN 15 14 13   CREATININE 0.93 0.87 0.87  CALCIUM 6.9* 7.0* 7.6*   LFT  Recent Labs  11/29/16 0441  ALBUMIN 2.7*   IMPRESSION:  *66 year old male who was involuntary commited by family for inability to care for himself.  Being treated for several issues including metabolic encephalopathy. -Dysphagia:  Failed swallow evaluation with moderate oropharyngeal dysphagia and considered high aspiration risk.  CT of the chest incidental found patulous fluid-filled esophagus with moderate hiatal hernia.  EGD about 2 years ago showed severe esophagitis.  PLAN: -Patient has poor insight regarding his issues.  Denies any issues with swallowing or coughing with eating/drinking.  CT scan findings probably just  represent reflux.  Likely had not been taking PPI at home.  Should be on daily PPI.  I will start pantoprazole 40 mg daily.   -I doubt any further procedures or tests are needed at this time, but final recommendations per Dr. Ardis Hughs.   ZEHR, JESSICA D.  11/30/2016, 12:27 PM  Pager number [phone]   ________________________________________________________________________  Velora Heckler GI MD note:  I personally examined the patient, reviewed the data and agree with the assessment and plan described above.  He is unable to give any reliable history.  He was found down recently, brought to hospital. I've been asked to see him after he failed a speech evaluation (oropharyngeal dysphagia, high risk for aspiration) and CT scan of chest showed hiatal hernia with 'patulous' esophagus that appears to have fluid in it.  Probably the fluid filled esophagus represents reflux, indeed he had severe reflux esophagitis noted 2 years ago by EGD.  He coughs when he eats, drinks which is probably related to his oropharyngeal dysphagia.  Reflux could contribute. I recommend twice daily PPI, strict reflux precautions (sitting upright during meal and afterward for at least 1 hour). There is a note from speech that mentions an upcoming palliative care consult and I think that is very reasonable.  His mental status (etoh related dementia?, inability to care for himself) predominates his clinical issues.  Will sign off for now.  Please call or page with any further questions or concerns.    Owens Loffler, MD [facility] Pager [phone]

## 2016-11-30 NOTE — Progress Notes (Addendum)
PROGRESS NOTE  [name]  KVQ:[id number] DOB: [birth year] DOA: 11/25/2016 PCP: [name], MD  Brief Narrative:   [name] is an 66 y.o. male past medical history of alcohol abuse depression was involuntarily committed by family members her inability to care for himself patient is a poor historian and he is encephalopathic has been sitting at home for 3 days and his face use any urine.  Assessment & Plan:   Principal Problem:   Symptomatic anemia Active Problems:   Thrombocytopenia (HCC)   Hypokalemia   Pressure ulcer of sacral region, stage 2   Alcohol abuse   Hypotension   Hypomagnesemia   Hyponatremia   Non-traumatic rhabdomyolysis  Symptomatic anemia, occult negative.  Likely due to chronic inflammation with elevated ferritin.  Folate, b12, and TSH wnl.   Hbg on January of last year was 12.  Negative FOBT's. Has been transfuse 1 unit of packed red blood cells. Hemoglobin stable at 8.3 Ferrous sulfate daily.  Severe dysphagia without improvement since admission -  Patient high risk for aspiration.  Discussed possibility of feeding tube with sister who stated she feels he would not be a good candidate for a feeding tube due to poor insight and poor likelihood he would adhere to NPO status for safety.   -  Continue dysphagia diet for comfort -  Palliative care consultation to discuss Code status in more depth and confirm feeding tube decision  Metabolic encephalopathy, still confused and unable to provide a meaningful history.  Ammonia level wnl Psychiatry was consulted who deemed the patient not have capacity to make medical decisions. Agree that patient still does not have capacity to make his own medical decisions We have consulted social worker to help with healthcare power of attorney. Physical therapy evaluated the patient recommended skilled nursing facility. D/c sitter D/c IVC  Sinus tachycardia, likely related to alcohol withdrawal and  deconditioning, HR trending down with metoprolol -  TSH 1.6 wnl -  Cortisol 8.4 -  D-dimer:  Positive -  CT angio chest:  Negative for PE -   ECHO:  Preserved EF  Fluid-filled esophagus/patulous esophagus -  GI:  GERD and not recommending any imaging or procedures at this time -  PPI   Dyspnea and wheezing, likely due to aspiration, somewhat improved this morning -  CXR:  Possible infiltrate vs. Edema in the RUE.  I suspect this is partly due to volume overload from IVF and patient may have underlying cirrhosis.  He is afebrile -  Continue duonebs -  procalcitonin 0.32  Acute kidney injury due to ARB and dehydration, resolved  Mild rhabdomyolysis, Resolved with IV fluid hydration.  EtOH abuse:  Continue thiamine and folate Continue CIWA protocol.  Electrolyte abnormalities: Hyponatremia, hypokalemia, hypomagnesemia, hypophosphatemia:  electrolyte abnormalities have resolved.  Essential hypertension:  continue to hold antiepileptic medication.  transaminitis: With a 2:1 ratio AST 2 ALT likely due to alcohol.  Stage II pressure ulcer plus irritant dermatitis from sitting in stool for several days Wound care.  Thrombocytopenia: Likely due to alcohol abuse.  Moderate protein caloric malnutrition. - Ensure Enlive  DVT prophylaxis:  SCDs Code Status:  none Family Communication:  Patient and his sister by phone Disposition Plan:  SNF tomorrow with palliative care to follow  Consultants:   Psychiatry  Palliative care  Procedures:  none  Antimicrobials:  Anti-infectives    None       Subjective:  Bottom hurts from rash.  Denies cough, SOB, wheezing, globus  sensation, heart burn, regurgitation of food, sensation taht food or drink gets stuck going down  Objective: Vitals:   11/29/16 2100 11/30/16 0452 11/30/16 0614 11/30/16 0728  BP:  (!) 89/57 (!) 85/60 105/62  Pulse:  96 99 (!) 108  Resp: (!) 21 19  18   Temp: (!) 97.5 F (36.4 C) 98.1 F (36.7  C)  98.1 F (36.7 C)  TempSrc: Oral Oral  Oral  SpO2: 97% 95%  96%  Weight:      Height:        Intake/Output Summary (Last 24 hours) at 11/30/16 1730 Last data filed at 11/30/16 1342  Gross per 24 hour  Intake              400 ml  Output             1775 ml  Net            -1375 ml   Filed Weights   11/25/16 0104  Weight: 65.8 kg (145 lb)    Examination:  General exam:  Adult male, no acute distress HEENT:  NCAT, MMM Respiratory system:   Decreased wheezing, no rales or rhonchi Cardiovascular system: Regular rate and rhythm, normal S1/S2. No murmurs, rubs, gallops or clicks.  Warm extremities Gastrointestinal system: Normal active bowel sounds, soft, mildly distended, nontender. MSK:  Normal tone and bulk, no bilateral lower extremity edema Neuro:  Grossly moves all extremities    Data Reviewed: I have personally reviewed following labs and imaging studies  CBC:  Recent Labs Lab 11/25/16 0210 11/25/16 1453 11/27/16 1015 11/30/16 0436  WBC 7.1 3.5* 6.0 7.6  NEUTROABS 4.5  --   --   --   HGB 7.0* 8.3* 8.3* 8.5*  HCT 18.8* 22.8* 23.2* 25.1*  MCV 92.2 92.3 95.1 95.8  PLT 113* 111* 182 973   Basic Metabolic Panel:  Recent Labs Lab 11/25/16 0340 11/25/16 1453 11/25/16 1833 11/26/16 0343 11/27/16 0435 11/28/16 0434 11/29/16 0441 11/30/16 0436  NA  --  133*  --  136 141 142 140 139  K  --  3.5  --  4.1 4.4 4.4 3.8 3.4*  CL  --  104  --  109 111 108 104 101  CO2  --  17*  --  19* 21* 26 26 27   GLUCOSE  --  269*  --  148* 75 109* 97 104*  BUN  --  32*  --  30* 16 15 14 13   CREATININE  --  1.68*  --  1.29* 1.00 0.93 0.87 0.87  CALCIUM  --  6.9*  --  7.2* 7.1* 6.9* 7.0* 7.6*  MG 0.5*  --  1.5*  --   --   --   --   --   PHOS  --  1.9*  1.9*  --  1.2* 4.1 5.6* 5.1*  --    GFR: Estimated Creatinine Clearance: 80.9 mL/min (by C-G formula based on SCr of 0.87 mg/dL). Liver Function Tests:  Recent Labs Lab 11/25/16 0210 11/25/16 1453 11/26/16 0343  11/27/16 0435 11/28/16 0434 11/29/16 0441  AST 71*  --   --   --   --   --   ALT 19  --   --   --   --   --   ALKPHOS 45  --   --   --   --   --   BILITOT 1.6*  --   --   --   --   --  PROT 6.7  --   --   --   --   --   ALBUMIN 3.4* 3.0* 2.8* 2.7* 2.8* 2.7*   No results for input(s): LIPASE, AMYLASE in the last 168 hours.  Recent Labs Lab 11/25/16 0335  AMMONIA 19   Coagulation Profile: No results for input(s): INR, PROTIME in the last 168 hours. Cardiac Enzymes:  Recent Labs Lab 11/25/16 0340 11/26/16 1324  CKTOTAL 1,131* 381   BNP (last 3 results) No results for input(s): PROBNP in the last 8760 hours. HbA1C: No results for input(s): HGBA1C in the last 72 hours. CBG:  Recent Labs Lab 11/27/16 2206 11/28/16 0727 11/28/16 1139 11/28/16 1803 11/28/16 2130  GLUCAP 140* 85 107* 100* 101*   Lipid Profile: No results for input(s): CHOL, HDL, LDLCALC, TRIG, CHOLHDL, LDLDIRECT in the last 72 hours. Thyroid Function Tests:  Recent Labs  11/28/16 1208  TSH 1.642   Anemia Panel: No results for input(s): VITAMINB12, FOLATE, FERRITIN, TIBC, IRON, RETICCTPCT in the last 72 hours. Urine analysis:    Component Value Date/Time   COLORURINE yellow 02/21/2010 1055   APPEARANCEUR Clear 02/21/2010 1055   LABSPEC 1.025 02/21/2010 1055   PHURINE 5.0 02/21/2010 1055   HGBUR negative 02/21/2010 1055   BILIRUBINUR n 03/11/2014 1025   PROTEINUR n 03/11/2014 1025   UROBILINOGEN 0.2 03/11/2014 1025   UROBILINOGEN 0.2 02/21/2010 1055   NITRITE n 03/11/2014 1025   NITRITE negative 02/21/2010 1055   LEUKOCYTESUR Negative 03/11/2014 1025   Sepsis Labs: @LABRCNTIP (procalcitonin:4,lacticidven:4)  ) Recent Results (from the past 240 hour(s))  MRSA PCR Screening     Status: None   Collection Time: 11/25/16  6:24 AM  Result Value Ref Range Status   MRSA by PCR NEGATIVE NEGATIVE Final    Comment:        The GeneXpert MRSA Assay (FDA approved for NASAL specimens only), is  one component of a comprehensive MRSA colonization surveillance program. It is not intended to diagnose MRSA infection nor to guide or monitor treatment for MRSA infections.       Radiology Studies: Ct Angio Chest Pe W Or Wo Contrast  Result Date: 11/29/2016 CLINICAL DATA:  Cough, shortness of breath. Suspect pulmonary embolism. History of smoking, alcohol abuse and erosive esophagitis. EXAM: CT ANGIOGRAPHY CHEST WITH CONTRAST TECHNIQUE: Multidetector CT imaging of the chest was performed using the standard protocol during bolus administration of intravenous contrast. Multiplanar CT image reconstructions and MIPs were obtained to evaluate the vascular anatomy. CONTRAST:  170 cc Isovue 370 COMPARISON:  Chest radiograph November 29, 2016 at 1052 hours FINDINGS: Patient was coughing during examination resulting in moderate motion artifact. Examination with re-attempted without significant image quality improvement. CARDIOVASCULAR: Suboptimal contrast opacification of the pulmonary artery's (200 Hounsfield units, target is 250 Hounsfield units. Main pulmonary artery is not enlarged. No pulmonary arterial filling defects to the level of the segmental branches. Heart size is normal, no right heart strain. No pericardial effusion. Thoracic aorta is normal course and caliber, mild calcific atherosclerosis. MEDIASTINUM/NODES: No lymphadenopathy by CT size criteria. Patulous fluid-filled esophagus. LUNGS/PLEURA: Tracheobronchial tree is patent, no pneumothorax. Mild centrilobular emphysema. Apical bullous changes. Strandy densities RIGHT lower lobe. UPPER ABDOMEN: Included view of the abdomen is nonacute. Moderate hiatal hernia. Hepatic steatosis. MUSCULOSKELETAL: Visualized soft tissues and included osseous structures appear nonacute. Old posterior RIGHT rib fractures. Moderate included cervical spondylosis. Review of the MIP images confirms the above findings. IMPRESSION: 1. No acute pulmonary embolism on this  moderately motion degraded examination. 2.  RIGHT lower lobe atelectasis.  Mild centrilobular emphysema. 3. Patulous fluid-filled esophagus with moderate hiatal hernia, aspiration risk. Aortic Atherosclerosis (ICD10-I70.0) and Emphysema (ICD10-J43.9). Electronically Signed   By: Elon Alas M.D.   On: 11/29/2016 19:43   Dg Chest Port 1 View  Result Date: 11/29/2016 CLINICAL DATA:  Wheezing EXAM: PORTABLE CHEST 1 VIEW COMPARISON:  11/25/2016 FINDINGS: Heart size upper limits of normal. Aortic atherosclerosis. Hiatal hernia present. Question hazy infiltrate right lower lobe. The remainder the chest is clear. No effusions. No significant bone finding. IMPRESSION: Question AC pneumonia right lower lobe. Aortic atherosclerosis. Hiatal hernia. Electronically Signed   By: Nelson Chimes M.D.   On: 11/29/2016 11:18   Dg Swallowing Func-speech Pathology  Result Date: 11/29/2016 Objective Swallowing Evaluation: Type of Study: MBS-Modified Barium Swallow Study Patient Details Name: RISHAB STOUDT MRN: [id number] Date of Birth: 1950-08-06 Today's Date: 11/29/2016 Time: SLP Start Time (ACUTE ONLY): 0845-SLP Stop Time (ACUTE ONLY): 0915 SLP Time Calculation (min) (ACUTE ONLY): 30 min Past Medical History: Past Medical History: Diagnosis Date . Colon polyps  . Depression  . Diverticulitis  . Diverticulosis of colon  . GERD (gastroesophageal reflux disease)  . Kidney stone  Past Surgical History: Past Surgical History: Procedure Laterality Date . COLONOSCOPY   . UPPER GASTROINTESTINAL ENDOSCOPY   HPI: 66 yo male adm to [facility]  - found at home where he had been sitting for multiple days in his urine.  Pt has h/o etoh use, GERD and is for involuntary commitment due to concerns for his well being.  RN noted pt was coughing with intake = and swallow evaluation was ordered.  Pt states he coughs all the time.  MBS indicatd to allow instrumental eval and determine least restrictive diet.  Subjective: pt awake in chair` Assessment /  Plan / Recommendation CHL IP CLINICAL IMPRESSIONS 11/29/2016 Clinical Impression Patient presents with moderate oropharyngeal dysphagia with sensorimotor deficits.  Decreased oral control characterized with lingual rocking - delayed oral transiting and premature spillage noted.  Pharyngeal swallow was timely but decreased tongue base retraction/epiglottic deflection results in vallecular residuals without patient awareness.  Pt did have aspiration of thin via cup before the swallow due to decreased timing of laryngeal closure resulting in cough.  Of note patient did NOT cough before during or after MBS except during aspiration event.  Dry swallow help to decrease vallecular residuals but patient requires cues to conduct them.  Strict aspiration precautions recommended.  Using live images, educated patient to findings/recommendations.  Advised him to strengthen his cough/hock.  SLP Visit Diagnosis Dysphagia, oropharyngeal phase (R13.12) Attention and concentration deficit following -- Frontal lobe and executive function deficit following -- Impact on safety and function Moderate aspiration risk   CHL IP TREATMENT RECOMMENDATION 11/29/2016 Treatment Recommendations Therapy as outlined in treatment plan below   Prognosis 11/29/2016 Prognosis for Safe Diet Advancement Guarded Barriers to Reach Goals Severity of deficits Barriers/Prognosis Comment -- CHL IP DIET RECOMMENDATION 11/29/2016 SLP Diet Recommendations Dysphagia 1 (Puree) solids;Nectar thick liquid Liquid Administration via Cup;Straw Medication Administration Crushed with puree Compensations Slow rate;Small sips/bites;Multiple dry swallows after each bite/sip;Follow solids with liquid;Effortful swallow Postural Changes Remain semi-upright after after feeds/meals (Comment);Seated upright at 90 degrees   CHL IP OTHER RECOMMENDATIONS 11/29/2016 Recommended Consults -- Oral Care Recommendations Oral care BID Other Recommendations Order thickener from pharmacy   CHL IP  FOLLOW UP RECOMMENDATIONS 11/29/2016 Follow up Recommendations None   CHL IP FREQUENCY AND DURATION 11/29/2016 Speech Therapy Frequency (ACUTE ONLY) min 2x/week Treatment Duration  1 week      CHL IP ORAL PHASE 11/29/2016 Oral Phase Impaired Oral - Pudding Teaspoon -- Oral - Pudding Cup -- Oral - Honey Teaspoon -- Oral - Honey Cup -- Oral - Nectar Teaspoon Weak lingual manipulation;Lingual pumping;Reduced posterior propulsion;Delayed oral transit;Decreased bolus cohesion;Premature spillage Oral - Nectar Cup Weak lingual manipulation;Lingual pumping;Reduced posterior propulsion;Decreased bolus cohesion;Premature spillage Oral - Nectar Straw Weak lingual manipulation;Lingual pumping;Delayed oral transit;Decreased bolus cohesion;Premature spillage Oral - Thin Teaspoon Weak lingual manipulation;Lingual pumping;Delayed oral transit;Decreased bolus cohesion;Premature spillage Oral - Thin Cup Weak lingual manipulation;Lingual pumping;Delayed oral transit;Decreased bolus cohesion;Premature spillage Oral - Thin Straw -- Oral - Puree Weak lingual manipulation;Lingual pumping;Reduced posterior propulsion;Decreased bolus cohesion;Premature spillage Oral - Mech Soft Weak lingual manipulation;Lingual pumping;Reduced posterior propulsion;Piecemeal swallowing;Delayed oral transit;Decreased bolus cohesion;Premature spillage Oral - Regular -- Oral - Multi-Consistency -- Oral - Pill -- Oral Phase - Comment --  CHL IP PHARYNGEAL PHASE 11/29/2016 Pharyngeal Phase Impaired Pharyngeal- Pudding Teaspoon -- Pharyngeal -- Pharyngeal- Pudding Cup -- Pharyngeal -- Pharyngeal- Honey Teaspoon -- Pharyngeal -- Pharyngeal- Honey Cup -- Pharyngeal -- Pharyngeal- Nectar Teaspoon Reduced epiglottic inversion;Reduced laryngeal elevation;Reduced airway/laryngeal closure;Reduced tongue base retraction;Pharyngeal residue - valleculae Pharyngeal -- Pharyngeal- Nectar Cup Reduced pharyngeal peristalsis;Reduced epiglottic inversion;Reduced tongue base  retraction;Pharyngeal residue - valleculae Pharyngeal -- Pharyngeal- Nectar Straw Reduced epiglottic inversion;Reduced anterior laryngeal mobility;Reduced laryngeal elevation;Reduced airway/laryngeal closure;Reduced tongue base retraction;Pharyngeal residue - valleculae Pharyngeal -- Pharyngeal- Thin Teaspoon Reduced epiglottic inversion;Reduced anterior laryngeal mobility;Reduced laryngeal elevation;Reduced airway/laryngeal closure;Reduced tongue base retraction;Pharyngeal residue - valleculae Pharyngeal -- Pharyngeal- Thin Cup Reduced epiglottic inversion;Reduced anterior laryngeal mobility;Reduced laryngeal elevation;Reduced airway/laryngeal closure;Reduced tongue base retraction;Pharyngeal residue - valleculae Pharyngeal -- Pharyngeal- Thin Straw Reduced epiglottic inversion;Reduced anterior laryngeal mobility;Reduced airway/laryngeal closure;Reduced tongue base retraction;Pharyngeal residue - valleculae Pharyngeal -- Pharyngeal- Puree Reduced epiglottic inversion;Reduced anterior laryngeal mobility;Reduced laryngeal elevation;Reduced airway/laryngeal closure Pharyngeal -- Pharyngeal- Mechanical Soft Reduced epiglottic inversion;Reduced anterior laryngeal mobility;Reduced laryngeal elevation;Reduced tongue base retraction;Pharyngeal residue - valleculae Pharyngeal -- Pharyngeal- Regular -- Pharyngeal -- Pharyngeal- Multi-consistency -- Pharyngeal -- Pharyngeal- Pill -- Pharyngeal -- Pharyngeal Comment pt does not sense residuals, dry swallows helpful - chin tuck and head turn right/left not helpful  CHL IP CERVICAL ESOPHAGEAL PHASE 11/29/2016 Cervical Esophageal Phase Impaired Pudding Teaspoon -- Pudding Cup -- Honey Teaspoon -- Honey Cup -- Nectar Teaspoon -- Nectar Cup -- Nectar Straw -- Thin Teaspoon -- Thin Cup -- Thin Straw -- Puree -- Mechanical Soft -- Regular -- Multi-consistency -- Pill -- Cervical Esophageal Comment slow clearance of barium = without pt awareness, liquids faciliate clearance No  flowsheet data found. Macario Golds 11/29/2016, 9:38 AM  Luanna Salk, MS [facility] SLP [phone]               Scheduled Meds: . feeding supplement (ENSURE ENLIVE)  237 mL Oral BID BM  . ferrous sulfate  325 mg Oral TID WC  . folic acid  1 mg Oral Daily  . Gerhardt's butt cream  1 application Topical BID  . metoprolol succinate  12.5 mg Oral Daily  . multivitamin with minerals  1 tablet Oral Daily  . nicotine  21 mg Transdermal Daily  . pantoprazole  40 mg Oral BID  . polyethylene glycol  17 g Oral Daily  . thiamine  100 mg Intramuscular Once  . thiamine  100 mg Oral Daily   Continuous Infusions:   LOS: 5 days    Time spent: 30 min    Janece Canterbury, MD [facility] Pager [phone]  If 7PM-7AM, please contact  night-coverage [email or web address] Password [email or web address] 11/30/2016, 5:30 PM

## 2016-11-30 NOTE — Progress Notes (Addendum)
  Speech Language Pathology Treatment: Dysphagia  Patient Details Name: Patrick [name] MRN: [id number] DOB: [birth year] Today's Date: 11/30/2016 Time: 1221-1242 SLP Time Calculation (min) (ACUTE ONLY): 21 min  Assessment / Plan / Recommendation Clinical Impression  Pt demonstrating ongoing dysphagia symptoms and is not following compensation strategies despite total cues.  Suspect component of chronic dysphagia as pt states "I cough all the time".  He showed some improvement in awareness after dysphagia demonstrated during session.  Note results of CT chest showing patulous esophagus, moderate hiatal hernia and chronic aspiration risk.  And concern for possible Right lobe infiltrates.  If pt desires po = it should be with known aspiration risks.  Do not anticipate he will be compliant with strategies due to his mentation.  He will be at increased risk for aspiration pneumonia due to his multifactorial dysphagia. He has significant pharyngeal dysphagia - with resultant residuals in pharynx that do not transit into esophagus.      Spoke to MD regarding pt's dysphagia later - approximately 1430.  She advised palliative consult placed and she spoke to family re: concerns.  Thank you for providing such thorough and excellent care Dr Sheran Fava.  Recommend follow up at [facility] for trial dysphagia management, treatment to see if can strengthen swallow musculature in attempts to return to baseline dysfunction.     HPI HPI: 66 yo male adm to [facility]  - found at home where he had been sitting for multiple days in his urine.  Pt has h/o etoh use, GERD and is for involuntary commitment due to concerns for his well being.  RN noted pt was coughing with intake = and swallow evaluation was ordered.  Pt states he coughs all the time.  MBS indicatd to allow instrumental eval and determine least restrictive diet.       SLP Plan  Continue with current plan of care       Recommendations  Diet recommendations:  (diet with  accepted risks if desired) Liquids provided via: Straw;Cup Medication Administration: Crushed with puree Supervision: Full supervision/cueing for compensatory strategies Compensations: Slow rate;Small sips/bites;Multiple dry swallows after each bite/sip;Follow solids with liquid Postural Changes and/or Swallow Maneuvers: Seated upright 90 degrees;Upright 30-60 min after meal                Oral Care Recommendations: Oral care QID Follow up Recommendations: Skilled Nursing facility SLP Visit Diagnosis: Dysphagia, oropharyngeal phase (R13.12) Plan: Continue with current plan of care       GO                Patrick [name] 11/30/2016, 12:44 PM  Luanna Salk, [facility] SLP [phone]

## 2016-11-30 NOTE — Progress Notes (Signed)
Pt IVC has been rescinded. Faxed change of commitment form signed by attending to [employer], received fax confirmation.  Placed above as well as IVC copies in pt's chart. Will continue following and assist with transfer to [facility] for rehab at [place]. Per facility, [facility] authorization has been granted.   Sharren Bridge, MSW, LCSW Clinical Social Work 11/30/2016 [phone]

## 2016-12-01 DIAGNOSIS — Z7189 Other specified counseling: Secondary | ICD-10-CM

## 2016-12-01 DIAGNOSIS — Z515 Encounter for palliative care: Secondary | ICD-10-CM | POA: Diagnosis not present

## 2016-12-01 DIAGNOSIS — N179 Acute kidney failure, unspecified: Secondary | ICD-10-CM | POA: Diagnosis not present

## 2016-12-01 DIAGNOSIS — F1010 Alcohol abuse, uncomplicated: Secondary | ICD-10-CM | POA: Diagnosis not present

## 2016-12-01 DIAGNOSIS — R1312 Dysphagia, oropharyngeal phase: Secondary | ICD-10-CM | POA: Diagnosis not present

## 2016-12-01 DIAGNOSIS — D649 Anemia, unspecified: Secondary | ICD-10-CM | POA: Diagnosis not present

## 2016-12-01 DIAGNOSIS — F1096 Alcohol use, unspecified with alcohol-induced persisting amnestic disorder: Secondary | ICD-10-CM

## 2016-12-01 LAB — BASIC METABOLIC PANEL
Anion gap: 10 (ref 5–15)
BUN: 10 mg/dL (ref 6–20)
CALCIUM: 7.9 mg/dL — AB (ref 8.9–10.3)
CO2: 23 mmol/L (ref 22–32)
CREATININE: 0.92 mg/dL (ref 0.61–1.24)
Chloride: 104 mmol/L (ref 101–111)
GFR calc Af Amer: >60 mL/min (ref >60)
GLUCOSE: 84 mg/dL (ref 65–99)
Potassium: 3.4 mmol/L — ABNORMAL LOW (ref 3.5–5.1)
Sodium: 137 mmol/L (ref 135–145)

## 2016-12-01 LAB — CBC
HCT: 33.1 % — ABNORMAL LOW (ref 39.0–52.0)
HEMOGLOBIN: 11.1 g/dL — AB (ref 13.0–17.0)
MCH: 32.2 pg (ref 26.0–34.0)
MCHC: 33.5 g/dL (ref 30.0–36.0)
MCV: 95.9 fL (ref 78.0–100.0)
Platelets: 227 10*3/uL (ref 150–400)
RBC: 3.45 MIL/uL — ABNORMAL LOW (ref 4.22–5.81)
RDW: 15.8 % — AB (ref 11.5–15.5)
WBC: 7.7 10*3/uL (ref 4.0–10.5)

## 2016-12-01 LAB — PROCALCITONIN: PROCALCITONIN: 0.54 ng/mL

## 2016-12-01 MED ORDER — FOLIC ACID 1 MG PO TABS: 1.0000 mg | ORAL_TABLET | Freq: Every day | ORAL | 0 refills | Status: DC

## 2016-12-01 MED ORDER — FERROUS SULFATE 325 (65 FE) MG PO TABS: 325.0000 mg | ORAL_TABLET | Freq: Three times a day (TID) | ORAL | 0 refills | Status: DC

## 2016-12-01 MED ORDER — RESOURCE THICKENUP CLEAR PO POWD: ORAL | Status: DC

## 2016-12-01 MED ORDER — POTASSIUM CHLORIDE CRYS ER 20 MEQ PO TBCR
40.0000 meq | EXTENDED_RELEASE_TABLET | Freq: Once | ORAL | Status: AC
  Administered 2016-12-01: 40 meq via ORAL
  Filled 2016-12-01: qty 2

## 2016-12-01 MED ORDER — PANTOPRAZOLE SODIUM 40 MG PO TBEC: 40.0000 mg | DELAYED_RELEASE_TABLET | Freq: Two times a day (BID) | ORAL | 0 refills | Status: DC

## 2016-12-01 MED ORDER — NICOTINE 21 MG/24HR TD PT24: 21.0000 mg | MEDICATED_PATCH | Freq: Every day | TRANSDERMAL | 0 refills | Status: DC

## 2016-12-01 MED ORDER — GERHARDT'S BUTT CREAM: 1.0000 "application " | TOPICAL_CREAM | Freq: Two times a day (BID) | CUTANEOUS | 0 refills | Status: DC

## 2016-12-01 MED ORDER — THIAMINE HCL 100 MG PO TABS: 100.0000 mg | ORAL_TABLET | Freq: Every day | ORAL | 0 refills | Status: AC

## 2016-12-01 MED ORDER — ENSURE ENLIVE PO LIQD: 237.0000 mL | Freq: Two times a day (BID) | ORAL | 0 refills | Status: DC

## 2016-12-01 MED ORDER — METOPROLOL SUCCINATE ER 25 MG PO TB24: 12.5000 mg | ORAL_TABLET | Freq: Every day | ORAL | 0 refills | Status: DC

## 2016-12-01 NOTE — Clinical Social Work Placement (Signed)
Pt will DC today to [facility] to [name]. Report # for RN  [phone] Pt will transfer via [place] completed medical necessity form and will arrange transport once all discharge needs in place. Pt's sister Patrick [name] spoke with CSW via phone- aware and in agreement with plan- both sisters out of state and report they will follow up with facility once pt arrives.  All information provided to facility via the [place].  See below for placement details   CLINICAL SOCIAL WORK PLACEMENT  NOTE  Date:  12/01/2016  Patient Details  Name: Patrick [name] MRN: [id number] Date of Birth: April 12, 1950  Clinical Social Work is seeking post-discharge placement for this patient at the [name] of care (*CSW will initial, date and re-position this form in  chart as items are completed):  Yes   Patient/family provided with [name]'s list of facilities offering this [name] of care within the geographic area requested by the patient (or if unable, by the patient's family).  Yes   Patient/family informed of their freedom to choose among providers that offer the needed [name] of care, that participate in Medicare, Medicaid or managed care program needed by the patient, have an available bed and are willing to accept the patient.  Yes   Patient/family informed of [facility]'s ownership interest in [facility] and [name], as well as of the fact that they are under no obligation to receive care at these facilities.  PASRR submitted to EDS on       PASRR number received on 11/27/16     Existing PASRR number confirmed on       FL2 transmitted to all facilities in geographic area requested by pt/family on 11/27/16     FL2 transmitted to all facilities within larger geographic area on       Patient informed that his/her managed care company has contracts with or will negotiate with certain facilities, including the following:        Yes    Patient/family informed of bed offers received.  Patient chooses bed at [name]     Physician recommends and patient chooses bed at [name]    Patient to be transferred to [name] on 12/01/16.  Patient to be transferred to facility by [facility]     Patient family notified on 12/01/16 of transfer.  Name of family member notified:  sister [name]      PHYSICIAN Please prepare priority discharge summary, including medications     Additional Comment:    _______________________________________________ Nila Nephew, LCSW 12/01/2016, 11:03 AM  [phone]

## 2016-12-01 NOTE — Consult Note (Signed)
Consultation Note Date: 12/01/2016   Patient Name: Patrick [name]  DOB: 01-24-1951  MRN: [id number]  Age / Sex: 66 y.o., male  PCP: Patrick Post, MD Referring Physician: Janece Canterbury, MD  Reason for Consultation: Establishing goals of care  HPI/Patient Profile: 66 y.o. male   admitted on 11/25/2016  66 year old woman with a past medical history significant for all: Use, history of depression, was initially brought into the emergency department and was involuntarily committed due to his inability to care for himself. Patient has been admitted with symptomatic anemia, metabolic encephalopathy, severe dysphagia.  Patient has also been found to have fluid-filled esophagus, ongoing high risk for aspiration with dyspnea and wheezing. Also initially had mild rhabdomyolysis and acute kidney injury at the time of admission. Surface echocardiogram showing preserved ejection fraction, CT angios of the chest was negative for pulmonary embolism. Patient's liver function tests reflect transaminitis secondary to alcohol use.   Unfortunately patient also has a stage II pressure ulcer, irritant/contact dermatitis that is deemed secondary to sitting in stool for several days. Patient has other sequela of ETOH use such as thrombocytopenia and severe protein calorie malnutrition.  Clinical Assessment and Goals of Care:  A palliative consultation has been requested for goals of care discussions particularly in light of severe ongoing dysphasia, ongoing generalized decline, severe physical deconditioning and to establish medical power of attorney as patient has been deemed not decisional by psychiatric consultations.  [name] is sitting up in bed. He is able to state his name. He does not know why he is in the hospital. He complains of extreme weakness. He states he is no longer able to stand or walk. He has been given  thickened juice. He takes a few teaspoons of that but has coughing. He appears confused and weak. He is not able to provide much of history.  Call placed and discussed with sister Patrick [name] who states that she lives in [place]. She has not spoken with the patient for over a year. Patrick [name] states that occasionally the patient's ex-wife Patrick [name] will call her and give her updates about her brother. The patient lives at home with his 2 sons who are aged 70 and 33 years. It is reported that both of them or the 66 year old son is currently in drug rehabilitation detoxification program.  Patient's sister Patrick [name] is appreciative of all of the medical information she has received pertaining to her brother's care. She understands he has ongoing risks of aspiration, ongoing Global decline from EtOH use. We discussed about his extensive medical conditions. Goals wishes and values discussions attempted. See recommendations below. Thank you for the consult.  NEXT OF KIN  Patient's sister Patrick [name] who lives in [place] is next of kin. Patient is divorced from his wife Patrick [name]. Patient has 2 sons, aged 90 and 13. Reportedly, the 73 year old is in a drug rehabilitation detox program currently. Reportedly also has another sister who lives in [place].  SUMMARY OF RECOMMENDATIONS    1. Agree with skilled nursing facility for rehabilitation attempt with  palliative care to follow-up over there.  2. Discussed with sister Patrick [name] about establishing healthcare power of attorney/guardianship paperwork given the patient's serious illness and high potential for ongoing decline/decompensation and the need for complex medical decision-making.   3. Appreciate speech therapy's recommendations. Continue the safest possible oral diet with as much precautions as possible. Sister understands that the patient still continues to have a high risk for ongoing aspiration. She firmly believes that a PEG tube would not be what the patient would  want.  4. We discussed extensively about CODE STATUS. Sister wishes to check in with the patient's 92 year old son once he is finished with his drug rehabilitation/detoxification program and see if he wants to participate in decision-making for the patient. Would recommend that the social worker at nursing facility or social worker through [facility] offer her assistance in going about guardianship process over the patient.  Code Status/Advance Care Planning:  Full code    Symptom Management:    Continue current mode of care.   Palliative Prophylaxis:   Bowel Regimen  Additional Recommendations (Limitations, Scope, Preferences):  Full Scope Treatment  Psycho-social/Spiritual:   Desire for further Chaplaincy support:no  Additional Recommendations: Caregiving  Support/Resources  Prognosis:   Unable to determine  Discharge Planning: [place] for rehab with Palliative care service follow-up      Primary Diagnoses: Present on Admission: . Symptomatic anemia . Pressure ulcer of sacral region, stage 2 . Alcohol abuse . Thrombocytopenia ([place]) . Hypotension . Hypokalemia . Hypomagnesemia . Hyponatremia   I have reviewed the medical record, interviewed the patient and family, and examined the patient. The following aspects are pertinent.  Past Medical History:  Diagnosis Date  . Colon polyps   . Depression   . Diverticulitis   . Diverticulosis of colon   . GERD (gastroesophageal reflux disease)   . Kidney stone    Social History   Social History  . Marital status: Divorced    Spouse name: N/A  . Number of children: N/A  . Years of education: N/A   Social History Main Topics  . Smoking status: Current Every Day Smoker    Packs/day: 1.00    Types: Cigarettes  . Smokeless tobacco: Never Used  . Alcohol use 8.4 oz/week    14 Glasses of wine per week  . Drug use: No  . Sexual activity: Not Asked   Other Topics Concern   . None   Social History Narrative  . None   Family History  Problem Relation Age of Onset  . Heart disease Father 68       cabg   Scheduled Meds: . feeding supplement (ENSURE ENLIVE)  237 mL Oral BID BM  . ferrous sulfate  325 mg Oral TID WC  . folic acid  1 mg Oral Daily  . Gerhardt's butt cream  1 application Topical BID  . metoprolol succinate  12.5 mg Oral Daily  . multivitamin with minerals  1 tablet Oral Daily  . nicotine  21 mg Transdermal Daily  . pantoprazole  40 mg Oral BID  . polyethylene glycol  17 g Oral Daily  . thiamine  100 mg Intramuscular Once  . thiamine  100 mg Oral Daily   Continuous Infusions: PRN Meds:.acetaminophen **OR** acetaminophen, HYDROcodone-acetaminophen, ipratropium-albuterol, ondansetron **OR** ondansetron (ZOFRAN) IV, RESOURCE THICKENUP CLEAR Medications Prior to Admission:  Prior to Admission medications   Medication Sig Start Date End Date Taking? Authorizing Provider  albuterol (VENTOLIN HFA) 108 (90 BASE) MCG/ACT  inhaler Inhale 2 puffs into the lungs every 4 (four) hours as needed for wheezing or shortness of breath. 08/17/14  Yes Burchette, Alinda Sierras, MD  amLODipine (NORVASC) 5 MG tablet TAKE 1 TABLET BY MOUTH DAILY. 10/18/16  Yes Burchette, Alinda Sierras, MD  losartan (COZAAR) 100 MG tablet TAKE 1 TABLET BY MOUTH DAILY. 10/18/16  Yes Burchette, Alinda Sierras, MD  metoprolol succinate (TOPROL-XL) 50 MG 24 hr tablet TAKE ONE TABLET BY MOUTH ONE TIME DAILY WITH OR IMMEDIATELY FOLLOWING A MEAL 07/12/16  Yes Burchette, Alinda Sierras, MD  pantoprazole (PROTONIX) 40 MG tablet TAKE 1 TABLET (40 MG TOTAL) BY MOUTH DAILY. **NEED OFFICE VISIT FOR MORE REFILLS** 09/07/16  Yes Burchette, Alinda Sierras, MD  polyethylene glycol powder (GLYCOLAX/MIRALAX) powder Take 17 grams in 8 oz of water or juice daily. Patient taking differently: Take 1 Container by mouth daily as needed for mild constipation or moderate constipation. Take 17 grams in 8 oz of water or juice 02/23/15  Yes Zehr,  Janett Billow D, PA-C  triamcinolone cream (KENALOG) 0.1 % Apply 1 application topically 2 (two) times daily as needed. Patient taking differently: Apply 1 application topically 2 (two) times daily as needed (rash).  03/17/15  Yes Burchette, Alinda Sierras, MD  Omeprazole-Sodium Bicarbonate (ZEGERID) 20-1100 MG CAPS capsule Take 1 capsule by mouth at bedtime. Patient not taking: Reported on 11/25/2016 01/15/15   Gatha Mayer, MD   No Known Allergies Review of Systems +confused  Physical Exam No acute distress, sitting up in bed Scattered wheezes bilaterally S1-S2 Abdomen soft nontender Extremities with no edema Awake but confused. Coughs while attempting to eat to his thickened fluids.  Vital Signs: BP 116/73 (BP Location: Left Arm)   Pulse (!) 108   Temp 98.2 F (36.8 C) (Oral)   Resp 20   Ht 5\' 8"  (1.727 m)   Wt 65.8 kg (145 lb)   SpO2 97%   BMI 22.05 kg/m  Pain Assessment: 0-10 POSS *See Group Information*: S-Acceptable,Sleep, easy to arouse Pain Score: Asleep   SpO2: SpO2: 97 % O2 Device:SpO2: 97 % O2 Flow Rate: .O2 Flow Rate (L/min): 2 L/min  IO: Intake/output summary:  Intake/Output Summary (Last 24 hours) at 12/01/16 1107 Last data filed at 12/01/16 0411  Gross per 24 hour  Intake                0 ml  Output              975 ml  Net             -975 ml   PPS 30% LBM: Last BM Date: 11/28/16 Baseline Weight: Weight: 65.8 kg (145 lb) Most recent weight: Weight: 65.8 kg (145 lb)     Palliative Assessment/Data:   Flowsheet Rows     Most Recent Value  Intake Tab  Referral Department  Hospitalist  Unit at Time of Referral  Med/Surg Unit  Palliative Care Primary Diagnosis  Other (Comment) [ETOH, deconditioning, asp pna. ]  Palliative Care Type  New Palliative care  Reason for referral  Clarify Goals of Care  Date first seen by Palliative Care  12/01/16  Clinical Assessment  Palliative Performance Scale Score  30%  Pain Max last 24 hours  3  Pain Min Last 24 hours  2   Dyspnea Max Last 24 Hours  3  Dyspnea Min Last 24 hours  2  Nausea Max Last 24 Hours  3  Nausea Min Last 24 Hours  2  Anxiety Max  Last 24 Hours  3  Anxiety Min Last 24 Hours  2  Psychosocial & Spiritual Assessment  Palliative Care Outcomes  Patient/Family meeting held?  Yes  Who was at the meeting?  patient, who is non decisional, call placed and discussed with sister next of kin.   Palliative Care Outcomes  Clarified goals of care      Time In:  10 Time Out:  11.10 Time Total:  70 min  Greater than 50%  of this time was spent counseling and coordinating care related to the above assessment and plan.  Signed by: Loistine Chance, MD  [phone]  Please contact Palliative Medicine Team phone at [phone] for questions and concerns.  For individual provider: See Shea Evans

## 2016-12-01 NOTE — Progress Notes (Signed)
Nutrition Follow-up  DOCUMENTATION CODES:   Non-severe (moderate) malnutrition in context of social or environmental circumstances  INTERVENTION:   Ensure Enlive po BID, each supplement provides 350 kcal and 20 grams of protein  Magic cup TID with meals, each supplement provides 290 kcal and 9 grams of protein  Daily MVI  Obtain weekly weights  NUTRITION DIAGNOSIS:   Malnutrition (Moderate) related to social / environmental circumstances (alcohol abuse) as evidenced by percent weight loss, moderate depletions of muscle mass, moderate depletion of body fat.  Ongoing  GOAL:   Patient will meet greater than or equal to 90% of their needs  Progressing  MONITOR:   PO intake, Supplement acceptance, Labs, Weight trends  REASON FOR ASSESSMENT:   Consult Assessment of nutrition requirement/status  ASSESSMENT:   Pt with PMH of alcohol abuse, depression, and GERD. Presents this admission after being involuntary committed by family for inability to care for herself. Noted to have symptomatic anemia with suspected GI bleed.     Pt reports appetite has increased slowly since admission, currently consuming 50% average of his last 5 meals. Pt would like to have magic cups daily. No new wt obtained since 8/18. Would like to get weekly wts, to track wt trends. Pt has not had BM in 3 days. Will monitor BM status. Plans to d/c to SNF with palliative care.   Medications reviewed and include: ferrous sulfate, folic acid, MVI, thiamine Labs reviewed: K 3.4 (L) Calcium 7.9 (L)  Diet Order:  DIET - DYS 1 Room service appropriate? Yes; Fluid consistency: Nectar Thick  Skin:   (stg I buttocks stg II sacrum)  Last BM:  11/28/16  Height:   Ht Readings from Last 1 Encounters:  11/25/16 5\' 8"  (1.727 m)    Weight:   Wt Readings from Last 1 Encounters:  11/25/16 145 lb (65.8 kg)    Ideal Body Weight:  70 kg  BMI:  Body mass index is 22.05 kg/m.  Estimated Nutritional Needs:    Kcal:  1700-1900 (26-29 kcal/kg)  Protein:  95-105 grams (1.4-1.6 g/kg)  Fluid:  >1.7 L/day  EDUCATION NEEDS:   No education needs identified at this time  [place], LDN Clinical Nutrition Pager # - [phone]

## 2016-12-01 NOTE — Discharge Summary (Addendum)
Physician Discharge Summary  Patrick [name]:[id number] DOB: 03-Aug-1950 DOA: 11/25/2016  PCP: [name], MD  Admit date: 11/25/2016 Discharge date: 12/01/2016  Admitted From: SNF  Disposition:  home  Recommendations for Outpatient Follow-up:  1. Palliative care to consult please for ongoing [place] conversations, including code status 2. BMP and CBC in 2 weeks to follow up anemia and hypokalemia  Home Health:  PT/OT and speech therapy  Equipment/Devices:  Buttocks:  Cleanse buttocks with soap and water and pat gently dry. Apply Boudreaux butt paste (or zinc oxide) twice daily.  Prompt incontinence care.   Discharge Condition:  Stable, improved CODE STATUS:  Full code  Diet recommendation:  High risk of aspiration Diet recommendations: Dysphagia 1 (puree);Nectar-thick liquid Liquids provided via: Straw;Cup Medication Administration: Crushed with puree Supervision: Full supervision/cueing for compensatory strategies Compensations: Slow rate;Small sips/bites;Multiple dry swallows after each bite/sip;Follow solids with liquid Postural Changes and/or Swallow Maneuvers: Seated upright 90 degrees;Upright 30-60 min after meal   Brief/Interim Summary:  The patient is a 66 year old male with history of alcohol abuse, depression, and reflux who was involuntarily committed by his family members because of his inability to care for himself. The patient was found sitting on the floor his own feces and urine because he was too weak to get up and move. He reported drinking several glasses of gin and tonic daily and he has had some intermittent dark stools. Otherwise, he was a very poor historian. In the emergency department, he was found to be anemic with a hemoglobin of 7, mildly tachycardic and mildly hypotensive.  He had severe electrolyte depletion was given supplementation of potassium, calcium, magnesium.  He had mild rhabdomyolysis which improved with IV fluids. Importantly, his strength  remained very poor and he had difficulty swallowing. He was assessed by physical and occupational therapy recommended skilled nursing facility for rehabilitation. The speech therapist with him daily and his swallow did not improve. He is high risk for aspiration and tends to pocket food in the back with mouth unable to swallow it into his esophagus. His condition was discussed with the patient who did not have capacity to make medical decisions for himself and with his family member's sister. We discussed the possibility of a feeding tube, however given his desire for independence probable lack of compliance and ability to understand the purpose in care of the feeding tube, the family elected not to pursue a feeding tube at this time. The patient and the family were educated about the high risk of aspiration which could result in recurrent pneumonias or even death.  They met with palliative care to discuss goals of care and at this time the patient is not going to receive a feeding tube but he is still full code until patient's sister can discuss CODE STATUS with the patient's son who is currently in inpatient rehabilitation.  Regarding the patient's anemia, he was transfused 1 unit of PRBCs and his hemoglobin improved. Subsequent occult stool was negative.  He was started on iron supplementation. Gastroenterology was consultative felt that the patient was not a good candidate for any sort of procedures at this time.    Discharge Diagnoses:  Principal Problem:   Symptomatic anemia Active Problems:   Thrombocytopenia (HCC)   Hypokalemia   Pressure ulcer of sacral region, stage 2   Alcohol abuse   Hypotension   Hypomagnesemia   Hyponatremia   Non-traumatic rhabdomyolysis   Dysphagia   Encounter for palliative care   Goals of care, counseling/discussion  Alcohol induced Korsakoff syndrome (HCC)  Symptomatic anemia, occult negative.  Likely due to chronic inflammation and marrow suppression from  alcohol use.  Elevated ferritin.  Folate, b12, and TSH wnl.   Hbg on January of last year was 12. Negative FOBT's. Transfused 1 unit of packed red blood cells. Hemoglobin 11.1 g/dl on day of discharge Ferrous sulfate daily.  Severe dysphagia without improvement since admission -  Patient high risk for aspiration.  Discussed possibility of feeding tube with sister who stated she feels he would not be a good candidate for a feeding tube due to poor insight and poor likelihood he would adhere to NPO status for safety.   -  Continue dysphagia 1 diet for comfort -  Palliative care met with family and confirmed decision regarding feeding tube.   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  Ammonia level was wnl. Psychiatry was consulted who deemed the patient not have capacity to make medical decisions or decisions regarding his disposition.   Agree that patient still does not have capacity to make his own medical decisions We have consulted social worker to help with healthcare power of attorney. Physical therapy evaluated the patient recommended skilled nursing facility.  Sinus tachycardia, likely related to alcohol withdrawal and deconditioning, HR trended down with metoprolol -  TSH 1.6 wnl -  Cortisol 8.4 -  D-dimer:  Positive -  CT angio chest:  Negative for PE -   ECHO:  Preserved EF  Fluid-filled esophagus/patulous esophagus -  GI:  GERD and not recommending any imaging or procedures at this time -  PPI   Dyspnea and wheezing, likely due to aspiration, improved -  CXR:  Possible infiltrate vs. Edema in the RUE.  I suspect this is partly due to volume overload from IVF and patient may have underlying cirrhosis.  He is afebrile -  Continue duonebs -  procalcitonin was 0.32 - 0.54, mildly elevated, but as patient did not have fevers or WBC, I have held off on starting  antibiotics at this time.    Acute kidney injury due to ARB and dehydration, resolved  Mild rhabdomyolysis, Resolved with IV fluid hydration.  EtOH abuse:  Continue thiamine and folate Completed CIWA protocol.  Electrolyte abnormalities: Hyponatremia, hypokalemia, hypomagnesemia, hypophosphatemia:   electrolytes were supplemented.    Essential hypertension:  Held antihypertensives due to low normal blood pressures  transaminitis: With a 2:1 ratio AST 2 ALT likely due to alcohol.  Stage II pressure ulcer plus irritant dermatitis from sitting in stool for several days Cleanse buttocks with soap and water and pat gently dry. Apply Boudreaux butt paste twice daily.  Prompt incontinence care.   Thrombocytopenia: Likely due to alcohol abuse.  Moderate protein caloric malnutrition. - Ensure Enlive   Discharge Instructions     Medication List    STOP taking these medications   amLODipine 5 MG tablet Commonly known as:  NORVASC   losartan 100 MG tablet Commonly known as:  COZAAR   Omeprazole-Sodium Bicarbonate 20-1100 MG Caps capsule Commonly known as:  ZEGERID   triamcinolone cream 0.1 % Commonly known as:  KENALOG     TAKE these medications   albuterol 108 (90 Base) MCG/ACT inhaler Commonly known as:  VENTOLIN HFA Inhale 2 puffs into the lungs every 4 (four) hours as needed for wheezing or shortness of breath.   feeding supplement (ENSURE ENLIVE) Liqd Take 237 mLs by  mouth 2 (two) times daily between meals.   ferrous sulfate 325 (65 FE) MG tablet Take 1 tablet (325 mg total) by mouth 3 (three) times daily with meals.   folic acid 1 MG tablet Commonly known as:  FOLVITE Take 1 tablet (1 mg total) by mouth daily.   Gerhardt's butt cream Crea Apply 1 application topically 2 (two) times daily.   metoprolol succinate 25 MG 24 hr tablet Commonly known as:  TOPROL-XL Take 0.5 tablets (12.5 mg total) by mouth daily. What changed:  medication  strength  See the new instructions.   nicotine 21 mg/24hr patch Commonly known as:  NICODERM CQ - dosed in mg/24 hours Place 1 patch (21 mg total) onto the skin daily.   pantoprazole 40 MG tablet Commonly known as:  PROTONIX Take 1 tablet (40 mg total) by mouth 2 (two) times daily. What changed:  See the new instructions.   polyethylene glycol powder powder Commonly known as:  GLYCOLAX/MIRALAX Take 17 grams in 8 oz of water or juice daily. What changed:  how much to take  how to take this  when to take this  reasons to take this  additional instructions   Conrath to liquids to make nectar thick.   thiamine 100 MG tablet Take 1 tablet (100 mg total) by mouth daily.       Contact information for follow-up providers    [name], MD Follow up.   Specialty:  Family Medicine Contact information: [place] [address] [phone]            Contact information for after-discharge care    Destination    [facility] Follow up.   Specialty:  [place] information: [address] [place] [phone]                 No Known Allergies  Consultants:   Psychiatry  Palliative care  Gastroenterology, Dr. Ardis Hughs  Procedures/Studies: Dg Chest 2 View  Result Date: 11/25/2016 CLINICAL DATA:  Cough and wheezing.  Hypotensive. EXAM: CHEST  2 VIEW COMPARISON:  Chest radiograph April 26, 2011 FINDINGS: Increased lung volumes with apical bullous changes. Cardiac silhouette is normal in size. Mediastinal silhouette is nonsuspicious, mildly calcified aortic knob. Small to moderate hiatal hernia. No pleural effusion or focal consolidation. No pneumothorax. Soft tissue planes and included osseous structures are nonsuspicious. IMPRESSION: COPD, no acute cardiopulmonary process. Small to moderate hiatal hernia. Electronically  Signed   By: Elon Alas M.D.   On: 11/25/2016 03:14   Ct Angio Chest Pe W Or Wo Contrast  Result Date: 11/29/2016 CLINICAL DATA:  Cough, shortness of breath. Suspect pulmonary embolism. History of smoking, alcohol abuse and erosive esophagitis. EXAM: CT ANGIOGRAPHY CHEST WITH CONTRAST TECHNIQUE: Multidetector CT imaging of the chest was performed using the standard protocol during bolus administration of intravenous contrast. Multiplanar CT image reconstructions and MIPs were obtained to evaluate the vascular anatomy. CONTRAST:  170 cc Isovue 370 COMPARISON:  Chest radiograph November 29, 2016 at 1052 hours FINDINGS: Patient was coughing during examination resulting in moderate motion artifact. Examination with re-attempted without significant image quality improvement. CARDIOVASCULAR: Suboptimal contrast opacification of the pulmonary artery's (200 Hounsfield units, target is 250 Hounsfield units. Main pulmonary artery is not enlarged. No pulmonary arterial filling defects to the level of the segmental branches. Heart size is normal, no right heart strain. No pericardial effusion. Thoracic aorta is  normal course and caliber, mild calcific atherosclerosis. MEDIASTINUM/NODES: No lymphadenopathy by CT size criteria. Patulous fluid-filled esophagus. LUNGS/PLEURA: Tracheobronchial tree is patent, no pneumothorax. Mild centrilobular emphysema. Apical bullous changes. Strandy densities RIGHT lower lobe. UPPER ABDOMEN: Included view of the abdomen is nonacute. Moderate hiatal hernia. Hepatic steatosis. MUSCULOSKELETAL: Visualized soft tissues and included osseous structures appear nonacute. Old posterior RIGHT rib fractures. Moderate included cervical spondylosis. Review of the MIP images confirms the above findings. IMPRESSION: 1. No acute pulmonary embolism on this moderately motion degraded examination. 2. RIGHT lower lobe atelectasis.  Mild centrilobular emphysema. 3. Patulous fluid-filled esophagus with  moderate hiatal hernia, aspiration risk. Aortic Atherosclerosis (ICD10-I70.0) and Emphysema (ICD10-J43.9). Electronically Signed   By: Elon Alas M.D.   On: 11/29/2016 19:43   Dg Chest Port 1 View  Result Date: 11/29/2016 CLINICAL DATA:  Wheezing EXAM: PORTABLE CHEST 1 VIEW COMPARISON:  11/25/2016 FINDINGS: Heart size upper limits of normal. Aortic atherosclerosis. Hiatal hernia present. Question hazy infiltrate right lower lobe. The remainder the chest is clear. No effusions. No significant bone finding. IMPRESSION: Question AC pneumonia right lower lobe. Aortic atherosclerosis. Hiatal hernia. Electronically Signed   By: Nelson Chimes M.D.   On: 11/29/2016 11:18   Dg Swallowing Func-speech Pathology  Result Date: 11/29/2016 Objective Swallowing Evaluation: Type of Study: MBS-Modified Barium Swallow Study Patient Details Name: JAXSEN BERNHART MRN: [id number] Date of Birth: 1951-04-03 Today's Date: 11/29/2016 Time: SLP Start Time (ACUTE ONLY): 0845-SLP Stop Time (ACUTE ONLY): 0915 SLP Time Calculation (min) (ACUTE ONLY): 30 min Past Medical History: Past Medical History: Diagnosis Date . Colon polyps  . Depression  . Diverticulitis  . Diverticulosis of colon  . GERD (gastroesophageal reflux disease)  . Kidney stone  Past Surgical History: Past Surgical History: Procedure Laterality Date . COLONOSCOPY   . UPPER GASTROINTESTINAL ENDOSCOPY   HPI: 66 yo male adm to [facility]  - found at home where he had been sitting for multiple days in his urine.  Pt has h/o etoh use, GERD and is for involuntary commitment due to concerns for his well being.  RN noted pt was coughing with intake = and swallow evaluation was ordered.  Pt states he coughs all the time.  MBS indicatd to allow instrumental eval and determine least restrictive diet.  Subjective: pt awake in chair` Assessment / Plan / Recommendation CHL IP CLINICAL IMPRESSIONS 11/29/2016 Clinical Impression Patient presents with moderate oropharyngeal dysphagia with  sensorimotor deficits.  Decreased oral control characterized with lingual rocking - delayed oral transiting and premature spillage noted.  Pharyngeal swallow was timely but decreased tongue base retraction/epiglottic deflection results in vallecular residuals without patient awareness.  Pt did have aspiration of thin via cup before the swallow due to decreased timing of laryngeal closure resulting in cough.  Of note patient did NOT cough before during or after MBS except during aspiration event.  Dry swallow help to decrease vallecular residuals but patient requires cues to conduct them.  Strict aspiration precautions recommended.  Using live images, educated patient to findings/recommendations.  Advised him to strengthen his cough/hock.  SLP Visit Diagnosis Dysphagia, oropharyngeal phase (R13.12) Attention and concentration deficit following -- Frontal lobe and executive function deficit following -- Impact on safety and function Moderate aspiration risk   CHL IP TREATMENT RECOMMENDATION 11/29/2016 Treatment Recommendations Therapy as outlined in treatment plan below   Prognosis 11/29/2016 Prognosis for Safe Diet Advancement Guarded Barriers to Reach Goals Severity of deficits Barriers/Prognosis Comment -- CHL IP DIET RECOMMENDATION 11/29/2016 SLP Diet Recommendations Dysphagia  1 (Puree) solids;Nectar thick liquid Liquid Administration via Cup;Straw Medication Administration Crushed with puree Compensations Slow rate;Small sips/bites;Multiple dry swallows after each bite/sip;Follow solids with liquid;Effortful swallow Postural Changes Remain semi-upright after after feeds/meals (Comment);Seated upright at 90 degrees   CHL IP OTHER RECOMMENDATIONS 11/29/2016 Recommended Consults -- Oral Care Recommendations Oral care BID Other Recommendations Order thickener from pharmacy   CHL IP FOLLOW UP RECOMMENDATIONS 11/29/2016 Follow up Recommendations None   CHL IP FREQUENCY AND DURATION 11/29/2016 Speech Therapy Frequency (ACUTE  ONLY) min 2x/week Treatment Duration 1 week      CHL IP ORAL PHASE 11/29/2016 Oral Phase Impaired Oral - Pudding Teaspoon -- Oral - Pudding Cup -- Oral - Honey Teaspoon -- Oral - Honey Cup -- Oral - Nectar Teaspoon Weak lingual manipulation;Lingual pumping;Reduced posterior propulsion;Delayed oral transit;Decreased bolus cohesion;Premature spillage Oral - Nectar Cup Weak lingual manipulation;Lingual pumping;Reduced posterior propulsion;Decreased bolus cohesion;Premature spillage Oral - Nectar Straw Weak lingual manipulation;Lingual pumping;Delayed oral transit;Decreased bolus cohesion;Premature spillage Oral - Thin Teaspoon Weak lingual manipulation;Lingual pumping;Delayed oral transit;Decreased bolus cohesion;Premature spillage Oral - Thin Cup Weak lingual manipulation;Lingual pumping;Delayed oral transit;Decreased bolus cohesion;Premature spillage Oral - Thin Straw -- Oral - Puree Weak lingual manipulation;Lingual pumping;Reduced posterior propulsion;Decreased bolus cohesion;Premature spillage Oral - Mech Soft Weak lingual manipulation;Lingual pumping;Reduced posterior propulsion;Piecemeal swallowing;Delayed oral transit;Decreased bolus cohesion;Premature spillage Oral - Regular -- Oral - Multi-Consistency -- Oral - Pill -- Oral Phase - Comment --  CHL IP PHARYNGEAL PHASE 11/29/2016 Pharyngeal Phase Impaired Pharyngeal- Pudding Teaspoon -- Pharyngeal -- Pharyngeal- Pudding Cup -- Pharyngeal -- Pharyngeal- Honey Teaspoon -- Pharyngeal -- Pharyngeal- Honey Cup -- Pharyngeal -- Pharyngeal- Nectar Teaspoon Reduced epiglottic inversion;Reduced laryngeal elevation;Reduced airway/laryngeal closure;Reduced tongue base retraction;Pharyngeal residue - valleculae Pharyngeal -- Pharyngeal- Nectar Cup Reduced pharyngeal peristalsis;Reduced epiglottic inversion;Reduced tongue base retraction;Pharyngeal residue - valleculae Pharyngeal -- Pharyngeal- Nectar Straw Reduced epiglottic inversion;Reduced anterior laryngeal  mobility;Reduced laryngeal elevation;Reduced airway/laryngeal closure;Reduced tongue base retraction;Pharyngeal residue - valleculae Pharyngeal -- Pharyngeal- Thin Teaspoon Reduced epiglottic inversion;Reduced anterior laryngeal mobility;Reduced laryngeal elevation;Reduced airway/laryngeal closure;Reduced tongue base retraction;Pharyngeal residue - valleculae Pharyngeal -- Pharyngeal- Thin Cup Reduced epiglottic inversion;Reduced anterior laryngeal mobility;Reduced laryngeal elevation;Reduced airway/laryngeal closure;Reduced tongue base retraction;Pharyngeal residue - valleculae Pharyngeal -- Pharyngeal- Thin Straw Reduced epiglottic inversion;Reduced anterior laryngeal mobility;Reduced airway/laryngeal closure;Reduced tongue base retraction;Pharyngeal residue - valleculae Pharyngeal -- Pharyngeal- Puree Reduced epiglottic inversion;Reduced anterior laryngeal mobility;Reduced laryngeal elevation;Reduced airway/laryngeal closure Pharyngeal -- Pharyngeal- Mechanical Soft Reduced epiglottic inversion;Reduced anterior laryngeal mobility;Reduced laryngeal elevation;Reduced tongue base retraction;Pharyngeal residue - valleculae Pharyngeal -- Pharyngeal- Regular -- Pharyngeal -- Pharyngeal- Multi-consistency -- Pharyngeal -- Pharyngeal- Pill -- Pharyngeal -- Pharyngeal Comment pt does not sense residuals, dry swallows helpful - chin tuck and head turn right/left not helpful  CHL IP CERVICAL ESOPHAGEAL PHASE 11/29/2016 Cervical Esophageal Phase Impaired Pudding Teaspoon -- Pudding Cup -- Honey Teaspoon -- Honey Cup -- Nectar Teaspoon -- Nectar Cup -- Nectar Straw -- Thin Teaspoon -- Thin Cup -- Thin Straw -- Puree -- Mechanical Soft -- Regular -- Multi-consistency -- Pill -- Cervical Esophageal Comment slow clearance of barium = without pt awareness, liquids faciliate clearance No flowsheet data found. Macario Golds 11/29/2016, 9:38 AM  Luanna Salk, MS [facility] SLP [phone]              Subjective: Flossie Dibble still hurts  but it is getting better.  Denies chest pains, shortness of breath, cough, difficulty swallowing, coughing with swallowing, abdominal pain or nausea.    Discharge Exam: Vitals:   11/30/16 2257 12/01/16 0410  BP: (!) 110/91 116/73  Pulse: 96 (!) 108  Resp: 18 20  Temp: 98.6 F (37 C) 98.2 F (36.8 C)  SpO2: 99% 97%   Vitals:   11/30/16 0614 11/30/16 0728 11/30/16 2257 12/01/16 0410  BP: (!) 85/60 105/62 (!) 110/91 116/73  Pulse: 99 (!) 108 96 (!) 108  Resp:  _0 Temp:  98.1 F (36.7 C) 98.6 F (37 C) 98.2 F (36.8 C)  TempSrc:  Oral Oral Oral  SpO2:  96% 99% 97%  Weight:      Height:        General: Pt is alert, awake, not in acute distress Cardiovascular: RRR, S1/S2 +, no rubs, no gallops Respiratory: CTA bilaterally, no wheezing, no rhonchi Abdominal: Soft, NT, ND, bowel sounds + Extremities: no edema, no cyanosis Neuro:  Strength 4/5 throughout    The results of significant diagnostics from this hospitalization (including imaging, microbiology, ancillary and laboratory) are listed below for reference.     Microbiology: Recent Results (from the past 240 hour(s))  MRSA PCR Screening     Status: None   Collection Time: 11/25/16  6:24 AM  Result Value Ref Range Status   MRSA by PCR NEGATIVE NEGATIVE Final    Comment:        The GeneXpert MRSA Assay (FDA approved for NASAL specimens only), is one component of a comprehensive MRSA colonization surveillance program. It is not intended to diagnose MRSA infection nor to guide or monitor treatment for MRSA infections.      Labs: BNP (last 3 results) No results for input(s): BNP in the last 8760 hours. Basic Metabolic Panel:  Recent Labs Lab 11/25/16 0340 11/25/16 1453 11/25/16 1833 11/26/16 0343 11/27/16 0435 11/28/16 0434 11/29/16 0441 11/30/16 0436 12/01/16 0536  NA  --  133*  --  136 141 142 140 139 137  K  --  3.5  --  4.1 4.4 4.4 3.8 3.4* 3.4*  CL  --  104  --  109 111 108 104 101 104   CO2  --  17*  --  19* 21* _1 GLUCOSE  --  269*  --  148* 75 109* 97 104* 84  BUN  --  32*  --  30* _2 CREATININE  --  1.68*  --  1.29* 1.00 0.93 0.87 0.87 0.92  CALCIUM  --  6.9*  --  7.2* 7.1* 6.9* 7.0* 7.6* 7.9*  MG 0.5*  --  1.5*  --   --   --   --   --   --   PHOS  --  1.9*  1.9*  --  1.2* 4.1 5.6* 5.1*  --   --    Liver Function Tests:  Recent Labs Lab 11/25/16 0210 11/25/16 1453 11/26/16 0343 11/27/16 0435 11/28/16 0434 11/29/16 0441  AST 71*  --   --   --   --   --   ALT 19  --   --   --   --   --   ALKPHOS 45  --   --   --   --   --   BILITOT 1.6*  --   --   --   --   --   PROT 6.7  --   --   --   --   --   ALBUMIN 3.4* 3.0* 2.8* 2.7* 2.8* 2.7*   No results for input(s): LIPASE, AMYLASE in the last 168 hours.  Recent  Labs Lab 11/25/16 0335  AMMONIA 19   CBC:  Recent Labs Lab 11/25/16 0210 11/25/16 1453 11/27/16 1015 11/30/16 0436 12/01/16 0536  WBC 7.1 3.5* 6.0 7.6 7.7  NEUTROABS 4.5  --   --   --   --   HGB 7.0* 8.3* 8.3* 8.5* 11.1*  HCT 18.8* 22.8* 23.2* 25.1* 33.1*  MCV 92.2 92.3 95.1 95.8 95.9  PLT 113* 111* 182 240 227   Cardiac Enzymes:  Recent Labs Lab 11/25/16 0340 11/26/16 1324  CKTOTAL 1,131* 381   BNP: Invalid input(s): POCBNP CBG:  Recent Labs Lab 11/27/16 2206 11/28/16 0727 11/28/16 1139 11/28/16 1803 11/28/16 2130  GLUCAP 140* 85 107* 100* 101*   D-Dimer  Recent Labs  11/29/16 1551  DDIMER 2.48*   Hgb A1c No results for input(s): HGBA1C in the last 72 hours. Lipid Profile No results for input(s): CHOL, HDL, LDLCALC, TRIG, CHOLHDL, LDLDIRECT in the last 72 hours. Thyroid function studies No results for input(s): TSH, T4TOTAL, T3FREE, THYROIDAB in the last 72 hours.  Invalid input(s): FREET3 Anemia work up No results for input(s): VITAMINB12, FOLATE, FERRITIN, TIBC, IRON, RETICCTPCT in the last 72 hours. Urinalysis    Component Value Date/Time   COLORURINE yellow 02/21/2010 1055    APPEARANCEUR Clear 02/21/2010 1055   LABSPEC 1.025 02/21/2010 1055   PHURINE 5.0 02/21/2010 1055   HGBUR negative 02/21/2010 1055   BILIRUBINUR n 03/11/2014 1025   PROTEINUR n 03/11/2014 1025   UROBILINOGEN 0.2 03/11/2014 1025   UROBILINOGEN 0.2 02/21/2010 1055   NITRITE n 03/11/2014 1025   NITRITE negative 02/21/2010 1055   LEUKOCYTESUR Negative 03/11/2014 1025   Sepsis Labs Invalid input(s): PROCALCITONIN,  WBC,  LACTICIDVEN   Time coordinating discharge: Over 30 minutes  SIGNED:   Janece Canterbury, MD  [facility] 12/01/2016, 1:48 PM Pager   If 7PM-7AM, please contact night-coverage [email or web address] Password [email or web address]

## 2016-12-01 NOTE — Progress Notes (Signed)
Report received from Sandria Senter., RN. Patient resting in bed comfortably. Will continue to monitor

## 2016-12-01 NOTE — Progress Notes (Signed)
Handoff report given to Jerry, RN. 

## 2016-12-20 ENCOUNTER — Inpatient Hospital Stay (HOSPITAL_COMMUNITY): Payer: BLUE CROSS/BLUE SHIELD

## 2016-12-20 ENCOUNTER — Encounter (HOSPITAL_COMMUNITY): Payer: Self-pay | Admitting: Nurse Practitioner

## 2016-12-20 ENCOUNTER — Inpatient Hospital Stay (HOSPITAL_COMMUNITY)
Admission: EM | Admit: 2016-12-20 | Discharge: 2016-12-23 | DRG: 391 | Discharge status: Against Medical Advice | Payer: BLUE CROSS/BLUE SHIELD | Attending: Internal Medicine | Admitting: Internal Medicine

## 2016-12-20 ENCOUNTER — Emergency Department (HOSPITAL_COMMUNITY): Payer: BLUE CROSS/BLUE SHIELD

## 2016-12-20 DIAGNOSIS — I10 Essential (primary) hypertension: Secondary | ICD-10-CM | POA: Diagnosis present

## 2016-12-20 DIAGNOSIS — K921 Melena: Secondary | ICD-10-CM | POA: Diagnosis present

## 2016-12-20 DIAGNOSIS — N179 Acute kidney failure, unspecified: Secondary | ICD-10-CM | POA: Diagnosis present

## 2016-12-20 DIAGNOSIS — E440 Moderate protein-calorie malnutrition: Secondary | ICD-10-CM

## 2016-12-20 DIAGNOSIS — E872 Acidosis, unspecified: Secondary | ICD-10-CM | POA: Diagnosis present

## 2016-12-20 DIAGNOSIS — I4892 Unspecified atrial flutter: Secondary | ICD-10-CM | POA: Diagnosis present

## 2016-12-20 DIAGNOSIS — K449 Diaphragmatic hernia without obstruction or gangrene: Secondary | ICD-10-CM | POA: Diagnosis present

## 2016-12-20 DIAGNOSIS — K228 Other specified diseases of esophagus: Secondary | ICD-10-CM | POA: Diagnosis present

## 2016-12-20 DIAGNOSIS — K5730 Diverticulosis of large intestine without perforation or abscess without bleeding: Secondary | ICD-10-CM | POA: Diagnosis present

## 2016-12-20 DIAGNOSIS — Z87442 Personal history of urinary calculi: Secondary | ICD-10-CM

## 2016-12-20 DIAGNOSIS — E869 Volume depletion, unspecified: Secondary | ICD-10-CM | POA: Diagnosis present

## 2016-12-20 DIAGNOSIS — Z8249 Family history of ischemic heart disease and other diseases of the circulatory system: Secondary | ICD-10-CM

## 2016-12-20 DIAGNOSIS — Z86010 Personal history of colonic polyps: Secondary | ICD-10-CM

## 2016-12-20 DIAGNOSIS — K920 Hematemesis: Secondary | ICD-10-CM | POA: Diagnosis present

## 2016-12-20 DIAGNOSIS — D72829 Elevated white blood cell count, unspecified: Secondary | ICD-10-CM

## 2016-12-20 DIAGNOSIS — G9341 Metabolic encephalopathy: Secondary | ICD-10-CM | POA: Diagnosis present

## 2016-12-20 DIAGNOSIS — E876 Hypokalemia: Secondary | ICD-10-CM | POA: Diagnosis not present

## 2016-12-20 DIAGNOSIS — R509 Fever, unspecified: Secondary | ICD-10-CM

## 2016-12-20 DIAGNOSIS — F17210 Nicotine dependence, cigarettes, uncomplicated: Secondary | ICD-10-CM | POA: Diagnosis present

## 2016-12-20 DIAGNOSIS — E46 Unspecified protein-calorie malnutrition: Secondary | ICD-10-CM | POA: Diagnosis present

## 2016-12-20 DIAGNOSIS — F1029 Alcohol dependence with unspecified alcohol-induced disorder: Secondary | ICD-10-CM

## 2016-12-20 DIAGNOSIS — F1020 Alcohol dependence, uncomplicated: Secondary | ICD-10-CM | POA: Diagnosis present

## 2016-12-20 DIAGNOSIS — L89152 Pressure ulcer of sacral region, stage 2: Secondary | ICD-10-CM | POA: Diagnosis present

## 2016-12-20 DIAGNOSIS — R739 Hyperglycemia, unspecified: Secondary | ICD-10-CM | POA: Diagnosis present

## 2016-12-20 DIAGNOSIS — K922 Gastrointestinal hemorrhage, unspecified: Secondary | ICD-10-CM | POA: Diagnosis present

## 2016-12-20 DIAGNOSIS — D62 Acute posthemorrhagic anemia: Secondary | ICD-10-CM | POA: Diagnosis present

## 2016-12-20 DIAGNOSIS — F1026 Alcohol dependence with alcohol-induced persisting amnestic disorder: Secondary | ICD-10-CM | POA: Diagnosis present

## 2016-12-20 DIAGNOSIS — Z6822 Body mass index (BMI) 22.0-22.9, adult: Secondary | ICD-10-CM

## 2016-12-20 DIAGNOSIS — F1010 Alcohol abuse, uncomplicated: Secondary | ICD-10-CM | POA: Diagnosis present

## 2016-12-20 DIAGNOSIS — K210 Gastro-esophageal reflux disease with esophagitis: Principal | ICD-10-CM | POA: Diagnosis present

## 2016-12-20 DIAGNOSIS — F17200 Nicotine dependence, unspecified, uncomplicated: Secondary | ICD-10-CM | POA: Diagnosis present

## 2016-12-20 LAB — COMPREHENSIVE METABOLIC PANEL
ALT: 14 U/L — AB (ref 17–63)
ANION GAP: 17 — AB (ref 5–15)
AST: 28 U/L (ref 15–41)
Albumin: 2.6 g/dL — ABNORMAL LOW (ref 3.5–5.0)
Alkaline Phosphatase: 66 U/L (ref 38–126)
BUN: 25 mg/dL — ABNORMAL HIGH (ref 6–20)
CHLORIDE: 104 mmol/L (ref 101–111)
CO2: 21 mmol/L — ABNORMAL LOW (ref 22–32)
Calcium: 7.5 mg/dL — ABNORMAL LOW (ref 8.9–10.3)
Creatinine, Ser: 1.32 mg/dL — ABNORMAL HIGH (ref 0.61–1.24)
GFR calc non Af Amer: 56 mL/min — ABNORMAL LOW (ref >60)
Glucose, Bld: 191 mg/dL — ABNORMAL HIGH (ref 65–99)
Potassium: 3.5 mmol/L (ref 3.5–5.1)
SODIUM: 142 mmol/L (ref 135–145)
Total Bilirubin: 0.7 mg/dL (ref 0.3–1.2)
Total Protein: 5.4 g/dL — ABNORMAL LOW (ref 6.5–8.1)

## 2016-12-20 LAB — URINALYSIS, ROUTINE W REFLEX MICROSCOPIC
BILIRUBIN URINE: NEGATIVE
GLUCOSE, UA: 50 mg/dL — AB
KETONES UR: 20 mg/dL — AB
Leukocytes, UA: NEGATIVE
NITRITE: NEGATIVE
PH: 5 (ref 5.0–8.0)
Protein, ur: NEGATIVE mg/dL
Specific Gravity, Urine: 1.013 (ref 1.005–1.030)

## 2016-12-20 LAB — CBC WITH DIFFERENTIAL/PLATELET
Basophils Absolute: 0 10*3/uL (ref 0.0–0.1)
Basophils Relative: 0 %
EOS ABS: 0 10*3/uL (ref 0.0–0.7)
EOS PCT: 0 %
HCT: 26.5 % — ABNORMAL LOW (ref 39.0–52.0)
Hemoglobin: 8.9 g/dL — ABNORMAL LOW (ref 13.0–17.0)
LYMPHS ABS: 0.8 10*3/uL (ref 0.7–4.0)
Lymphocytes Relative: 4 %
MCH: 31.9 pg (ref 26.0–34.0)
MCHC: 33.6 g/dL (ref 30.0–36.0)
MCV: 95 fL (ref 78.0–100.0)
Monocytes Absolute: 1.3 10*3/uL — ABNORMAL HIGH (ref 0.1–1.0)
Monocytes Relative: 7 %
Neutro Abs: 17.7 10*3/uL — ABNORMAL HIGH (ref 1.7–7.7)
Neutrophils Relative %: 89 %
PLATELETS: 315 10*3/uL (ref 150–400)
RBC: 2.79 MIL/uL — AB (ref 4.22–5.81)
RDW: 14.9 % (ref 11.5–15.5)
WBC: 19.8 10*3/uL — AB (ref 4.0–10.5)

## 2016-12-20 LAB — I-STAT TROPONIN, ED: Troponin i, poc: 0 ng/mL (ref 0.00–0.08)

## 2016-12-20 LAB — ETHANOL: Alcohol, Ethyl (B): 21 mg/dL — ABNORMAL HIGH (ref <5)

## 2016-12-20 LAB — LIPASE, BLOOD: Lipase: 18 U/L (ref 11–51)

## 2016-12-20 LAB — LACTIC ACID, PLASMA
Lactic Acid, Venous: 1 mmol/L (ref 0.5–1.9)
Lactic Acid, Venous: 1.7 mmol/L (ref 0.5–1.9)

## 2016-12-20 LAB — HEMOGLOBIN AND HEMATOCRIT, BLOOD
HCT: 21.6 % — ABNORMAL LOW (ref 39.0–52.0)
HEMOGLOBIN: 7.5 g/dL — AB (ref 13.0–17.0)

## 2016-12-20 LAB — I-STAT CG4 LACTIC ACID, ED: LACTIC ACID, VENOUS: 5.14 mmol/L — AB (ref 0.5–1.9)

## 2016-12-20 LAB — MRSA PCR SCREENING: MRSA BY PCR: NEGATIVE

## 2016-12-20 LAB — HEMOGLOBIN A1C
Hgb A1c MFr Bld: 5.2 % (ref 4.8–5.6)
Mean Plasma Glucose: 102.54 mg/dL

## 2016-12-20 LAB — PROTIME-INR
INR: 1.19
PROTHROMBIN TIME: 15 s (ref 11.4–15.2)

## 2016-12-20 LAB — AMMONIA: AMMONIA: 13 umol/L (ref 9–35)

## 2016-12-20 MED ORDER — SODIUM CHLORIDE 0.9 % IV SOLN
8.0000 mg/h | INTRAVENOUS | Status: DC
  Filled 2016-12-20: qty 80

## 2016-12-20 MED ORDER — SODIUM CHLORIDE 0.9 % IV SOLN
8.0000 mg/h | INTRAVENOUS | Status: DC
  Administered 2016-12-20 – 2016-12-21 (×2): 8 mg/h via INTRAVENOUS
  Filled 2016-12-20 (×5): qty 80

## 2016-12-20 MED ORDER — VANCOMYCIN HCL 500 MG IV SOLR
500.0000 mg | Freq: Two times a day (BID) | INTRAVENOUS | Status: DC
  Administered 2016-12-20 – 2016-12-22 (×4): 500 mg via INTRAVENOUS
  Filled 2016-12-20 (×4): qty 500

## 2016-12-20 MED ORDER — ONDANSETRON HCL 4 MG/2ML IJ SOLN: 4.0000 mg | Freq: Four times a day (QID) | INTRAMUSCULAR | Status: DC | PRN

## 2016-12-20 MED ORDER — LORAZEPAM 2 MG/ML IJ SOLN
1.0000 mg | Freq: Four times a day (QID) | INTRAMUSCULAR | Status: AC | PRN
Start: 2016-12-20 — End: 2016-12-23
  Administered 2016-12-23: 1 mg via INTRAVENOUS
  Filled 2016-12-20: qty 1

## 2016-12-20 MED ORDER — SODIUM CHLORIDE 0.9 % IV SOLN: INTRAVENOUS | Status: DC

## 2016-12-20 MED ORDER — PIPERACILLIN-TAZOBACTAM 3.375 G IVPB
3.3750 g | Freq: Three times a day (TID) | INTRAVENOUS | Status: DC
  Administered 2016-12-20 – 2016-12-22 (×5): 3.375 g via INTRAVENOUS
  Filled 2016-12-20 (×5): qty 50

## 2016-12-20 MED ORDER — LORAZEPAM 1 MG PO TABS: 1.0000 mg | ORAL_TABLET | Freq: Four times a day (QID) | ORAL | Status: DC | PRN

## 2016-12-20 MED ORDER — SODIUM CHLORIDE 0.9 % IV BOLUS (SEPSIS)
1000.0000 mL | Freq: Once | INTRAVENOUS | Status: AC
  Administered 2016-12-20: 1000 mL via INTRAVENOUS

## 2016-12-20 MED ORDER — ALBUTEROL SULFATE (2.5 MG/3ML) 0.083% IN NEBU
3.0000 mL | INHALATION_SOLUTION | RESPIRATORY_TRACT | Status: DC | PRN
  Administered 2016-12-22: 3 mL via RESPIRATORY_TRACT
  Filled 2016-12-20: qty 3

## 2016-12-20 MED ORDER — PANTOPRAZOLE SODIUM 40 MG IV SOLR
80.0000 mg | Freq: Once | INTRAVENOUS | Status: DC
  Filled 2016-12-20: qty 80

## 2016-12-20 MED ORDER — NICOTINE 21 MG/24HR TD PT24
21.0000 mg | MEDICATED_PATCH | Freq: Every day | TRANSDERMAL | Status: DC
  Administered 2016-12-20 – 2016-12-23 (×4): 21 mg via TRANSDERMAL
  Filled 2016-12-20 (×4): qty 1

## 2016-12-20 MED ORDER — THIAMINE HCL 100 MG/ML IJ SOLN: 100.0000 mg | Freq: Every day | INTRAMUSCULAR | Status: DC

## 2016-12-20 MED ORDER — IOPAMIDOL (ISOVUE-300) INJECTION 61%
INTRAVENOUS | Status: AC
  Administered 2016-12-20: 16:00:00
  Filled 2016-12-20: qty 100

## 2016-12-20 MED ORDER — LORAZEPAM 2 MG/ML IJ SOLN: 0.0000 mg | Freq: Two times a day (BID) | INTRAMUSCULAR | Status: DC

## 2016-12-20 MED ORDER — SODIUM CHLORIDE 0.9 % IV SOLN
80.0000 mg | Freq: Once | INTRAVENOUS | Status: AC
  Administered 2016-12-20: 13:00:00 80 mg via INTRAVENOUS
  Filled 2016-12-20: qty 80

## 2016-12-20 MED ORDER — ONDANSETRON HCL 4 MG PO TABS: 4.0000 mg | ORAL_TABLET | Freq: Four times a day (QID) | ORAL | Status: DC | PRN

## 2016-12-20 MED ORDER — PIPERACILLIN-TAZOBACTAM 3.375 G IVPB 30 MIN
3.3750 g | Freq: Once | INTRAVENOUS | Status: AC
  Administered 2016-12-20: 3.375 g via INTRAVENOUS
  Filled 2016-12-20: qty 50

## 2016-12-20 MED ORDER — IOPAMIDOL (ISOVUE-300) INJECTION 61%
100.0000 mL | Freq: Once | INTRAVENOUS | Status: AC | PRN
  Administered 2016-12-20: 100 mL via INTRAVENOUS

## 2016-12-20 MED ORDER — FOLIC ACID 1 MG PO TABS
1.0000 mg | ORAL_TABLET | Freq: Every day | ORAL | Status: DC
  Administered 2016-12-21 – 2016-12-23 (×3): 1 mg via ORAL
  Filled 2016-12-20 (×3): qty 1

## 2016-12-20 MED ORDER — VITAMIN B-1 100 MG PO TABS
100.0000 mg | ORAL_TABLET | Freq: Every day | ORAL | Status: DC
  Administered 2016-12-21 – 2016-12-23 (×3): 100 mg via ORAL
  Filled 2016-12-20 (×3): qty 1

## 2016-12-20 MED ORDER — SODIUM CHLORIDE 0.9 % IV SOLN
1000.0000 mL | INTRAVENOUS | Status: DC
  Administered 2016-12-20: 1000 mL via INTRAVENOUS

## 2016-12-20 MED ORDER — ADULT MULTIVITAMIN W/MINERALS CH
1.0000 | ORAL_TABLET | Freq: Every day | ORAL | Status: DC
  Administered 2016-12-21 – 2016-12-23 (×3): 1 via ORAL
  Filled 2016-12-20 (×3): qty 1

## 2016-12-20 MED ORDER — PANTOPRAZOLE SODIUM 40 MG IV SOLR: 40.0000 mg | Freq: Two times a day (BID) | INTRAVENOUS | Status: DC

## 2016-12-20 MED ORDER — ONDANSETRON HCL 4 MG/2ML IJ SOLN
4.0000 mg | Freq: Once | INTRAMUSCULAR | Status: AC
  Administered 2016-12-20: 4 mg via INTRAVENOUS
  Filled 2016-12-20: qty 2

## 2016-12-20 MED ORDER — LORAZEPAM 2 MG/ML IJ SOLN
0.0000 mg | Freq: Four times a day (QID) | INTRAMUSCULAR | Status: AC
  Administered 2016-12-21 – 2016-12-22 (×2): 2 mg via INTRAVENOUS
  Filled 2016-12-20 (×2): qty 1

## 2016-12-20 NOTE — ED Triage Notes (Signed)
Patients ex wife called police to check on patient. Patient tried to answer to door and fell. EMS called. Patient was covered in coffee ground emesis. Stated it started yesterday. Patient drinks ETOH everyday.

## 2016-12-20 NOTE — Consult Note (Signed)
[facility] Consult Note   History BURTIS IMHOFF MRN # [id number]  Date of Admission: 12/20/2016 Date of Consultation: 12/20/2016 Referring physician: Dr. Annita Brod, MD Primary Care Provider: [name], MD Primary Gastroenterologist: Dr. Silvano Rusk   Reason for Consultation/Chief Complaint: hematemesis  Subjective  HPI:  This is a 66 year old man last seen by my partner Dr. Carlean Purl in October 2016, at which time an upper endoscopy revealed severe esophagitis. This patient drinks alcohol heavily, and was found on the floor at home today having vomited a large amount of coffee grounds material. Apparently, his ex-wife had not heard from him in several days and called EMS. He does not seem to have much recollection of the last several days. He reports that his stomach feels "sick", and he cannot characterize it any further. He recalls vomiting last evening and into early this morning. He did not realize that the coffee grounds material was probably blood.  He has had 2 drinks of gin on average per day for many years. This has continued right until yesterday. He does not recall any previous episodes of hematemesis. He does not believe that he has passed bloody or black stool in the last several days. He also does not think there is been any recent weight loss.  At the time of admission in the ED he is found to be volume depleted, tachycardic with a lactic acidosis of 5.1. He is currently alert and mentating well, interactive and appropriate.  He does not believe he has had a bowel movement" a few days".  ROS:  Constitutional: generalized fatigue Eyes:   No visual change Cardiovascular:  no Chest pain Respiratory: no  dyspnea   All other systems are negative except as noted above in the HPI  Past Medical History Past Medical History:  Diagnosis Date  . Colon polyps   . Depression   . Diverticulitis   . Diverticulosis of colon   . GERD  (gastroesophageal reflux disease)   . Kidney stone     Past Surgical History Past Surgical History:  Procedure Laterality Date  . COLONOSCOPY    . UPPER GASTROINTESTINAL ENDOSCOPY      Family History Family History  Problem Relation Age of Onset  . Heart disease Father 81       cabg    Social History Social History   Social History  . Marital status: Divorced    Spouse name: N/A  . Number of children: N/A  . Years of education: N/A   Social History Main Topics  . Smoking status: Current Every Day Smoker    Packs/day: 1.00    Types: Cigarettes  . Smokeless tobacco: Never Used  . Alcohol use 8.4 oz/week    14 Glasses of wine per week  . Drug use: No  . Sexual activity: No   Other Topics Concern  . None   Social History Narrative  . None    Allergies No Known Allergies  Outpatient Meds Home medications from the H+P and/or nursing med reconciliation reviewed.  Inpatient med list reviewed The ER is currently given a Protonix drip and a dose of Zosyn 3.375 g out of concern for possible intra-abdominal infection with the patient's acute GI presentation and lactic acidosis _____________________________________________________________________ Objective   Exam:  Current vital signs  Patient Vitals for the past 8 hrs:  BP Temp Temp src Pulse Resp SpO2 Height Weight  12/20/16 1400 133/83 - - (!) 124 (!) 21 100 % - -  12/20/16 1338 (!) 143/98 - - (!) 132 16 97 % - -  12/20/16 1330 (!) 143/98 - - (!) 127 20 100 % - -  12/20/16 1301 (!) 144/94 - - (!) 131 16 100 % - -  12/20/16 1300 (!) 144/94 - - (!) 131 - 100 % - -  12/20/16 1201 - - - - - - 5\' 8"  (1.727 m) 145 lb (65.8 kg)  12/20/16 1200 (!) 130/97 98.2 F (36.8 C) Oral (!) 130 18 100 % - -    Intake/Output Summary (Last 24 hours) at 12/20/16 1600 Last data filed at 12/20/16 1440  Gross per 24 hour  Intake             3100 ml  Output                0 ml  Net             3100 ml    Physical  Exam:    General: this is a disheveled male patient in no acute distress. Poor muscle mass  Eyes: sclera anicteric, no redness  ENT: oral mucosa dry and without lesions, no cervical or supraclavicular lymphadenopathy, poor dentition, there is some residual black material on his tongue  CV: RRR without murmur, S1/S2, no JVD,, no peripheral edema  Resp: clear to auscultation bilaterally, normal RR and effort noted  GI: soft, no tenderness, with active but high-pitched bowel sounds. No guarding or palpable organomegaly noted  Skin; warm and dry, no rash or jaundice noted, pale  Neuro: awake, alert and oriented x 3. Normal gross motor function and fluent speech.  Labs:   Recent Labs Lab 12/20/16 1251  WBC 19.8*  HGB 8.9*  HCT 26.5*  PLT 315    Recent Labs Lab 12/20/16 1251  NA 142  K 3.5  CL 104  CO2 21*  BUN 25*  ALBUMIN 2.6*  ALKPHOS 66  ALT 14*  AST 28  GLUCOSE 191*    Recent Labs Lab 12/20/16 1251  INR 1.19   alcohol level 21 Lipase 19 Lactic acid 5.14  Radiologic studies: I have personally reviewed his abdominal and pelvic CT scan with the radiologist. There is a large hiatal hernia, and the stomach is distended with a large amount of liquid. There is collapse of the most distal stomach, but it is difficult to tell if this is from mucosal abnormality or peristalsis. Gallstones are seen, which were present on an ultrasound 2 years ago. There is also nonspecific gallbladder wall thickening without surrounding inflammation.Diverticulosis without surrounding inflammation is seen. There is no small or large bowel obstruction.  @ASSESSMENTPLANBEGIN @ Impression:  Hematemesis Anemia of acute GI blood loss, and there is also almost certainly a significant chronic component from alcohol abuse and marrow suppression Alcohol abuse, He does not have exam, lab or radiographic findings of cirrhosis. Lactic acidosis Leukocytosis of unclear cause, currently no  definite focus of infection, and I think antibiotics can be discontinued. 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 The findings of the distal stomach on imaging are of unclear nature, but he definitely needs an upper endoscopy when he is volume resuscitated and his acidemia is improved.  Then we can determine if there are any significant gastric findings and what seems to be the source of bleeding.  He does not appear to be having a brisk GI bleed, and I do not think he  has esophageal or gastric varices.   Plan:   nothing by mouth except ice chips Discontinue antibiotics unless there is a convincing source of infection Continue Protonix drip Serial hemoglobin and hematocrit every 8 hours Upper endoscopy tomorrow. Watch closely for signs of alcohol withdrawal  This patient is at increased risk of complications from endoscopic procedure due to his alcohol abuse and malnutrition  Thank you for the courtesy of this consult.  Please contact me with any questions or concerns.  Nelida Meuse III Pager: [phone] Mon-Fri 8a-5p [phone] after 5p, weekends, holidays

## 2016-12-20 NOTE — ED Notes (Signed)
Bed: BR83 Expected date: 12/20/16 Expected time:  Means of arrival:  Comments: EMS hematemesis

## 2016-12-20 NOTE — Progress Notes (Signed)
A consult was received from an ED physician for zosyn per pharmacy dosing.  The patient's profile has been reviewed for ht/wt/allergies/indication/available labs.    A one time order has been placed for zosyn.  Further antibiotics/pharmacy consults should be ordered by admitting physician if indicated.                       Thank you,  Dia Sitter, PharmD, BCPS 12/20/2016 2:59 PM

## 2016-12-20 NOTE — ED Notes (Signed)
Report given to ICU RN

## 2016-12-20 NOTE — H&P (Addendum)
History and Physical  Patrick [name] CHE:[id number] DOB: June 27, 1950 DOA: 12/20/2016  Referring physician:  Domenic Moras, ER PA PCP: [name], MD  Outpatient Specialists: none Patient coming from: Home & is able to ambulate with use of a walker  Chief Complaint: Throwing up blood   HPI: Patrick [name] is a 66 y.o. male with medical history significant for continued alcohol abuse, depression  whose ex-wife called EMS after she had not heard from him for several days. EMS entered the house and found the patient on the ground. He had been trying to get to the door but collapsed on the ground. Patient related to them that he had been throwing up dark black looking vomit several days this past week as well as been passing black tarry stool.  Patient also continues to drink, usually 2 glasses of gin a night, last drink was 2 days prior. He was brought in by EMS to the ER  ED Course: In the ER, patient's hemoglobin was found to be 8.9 each in review of previous blood work is actually close to his baseline. However, he was noted to be somewhat hemoconcentrated and a white count of 19.8 as well as acute kidney injury with creatinine 1.32, and his baseline is normal. Other lab work was noteworthy for a glucose of 191-no history of diabetes as well as a lactic acid level of 5.14. Patient was started on IV fluids and typed and crossed. GI was consulted erect minutes starting patient on IV Protonix and plans for endoscopy on the morning. Hospitalists were called for further evaluation and admission. Patient was placed in stepdown unit. In addition, EKG noted a flutter, this has since resolved.  Review of Systems: Patient seen after arrival to stepdown . Pt complains of feeling shaky, tired.  He has an ongoing tremor  Pt denies any headaches, vision changes, dysphagia, chest pain, palpitations, shortness of breath, wheeze, cough, abdominal pain, hematuria, dysuria, constipation, focal extremity numbness  weakness or pain .  Review of systems are otherwise negative   Past Medical History:  Diagnosis Date  . Colon polyps   . Depression   . Diverticulitis   . Diverticulosis of colon   . GERD (gastroesophageal reflux disease)   . Kidney stone    Past Surgical History:  Procedure Laterality Date  . COLONOSCOPY    . UPPER GASTROINTESTINAL ENDOSCOPY      Social History:  reports that he has been smoking Cigarettes.  He has been smoking about 1.00 pack per day. He has never used smokeless tobacco. He reports that he drinks about 8.4 oz of alcohol per week, 2 glasses of gin a day . He reports that he does not use drugs.  Lives at home by himself. He ambulates with use of a walker   No Known Allergies  Family History  Problem Relation Age of Onset  . Heart disease Father 50       cabg      Prior to Admission medications   Medication Sig Start Date End Date Taking? Authorizing Provider  amLODipine (NORVASC) 5 MG tablet Take 5 mg by mouth daily.   Yes [provider]  losartan (COZAAR) 100 MG tablet Take 100 mg by mouth daily.   Yes [provider]  pantoprazole (PROTONIX) 40 MG tablet Take 1 tablet (40 mg total) by mouth 2 (two) times daily. 12/01/16  Yes Short, Noah Delaine, MD  albuterol (VENTOLIN HFA) 108 (90 BASE) MCG/ACT inhaler Inhale 2 puffs into the  lungs every 4 (four) hours as needed for wheezing or shortness of breath. 08/17/14   Burchette, Alinda Sierras, MD  feeding supplement, ENSURE ENLIVE, (ENSURE ENLIVE) LIQD Take 237 mLs by mouth 2 (two) times daily between meals. 12/01/16   Janece Canterbury, MD  ferrous sulfate 325 (65 FE) MG tablet Take 1 tablet (325 mg total) by mouth 3 (three) times daily with meals. 12/01/16   Janece Canterbury, MD  folic acid (FOLVITE) 1 MG tablet Take 1 tablet (1 mg total) by mouth daily. 12/02/16   Janece Canterbury, MD  Hydrocortisone (GERHARDT'S BUTT CREAM) CREA Apply 1 application topically 2 (two) times daily. 12/01/16   Janece Canterbury, MD    Maltodextrin-Xanthan Gum ([place]) POWD Add to liquids to make nectar thick. 12/01/16   Janece Canterbury, MD  metoprolol succinate (TOPROL-XL) 25 MG 24 hr tablet Take 0.5 tablets (12.5 mg total) by mouth daily. 12/02/16   Janece Canterbury, MD  nicotine (NICODERM CQ - DOSED IN MG/24 HOURS) 21 mg/24hr patch Place 1 patch (21 mg total) onto the skin daily. 12/02/16   Janece Canterbury, MD  polyethylene glycol powder (GLYCOLAX/MIRALAX) powder Take 17 grams in 8 oz of water or juice daily. Patient taking differently: Take 1 Container by mouth daily as needed for mild constipation or moderate constipation. Take 17 grams in 8 oz of water or juice 02/23/15   Zehr, Jessica D, PA-C  thiamine 100 MG tablet Take 1 tablet (100 mg total) by mouth daily. 12/02/16   Janece Canterbury, MD    Physical Exam: BP (!) 147/126   Pulse (!) 133   Temp 98.2 F (36.8 C) (Oral)   Resp (!) 23   Ht 5\' 8"  (1.727 m)   Wt 65.8 kg (145 lb)   SpO2 92%   BMI 22.05 kg/m   General:  Alert and oriented 2, no acute distress, tremulous  Eyes: Sclera nonicteric, extraocular movements are intact  ENT: Normocephalic major medical mucous members are dry  Neck: Supple, no JVD  Cardiovascular: Regular rhythm, occasional ectopic beat, tachycardic  Respiratory: Clear to auscultation bilaterally  Abdomen: Soft, nontender, nondistended, positive bowel sounds  Skin: No skin breaks, tears or lesions  Musculoskeletal: No clubbing or cyanosis or edema  Psychiatric: Patient seems appropriate, no evidence of psychoses  Neurologic: Tremulous, no focal deficits           Labs on Admission:  Basic Metabolic Panel:  Recent Labs Lab 12/20/16 1251  NA 142  K 3.5  CL 104  CO2 21*  GLUCOSE 191*  BUN 25*  CREATININE 1.32*  CALCIUM 7.5*   Liver Function Tests:  Recent Labs Lab 12/20/16 1251  AST 28  ALT 14*  ALKPHOS 66  BILITOT 0.7  PROT 5.4*  ALBUMIN 2.6*    Recent Labs Lab 12/20/16 1251  LIPASE 18    No results for input(s): AMMONIA in the last 168 hours. CBC:  Recent Labs Lab 12/20/16 1251  WBC 19.8*  NEUTROABS 17.7*  HGB 8.9*  HCT 26.5*  MCV 95.0  PLT 315   Cardiac Enzymes: No results for input(s): CKTOTAL, CKMB, CKMBINDEX, TROPONINI in the last 168 hours.  BNP (last 3 results) No results for input(s): BNP in the last 8760 hours.  ProBNP (last 3 results) No results for input(s): PROBNP in the last 8760 hours.  CBG: No results for input(s): GLUCAP in the last 168 hours.  Radiological Exams on Admission: Ct Abdomen Pelvis W Contrast  Result Date: 12/20/2016 CLINICAL DATA:  [place] when trying  to answer the door, covered in coffee-ground emesis, abdominal pain starting yesterday EXAM: CT ABDOMEN AND PELVIS WITH CONTRAST TECHNIQUE: Multidetector CT imaging of the abdomen and pelvis was performed using the standard protocol following bolus administration of intravenous contrast. Sagittal and coronal MPR images reconstructed from axial data set. CONTRAST:  152mL ISOVUE-300 IOPAMIDOL (ISOVUE-300) INJECTION 61% IV. Water given as oral contrast. COMPARISON:  None FINDINGS: Lower chest: Minimal atelectasis at RIGHT lung base. Hepatobiliary: Diffuse fatty infiltration of liver. Nonspecific 13 x 9 mm low-attenuation lesion RIGHT lobe liver, slightly ill-defined, without enhancement or washout. Liver otherwise unremarkable. Dependent density in gallbladder question small gallstone. Pancreas: Normal appearance Spleen: Normal appearance Adrenals/Urinary Tract: Adrenal glands normal appearance. BILATERAL renal cysts largest lateral mid RIGHT kidney 3.0 x 2.8 cm image 28. Kidneys, ureters and bladder otherwise normal appearance Stomach/Bowel: Moderate sized hiatal hernia. Stomach distended by fluid. Wall thickening of the pylorus, cannot exclude component of gastric outlet obstruction. No adjacent infiltrative changes to suggest penetrating ulcer disease. Descending and sigmoid diverticulosis  without evidence of diverticulitis. Normal appendix. Small bowel loops unremarkable. Vascular/Lymphatic: Atherosclerotic calcifications aorta and iliac arteries without aneurysm. No adenopathy. Reproductive: Prostatic calcifications and minimal enlargement. Unremarkable seminal vesicles. Other: No free air or free fluid. No definite inflammatory process. No hernia. Musculoskeletal: Diffuse osseous demineralization. IMPRESSION: Distended stomach with a moderate sized hiatal hernia and thickening of the pylorus raising question of gastric outlet obstruction. Distal colonic diverticulosis without evidence of diverticulitis. Fatty infiltration of liver with a nonspecific slightly ill-defined 13 x 9 mm lesion in the RIGHT lobe ; followup nonemergent characterization by MR imaging recommended. Cholelithiasis. Electronically Signed   By: Lavonia Dana M.D.   On: 12/20/2016 15:18    EKG: Independently reviewed. A flutter   Assessment/Plan Present on Admission: . GI bleed: Highly suspicious for upper GI bleed, possible esophageal varices. Monitor closely. Repeat H&H 9 PM. Hemoglobin likely significantly lowered given acute kidney injury and this more from hemoconcentration. Transfuse for hemoglobin below 7 . TOBACCO USE: Nicotine patch . Hypertension: Holding antihypertensives in the setting of bleed . Alcohol dependence ([place]): Monitor for alcohol withdrawal, when necessary Ativan . Pressure ulcer of sacral region, stage 2: Wound care to see, present on admission. Patient has previous history of . AKI (acute kidney injury) ([place]) Atrial flutter: Incidentally noted on EKG on admission. We'll repeat in the morning Lactic acidosis: Do not suspect he has sepsis, more likely intravascular volume depletion. Aggressive rehydration  Addendum: Following admission, patient spiked a fever greater than 101. No drop in O2 saturations. Suspicious for possible aspiration when he vomited. Blood cultures are pending. Lactic acid  level actually resolved. Nevertheless, have ordered portable chest x-ray plus IV vancomycin and Zosyn.  Hyperglycemia: Noted elevated blood sugar on admission. No previous history of diabetes. Have ordered A1c. Principal Problem:   GI bleed Active Problems:   TOBACCO USE   Hypertension   Alcohol dependence ([facility])   Pressure ulcer of sacral region, stage 2   AKI (acute kidney injury) ([facility])   DVT prophylaxis: SCDs   Code Status: Full code as confirmed by patient   Family Communication: Patient has 2 sons, they are not present, currently out of town   Disposition Plan: Anticipate he will be here for several days, keep in stepdown until vital signs stable and he is not withdrawing   Consults called: [facility] -Dr.Danis  Admission status: Given need for acute inpatient services past 2 minutes, admit as inpatient     Annita Brod MD  [facility] Pager [phone]  If 7PM-7AM, please contact night-coverage [email or web address] Password [facility]  12/20/2016, 5:55 PM

## 2016-12-20 NOTE — ED Provider Notes (Signed)
Patient seen and evaluated. Discussed with PA Tron. Patient with alcoholism and upper GI bleed. Not bright red blood. Doubt variceal bleed. Anemia, lactic acidosis, renal insufficiency/acute kidney injury. Leukocytosis, tachycardia. Plan cultures, CT scan, IV PPI. Fluid resuscitation. Will require admission.   Tanna Furry, MD 12/20/16 1505

## 2016-12-20 NOTE — ED Provider Notes (Cosign Needed)
Patrick [name] Provider Note   CSN: [id number] Arrival date & time: 12/20/16  1150     History   Chief Complaint No chief complaint on file.   HPI [name] is a 66 y.o. male.  HPI    A 66 year old male with history of alcohol abuse, GERD, depression, diverticulosis brought here via EMS from home for evaluation of a fall and vomiting of blood. Per EMS, pt's ex wife request the police to check up on patient for a wellness check since she haven't heard from him for several days.  Upon entering the house pt was trying to get to the door but collapse on the ground.  Evidence of black tarry vomit noted on his clothes.  Pt report he has been vomiting the past past week and have notice black tarry stool.  He admits to drinking alcohol regularly, usually 2 glasses of gin.  He report abd pain, sharp and burning for the same duration.  Denies lightheadedness, dizziness, cp, sob, productive cough, vomit bright red blood, dysuria.  Report he has trouble ambulating for the past several months and also report unintentional weight loss of 10 lbs in 3 weeks.  Hx limited due to acuity of situation.  Does have GI specialist.  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    Past Medical History:  Diagnosis Date  . Colon polyps   . Depression   . Diverticulitis   . Diverticulosis of colon   . GERD (gastroesophageal reflux disease)   . Kidney stone     Patient Active Problem List   Diagnosis Date Noted  . 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 Encounter for palliative care   . Goals of care, counseling/discussion   . Dysphagia 11/30/2016  . Symptomatic anemia 11/25/2016  . Pressure ulcer of sacral region, stage 2 11/25/2016  . Alcohol abuse 11/25/2016  . Hypotension 11/25/2016  . Hypomagnesemia 11/25/2016  . Hyponatremia 11/25/2016  . Non-traumatic rhabdomyolysis 11/25/2016  . Erosive esophagitis 05/14/2015  . Hypokalemia 04/30/2015  .  Thrombocytopenia ([place]) 04/16/2015  . Alcohol dependence ([place]) 03/17/2015  . Change in bowel habits 02/23/2015  . Rectal bleeding 02/23/2015  . Constipation 02/23/2015  . Transaminasemia 03/27/2013  . Essential tremor 03/27/2013  . Hypertension 07/19/2012  . TOBACCO USE 03/01/2010  . DIVERTICULOSIS, COLON 05/14/2007  . DEPRESSION 11/05/2006  . COLONIC POLYPS, HX OF 11/05/2006    Past Surgical History:  Procedure Laterality Date  . COLONOSCOPY    . UPPER GASTROINTESTINAL ENDOSCOPY         Home Medications    Prior to Admission medications   Medication Sig Start Date End Date Taking? Authorizing Provider  albuterol (VENTOLIN HFA) 108 (90 BASE) MCG/ACT inhaler Inhale 2 puffs into the lungs every 4 (four) hours as needed for wheezing or shortness of breath. 08/17/14   Burchette, Alinda Sierras, MD  feeding supplement, ENSURE ENLIVE, (ENSURE ENLIVE) LIQD Take 237 mLs by mouth 2 (two) times daily between meals. 12/01/16   Janece Canterbury, MD  ferrous sulfate 325 (65 FE) MG tablet Take 1 tablet (325 mg total) by mouth 3 (three) times daily with meals. 12/01/16   Janece Canterbury, MD  folic acid (FOLVITE) 1 MG tablet Take 1 tablet (1 mg total) by mouth daily. 12/02/16   Janece Canterbury, MD  Hydrocortisone (GERHARDT'S BUTT CREAM) CREA Apply 1 application topically 2 (two) times daily. 12/01/16   Janece Canterbury, MD  Maltodextrin-Xanthan Gum ([place]) POWD Add to liquids to  make nectar thick. 12/01/16   Janece Canterbury, MD  metoprolol succinate (TOPROL-XL) 25 MG 24 hr tablet Take 0.5 tablets (12.5 mg total) by mouth daily. 12/02/16   Janece Canterbury, MD  nicotine (NICODERM CQ - DOSED IN MG/24 HOURS) 21 mg/24hr patch Place 1 patch (21 mg total) onto the skin daily. 12/02/16   Janece Canterbury, MD  pantoprazole (PROTONIX) 40 MG tablet Take 1 tablet (40 mg total) by mouth 2 (two) times daily. 12/01/16   Janece Canterbury, MD  polyethylene glycol powder (GLYCOLAX/MIRALAX) powder Take 17  grams in 8 oz of water or juice daily. Patient taking differently: Take 1 Container by mouth daily as needed for mild constipation or moderate constipation. Take 17 grams in 8 oz of water or juice 02/23/15   Zehr, Jessica D, PA-C  thiamine 100 MG tablet Take 1 tablet (100 mg total) by mouth daily. 12/02/16   Janece Canterbury, MD    Family History Family History  Problem Relation Age of Onset  . Heart disease Father 29       cabg    Social History Social History  Substance Use Topics  . Smoking status: Current Every Day Smoker    Packs/day: 1.00    Types: Cigarettes  . Smokeless tobacco: Never Used  . Alcohol use 8.4 oz/week    14 Glasses of wine per week     Allergies   Patient has no known allergies.   Review of Systems Review of Systems  All other systems reviewed and are negative.    Physical Exam Updated Vital Signs BP 104/62   Pulse (!) 132   Temp 98.2 F (36.8 C) (Oral)   Resp 18   Ht 5\' 8"  (1.727 m)   Wt 65.8 kg (145 lb)   SpO2 98%   BMI 22.05 kg/m   Physical Exam  Constitutional: He is oriented to person, place, and time.  Ill appearing male, pale in appearance, disheveled, with coffee ground emesis noted throughout his clothing.    HENT:  Head: Atraumatic.  Eyes: Conjunctivae are normal.  Neck: Neck supple.  Cardiovascular:  Tachycardia without M/R/G  Pulmonary/Chest: Effort normal and breath sounds normal.  Abdominal: Soft. He exhibits no distension. There is tenderness (diffused abd tenderness without guarding or rebound tenderness).  Neurological: He is alert and oriented to person, place, and time.  Skin: No rash noted.  Psychiatric: He has a normal mood and affect.  Nursing note and vitals reviewed.    ED Treatments / Results  Labs (all labs ordered are listed, but only abnormal results are displayed) Labs Reviewed  CBC WITH DIFFERENTIAL/PLATELET - Abnormal; Notable for the following:       Result Value   WBC 19.8 (*)    RBC 2.79  (*)    Hemoglobin 8.9 (*)    HCT 26.5 (*)    Neutro Abs 17.7 (*)    Monocytes Absolute 1.3 (*)    All other components within normal limits  COMPREHENSIVE METABOLIC PANEL - Abnormal; Notable for the following:    CO2 21 (*)    Glucose, Bld 191 (*)    BUN 25 (*)    Creatinine, Ser 1.32 (*)    Calcium 7.5 (*)    Total Protein 5.4 (*)    Albumin 2.6 (*)    ALT 14 (*)    GFR calc non Af Amer 56 (*)    Anion gap 17 (*)    All other components within normal limits  URINALYSIS, ROUTINE W REFLEX  MICROSCOPIC - Abnormal; Notable for the following:    APPearance HAZY (*)    Glucose, UA 50 (*)    Hgb urine dipstick SMALL (*)    Ketones, ur 20 (*)    Bacteria, UA RARE (*)    Squamous Epithelial / LPF 0-5 (*)    All other components within normal limits  ETHANOL - Abnormal; Notable for the following:    Alcohol, Ethyl (B) 21 (*)    All other components within normal limits  I-STAT CG4 LACTIC ACID, ED - Abnormal; Notable for the following:    Lactic Acid, Venous 5.14 (*)    All other components within normal limits  CULTURE, BLOOD (ROUTINE X 2)  CULTURE, BLOOD (ROUTINE X 2)  LIPASE, BLOOD  PROTIME-INR  AMMONIA  POC OCCULT BLOOD, ED  I-STAT TROPONIN, ED  TYPE AND SCREEN    EKG  EKG Interpretation None     ED ECG REPORT   Date: 12/20/2016  Rate: 127  Rhythm: sinus tachycardia  QRS Axis: normal  Intervals: QT prolonged  ST/T Wave abnormalities: nonspecific ST/T changes  Conduction Disutrbances:none  Narrative Interpretation:   Old EKG Reviewed: changes noted  I have personally reviewed the EKG tracing and agree with the computerized printout as noted.   Radiology No results found.  Procedures Procedures (including critical care time)  Medications Ordered in ED Medications - No data to display   Initial Impression / Assessment and Plan / ED Course  I have reviewed the triage vital signs and the nursing notes.  Pertinent labs & imaging results that were  available during my care of the patient were reviewed by me and considered in my medical decision making (see chart for details).     BP 133/83   Pulse (!) 124   Temp 98.2 F (36.8 C) (Oral)   Resp (!) 21   Ht 5\' 8"  (1.727 m)   Wt 65.8 kg (145 lb)   SpO2 100%   BMI 22.05 kg/m    Final Clinical Impressions(s) / ED Diagnoses   Final diagnoses:  None    New Prescriptions New Prescriptions   No medications on file   12:59 PM Hx of alcohol abuse, here for recurrent falls, gen weakness, and recurrent vomit of coffee ground emesis.  Pt found to be in profound tachycardia with HR 130s.  He is ill appearing, pale, having abd pain.  No BRBPR or hematochezia.  Work up initiated. IVF, protonix, and antinausea medication given. Care discussed with Dr. Jeneen Rinks.     1:48 PM Pt is tremulous.  Last ETOH use was yesterday, will place on CIWA protocol.    2:27 PM Appreciate consultation from [employer], spoke with Ellouise Newer PA-C who agrees pt should be admitted by medicine and they will be available for consultation.    3:03 PM Hgb is 8.9.  Baselines Hgb in the 8s.  Hemoccult with only trace of blood.  Pt has elevated lactic acid of 5.14 and elevated WBC of 19.8.  Cr 1.32.  Finding is supportive of GI bleed, likely alcohol induce gastritis. Not likely to be variceal bleed or [place]. However, will initiate Zosyn antibiotic, IVF at 28ml/kg, check blood culture.   Appreciate consultation with [facility] Dr. [place] Pink who agrees to see pt in the ER and will admit for further care.   CRITICAL CARE Performed by: Domenic Moras Total critical care time: 45 minutes Critical care time was exclusive of separately billable procedures and treating other patients. Critical care was necessary  to treat or prevent imminent or life-threatening deterioration. Critical care was time spent personally by me on the following activities: development of treatment plan with patient and/or surrogate as well  as nursing, discussions with consultants, evaluation of patient's response to treatment, examination of patient, obtaining history from patient or surrogate, ordering and performing treatments and interventions, ordering and review of laboratory studies, ordering and review of radiographic studies, pulse oximetry and re-evaluation of patient's condition.    Domenic Moras, PA-C 12/20/16 1512

## 2016-12-20 NOTE — Progress Notes (Signed)
Pharmacy Antibiotic Note  Patrick [name] is a 66 y.o. male with hx EtOH and depression presented to the ED on 12/20/2016 with hematemesis.  To start broad abx with vancomycin and zosyn for suspected PNA.  - Tmax 101.1, wbc 19.8, scr 1.32 (crcl~53), LA 5.14>>1   Plan: - zosyn 3.375 gm IV q8h (infuse over 4 hrs) - vancomycin 500 mg IV q12h - daily scr _______________________________  Height: 5\' 8"  (172.7 cm) Weight: 145 lb (65.8 kg) IBW/kg (Calculated) : 68.4  Temp (24hrs), Avg:99.4 F (37.4 C), Min:98.2 F (36.8 C), Max:101.1 F (38.4 C)   Recent Labs Lab 12/20/16 1251 12/20/16 1313 12/20/16 1824  WBC 19.8*  --   --   CREATININE 1.32*  --   --   LATICACIDVEN  --  5.14* 1.0    Estimated Creatinine Clearance: 53.3 mL/min (A) (by C-G formula based on SCr of 1.32 mg/dL (H)).    No Known Allergies   Thank you for allowing pharmacy to be a part of this patient's care.  Lynelle Doctor 12/20/2016 7:44 PM

## 2016-12-21 ENCOUNTER — Encounter (HOSPITAL_COMMUNITY)
Admission: EM | Discharge status: Against Medical Advice | Payer: Self-pay | Source: Home / Self Care | Attending: Internal Medicine

## 2016-12-21 ENCOUNTER — Inpatient Hospital Stay (HOSPITAL_COMMUNITY): Payer: BLUE CROSS/BLUE SHIELD | Admitting: Anesthesiology

## 2016-12-21 ENCOUNTER — Encounter (HOSPITAL_COMMUNITY): Payer: Self-pay | Admitting: *Deleted

## 2016-12-21 DIAGNOSIS — K2210 Ulcer of esophagus without bleeding: Secondary | ICD-10-CM

## 2016-12-21 DIAGNOSIS — K449 Diaphragmatic hernia without obstruction or gangrene: Secondary | ICD-10-CM

## 2016-12-21 HISTORY — PX: ESOPHAGOGASTRODUODENOSCOPY (EGD) WITH PROPOFOL: SHX5813

## 2016-12-21 LAB — CBC
HCT: 19.4 % — ABNORMAL LOW (ref 39.0–52.0)
HEMOGLOBIN: 6.6 g/dL — AB (ref 13.0–17.0)
MCH: 32.4 pg (ref 26.0–34.0)
MCHC: 34 g/dL (ref 30.0–36.0)
MCV: 95.1 fL (ref 78.0–100.0)
PLATELETS: 181 10*3/uL (ref 150–400)
RBC: 2.04 MIL/uL — AB (ref 4.22–5.81)
RDW: 15.1 % (ref 11.5–15.5)
WBC: 19.6 10*3/uL — AB (ref 4.0–10.5)

## 2016-12-21 LAB — BASIC METABOLIC PANEL
ANION GAP: 9 (ref 5–15)
BUN: 28 mg/dL — ABNORMAL HIGH (ref 6–20)
CALCIUM: 7.6 mg/dL — AB (ref 8.9–10.3)
CO2: 22 mmol/L (ref 22–32)
Chloride: 114 mmol/L — ABNORMAL HIGH (ref 101–111)
Creatinine, Ser: 1.1 mg/dL (ref 0.61–1.24)
GFR calc Af Amer: >60 mL/min (ref >60)
Glucose, Bld: 110 mg/dL — ABNORMAL HIGH (ref 65–99)
Potassium: 3.5 mmol/L (ref 3.5–5.1)
SODIUM: 145 mmol/L (ref 135–145)

## 2016-12-21 LAB — HEMOGLOBIN AND HEMATOCRIT, BLOOD
HCT: 25.4 % — ABNORMAL LOW (ref 39.0–52.0)
Hemoglobin: 8.6 g/dL — ABNORMAL LOW (ref 13.0–17.0)

## 2016-12-21 LAB — PREPARE RBC (CROSSMATCH)

## 2016-12-21 LAB — PREALBUMIN: PREALBUMIN: 6.9 mg/dL — AB (ref 18–38)

## 2016-12-21 SURGERY — ESOPHAGOGASTRODUODENOSCOPY (EGD) WITH PROPOFOL
Anesthesia: Monitor Anesthesia Care

## 2016-12-21 MED ORDER — PROPOFOL 10 MG/ML IV BOLUS
INTRAVENOUS | Status: AC
  Filled 2016-12-21: qty 40

## 2016-12-21 MED ORDER — LIDOCAINE HCL (CARDIAC) 20 MG/ML IV SOLN
INTRAVENOUS | Status: DC | PRN
  Administered 2016-12-21: 100 mg via INTRATRACHEAL

## 2016-12-21 MED ORDER — PROPOFOL 500 MG/50ML IV EMUL
INTRAVENOUS | Status: DC | PRN
  Administered 2016-12-21: 125 ug/kg/min via INTRAVENOUS

## 2016-12-21 MED ORDER — LACTATED RINGERS IV SOLN
INTRAVENOUS | Status: DC | PRN
  Administered 2016-12-21: 12:00:00 via INTRAVENOUS

## 2016-12-21 MED ORDER — LIDOCAINE 2% (20 MG/ML) 5 ML SYRINGE
INTRAMUSCULAR | Status: AC
  Filled 2016-12-21: qty 10

## 2016-12-21 MED ORDER — PANTOPRAZOLE SODIUM 40 MG PO TBEC
40.0000 mg | DELAYED_RELEASE_TABLET | Freq: Two times a day (BID) | ORAL | Status: DC
  Administered 2016-12-21 – 2016-12-23 (×4): 40 mg via ORAL
  Filled 2016-12-21 (×5): qty 1

## 2016-12-21 MED ORDER — SODIUM CHLORIDE 0.9 % IV SOLN
Freq: Once | INTRAVENOUS | Status: AC
  Administered 2016-12-21: 04:00:00 via INTRAVENOUS

## 2016-12-21 MED ORDER — PROPOFOL 10 MG/ML IV BOLUS
INTRAVENOUS | Status: DC | PRN
  Administered 2016-12-21: 50 mg via INTRAVENOUS

## 2016-12-21 SURGICAL SUPPLY — 15 items

## 2016-12-21 NOTE — Anesthesia Postprocedure Evaluation (Signed)
Anesthesia Post Note  Patient: Patrick [name]  Procedure(s) Performed: Procedure(s) (LRB): ESOPHAGOGASTRODUODENOSCOPY (EGD) WITH PROPOFOL (N/A)     Patient location during evaluation: Endoscopy Anesthesia Type: MAC Level of consciousness: awake and alert Pain management: pain level controlled Vital Signs Assessment: post-procedure vital signs reviewed and stable Respiratory status: spontaneous breathing, nonlabored ventilation, respiratory function stable and patient connected to nasal cannula oxygen Cardiovascular status: stable and blood pressure returned to baseline Postop Assessment: no apparent nausea or vomiting Anesthetic complications: no    Last Vitals:  Vitals:   12/21/16 1040 12/21/16 1244  BP: 118/86 (!) 102/48  Pulse: (!) 111 96  Resp: (!) 22 (!) 35  Temp: 36.9 C 36.5 C  SpO2: 100% 99%    Last Pain:  Vitals:   12/21/16 1244  TempSrc: Oral  PainSc:                  Patrick [name]

## 2016-12-21 NOTE — Transfer of Care (Signed)
Immediate Anesthesia Transfer of Care Note  Patient: Patrick [name]  Procedure(s) Performed: Procedure(s): ESOPHAGOGASTRODUODENOSCOPY (EGD) WITH PROPOFOL (N/A)  Patient Location: PACU and Endoscopy Unit  Anesthesia Type:MAC  Level of Consciousness: awake and alert   Airway & Oxygen Therapy: Patient Spontanous Breathing  Post-op Assessment:  Post vital signs: Reviewed and stable  Last Vitals:  Vitals:   12/21/16 1040 12/21/16 1244  BP: 118/86 (!) 80/44  Pulse: (!) 111 96  Resp: (!) 22 (!) 35  Temp: 36.9 C   SpO2: 100% 99%    Last Pain:  Vitals:   12/21/16 1040  TempSrc: Oral  PainSc:          Complications: No apparent anesthesia complications

## 2016-12-21 NOTE — Progress Notes (Signed)
Pt still have black tarry stools with tachycardia.

## 2016-12-21 NOTE — Op Note (Signed)
[facility] Patient Name: Patrick [name] Procedure Date: 12/21/2016 MRN: [id number] Attending MD: Estill Cotta. Loletha Carrow , MD Date of Birth: [birth year] CSN: [id number] Age: 66 Admit Type: Inpatient Procedure:                Upper GI endoscopy Indications:              Acute post hemorrhagic anemia, Hematemesis, alcohol                            abuse Providers:                Mallie Mussel L. Loletha Carrow, MD, Burtis Junes, RN, William Dalton,                            Technician Referring MD:              Medicines:                Monitored Anesthesia Care Complications:            No immediate complications. Estimated Blood Loss:     Estimated blood loss: none. Procedure:                Pre-Anesthesia Assessment:                           - Prior to the procedure, a History and Physical                            was performed, and patient medications and                            allergies were reviewed. The patient's tolerance of                            previous anesthesia was also reviewed. The risks                            and benefits of the procedure and the sedation                            options and risks were discussed with the patient.                            All questions were answered, and informed consent                            was obtained. Prior Anticoagulants: The patient has                            taken no previous anticoagulant or antiplatelet                            agents. ASA Grade Assessment: III - A patient with                            severe  systemic disease. After reviewing the risks                            and benefits, the patient was deemed in                            satisfactory condition to undergo the procedure.                           After obtaining informed consent, the endoscope was                            passed under direct vision. Throughout the                            procedure, the patient's blood pressure,  pulse, and                            oxygen saturations were monitored continuously. The                            [id number]) scope was introduced through the                            mouth, and advanced to the second part of duodenum.                            The upper GI endoscopy was accomplished without                            difficulty. The patient tolerated the procedure                            well. Scope In: Scope Out: Findings:      LA Grade D (one or more mucosal breaks involving at least 75% of       esophageal circumference) esophagitis with no bleeding was found in the       entire esophagus.      A 10 cm hiatal hernia was present. No erosions were seen within it.      The stomach was normal.      The cardia and gastric fundus were normal on retroflexion.      The examined duodenum was normal. Impression:               - LA Grade D reflux esophagitis. This is the source                            of coffee grounds emesis.                           - 10 cm hiatal hernia.                           - Normal stomach.                           -  Normal examined duodenum.                           - No specimens collected. Moderate Sedation:      MAC sedation used Recommendation:           - Return patient to hospital ward for ongoing care.                           - Resume regular diet.                           - Medication reconciliation was performed on this                            inpatient.                           Twice daily oral PPI                           Elevate HOB                           Alcohol abstinence. Procedure Code(s):        --- Professional ---                           [phone], Esophagogastroduodenoscopy, flexible,                            transoral; diagnostic, including collection of                            specimen(s) by brushing or washing, when performed                            (separate procedure) Diagnosis  Code(s):        --- Professional ---                           K21.0, Gastro-esophageal reflux disease with                            esophagitis                           K44.9, Diaphragmatic hernia without obstruction or                            gangrene                           D62, Acute posthemorrhagic anemia                           K92.0, Hematemesis CPT copyright 2016 American Medical Association. All rights reserved. The codes documented in this report are preliminary and upon coder review may  be revised to meet current compliance requirements. Tillmon Kisling L. Loletha Carrow, MD  12/21/2016 12:44:00 PM This report has been signed electronically. Number of Addenda: 0

## 2016-12-21 NOTE — Anesthesia Preprocedure Evaluation (Signed)
Anesthesia Evaluation  Patient identified by MRN, date of birth, ID band Patient awake    Reviewed: Allergy & Precautions, NPO status , Patient's Chart, lab work & pertinent test results  Airway Mallampati: II  TM Distance: >3 FB Neck ROM: Full    Dental no notable dental hx.    Pulmonary Current Smoker,    Pulmonary exam normal breath sounds clear to auscultation       Cardiovascular hypertension, Pt. on medications negative cardio ROS Normal cardiovascular exam Rhythm:Regular Rate:Normal     Neuro/Psych negative neurological ROS  negative psych ROS   GI/Hepatic negative GI ROS, (+)     substance abuse  alcohol use,   Endo/Other  negative endocrine ROS  Renal/GU negative Renal ROS  negative genitourinary   Musculoskeletal negative musculoskeletal ROS (+)   Abdominal   Peds negative pediatric ROS (+)  Hematology negative hematology ROS (+)   Anesthesia Other Findings   Reproductive/Obstetrics negative OB ROS                             Anesthesia Physical Anesthesia Plan  ASA: III  Anesthesia Plan: MAC   Post-op Pain Management:    Induction:   PONV Risk Score and Plan: 0  Airway Management Planned: Nasal Cannula  Additional Equipment:   Intra-op Plan:   Post-operative Plan:   Informed Consent: I have reviewed the patients History and Physical, chart, labs and discussed the procedure including the risks, benefits and alternatives for the proposed anesthesia with the patient or authorized representative who has indicated his/her understanding and acceptance.   Dental advisory given  Plan Discussed with: CRNA  Anesthesia Plan Comments:         Anesthesia Quick Evaluation

## 2016-12-21 NOTE — Interval H&P Note (Signed)
History and Physical Interval Note:  12/21/2016 12:09 PM  Patrick [name]  has presented today for surgery, with the diagnosis of Coffee-ground emesis  The various methods of treatment have been discussed with the patient and family. After consideration of risks, benefits and other options for treatment, the patient has consented to  Procedure(s): ESOPHAGOGASTRODUODENOSCOPY (EGD) WITH PROPOFOL (N/A) as a surgical intervention .  The patient's history has been reviewed, patient examined, no change in status, stable for surgery.  I have reviewed the patient's chart and labs.  Questions were answered to the patient's satisfaction.     Hgb improved after transfusion.  Nelida Meuse III

## 2016-12-21 NOTE — H&P (View-Only) (Signed)
[facility] Consult Note   History NHIA HEAPHY MRN # [id number]  Date of Admission: 12/20/2016 Date of Consultation: 12/20/2016 Referring physician: Dr. Annita Brod, MD Primary Care Provider: [name], MD Primary Gastroenterologist: Dr. Silvano Rusk   Reason for Consultation/Chief Complaint: hematemesis  Subjective  HPI:  This is a 66 year old [name] last seen by my partner Dr. Carlean Purl in October 2016, at which time an upper endoscopy revealed severe esophagitis. This patient drinks alcohol heavily, and was found on the floor at home today having vomited a large amount of coffee grounds material. Apparently, his ex-wife had not heard from him in several days and called EMS. He does not seem to have much recollection of the last several days. He reports that his stomach feels "sick", and he cannot characterize it any further. He recalls vomiting last evening and into early this morning. He did not realize that the coffee grounds material was probably blood.  He has had 2 drinks of gin on average per day for many years. This has continued right until yesterday. He does not recall any previous episodes of hematemesis. He does not believe that he has passed bloody or black stool in the last several days. He also does not think there is been any recent weight loss.  At the time of admission in the ED he is found to be volume depleted, tachycardic with a lactic acidosis of 5.1. He is currently alert and mentating well, interactive and appropriate.  He does not believe he has had a bowel movement" a few days".  ROS:  Constitutional: generalized fatigue Eyes:   No visual change Cardiovascular:  no Chest pain Respiratory: no  dyspnea   All other systems are negative except as noted above in the HPI  Past Medical History Past Medical History:  Diagnosis Date  . Colon polyps   . Depression   . Diverticulitis   . Diverticulosis of colon   . GERD  (gastroesophageal reflux disease)   . Kidney stone     Past Surgical History Past Surgical History:  Procedure Laterality Date  . COLONOSCOPY    . UPPER GASTROINTESTINAL ENDOSCOPY      Family History Family History  Problem Relation Age of Onset  . Heart disease Father 2       cabg    Social History Social History   Social History  . Marital status: Divorced    Spouse name: N/A  . Number of children: N/A  . Years of education: N/A   Social History Main Topics  . Smoking status: Current Every Day Smoker    Packs/day: 1.00    Types: Cigarettes  . Smokeless tobacco: Never Used  . Alcohol use 8.4 oz/week    14 Glasses of wine per week  . Drug use: No  . Sexual activity: No   Other Topics Concern  . None   Social History Narrative  . None    Allergies No Known Allergies  Outpatient Meds Home medications from the H+P and/or nursing med reconciliation reviewed.  Inpatient med list reviewed The ER is currently given a Protonix drip and a dose of Zosyn 3.375 g out of concern for possible intra-abdominal infection with the patient's acute GI presentation and lactic acidosis _____________________________________________________________________ Objective   Exam:  Current vital signs  Patient Vitals for the past 8 hrs:  BP Temp Temp src Pulse Resp SpO2 Height Weight  12/20/16 1400 133/83 - - (!) 124 (!) 21 100 % - -  12/20/16 1338 (!) 143/98 - - (!) 132 16 97 % - -  12/20/16 1330 (!) 143/98 - - (!) 127 20 100 % - -  12/20/16 1301 (!) 144/94 - - (!) 131 16 100 % - -  12/20/16 1300 (!) 144/94 - - (!) 131 - 100 % - -  12/20/16 1201 - - - - - - 5\' 8"  (1.727 m) 145 lb (65.8 kg)  12/20/16 1200 (!) 130/97 98.2 F (36.8 C) Oral (!) 130 18 100 % - -    Intake/Output Summary (Last 24 hours) at 12/20/16 1600 Last data filed at 12/20/16 1440  Gross per 24 hour  Intake             3100 ml  Output                0 ml  Net             3100 ml    Physical  Exam:    General: this is a disheveled male patient in no acute distress. Poor muscle mass  Eyes: sclera anicteric, no redness  ENT: oral mucosa dry and without lesions, no cervical or supraclavicular lymphadenopathy, poor dentition, there is some residual black material on his tongue  CV: RRR without murmur, S1/S2, no JVD,, no peripheral edema  Resp: clear to auscultation bilaterally, normal RR and effort noted  GI: soft, no tenderness, with active but high-pitched bowel sounds. No guarding or palpable organomegaly noted  Skin; warm and dry, no rash or jaundice noted, pale  Neuro: awake, alert and oriented x 3. Normal gross motor function and fluent speech.  Labs:   Recent Labs Lab 12/20/16 1251  WBC 19.8*  HGB 8.9*  HCT 26.5*  PLT 315    Recent Labs Lab 12/20/16 1251  NA 142  K 3.5  CL 104  CO2 21*  BUN 25*  ALBUMIN 2.6*  ALKPHOS 66  ALT 14*  AST 28  GLUCOSE 191*    Recent Labs Lab 12/20/16 1251  INR 1.19   alcohol level 21 Lipase 19 Lactic acid 5.14  Radiologic studies: I have personally reviewed his abdominal and pelvic CT scan with the radiologist. There is a large hiatal hernia, and the stomach is distended with a large amount of liquid. There is collapse of the most distal stomach, but it is difficult to tell if this is from mucosal abnormality or peristalsis. Gallstones are seen, which were present on an ultrasound 2 years ago. There is also nonspecific gallbladder wall thickening without surrounding inflammation.Diverticulosis without surrounding inflammation is seen. There is no small or large bowel obstruction.  @ASSESSMENTPLANBEGIN @ Impression:  Hematemesis Anemia of acute GI blood loss, and there is also almost certainly a significant chronic component from alcohol abuse and marrow suppression Alcohol abuse, He does not have exam, lab or radiographic findings of cirrhosis. Lactic acidosis Leukocytosis of unclear cause, currently no  definite focus of infection, and I think antibiotics can be discontinued. 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 The findings of the distal stomach on imaging are of unclear nature, but he definitely needs an upper endoscopy when he is volume resuscitated and his acidemia is improved.  Then we can determine if there are any significant gastric findings and what seems to be the source of bleeding.  He does not appear to be having a brisk GI bleed, and I do not think he  has esophageal or gastric varices.   Plan:   nothing by mouth except ice chips Discontinue antibiotics unless there is a convincing source of infection Continue Protonix drip Serial hemoglobin and hematocrit every 8 hours Upper endoscopy tomorrow. Watch closely for signs of alcohol withdrawal  This patient is at increased risk of complications from endoscopic procedure due to his alcohol abuse and malnutrition  Thank you for the courtesy of this consult.  Please contact me with any questions or concerns.  Nelida Meuse III Pager: [phone] Mon-Fri 8a-5p [phone] after 5p, weekends, holidays

## 2016-12-21 NOTE — Care Management Note (Signed)
Case Management Note  Patient Details  Name: Patrick [name] MRN: [id number] Date of Birth: 11-28-1950  Subjective/Objective:                  Gi bleed and anemia  Action/Plan: Date:  December 21, 2016 Chart reviewed for concurrent status and case management needs. Will continue to follow patient progress. Discharge Planning: following for needs Expected discharge date: [id number] Velva Harman, BSN, [place], [place]  Expected Discharge Date:   (unknown)               Expected Discharge Plan:  [facility]  In-House Referral:  Clinical Social Work  Discharge planning Services  CM Consult  Post Acute Care Choice:    Choice offered to:     DME Arranged:    DME Agency:     HH Arranged:    [place] Agency:     Status of Service:  In process, will continue to follow  If discussed at Long Length of Stay Meetings, dates discussed:    Additional Comments:  Leeroy Cha, RN 12/21/2016, 8:26 AM

## 2016-12-21 NOTE — Progress Notes (Addendum)
PROGRESS NOTE        PATIENT DETAILS Name: Patrick [name] Age: 66 y.o. Sex: male Date of Birth: 1950/11/07 Admit Date: 12/20/2016 Admitting Physician Annita Brod, MD [id number], Alinda Sierras, MD  Brief Narrative: Patient is a 66 y.o. male with long-standing history of alcohol abuse, depression admitted with upper GI bleeding and associated acute blood loss anemia. See below for further details  Subjective: Drowsy/sleepy this morning-easily wakes up but then goes back to sleep-follows commands. Per RN-had numerous episodes of melanotic stools overnight. He required 1 unit of PRBC transfusion overnight.  Assessment/Plan: Upper GI bleed: Continues to have melena-Continue PPI infusion-await results of endoscopy. Follow CBC and transfuse accordingly.  Acute blood loss anemia: Secondary to above. Significant drop of hemoglobin this morning-transfused 1 unit of PRBC so far-follow CBC every 8 hours for now, and transfuse accordingly.  Alcohol abuse: Drowsy-but no major withdrawal symptoms at present, continue with Ativan per protocol. Keep in step down for another 24 hours-high risk for delirium tremens.  ? Atrial flutter: Do not think that the EKG on admission was consistent with atrial flutter-there were a lot of artifacts-EKG this morning shows sinus rhythm. In any event he is not a candidate for anticoagulation. Continue telemetry monitoring  Acute kidney injury: Hemodynamically mediated in a setting of GI loss, improving with gentle hydration and other supportive measures.  Hypertension: Blood pressure continues to be soft-continue to hold metoprolol.  Fever: UA/chest x-ray negative for infection-no other foci of infection apparent-although given history of alcohol use-he may have microvascular aspirated. Continue empiric antimicrobial therapy-and follow clinical course and cultures.  Fatty infiltration of the liver with ill-defined 139 mm lesion in the  right lobe seen on CT scan of the abdomen: When he is more stable, we can pursue a MRI  Ethics: Alcoholic-lives alone-suspect has poor social/living conditions-social worker evaluation, PT evaluation once he is more awake.  Telemetry (independently reviewed):Sinus Tach  Echo (reviewed):EF 60-65% on TTE done on 8/22  Old records review: prior d/c summary on 12/01/16-involuntarily committed by family members due to inability to take care of himself, hospital course complicated by melena, dysphagia.  Morning labs/Imaging ordered: yes  DVT Prophylaxis: SCD's  Code Status: Full code   Family Communication: None at bedside  Disposition Plan: Remain inpatient-may need SNF on discharge.  Antimicrobial agents: Anti-infectives    Start     Dose/Rate Route Frequency Ordered Stop   12/20/16 2200  [MAR Hold]  piperacillin-tazobactam (ZOSYN) IVPB 3.375 g     (MAR Hold since 12/21/16 1036)   3.375 g 12.5 mL/hr over 240 Minutes Intravenous Every 8 hours 12/20/16 1952     12/20/16 2000  [MAR Hold]  vancomycin (VANCOCIN) 500 mg in sodium chloride 0.9 % 100 mL IVPB     (MAR Hold since 12/21/16 1036)   500 mg 100 mL/hr over 60 Minutes Intravenous Every 12 hours 12/20/16 1952     12/20/16 1500  piperacillin-tazobactam (ZOSYN) IVPB 3.375 g     3.375 g 100 mL/hr over 30 Minutes Intravenous  Once 12/20/16 1458 12/20/16 1614      Procedures: 9/13>> EGD scheduled  CONSULTS:  GI  Time spent: 40 minutes-Greater than 50% of this time was spent in counseling, explanation of diagnosis, planning of further management, and coordination of care.  MEDICATIONS: Scheduled Meds: . [MAR Hold] folic acid  1 mg Oral  Daily  . [MAR Hold] LORazepam  0-4 mg Intravenous Q6H   Followed by  . [MAR Hold] LORazepam  0-4 mg Intravenous Q12H  . [MAR Hold] multivitamin with minerals  1 tablet Oral Daily  . [MAR Hold] nicotine  21 mg Transdermal Daily  . [MAR Hold] pantoprazole  40 mg Intravenous Q12H  . [MAR  Hold] thiamine  100 mg Oral Daily   Or  . [MAR Hold] thiamine  100 mg Intravenous Daily   Continuous Infusions: . sodium chloride Stopped (12/21/16 0052)  . sodium chloride 100 mL/hr at 12/21/16 0000  . sodium chloride Stopped (12/21/16 0052)  . pantoprozole (PROTONIX) infusion 8 mg/hr (12/21/16 0223)  . [MAR Hold] piperacillin-tazobactam (ZOSYN)  IV 3.375 g (12/21/16 0630)  . [MAR Hold] vancomycin Stopped (12/21/16 0904)   PRN Meds:.[MAR Hold] albuterol, [DISCONTINUED] LORazepam **OR** [MAR Hold] LORazepam, [MAR Hold] ondansetron **OR** [MAR Hold] ondansetron (ZOFRAN) IV   PHYSICAL EXAM: Vital signs: Vitals:   12/21/16 0615 12/21/16 0700 12/21/16 0800 12/21/16 1040  BP:  109/70  118/86  Pulse: (!) 111 (!) 102  (!) 111  Resp: (!) 22 20  (!) 22  Temp: 98.4 F (36.9 C)  99.2 F (37.3 C) 98.5 F (36.9 C)  TempSrc: Oral  Oral Oral  SpO2: 100% 99%  100%  Weight:      Height:       Filed Weights   12/20/16 1201  Weight: 65.8 kg (145 lb)   Body mass index is 22.05 kg/m.   General appearance:Drowsy/sleepy-easily arouses-follow some commands-answers yes or no. Eyes:, pupils equally reactive to light and accomodation,no scleral icterus. HEENT: Atraumatic and Normocephalic Neck: supple, no JVD. No cervical lymphadenopathy. No thyromegaly Resp:Good air entry bilaterally CVS: S1 S2 regular GI: Bowel sounds present, Non tender and not distended with no gaurding, rigidity or rebound.No organomegaly Extremities: B/L Lower Ext shows no edema, both legs are warm to touch Neurology:  Moves all 4 extremities-difficult exam. Psychiatric: Drowsy/sleepy-unable to evaluate Musculoskeletal:No digital cyanosis Skin:No Rash, warm and dry Wounds:N/A  I have personally reviewed following labs and imaging studies  LABORATORY DATA: CBC:  Recent Labs Lab 12/20/16 1251 12/20/16 2154 12/21/16 0310 12/21/16 0705  WBC 19.8*  --  19.6*  --   NEUTROABS 17.7*  --   --   --   HGB 8.9* 7.5*  6.6* 8.6*  HCT 26.5* 21.6* 19.4* 25.4*  MCV 95.0  --  95.1  --   PLT 315  --  181  --     Basic Metabolic Panel:  Recent Labs Lab 12/20/16 1251 12/21/16 0310  NA 142 145  K 3.5 3.5  CL 104 114*  CO2 21* 22  GLUCOSE 191* 110*  BUN 25* 28*  CREATININE 1.32* 1.10  CALCIUM 7.5* 7.6*    GFR: Estimated Creatinine Clearance: 64 mL/min (by C-G formula based on SCr of 1.1 mg/dL).  Liver Function Tests:  Recent Labs Lab 12/20/16 1251  AST 28  ALT 14*  ALKPHOS 66  BILITOT 0.7  PROT 5.4*  ALBUMIN 2.6*    Recent Labs Lab 12/20/16 1251  LIPASE 18    Recent Labs Lab 12/20/16 1252  AMMONIA 13    Coagulation Profile:  Recent Labs Lab 12/20/16 1251  INR 1.19    Cardiac Enzymes: No results for input(s): CKTOTAL, CKMB, CKMBINDEX, TROPONINI in the last 168 hours.  BNP (last 3 results) No results for input(s): PROBNP in the last 8760 hours.  HbA1C:  Recent Labs  12/20/16 1824  HGBA1C  5.2    CBG: No results for input(s): GLUCAP in the last 168 hours.  Lipid Profile: No results for input(s): CHOL, HDL, LDLCALC, TRIG, CHOLHDL, LDLDIRECT in the last 72 hours.  Thyroid Function Tests: No results for input(s): TSH, T4TOTAL, FREET4, T3FREE, THYROIDAB in the last 72 hours.  Anemia Panel: No results for input(s): VITAMINB12, FOLATE, FERRITIN, TIBC, IRON, RETICCTPCT in the last 72 hours.  Urine analysis:    Component Value Date/Time   COLORURINE YELLOW 12/20/2016 1251   APPEARANCEUR HAZY (A) 12/20/2016 1251   LABSPEC 1.013 12/20/2016 1251   PHURINE 5.0 12/20/2016 1251   GLUCOSEU 50 (A) 12/20/2016 1251   HGBUR SMALL (A) 12/20/2016 1251   HGBUR negative 02/21/2010 1055   BILIRUBINUR NEGATIVE 12/20/2016 1251   BILIRUBINUR n 03/11/2014 1025   KETONESUR 20 (A) 12/20/2016 1251   PROTEINUR NEGATIVE 12/20/2016 1251   UROBILINOGEN 0.2 03/11/2014 1025   UROBILINOGEN 0.2 02/21/2010 1055   NITRITE NEGATIVE 12/20/2016 1251   LEUKOCYTESUR NEGATIVE 12/20/2016  1251    Sepsis Labs: Lactic Acid, Venous    Component Value Date/Time   LATICACIDVEN 1.7 12/20/2016 2154    MICROBIOLOGY: Recent Results (from the past 240 hour(s))  Blood culture (routine x 2)     Status: None (Preliminary result)   Collection Time: 12/20/16  3:14 PM  Result Value Ref Range Status   Specimen Description BLOOD LEFT HAND  Final   Special Requests IN PEDIATRIC BOTTLE Blood Culture adequate volume  Final   Culture   Final    NO GROWTH < 24 HOURS Performed at [facility], [place] [address] [place], [place] [address]    Report Status PENDING  Incomplete  Blood culture (routine x 2)     Status: None (Preliminary result)   Collection Time: 12/20/16  3:33 PM  Result Value Ref Range Status   Specimen Description BLOOD RIGHT HAND  Final   Special Requests   Final    BOTTLES DRAWN AEROBIC AND ANAEROBIC Blood Culture adequate volume   Culture   Final    NO GROWTH < 24 HOURS Performed at [facility], [place] [address], [place],  [address]    Report Status PENDING  Incomplete  MRSA PCR Screening     Status: None   Collection Time: 12/20/16  5:46 PM  Result Value Ref Range Status   MRSA by PCR NEGATIVE NEGATIVE Final    Comment:        The GeneXpert MRSA Assay (FDA approved for NASAL specimens only), is one component of a comprehensive MRSA colonization surveillance program. It is not intended to diagnose MRSA infection nor to guide or monitor treatment for MRSA infections.     RADIOLOGY STUDIES/RESULTS: Dg Chest 2 View  Result Date: 11/25/2016 CLINICAL DATA:  Cough and wheezing.  Hypotensive. EXAM: CHEST  2 VIEW COMPARISON:  Chest radiograph April 26, 2011 FINDINGS: Increased lung volumes with apical bullous changes. Cardiac silhouette is normal in size. Mediastinal silhouette is nonsuspicious, mildly calcified aortic knob. Small to moderate hiatal hernia. No pleural effusion or focal consolidation. No pneumothorax. Soft tissue planes and  included osseous structures are nonsuspicious. IMPRESSION: COPD, no acute cardiopulmonary process. Small to moderate hiatal hernia. Electronically Signed   By: Elon Alas M.D.   On: 11/25/2016 03:14   Ct Angio Chest Pe W Or Wo Contrast  Result Date: 11/29/2016 CLINICAL DATA:  Cough, shortness of breath. Suspect pulmonary embolism. History of smoking, alcohol abuse and erosive esophagitis. EXAM: CT ANGIOGRAPHY CHEST  WITH CONTRAST TECHNIQUE: Multidetector CT imaging of the chest was performed using the standard protocol during bolus administration of intravenous contrast. Multiplanar CT image reconstructions and MIPs were obtained to evaluate the vascular anatomy. CONTRAST:  170 cc Isovue 370 COMPARISON:  Chest radiograph November 29, 2016 at 1052 hours FINDINGS: Patient was coughing during examination resulting in moderate motion artifact. Examination with re-attempted without significant image quality improvement. CARDIOVASCULAR: Suboptimal contrast opacification of the pulmonary artery's (200 Hounsfield units, target is 250 Hounsfield units. Main pulmonary artery is not enlarged. No pulmonary arterial filling defects to the level of the segmental branches. Heart size is normal, no right heart strain. No pericardial effusion. Thoracic aorta is normal course and caliber, mild calcific atherosclerosis. MEDIASTINUM/NODES: No lymphadenopathy by CT size criteria. Patulous fluid-filled esophagus. LUNGS/PLEURA: Tracheobronchial tree is patent, no pneumothorax. Mild centrilobular emphysema. Apical bullous changes. Strandy densities RIGHT lower lobe. UPPER ABDOMEN: Included view of the abdomen is nonacute. Moderate hiatal hernia. Hepatic steatosis. MUSCULOSKELETAL: Visualized soft tissues and included osseous structures appear nonacute. Old posterior RIGHT rib fractures. Moderate included cervical spondylosis. Review of the MIP images confirms the above findings. IMPRESSION: 1. No acute pulmonary embolism on this  moderately motion degraded examination. 2. RIGHT lower lobe atelectasis.  Mild centrilobular emphysema. 3. Patulous fluid-filled esophagus with moderate hiatal hernia, aspiration risk. Aortic Atherosclerosis (ICD10-I70.0) and Emphysema (ICD10-J43.9). Electronically Signed   By: Elon Alas M.D.   On: 11/29/2016 19:43   Ct Abdomen Pelvis W Contrast  Result Date: 12/20/2016 CLINICAL DATA:  [place] when trying to answer the door, covered in coffee-ground emesis, abdominal pain starting yesterday EXAM: CT ABDOMEN AND PELVIS WITH CONTRAST TECHNIQUE: Multidetector CT imaging of the abdomen and pelvis was performed using the standard protocol following bolus administration of intravenous contrast. Sagittal and coronal MPR images reconstructed from axial data set. CONTRAST:  125mL ISOVUE-300 IOPAMIDOL (ISOVUE-300) INJECTION 61% IV. Water given as oral contrast. COMPARISON:  None FINDINGS: Lower chest: Minimal atelectasis at RIGHT lung base. Hepatobiliary: Diffuse fatty infiltration of liver. Nonspecific 13 x 9 mm low-attenuation lesion RIGHT lobe liver, slightly ill-defined, without enhancement or washout. Liver otherwise unremarkable. Dependent density in gallbladder question small gallstone. Pancreas: Normal appearance Spleen: Normal appearance Adrenals/Urinary Tract: Adrenal glands normal appearance. BILATERAL renal cysts largest lateral mid RIGHT kidney 3.0 x 2.8 cm image 28. Kidneys, ureters and bladder otherwise normal appearance Stomach/Bowel: Moderate sized hiatal hernia. Stomach distended by fluid. Wall thickening of the pylorus, cannot exclude component of gastric outlet obstruction. No adjacent infiltrative changes to suggest penetrating ulcer disease. Descending and sigmoid diverticulosis without evidence of diverticulitis. Normal appendix. Small bowel loops unremarkable. Vascular/Lymphatic: Atherosclerotic calcifications aorta and iliac arteries without aneurysm. No adenopathy. Reproductive: Prostatic  calcifications and minimal enlargement. Unremarkable seminal vesicles. Other: No free air or free fluid. No definite inflammatory process. No hernia. Musculoskeletal: Diffuse osseous demineralization. IMPRESSION: Distended stomach with a moderate sized hiatal hernia and thickening of the pylorus raising question of gastric outlet obstruction. Distal colonic diverticulosis without evidence of diverticulitis. Fatty infiltration of liver with a nonspecific slightly ill-defined 13 x 9 mm lesion in the RIGHT lobe ; followup nonemergent characterization by MR imaging recommended. Cholelithiasis. Electronically Signed   By: Lavonia Dana M.D.   On: 12/20/2016 15:18   Dg Chest Port 1 View  Result Date: 12/20/2016 CLINICAL DATA:  Fever.  History of tobacco abuse. EXAM: PORTABLE CHEST 1 VIEW COMPARISON:  Chest radiograph November 29, 2016 FINDINGS: Cardiac silhouette is mildly enlarged. Mediastinal silhouette is nonsuspicious, calcified aortic knob. Calcified  aortic knob. No pleural effusion or focal consolidation. Increased lung volumes. No pneumothorax. Moderate hiatal hernia. Old RIGHT rib fractures. Soft tissue planes are nonsuspicious. IMPRESSION: 1. Mild cardiomegaly and COPD. 2. Moderate hiatal hernia. Electronically Signed   By: Elon Alas M.D.   On: 12/20/2016 21:39   Dg Chest Port 1 View  Result Date: 11/29/2016 CLINICAL DATA:  Wheezing EXAM: PORTABLE CHEST 1 VIEW COMPARISON:  11/25/2016 FINDINGS: Heart size upper limits of normal. Aortic atherosclerosis. Hiatal hernia present. Question hazy infiltrate right lower lobe. The remainder the chest is clear. No effusions. No significant bone finding. IMPRESSION: Question AC pneumonia right lower lobe. Aortic atherosclerosis. Hiatal hernia. Electronically Signed   By: Nelson Chimes M.D.   On: 11/29/2016 11:18   Dg Swallowing Func-speech Pathology  Result Date: 11/29/2016 Objective Swallowing Evaluation: Type of Study: MBS-Modified Barium Swallow Study  Patient Details Name: JOHNCHARLES FUSSELMAN MRN: [id number] Date of Birth: [birth year] Today's Date: 11/29/2016 Time: SLP Start Time (ACUTE ONLY): 0845-SLP Stop Time (ACUTE ONLY): 0915 SLP Time Calculation (min) (ACUTE ONLY): 30 min Past Medical History: Past Medical History: Diagnosis Date . Colon polyps  . Depression  . Diverticulitis  . Diverticulosis of colon  . GERD (gastroesophageal reflux disease)  . Kidney stone  Past Surgical History: Past Surgical History: Procedure Laterality Date . COLONOSCOPY   . UPPER GASTROINTESTINAL ENDOSCOPY   HPI: 66 yo male adm to [facility]  - found at home where he had been sitting for multiple days in his urine.  Pt has h/o etoh use, GERD and is for involuntary commitment due to concerns for his well being.  RN noted pt was coughing with intake = and swallow evaluation was ordered.  Pt states he coughs all the time.  MBS indicatd to allow instrumental eval and determine least restrictive diet.  Subjective: pt awake in chair` Assessment / Plan / Recommendation CHL IP CLINICAL IMPRESSIONS 11/29/2016 Clinical Impression Patient presents with moderate oropharyngeal dysphagia with sensorimotor deficits.  Decreased oral control characterized with lingual rocking - delayed oral transiting and premature spillage noted.  Pharyngeal swallow was timely but decreased tongue base retraction/epiglottic deflection results in vallecular residuals without patient awareness.  Pt did have aspiration of thin via cup before the swallow due to decreased timing of laryngeal closure resulting in cough.  Of note patient did NOT cough before during or after MBS except during aspiration event.  Dry swallow help to decrease vallecular residuals but patient requires cues to conduct them.  Strict aspiration precautions recommended.  Using live images, educated patient to findings/recommendations.  Advised him to strengthen his cough/hock.  SLP Visit Diagnosis Dysphagia, oropharyngeal phase (R13.12) Attention and concentration  deficit following -- Frontal lobe and executive function deficit following -- Impact on safety and function Moderate aspiration risk   CHL IP TREATMENT RECOMMENDATION 11/29/2016 Treatment Recommendations Therapy as outlined in treatment plan below   Prognosis 11/29/2016 Prognosis for Safe Diet Advancement Guarded Barriers to Reach Goals Severity of deficits Barriers/Prognosis Comment -- CHL IP DIET RECOMMENDATION 11/29/2016 SLP Diet Recommendations Dysphagia 1 (Puree) solids;Nectar thick liquid Liquid Administration via Cup;Straw Medication Administration Crushed with puree Compensations Slow rate;Small sips/bites;Multiple dry swallows after each bite/sip;Follow solids with liquid;Effortful swallow Postural Changes Remain semi-upright after after feeds/meals (Comment);Seated upright at 90 degrees   CHL IP OTHER RECOMMENDATIONS 11/29/2016 Recommended Consults -- Oral Care Recommendations Oral care BID Other Recommendations Order thickener from pharmacy   CHL IP FOLLOW UP RECOMMENDATIONS 11/29/2016 Follow up Recommendations None   CHL IP FREQUENCY AND  DURATION 11/29/2016 Speech Therapy Frequency (ACUTE ONLY) min 2x/week Treatment Duration 1 week      CHL IP ORAL PHASE 11/29/2016 Oral Phase Impaired Oral - Pudding Teaspoon -- Oral - Pudding Cup -- Oral - Honey Teaspoon -- Oral - Honey Cup -- Oral - Nectar Teaspoon Weak lingual manipulation;Lingual pumping;Reduced posterior propulsion;Delayed oral transit;Decreased bolus cohesion;Premature spillage Oral - Nectar Cup Weak lingual manipulation;Lingual pumping;Reduced posterior propulsion;Decreased bolus cohesion;Premature spillage Oral - Nectar Straw Weak lingual manipulation;Lingual pumping;Delayed oral transit;Decreased bolus cohesion;Premature spillage Oral - Thin Teaspoon Weak lingual manipulation;Lingual pumping;Delayed oral transit;Decreased bolus cohesion;Premature spillage Oral - Thin Cup Weak lingual manipulation;Lingual pumping;Delayed oral transit;Decreased bolus  cohesion;Premature spillage Oral - Thin Straw -- Oral - Puree Weak lingual manipulation;Lingual pumping;Reduced posterior propulsion;Decreased bolus cohesion;Premature spillage Oral - Mech Soft Weak lingual manipulation;Lingual pumping;Reduced posterior propulsion;Piecemeal swallowing;Delayed oral transit;Decreased bolus cohesion;Premature spillage Oral - Regular -- Oral - Multi-Consistency -- Oral - Pill -- Oral Phase - Comment --  CHL IP PHARYNGEAL PHASE 11/29/2016 Pharyngeal Phase Impaired Pharyngeal- Pudding Teaspoon -- Pharyngeal -- Pharyngeal- Pudding Cup -- Pharyngeal -- Pharyngeal- Honey Teaspoon -- Pharyngeal -- Pharyngeal- Honey Cup -- Pharyngeal -- Pharyngeal- Nectar Teaspoon Reduced epiglottic inversion;Reduced laryngeal elevation;Reduced airway/laryngeal closure;Reduced tongue base retraction;Pharyngeal residue - valleculae Pharyngeal -- Pharyngeal- Nectar Cup Reduced pharyngeal peristalsis;Reduced epiglottic inversion;Reduced tongue base retraction;Pharyngeal residue - valleculae Pharyngeal -- Pharyngeal- Nectar Straw Reduced epiglottic inversion;Reduced anterior laryngeal mobility;Reduced laryngeal elevation;Reduced airway/laryngeal closure;Reduced tongue base retraction;Pharyngeal residue - valleculae Pharyngeal -- Pharyngeal- Thin Teaspoon Reduced epiglottic inversion;Reduced anterior laryngeal mobility;Reduced laryngeal elevation;Reduced airway/laryngeal closure;Reduced tongue base retraction;Pharyngeal residue - valleculae Pharyngeal -- Pharyngeal- Thin Cup Reduced epiglottic inversion;Reduced anterior laryngeal mobility;Reduced laryngeal elevation;Reduced airway/laryngeal closure;Reduced tongue base retraction;Pharyngeal residue - valleculae Pharyngeal -- Pharyngeal- Thin Straw Reduced epiglottic inversion;Reduced anterior laryngeal mobility;Reduced airway/laryngeal closure;Reduced tongue base retraction;Pharyngeal residue - valleculae Pharyngeal -- Pharyngeal- Puree Reduced epiglottic  inversion;Reduced anterior laryngeal mobility;Reduced laryngeal elevation;Reduced airway/laryngeal closure Pharyngeal -- Pharyngeal- Mechanical Soft Reduced epiglottic inversion;Reduced anterior laryngeal mobility;Reduced laryngeal elevation;Reduced tongue base retraction;Pharyngeal residue - valleculae Pharyngeal -- Pharyngeal- Regular -- Pharyngeal -- Pharyngeal- Multi-consistency -- Pharyngeal -- Pharyngeal- Pill -- Pharyngeal -- Pharyngeal Comment pt does not sense residuals, dry swallows helpful - chin tuck and head turn right/left not helpful  CHL IP CERVICAL ESOPHAGEAL PHASE 11/29/2016 Cervical Esophageal Phase Impaired Pudding Teaspoon -- Pudding Cup -- Honey Teaspoon -- Honey Cup -- Nectar Teaspoon -- Nectar Cup -- Nectar Straw -- Thin Teaspoon -- Thin Cup -- Thin Straw -- Puree -- Mechanical Soft -- Regular -- Multi-consistency -- Pill -- Cervical Esophageal Comment slow clearance of barium = without pt awareness, liquids faciliate clearance No flowsheet data found. Macario Golds 11/29/2016, 9:38 AM  Luanna Salk, MS [facility] SLP [phone]               LOS: 1 day   Oren Binet, MD  [facility] Pager:[phone]  If 7PM-7AM, please contact night-coverage [email or web address] Password [email or web address] 12/21/2016, 10:59 AM

## 2016-12-21 NOTE — Progress Notes (Signed)
Nutrition Brief Note  Patient identified on the Malnutrition Screening Tool (MST) Report  Wt Readings from Last 15 Encounters:  12/20/16 145 lb (65.8 kg)  11/25/16 145 lb (65.8 kg)  05/14/15 144 lb 3.2 oz (65.4 kg)  04/16/15 146 lb (66.2 kg)  03/17/15 141 lb 11.2 oz (64.3 kg)  02/23/15 144 lb 12.8 oz (65.7 kg)  01/15/15 149 lb (67.6 kg)  01/12/15 149 lb 4 oz (67.7 kg)  01/12/15 149 lb 8 oz (67.8 kg)  08/17/14 141 lb (64 kg)  05/18/14 149 lb (67.6 kg)  03/18/14 147 lb (66.7 kg)  03/27/13 142 lb (64.4 kg)  07/19/12 141 lb (64 kg)  05/17/12 139 lb (63 kg)    Body mass index is 22.05 kg/m. Patient meets criteria for normal weight based on current BMI. Pt had reported he was unsure if weight had changed recently. Stage 1 bilateral buttocks pressure injuries. No muscle or fat wasting noted during physical assessment.   Pt with PMH of ongoing alcohol abuse (usually 2 glasses of gin/night) and depression. Pt found on the ground at home and had stated that he tried to make it to the door but collapsed. He informed EMS that he had been throwing up black-looking vomit for several days as well as having black, tarry stools during that time. Pt admitted for GIB.   Current diet order is NPO.  Medications reviewed; 1 mg oral folic acid/day, daily multivitamin with minerals, 8 mg/hr IV Protonix x72 hours starting 9/12 at 1300, 100 mg oral/IV thiamine/day.  Labs reviewed; Cl: 114 mmol/L, BUN: 28 mg/dL, Ca: 7.6 mg/dL.  No nutrition interventions warranted at this time. If nutrition issues arise, please consult RD.     Jarome Matin, MS, RD, LDN, [facility] Inpatient Clinical Dietitian Pager # [phone] After hours/weekend pager # [phone]

## 2016-12-22 LAB — COMPREHENSIVE METABOLIC PANEL
ALBUMIN: 2.6 g/dL — AB (ref 3.5–5.0)
ALK PHOS: 54 U/L (ref 38–126)
ALT: 14 U/L — AB (ref 17–63)
AST: 28 U/L (ref 15–41)
Anion gap: 7 (ref 5–15)
BUN: 12 mg/dL (ref 6–20)
CALCIUM: 8.3 mg/dL — AB (ref 8.9–10.3)
CO2: 25 mmol/L (ref 22–32)
CREATININE: 1 mg/dL (ref 0.61–1.24)
Chloride: 109 mmol/L (ref 101–111)
GFR calc Af Amer: >60 mL/min (ref >60)
GFR calc non Af Amer: >60 mL/min (ref >60)
GLUCOSE: 95 mg/dL (ref 65–99)
Potassium: 3 mmol/L — ABNORMAL LOW (ref 3.5–5.1)
Sodium: 141 mmol/L (ref 135–145)
TOTAL PROTEIN: 5.5 g/dL — AB (ref 6.5–8.1)
Total Bilirubin: 0.8 mg/dL (ref 0.3–1.2)

## 2016-12-22 LAB — CBC
HEMATOCRIT: 26 % — AB (ref 39.0–52.0)
HEMOGLOBIN: 8.6 g/dL — AB (ref 13.0–17.0)
MCH: 30.3 pg (ref 26.0–34.0)
MCHC: 33.1 g/dL (ref 30.0–36.0)
MCV: 91.5 fL (ref 78.0–100.0)
Platelets: 159 10*3/uL (ref 150–400)
RBC: 2.84 MIL/uL — AB (ref 4.22–5.81)
RDW: 18.8 % — ABNORMAL HIGH (ref 11.5–15.5)
WBC: 9.4 10*3/uL (ref 4.0–10.5)

## 2016-12-22 LAB — MAGNESIUM: Magnesium: 1.1 mg/dL — ABNORMAL LOW (ref 1.7–2.4)

## 2016-12-22 MED ORDER — MAGNESIUM OXIDE 400 (241.3 MG) MG PO TABS
400.0000 mg | ORAL_TABLET | Freq: Two times a day (BID) | ORAL | Status: AC
  Administered 2016-12-22 (×2): 400 mg via ORAL
  Filled 2016-12-22 (×2): qty 1

## 2016-12-22 MED ORDER — POTASSIUM CHLORIDE CRYS ER 10 MEQ PO TBCR
30.0000 meq | EXTENDED_RELEASE_TABLET | Freq: Two times a day (BID) | ORAL | Status: AC
  Administered 2016-12-22 (×2): 30 meq via ORAL
  Filled 2016-12-22 (×2): qty 1

## 2016-12-22 NOTE — Evaluation (Signed)
SLP Cancellation Note  Patient Details Name: Patrick [name] MRN: [id number] DOB: Jul 28, 1950   Cancelled treatment:       Reason Eval/Treat Not Completed: Other (comment);Patient at procedure or test/unavailable (pt working with PT, will continue efforts)   Luanna Salk, [facility] SLP [phone]

## 2016-12-22 NOTE — Evaluation (Signed)
Physical Therapy Evaluation Patient Details Name: Patrick [name] MRN: [id number] DOB: January 30, 1951 Today's Date: 12/22/2016   History of Present Illness  66 y.o. male with long-standing history of alcohol abuse, depression admitted with upper GI bleeding and associated acute blood loss anemia  Clinical Impression  Pt admitted with above diagnosis. Pt currently with functional limitations due to the deficits listed below (see PT Problem List).  Pt will benefit from skilled PT to increase their independence and safety with mobility to allow discharge to the venue listed below.  Pt assisted with short distance ambulation today.  Pt fatigues quickly and also unaware of loose BM during ambulation.   Pt with recent admission, lives alone and reports he needs more assist at home.  Currently recommending SNF upon d/c.     Follow Up Recommendations SNF;Supervision/Assistance - 24 hour    Equipment Recommendations  None recommended by PT    Recommendations for Other Services       Precautions / Restrictions Precautions Precautions: Fall Precaution Comments: incontinent during evaluation Restrictions Weight Bearing Restrictions: No      Mobility  Bed Mobility Overal bed mobility: Needs Assistance Bed Mobility: Supine to Sit     Supine to sit: Supervision;HOB elevated     General bed mobility comments: increased time, supervision for safety and lines  Transfers Overall transfer level: Needs assistance Equipment used: Rolling walker (2 wheeled) Transfers: Sit to/from Stand Sit to Stand: Min assist         General transfer comment: multimodal cues for safe technique  Ambulation/Gait Ambulation/Gait assistance: Min assist Ambulation Distance (Feet): 40 Feet Assistive device: Rolling walker (2 wheeled) Gait Pattern/deviations: Step-through pattern;Decreased stride length     General Gait Details: verbal cues for safety with RW, pt denies SOB and dizziness, SPO2 100% on room  air  Stairs            Wheelchair Mobility    Modified Rankin (Stroke Patients Only)       Balance Overall balance assessment: Needs assistance         Standing balance support: Bilateral upper extremity supported Standing balance-Leahy Scale: Poor Standing balance comment: requires UE support                             Pertinent Vitals/Pain Pain Assessment: No/denies pain    Home Living Family/patient expects to be discharged to:: Private residence Living Arrangements: Alone           Home Layout: Two level Home Equipment: Environmental consultant - 2 wheels      Prior Function Level of Independence: Independent with assistive device(s)         Comments: reports using RW sometimes, states he needs more assist at home     Hand Dominance        Extremity/Trunk Assessment   Upper Extremity Assessment Upper Extremity Assessment: Generalized weakness    Lower Extremity Assessment Lower Extremity Assessment: Generalized weakness    Cervical / Trunk Assessment Cervical / Trunk Assessment: Kyphotic  Communication   Communication: No difficulties  Cognition Arousal/Alertness: Awake/alert Behavior During Therapy: Flat affect Overall Cognitive Status: Impaired/Different from baseline Area of Impairment: Awareness;Safety/judgement;Problem solving                         Safety/Judgement: Decreased awareness of safety;Decreased awareness of deficits   Problem Solving: Slow processing;Decreased initiation;Difficulty sequencing;Requires verbal cues;Requires tactile cues General Comments: requires increased cues, incontinent  of BM during ambulation and unaware      General Comments      Exercises     Assessment/Plan    PT Assessment Patient needs continued PT services  PT Problem List Decreased strength;Decreased activity tolerance;Decreased balance;Decreased mobility;Decreased knowledge of precautions;Decreased safety  awareness;Decreased knowledge of use of DME;Decreased cognition       PT Treatment Interventions DME instruction;Gait training;Functional mobility training;Therapeutic activities;Patient/family education    PT Goals (Current goals can be found in the Care Plan section)  Acute Rehab PT Goals PT Goal Formulation: With patient Time For Goal Achievement: 01/05/17 Potential to Achieve Goals: Fair    Frequency Min 2X/week   Barriers to discharge        Co-evaluation               AM-PAC PT "6 Clicks" Daily Activity  Outcome Measure Difficulty turning over in bed (including adjusting bedclothes, sheets and blankets)?: A Little Difficulty moving from lying on back to sitting on the side of the bed? : A Little Difficulty sitting down on and standing up from a chair with arms (e.g., wheelchair, bedside commode, etc,.)?: Unable Help needed moving to and from a bed to chair (including a wheelchair)?: A Little Help needed walking in hospital room?: A Little Help needed climbing 3-5 steps with a railing? : A Lot 6 Click Score: 15    End of Session Equipment Utilized During Treatment: Gait belt Activity Tolerance: Patient tolerated treatment well Patient left: in chair;with call bell/phone within reach;with chair alarm set   PT Visit Diagnosis: Difficulty in walking, not elsewhere classified (R26.2);Adult, failure to thrive (R62.7)    Time: 1002-1028 PT Time Calculation (min) (ACUTE ONLY): 26 min   Charges:   PT Evaluation $PT Eval Moderate Complexity: 1 Mod     PT G Codes:        Carmelia Bake, PT, DPT 12/22/2016 Pager: [phone]  York Ram E 12/22/2016, 11:05 AM

## 2016-12-22 NOTE — Progress Notes (Signed)
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 The patient was found lying on the floor having vomited dark black material. Admitted for upper GI bleed.  Assessment/Plan Upper GI bleed secondary to LA grade D reflux esophagitis secondary to alcohol abuse seen on EGD 9/13. -No recurrent bleeding thus far. Hemoglobin currently stable. Continue BID PPI per GI -abstain from alcohol permanently  Acute blood loss anemia -Hemoglobin stable after transfusion one unit PRBC. Monitor for bleeding. Repeat CBC in am.  Alcohol abuse, chart indicates diagnosis of Korsakoff syndrome -Minimal use of Ativan. No evidence of acute withdrawal at this point. Continue CIWA.  Acute kidney injury secondary to upper GI bleed, acute blood loss anemia. -Resolved with IV fluids.  Dysphagia. Continue dysphagia 1 diet with nectar thick liquids as previously recommended. Medications crushed with pure. -Speech therapy consultation pending.  Fever. No evidence of pneumonia on examination or radiography. -Discontinue antibiotics. Monitor clinically  Cigarette smoker -Nicotine patch  Pressure ulcer sacral region stage II present on admission.   Hemoglobin stable, hemodynamics stable. Transfer to medical floor and continue to monitor. Repeat CBC in the morning. Can likely discharge 9/15 if remains stable.  Replace potassium, replace magnesium  DVT prophylaxis: SCDs Code Status: full Family Communication: none Disposition Plan: home    Murray Hodgkins, MD  [facility] Direct contact: [phone] --[place] [place]  --[email or web address]; password [email or web address]  7PM-7AM contact  night coverage as above 12/22/2016, 9:32 AM  LOS: 2 days   Consultants:  Gastroenterology  Procedures:  EGD 9/13: Grade D reflux esophagitis  Transfusion one unit PRBC  Antimicrobials:    Interval history/Subjective: Poor appetite, some difficulty swallowing. No pain. No nausea or vomiting. Tolerating diet. Per nursing, only 2 doses of Ativan overnight.  Objective: Vitals: Afebrile, 98.0, 22, 103, 113/77, 100% on 2 L nasal cannula  Exam:     Constitutional: Appears calm, comfortable, eating breakfast including eggs and bacon  Eyes. Pupils, irises, lids appear unremarkable.  Respiratory. Clear to auscultation bilaterally. No wheezes, rales or rhonchi. Normal respiratory effort.  Cardiovascular. Regular rate and rhythm. No murmur, rub or gallop. No lower extremity edema.  Abdomen. Soft, nontender, nondistended.  Psychiatric. Grossly normal mood and affect. Speech fluent and appropriate.   I have personally reviewed the following:   Labs:  Basic metabolic panel unremarkable except potassium 3.0  Magnesium 1.1  LFTs unremarkable  Hemoglobin stable 8.6, remainder CBC unremarkable  Imaging studies:  Chest x-ray no acute disease  Medical tests:  Sinus tachycardia, no acute changes  Review and summation of old records:  See discharge summary from August  Scheduled Meds: . folic acid  1 mg Oral Daily  . LORazepam  0-4 mg Intravenous Q6H   Followed by  . LORazepam  0-4 mg Intravenous Q12H  . magnesium oxide  400 mg Oral BID  . multivitamin with minerals  1 tablet Oral Daily  . nicotine  21 mg Transdermal Daily  . pantoprazole  40 mg Oral BID  . potassium chloride  30 mEq Oral BID  . thiamine  100 mg Oral Daily   Or  . thiamine  100 mg Intravenous Daily   Continuous Infusions: . vancomycin Stopped (12/22/16 0920)    Principal Problem:   GI bleed Active  Problems:   TOBACCO USE   Alcohol dependence ([place])   Pressure ulcer of sacral region, stage  2   Alcohol abuse   AKI (acute kidney injury) ([place])   LOS: 2 days

## 2016-12-23 DIAGNOSIS — K922 Gastrointestinal hemorrhage, unspecified: Secondary | ICD-10-CM

## 2016-12-23 LAB — CBC
HCT: 20.4 % — ABNORMAL LOW (ref 39.0–52.0)
Hemoglobin: 6.8 g/dL — CL (ref 13.0–17.0)
MCH: 30.4 pg (ref 26.0–34.0)
MCHC: 33.3 g/dL (ref 30.0–36.0)
MCV: 91.1 fL (ref 78.0–100.0)
PLATELETS: 126 10*3/uL — AB (ref 150–400)
RBC: 2.24 MIL/uL — AB (ref 4.22–5.81)
RDW: 17.8 % — ABNORMAL HIGH (ref 11.5–15.5)
WBC: 5.8 10*3/uL (ref 4.0–10.5)

## 2016-12-23 LAB — PREPARE RBC (CROSSMATCH)

## 2016-12-23 MED ORDER — DIPHENHYDRAMINE HCL 50 MG/ML IJ SOLN
25.0000 mg | Freq: Once | INTRAMUSCULAR | Status: AC
  Administered 2016-12-23: 25 mg via INTRAVENOUS
  Filled 2016-12-23: qty 1

## 2016-12-23 MED ORDER — PRO-STAT SUGAR FREE PO LIQD
30.0000 mL | Freq: Two times a day (BID) | ORAL | Status: DC
  Administered 2016-12-23: 30 mL via ORAL
  Filled 2016-12-23: qty 30

## 2016-12-23 MED ORDER — MAGNESIUM SULFATE 2 GM/50ML IV SOLN
2.0000 g | Freq: Once | INTRAVENOUS | Status: AC
  Administered 2016-12-23: 2 g via INTRAVENOUS
  Filled 2016-12-23: qty 50

## 2016-12-23 MED ORDER — ACETAMINOPHEN 325 MG PO TABS
650.0000 mg | ORAL_TABLET | Freq: Once | ORAL | Status: AC
  Administered 2016-12-23: 650 mg via ORAL
  Filled 2016-12-23: qty 2

## 2016-12-23 MED ORDER — POTASSIUM CHLORIDE 10 MEQ/100ML IV SOLN
10.0000 meq | INTRAVENOUS | Status: AC
  Administered 2016-12-23 (×3): 10 meq via INTRAVENOUS
  Filled 2016-12-23 (×3): qty 100

## 2016-12-23 MED ORDER — SODIUM CHLORIDE 0.9 % IV SOLN
Freq: Once | INTRAVENOUS | Status: AC
  Administered 2016-12-23: 10:00:00 via INTRAVENOUS

## 2016-12-23 NOTE — Progress Notes (Addendum)
Patient ID: Nat Christen, male   DOB: 1951/03/04, 66 y.o.   MRN: [id number]                                                                PROGRESS NOTE                                                                                                                                                                                                             Patient Demographics:    Tylen Leverich, is a 66 y.o. male, DOB - Sep 04, 1950, JZP:[id number]  Admit date - 12/20/2016   Admitting Physician Annita Brod, MD  Outpatient Primary MD for the patient is [name], MD  LOS - 3  Outpatient Specialists:     No chief complaint on file.    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 The patient was found lying on the floor having vomited dark black material. Admitted for upper GI bleed.   Subjective:    Caron Presume today has been w/o complaint.  Denies gerd, abd pain, diarrhea, brbpr.    No headache, No chest pain, No new weakness tingling or numbness, No Cough - SOB.    Assessment  & Plan :    Principal Problem:   GI bleed Active Problems:   TOBACCO USE   Alcohol dependence ([place])   Pressure ulcer of sacral region, stage 2   Alcohol abuse   AKI (acute kidney injury) ([place])   Upper GI bleed secondary to LA grade D reflux esophagitis secondary to alcohol abuse seen on EGD 9/13. -No recurrent bleeding thus far.  Hemoglobin 6.8 Continue BID PPI per GI abstain from alcohol permanently Appreciate GI input  Acute blood loss anemia S/p 1 unit prbc Transfuse 2 units prbc today Continue PPI  Hypokalemia Replete Check cmp in am  Hypomagnesemia Mag iv Check magnesium in am  Protein calorie malnutrition ,  severe prostat  Alcohol abuse, chart indicates diagnosis of Korsakoff syndrome -Minimal use of Ativan. No evidence of acute withdrawal at this point. Continue CIWA.  Acute kidney injury secondary to upper GI bleed,  acute blood loss anemia. -Resolved with IV fluids. Check cmp in am  Dysphagia. Continue dysphagia 1 diet with nectar thick liquids as previously recommended. Medications crushed with pure. -Speech therapy consultation pending.  Fever. No evidence of pneumonia on examination or radiography. -Discontinue antibiotics. Monitor clinically  Cigarette smoker -Nicotine patch  Pressure ulcer sacral region stage II present on admission.  DVT prophylaxis: SCDs Code Status: full Family Communication: w patient,daughter in Sports coach regarding placement  Disposition Plan:  SNF, if qualifies, PT recommended SNF , Consults  :  GI  Procedures  : EGD 9/13=>  - LA Grade D reflux esophagitis. This is the source of coffee grounds emesis.  - 10 cm hiatal hernia. - Normal stomach. - Normal examined duodenum.    Lab Results  Component Value Date   PLT 126 (L) 12/23/2016    Antibiotics  :    Anti-infectives    Start     Dose/Rate Route Frequency Ordered Stop   12/20/16 2200  piperacillin-tazobactam (ZOSYN) IVPB 3.375 g  Status:  Discontinued     3.375 g 12.5 mL/hr over 240 Minutes Intravenous Every 8 hours 12/20/16 1952 12/22/16 0931   12/20/16 2000  vancomycin (VANCOCIN) 500 mg in sodium chloride 0.9 % 100 mL IVPB  Status:  Discontinued     500 mg 100 mL/hr over 60 Minutes Intravenous Every 12 hours 12/20/16 1952 12/22/16 1027   12/20/16 1500  piperacillin-tazobactam (ZOSYN) IVPB 3.375 g     3.375 g 100 mL/hr over 30 Minutes Intravenous  Once 12/20/16 1458 12/20/16 1614        Objective:   Vitals:   12/22/16 1600 12/22/16 1850 12/22/16 2047 12/23/16 0542  BP:  120/77 126/76 119/75  Pulse:  (!) 102 (!) 103 100  Resp:   16 15  Temp: 99.1 F (37.3 C) 98.3 F (36.8  C) 98.6 F (37 C) 98.6 F (37 C)  TempSrc: Oral Oral Oral Oral  SpO2:  100% 100% 99%  Weight:      Height:        Wt Readings from Last 3 Encounters:  12/22/16 59.9 kg (132 lb 0.9 oz)  11/25/16 65.8 kg (145 lb)  05/14/15 65.4 kg (144 lb 3.2 oz)     Intake/Output Summary (Last 24 hours) at 12/23/16 0956 Last data filed at 12/23/16 0909  Gross per 24 hour  Intake              240 ml  Output                0 ml  Net              240 ml     Physical Exam  Awake Alert, Oriented X 2, No new F.N deficits, Normal affect [place].AT,PERRAL Supple Neck,No JVD, No cervical lymphadenopathy appriciated.  Symmetrical Chest wall movement, Good air movement bilaterally, CTAB RRR,No Gallops,Rubs or new Murmurs, No Parasternal Heave +ve B.Sounds, Abd Soft, No tenderness, No organomegaly appriciated, No rebound - guarding or rigidity. No Cyanosis, Clubbing or edema, No new Rash or bruise     Data Review:    CBC  Recent Labs Lab 12/20/16 1251 12/20/16 2154 12/21/16 0310 12/21/16 0705 12/22/16 0318 12/23/16 0632  WBC 19.8*  --  19.6*  --  9.4 5.8  HGB 8.9* 7.5* 6.6* 8.6* 8.6* 6.8*  HCT 26.5* 21.6* 19.4* 25.4* 26.0* 20.4*  PLT 315  --  181  --  159 126*  MCV 95.0  --  95.1  --  91.5 91.1  MCH 31.9  --  32.4  --  30.3 30.4  MCHC 33.6  --  34.0  --  33.1 33.3  RDW 14.9  --  15.1  --  18.8* 17.8*  LYMPHSABS 0.8  --   --   --   --   --   MONOABS 1.3*  --   --   --   --   --   EOSABS 0.0  --   --   --   --   --   BASOSABS 0.0  --   --   --   --   --     Chemistries   Recent Labs Lab 12/20/16 1251 12/21/16 0310 12/22/16 0318  NA 142 145 141  K 3.5 3.5 3.0*  CL 104 114* 109  CO2 21* 22 25  GLUCOSE 191* 110* 95  BUN 25* 28* 12  CREATININE 1.32* 1.10 1.00  CALCIUM 7.5* 7.6* 8.3*  MG  --   --  1.1*  AST 28  --  28  ALT 14*  --  14*  ALKPHOS 66  --  54  BILITOT 0.7  --  0.8    ------------------------------------------------------------------------------------------------------------------ No results for input(s): CHOL, HDL, LDLCALC, TRIG, CHOLHDL, LDLDIRECT in the last 72 hours.  Lab Results  Component Value Date   HGBA1C 5.2 12/20/2016   ------------------------------------------------------------------------------------------------------------------ No results for input(s): TSH, T4TOTAL, T3FREE, THYROIDAB in the last 72 hours.  Invalid input(s): FREET3 ------------------------------------------------------------------------------------------------------------------ No results for input(s): VITAMINB12, FOLATE, FERRITIN, TIBC, IRON, RETICCTPCT in the last 72 hours.  Coagulation profile  Recent Labs Lab 12/20/16 1251  INR 1.19    No results for input(s): DDIMER in the last 72 hours.  Cardiac Enzymes No results for input(s): CKMB, TROPONINI, MYOGLOBIN in the last 168 hours.  Invalid input(s): CK ------------------------------------------------------------------------------------------------------------------ No results found for: BNP  Inpatient Medications  Scheduled Meds: . acetaminophen  650 mg Oral Once  . diphenhydrAMINE  25 mg Intravenous Once  . folic acid  1 mg Oral Daily  . LORazepam  0-4 mg Intravenous Q12H  . multivitamin with minerals  1 tablet Oral Daily  . nicotine  21 mg Transdermal Daily  . pantoprazole  40 mg Oral BID  . thiamine  100 mg Oral Daily   Continuous Infusions: . sodium chloride    . potassium chloride 10 mEq (12/23/16 0900)   PRN Meds:.albuterol, [DISCONTINUED] LORazepam **OR** LORazepam, ondansetron **OR** ondansetron (ZOFRAN) IV  Micro Results Recent Results (from the past 240 hour(s))  Blood culture (routine x 2)     Status: None (Preliminary result)   Collection Time: 12/20/16  3:14 PM  Result Value Ref Range Status   Specimen Description BLOOD LEFT HAND  Final   Special Requests IN PEDIATRIC BOTTLE  Blood Culture adequate volume  Final   Culture   Final    NO GROWTH 3 DAYS Performed at [facility], [place] [address], [place], [place] [address]    Report Status PENDING  Incomplete  Blood culture (routine x 2)     Status: None (Preliminary result)   Collection Time: 12/20/16  3:33 PM  Result Value Ref Range Status   Specimen Description BLOOD RIGHT HAND  Final   Special Requests   Final    BOTTLES DRAWN AEROBIC AND ANAEROBIC Blood Culture adequate volume   Culture   Final    NO GROWTH 3 DAYS Performed at [facility], [place] [address] [place], [place] [address]  Report Status PENDING  Incomplete  MRSA PCR Screening     Status: None   Collection Time: 12/20/16  5:46 PM  Result Value Ref Range Status   MRSA by PCR NEGATIVE NEGATIVE Final    Comment:        The GeneXpert MRSA Assay (FDA approved for NASAL specimens only), is one component of a comprehensive MRSA colonization surveillance program. It is not intended to diagnose MRSA infection nor to guide or monitor treatment for MRSA infections.     Radiology Reports Dg Chest 2 View  Result Date: 11/25/2016 CLINICAL DATA:  Cough and wheezing.  Hypotensive. EXAM: CHEST  2 VIEW COMPARISON:  Chest radiograph April 26, 2011 FINDINGS: Increased lung volumes with apical bullous changes. Cardiac silhouette is normal in size. Mediastinal silhouette is nonsuspicious, mildly calcified aortic knob. Small to moderate hiatal hernia. No pleural effusion or focal consolidation. No pneumothorax. Soft tissue planes and included osseous structures are nonsuspicious. IMPRESSION: COPD, no acute cardiopulmonary process. Small to moderate hiatal hernia. Electronically Signed   By: Elon Alas M.D.   On: 11/25/2016 03:14   Ct Angio Chest Pe W Or Wo Contrast  Result Date: 11/29/2016 CLINICAL DATA:  Cough, shortness of breath. Suspect pulmonary embolism. History of smoking, alcohol abuse and erosive esophagitis. EXAM: CT  ANGIOGRAPHY CHEST WITH CONTRAST TECHNIQUE: Multidetector CT imaging of the chest was performed using the standard protocol during bolus administration of intravenous contrast. Multiplanar CT image reconstructions and MIPs were obtained to evaluate the vascular anatomy. CONTRAST:  170 cc Isovue 370 COMPARISON:  Chest radiograph November 29, 2016 at 1052 hours FINDINGS: Patient was coughing during examination resulting in moderate motion artifact. Examination with re-attempted without significant image quality improvement. CARDIOVASCULAR: Suboptimal contrast opacification of the pulmonary artery's (200 Hounsfield units, target is 250 Hounsfield units. Main pulmonary artery is not enlarged. No pulmonary arterial filling defects to the level of the segmental branches. Heart size is normal, no right heart strain. No pericardial effusion. Thoracic aorta is normal course and caliber, mild calcific atherosclerosis. MEDIASTINUM/NODES: No lymphadenopathy by CT size criteria. Patulous fluid-filled esophagus. LUNGS/PLEURA: Tracheobronchial tree is patent, no pneumothorax. Mild centrilobular emphysema. Apical bullous changes. Strandy densities RIGHT lower lobe. UPPER ABDOMEN: Included view of the abdomen is nonacute. Moderate hiatal hernia. Hepatic steatosis. MUSCULOSKELETAL: Visualized soft tissues and included osseous structures appear nonacute. Old posterior RIGHT rib fractures. Moderate included cervical spondylosis. Review of the MIP images confirms the above findings. IMPRESSION: 1. No acute pulmonary embolism on this moderately motion degraded examination. 2. RIGHT lower lobe atelectasis.  Mild centrilobular emphysema. 3. Patulous fluid-filled esophagus with moderate hiatal hernia, aspiration risk. Aortic Atherosclerosis (ICD10-I70.0) and Emphysema (ICD10-J43.9). Electronically Signed   By: Elon Alas M.D.   On: 11/29/2016 19:43   Ct Abdomen Pelvis W Contrast  Result Date: 12/20/2016 CLINICAL DATA:  [place] when  trying to answer the door, covered in coffee-ground emesis, abdominal pain starting yesterday EXAM: CT ABDOMEN AND PELVIS WITH CONTRAST TECHNIQUE: Multidetector CT imaging of the abdomen and pelvis was performed using the standard protocol following bolus administration of intravenous contrast. Sagittal and coronal MPR images reconstructed from axial data set. CONTRAST:  118mL ISOVUE-300 IOPAMIDOL (ISOVUE-300) INJECTION 61% IV. Water given as oral contrast. COMPARISON:  None FINDINGS: Lower chest: Minimal atelectasis at RIGHT lung base. Hepatobiliary: Diffuse fatty infiltration of liver. Nonspecific 13 x 9 mm low-attenuation lesion RIGHT lobe liver, slightly ill-defined, without enhancement or washout. Liver otherwise unremarkable. Dependent density in gallbladder question small gallstone. Pancreas: Normal appearance Spleen:  Normal appearance Adrenals/Urinary Tract: Adrenal glands normal appearance. BILATERAL renal cysts largest lateral mid RIGHT kidney 3.0 x 2.8 cm image 28. Kidneys, ureters and bladder otherwise normal appearance Stomach/Bowel: Moderate sized hiatal hernia. Stomach distended by fluid. Wall thickening of the pylorus, cannot exclude component of gastric outlet obstruction. No adjacent infiltrative changes to suggest penetrating ulcer disease. Descending and sigmoid diverticulosis without evidence of diverticulitis. Normal appendix. Small bowel loops unremarkable. Vascular/Lymphatic: Atherosclerotic calcifications aorta and iliac arteries without aneurysm. No adenopathy. Reproductive: Prostatic calcifications and minimal enlargement. Unremarkable seminal vesicles. Other: No free air or free fluid. No definite inflammatory process. No hernia. Musculoskeletal: Diffuse osseous demineralization. IMPRESSION: Distended stomach with a moderate sized hiatal hernia and thickening of the pylorus raising question of gastric outlet obstruction. Distal colonic diverticulosis without evidence of diverticulitis.  Fatty infiltration of liver with a nonspecific slightly ill-defined 13 x 9 mm lesion in the RIGHT lobe ; followup nonemergent characterization by MR imaging recommended. Cholelithiasis. Electronically Signed   By: Lavonia Dana M.D.   On: 12/20/2016 15:18   Dg Chest Port 1 View  Result Date: 12/20/2016 CLINICAL DATA:  Fever.  History of tobacco abuse. EXAM: PORTABLE CHEST 1 VIEW COMPARISON:  Chest radiograph November 29, 2016 FINDINGS: Cardiac silhouette is mildly enlarged. Mediastinal silhouette is nonsuspicious, calcified aortic knob. Calcified aortic knob. No pleural effusion or focal consolidation. Increased lung volumes. No pneumothorax. Moderate hiatal hernia. Old RIGHT rib fractures. Soft tissue planes are nonsuspicious. IMPRESSION: 1. Mild cardiomegaly and COPD. 2. Moderate hiatal hernia. Electronically Signed   By: Elon Alas M.D.   On: 12/20/2016 21:39   Dg Chest Port 1 View  Result Date: 11/29/2016 CLINICAL DATA:  Wheezing EXAM: PORTABLE CHEST 1 VIEW COMPARISON:  11/25/2016 FINDINGS: Heart size upper limits of normal. Aortic atherosclerosis. Hiatal hernia present. Question hazy infiltrate right lower lobe. The remainder the chest is clear. No effusions. No significant bone finding. IMPRESSION: Question AC pneumonia right lower lobe. Aortic atherosclerosis. Hiatal hernia. Electronically Signed   By: Nelson Chimes M.D.   On: 11/29/2016 11:18   Dg Swallowing Func-speech Pathology  Result Date: 11/29/2016 Objective Swallowing Evaluation: Type of Study: MBS-Modified Barium Swallow Study Patient Details Name: GUINN DELAROSA MRN: [id number] Date of Birth: December 07, 1950 Today's Date: 11/29/2016 Time: SLP Start Time (ACUTE ONLY): 0845-SLP Stop Time (ACUTE ONLY): 0915 SLP Time Calculation (min) (ACUTE ONLY): 30 min Past Medical History: Past Medical History: Diagnosis Date . Colon polyps  . Depression  . Diverticulitis  . Diverticulosis of colon  . GERD (gastroesophageal reflux disease)  . Kidney stone   Past Surgical History: Past Surgical History: Procedure Laterality Date . COLONOSCOPY   . UPPER GASTROINTESTINAL ENDOSCOPY   HPI: 66 yo male adm to [facility]  - found at home where he had been sitting for multiple days in his urine.  Pt has h/o etoh use, GERD and is for involuntary commitment due to concerns for his well being.  RN noted pt was coughing with intake = and swallow evaluation was ordered.  Pt states he coughs all the time.  MBS indicatd to allow instrumental eval and determine least restrictive diet.  Subjective: pt awake in chair` Assessment / Plan / Recommendation CHL IP CLINICAL IMPRESSIONS 11/29/2016 Clinical Impression Patient presents with moderate oropharyngeal dysphagia with sensorimotor deficits.  Decreased oral control characterized with lingual rocking - delayed oral transiting and premature spillage noted.  Pharyngeal swallow was timely but decreased tongue base retraction/epiglottic deflection results in vallecular residuals without patient awareness.  Pt did  have aspiration of thin via cup before the swallow due to decreased timing of laryngeal closure resulting in cough.  Of note patient did NOT cough before during or after MBS except during aspiration event.  Dry swallow help to decrease vallecular residuals but patient requires cues to conduct them.  Strict aspiration precautions recommended.  Using live images, educated patient to findings/recommendations.  Advised him to strengthen his cough/hock.  SLP Visit Diagnosis Dysphagia, oropharyngeal phase (R13.12) Attention and concentration deficit following -- Frontal lobe and executive function deficit following -- Impact on safety and function Moderate aspiration risk   CHL IP TREATMENT RECOMMENDATION 11/29/2016 Treatment Recommendations Therapy as outlined in treatment plan below   Prognosis 11/29/2016 Prognosis for Safe Diet Advancement Guarded Barriers to Reach Goals Severity of deficits Barriers/Prognosis Comment -- CHL IP DIET  RECOMMENDATION 11/29/2016 SLP Diet Recommendations Dysphagia 1 (Puree) solids;Nectar thick liquid Liquid Administration via Cup;Straw Medication Administration Crushed with puree Compensations Slow rate;Small sips/bites;Multiple dry swallows after each bite/sip;Follow solids with liquid;Effortful swallow Postural Changes Remain semi-upright after after feeds/meals (Comment);Seated upright at 90 degrees   CHL IP OTHER RECOMMENDATIONS 11/29/2016 Recommended Consults -- Oral Care Recommendations Oral care BID Other Recommendations Order thickener from pharmacy   CHL IP FOLLOW UP RECOMMENDATIONS 11/29/2016 Follow up Recommendations None   CHL IP FREQUENCY AND DURATION 11/29/2016 Speech Therapy Frequency (ACUTE ONLY) min 2x/week Treatment Duration 1 week      CHL IP ORAL PHASE 11/29/2016 Oral Phase Impaired Oral - Pudding Teaspoon -- Oral - Pudding Cup -- Oral - Honey Teaspoon -- Oral - Honey Cup -- Oral - Nectar Teaspoon Weak lingual manipulation;Lingual pumping;Reduced posterior propulsion;Delayed oral transit;Decreased bolus cohesion;Premature spillage Oral - Nectar Cup Weak lingual manipulation;Lingual pumping;Reduced posterior propulsion;Decreased bolus cohesion;Premature spillage Oral - Nectar Straw Weak lingual manipulation;Lingual pumping;Delayed oral transit;Decreased bolus cohesion;Premature spillage Oral - Thin Teaspoon Weak lingual manipulation;Lingual pumping;Delayed oral transit;Decreased bolus cohesion;Premature spillage Oral - Thin Cup Weak lingual manipulation;Lingual pumping;Delayed oral transit;Decreased bolus cohesion;Premature spillage Oral - Thin Straw -- Oral - Puree Weak lingual manipulation;Lingual pumping;Reduced posterior propulsion;Decreased bolus cohesion;Premature spillage Oral - Mech Soft Weak lingual manipulation;Lingual pumping;Reduced posterior propulsion;Piecemeal swallowing;Delayed oral transit;Decreased bolus cohesion;Premature spillage Oral - Regular -- Oral - Multi-Consistency -- Oral -  Pill -- Oral Phase - Comment --  CHL IP PHARYNGEAL PHASE 11/29/2016 Pharyngeal Phase Impaired Pharyngeal- Pudding Teaspoon -- Pharyngeal -- Pharyngeal- Pudding Cup -- Pharyngeal -- Pharyngeal- Honey Teaspoon -- Pharyngeal -- Pharyngeal- Honey Cup -- Pharyngeal -- Pharyngeal- Nectar Teaspoon Reduced epiglottic inversion;Reduced laryngeal elevation;Reduced airway/laryngeal closure;Reduced tongue base retraction;Pharyngeal residue - valleculae Pharyngeal -- Pharyngeal- Nectar Cup Reduced pharyngeal peristalsis;Reduced epiglottic inversion;Reduced tongue base retraction;Pharyngeal residue - valleculae Pharyngeal -- Pharyngeal- Nectar Straw Reduced epiglottic inversion;Reduced anterior laryngeal mobility;Reduced laryngeal elevation;Reduced airway/laryngeal closure;Reduced tongue base retraction;Pharyngeal residue - valleculae Pharyngeal -- Pharyngeal- Thin Teaspoon Reduced epiglottic inversion;Reduced anterior laryngeal mobility;Reduced laryngeal elevation;Reduced airway/laryngeal closure;Reduced tongue base retraction;Pharyngeal residue - valleculae Pharyngeal -- Pharyngeal- Thin Cup Reduced epiglottic inversion;Reduced anterior laryngeal mobility;Reduced laryngeal elevation;Reduced airway/laryngeal closure;Reduced tongue base retraction;Pharyngeal residue - valleculae Pharyngeal -- Pharyngeal- Thin Straw Reduced epiglottic inversion;Reduced anterior laryngeal mobility;Reduced airway/laryngeal closure;Reduced tongue base retraction;Pharyngeal residue - valleculae Pharyngeal -- Pharyngeal- Puree Reduced epiglottic inversion;Reduced anterior laryngeal mobility;Reduced laryngeal elevation;Reduced airway/laryngeal closure Pharyngeal -- Pharyngeal- Mechanical Soft Reduced epiglottic inversion;Reduced anterior laryngeal mobility;Reduced laryngeal elevation;Reduced tongue base retraction;Pharyngeal residue - valleculae Pharyngeal -- Pharyngeal- Regular -- Pharyngeal -- Pharyngeal- Multi-consistency -- Pharyngeal -- Pharyngeal-  Pill -- Pharyngeal -- Pharyngeal Comment pt does not sense residuals, dry swallows helpful - chin tuck  and head turn right/left not helpful  CHL IP CERVICAL ESOPHAGEAL PHASE 11/29/2016 Cervical Esophageal Phase Impaired Pudding Teaspoon -- Pudding Cup -- Honey Teaspoon -- Honey Cup -- Nectar Teaspoon -- Nectar Cup -- Nectar Straw -- Thin Teaspoon -- Thin Cup -- Thin Straw -- Puree -- Mechanical Soft -- Regular -- Multi-consistency -- Pill -- Cervical Esophageal Comment slow clearance of barium = without pt awareness, liquids faciliate clearance No flowsheet data found. Macario Golds 11/29/2016, 9:38 AM  Luanna Salk, MS [facility] SLP [phone]              Time Spent in minutes  30   Jani Gravel M.D on 12/23/2016 at 9:56 AM  Between 7am to 7pm - Pager - [phone]  After 7pm go to [email or web address] - password [facility]  [facility] -  Office  [phone]

## 2016-12-23 NOTE — Progress Notes (Signed)
Pt leaving AMA. Requested AMA document to sign. Pt advise about necessity of remaining in the hospital and completing treatment but pt still refused to stay. Risks of leaving without receiving  full medical treatment, to include death, also explained to pt; but he refused and kept requesting AMA document to sign. MD made aware. AC also made aware. [place], AC came up and spoke with pt; but pt continued to refuse to remain in hospital  and requested to leave. Blood transfusion infusing at the time. Pt kept pushing the bottom on the pump in an effort to stop the transfusion and started to remove IV. About 20 ml of blood remained in bag at the time pt attempted removing PIV and stopping the pump. Transfusion stopped. PIV flushed and removed. AMA document presented to and signed by patient.  Pt left unit in wheelchair pushed by hospital security to catch a cab or ride home. Left in stable condition.  Hale Bogus.

## 2016-12-23 NOTE — Evaluation (Signed)
Clinical/Bedside Swallow Evaluation Patient Details  Name: Patrick [name] MRN: [id number] Date of Birth: 05/24/1950  Today's Date: 12/23/2016 Time: SLP Start Time (ACUTE ONLY): 3888 SLP Stop Time (ACUTE ONLY): 1450 SLP Time Calculation (min) (ACUTE ONLY): 11 min  Past Medical History:  Past Medical History:  Diagnosis Date  . Colon polyps   . Depression   . Diverticulitis   . Diverticulosis of colon   . GERD (gastroesophageal reflux disease)   . Kidney stone    Past Surgical History:  Past Surgical History:  Procedure Laterality Date  . COLONOSCOPY    . ESOPHAGOGASTRODUODENOSCOPY (EGD) WITH PROPOFOL N/A 12/21/2016   Procedure: ESOPHAGOGASTRODUODENOSCOPY (EGD) WITH PROPOFOL;  Surgeon: Doran Stabler, MD;  Location: [facility];  Service: Gastroenterology;  Laterality: N/A;  . 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 The patient was found lying on the floor having vomited dark black material. Admitted for upper GI bleed secondary to LA grade D reflux esophagitis secondary to alcohol abuse,seen on EGD 9/13. Most recent MBS 11/29/16 indicated "moderate oropharyngeal dysphagia with sensorimotor deficits. Decreased oral control characterized with lingual rocking - delayed oral transiting and premature spillage noted. Pharyngeal swallow was timely but decreased tongue base retraction/epiglottic deflection results in vallecular residuals without patient awareness. Pt did have aspiration of thin via cup before the swallow due to decreased timing of laryngeal closure resulting in cough" with recommendations for dysphagia 1 solids, nectar thick liquids. At discharge however it was documented that patient unablel to carry overt  compensatory strategies and that po intake should be with known risk of aspiration.    Assessment / Plan / Recommendation Clinical Impression  Patient presents with what appears to be a functional oropharyngeal swallow with intact airway protection. Oral transit time mildly prolonged however patient able to consume pos without overt s/s of aspiration. Although suspect some degree of residual dysphagia given severity of dysphagia documented only recently during 11/2016 admission, swallow appears significantly improved.  In light of hx, recommend repeat MBS this admission to ensure patient on least restrictive diet and establish new baseline.  SLP Visit Diagnosis: Dysphagia, unspecified (R13.10)    Aspiration Risk       Diet Recommendation Regular;Thin liquid   Liquid Administration via: Cup;Straw Medication Administration: Whole meds with liquid Supervision: Patient able to self feed;Intermittent supervision to cue for compensatory strategies Compensations: Slow rate;Small sips/bites Postural Changes: Remain upright for at least 30 minutes after po intake;Seated upright at 90 degrees    Other  Recommendations Oral Care Recommendations: Oral care BID   Follow up Recommendations  (TBD)        Swallow Study   General HPI: Patrick [name] PMH ongoing alcohol abuse, recently hospitalized for symptomatic anemia, severe dysphagia, metabolic encephalopathy, 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 The patient was found lying on the floor having vomited dark black material. Admitted for upper GI bleed secondary to LA grade D reflux esophagitis secondary to alcohol abuse,seen on EGD 9/13. Most recent MBS 11/29/16 indicated "moderate oropharyngeal dysphagia with sensorimotor deficits. Decreased oral control characterized with lingual rocking - delayed oral transiting and premature spillage noted. Pharyngeal  swallow was timely but decreased tongue base retraction/epiglottic deflection results in vallecular residuals without patient awareness. Pt  did have aspiration of thin via cup before the swallow due to decreased timing of laryngeal closure resulting in cough" with recommendations for dysphagia 1 solids, nectar thick liquids. At discharge however it was documented that patient unablel to carry overt compensatory strategies and that po intake should be with known risk of aspiration.  Type of Study: Bedside Swallow Evaluation Previous Swallow Assessment: see HPI Diet Prior to this Study: Regular;Thin liquids (MD notes indicate dys 1, nectar thick liquid, order is reg) Temperature Spikes Noted: No Respiratory Status: Room air History of Recent Intubation: No Behavior/Cognition: Alert;Requires cueing Oral Cavity Assessment: Within Functional Limits Oral Care Completed by SLP: Recent completion by staff Oral Cavity - Dentition: Poor condition Vision: Functional for self-feeding Self-Feeding Abilities: Able to feed self Patient Positioning: Upright in bed Baseline Vocal Quality: Normal Volitional Cough: Strong Volitional Swallow: Able to elicit    Oral/Motor/Sensory Function Overall Oral Motor/Sensory Function: Within functional limits   Ice Chips Ice chips: Not tested   Thin Liquid Thin Liquid: Within functional limits Presentation: Cup;Self Fed;Straw    Nectar Thick Nectar Thick Liquid: Not tested   Honey Thick Honey Thick Liquid: Not tested   Puree Puree: Within functional limits Presentation: Spoon   Solid   Porcha Deblanc MA, CCC-SLP [phone]    Solid: Within functional limits Presentation: Self Fed        Ovide Dusek Meryl 12/23/2016,2:Patrick PM

## 2016-12-25 LAB — TYPE AND SCREEN
ABO/RH(D): A POS
Antibody Screen: NEGATIVE
UNIT DIVISION: 0
UNIT DIVISION: 0
Unit division: 0
Unit division: 0

## 2016-12-25 LAB — BPAM RBC
BLOOD PRODUCT EXPIRATION DATE: 201810022359
Blood Product Expiration Date: 201810022359
Blood Product Expiration Date: 201810032359
Blood Product Expiration Date: 201810032359
ISSUE DATE / TIME: 201809130418
ISSUE DATE / TIME: 201809151220
ISSUE DATE / TIME: 201809151540
ISSUE DATE / TIME: 201809161014
UNIT TYPE AND RH: 6200
UNIT TYPE AND RH: 6200
UNIT TYPE AND RH: 6200
Unit Type and Rh: 6200

## 2016-12-25 LAB — CULTURE, BLOOD (ROUTINE X 2)
CULTURE: NO GROWTH
Culture: NO GROWTH
Special Requests: ADEQUATE
Special Requests: ADEQUATE

## 2017-01-04 ENCOUNTER — Inpatient Hospital Stay (HOSPITAL_COMMUNITY): Payer: Self-pay

## 2017-01-04 ENCOUNTER — Encounter (HOSPITAL_COMMUNITY): Payer: Self-pay | Admitting: *Deleted

## 2017-01-04 ENCOUNTER — Inpatient Hospital Stay (HOSPITAL_COMMUNITY)
Admission: EM | Admit: 2017-01-04 | Discharge: 2017-01-08 | DRG: 391 | Disposition: A | Discharge status: Home / Self Care | Payer: Self-pay | Attending: Internal Medicine | Admitting: Internal Medicine

## 2017-01-04 DIAGNOSIS — D649 Anemia, unspecified: Secondary | ICD-10-CM

## 2017-01-04 DIAGNOSIS — F10939 Alcohol use, unspecified with withdrawal, unspecified: Secondary | ICD-10-CM | POA: Diagnosis present

## 2017-01-04 DIAGNOSIS — L89301 Pressure ulcer of unspecified buttock, stage 1: Secondary | ICD-10-CM

## 2017-01-04 DIAGNOSIS — K209 Esophagitis, unspecified without bleeding: Secondary | ICD-10-CM | POA: Diagnosis present

## 2017-01-04 DIAGNOSIS — F17210 Nicotine dependence, cigarettes, uncomplicated: Secondary | ICD-10-CM | POA: Diagnosis present

## 2017-01-04 DIAGNOSIS — L988 Other specified disorders of the skin and subcutaneous tissue: Secondary | ICD-10-CM | POA: Diagnosis present

## 2017-01-04 DIAGNOSIS — F0390 Unspecified dementia without behavioral disturbance: Secondary | ICD-10-CM | POA: Diagnosis present

## 2017-01-04 DIAGNOSIS — E876 Hypokalemia: Secondary | ICD-10-CM | POA: Diagnosis present

## 2017-01-04 DIAGNOSIS — F10239 Alcohol dependence with withdrawal, unspecified: Secondary | ICD-10-CM | POA: Diagnosis present

## 2017-01-04 DIAGNOSIS — K6289 Other specified diseases of anus and rectum: Secondary | ICD-10-CM | POA: Diagnosis present

## 2017-01-04 DIAGNOSIS — F329 Major depressive disorder, single episode, unspecified: Secondary | ICD-10-CM | POA: Diagnosis present

## 2017-01-04 DIAGNOSIS — R6251 Failure to thrive (child): Secondary | ICD-10-CM

## 2017-01-04 DIAGNOSIS — K449 Diaphragmatic hernia without obstruction or gangrene: Secondary | ICD-10-CM | POA: Diagnosis present

## 2017-01-04 DIAGNOSIS — K922 Gastrointestinal hemorrhage, unspecified: Secondary | ICD-10-CM

## 2017-01-04 DIAGNOSIS — R1310 Dysphagia, unspecified: Secondary | ICD-10-CM | POA: Diagnosis present

## 2017-01-04 DIAGNOSIS — D62 Acute posthemorrhagic anemia: Secondary | ICD-10-CM | POA: Diagnosis present

## 2017-01-04 DIAGNOSIS — F1026 Alcohol dependence with alcohol-induced persisting amnestic disorder: Secondary | ICD-10-CM | POA: Diagnosis present

## 2017-01-04 DIAGNOSIS — E43 Unspecified severe protein-calorie malnutrition: Secondary | ICD-10-CM | POA: Diagnosis present

## 2017-01-04 DIAGNOSIS — Z79899 Other long term (current) drug therapy: Secondary | ICD-10-CM

## 2017-01-04 DIAGNOSIS — K210 Gastro-esophageal reflux disease with esophagitis: Principal | ICD-10-CM | POA: Diagnosis present

## 2017-01-04 DIAGNOSIS — Z681 Body mass index (BMI) 19 or less, adult: Secondary | ICD-10-CM

## 2017-01-04 LAB — ETHANOL: Alcohol, Ethyl (B): 40 mg/dL — ABNORMAL HIGH (ref <10)

## 2017-01-04 LAB — COMPREHENSIVE METABOLIC PANEL
ALBUMIN: 2.7 g/dL — AB (ref 3.5–5.0)
ALK PHOS: 72 U/L (ref 38–126)
ALT: 12 U/L — AB (ref 17–63)
AST: 25 U/L (ref 15–41)
Anion gap: 15 (ref 5–15)
BUN: 15 mg/dL (ref 6–20)
CALCIUM: 8.5 mg/dL — AB (ref 8.9–10.3)
CO2: 23 mmol/L (ref 22–32)
CREATININE: 0.75 mg/dL (ref 0.61–1.24)
Chloride: 94 mmol/L — ABNORMAL LOW (ref 101–111)
GFR calc Af Amer: >60 mL/min (ref >60)
GFR calc non Af Amer: >60 mL/min (ref >60)
GLUCOSE: 94 mg/dL (ref 65–99)
Potassium: 2.5 mmol/L — CL (ref 3.5–5.1)
SODIUM: 132 mmol/L — AB (ref 135–145)
Total Bilirubin: 0.9 mg/dL (ref 0.3–1.2)
Total Protein: 6.1 g/dL — ABNORMAL LOW (ref 6.5–8.1)

## 2017-01-04 LAB — CBC WITH DIFFERENTIAL/PLATELET
BASOS PCT: 0 %
Basophils Absolute: 0 10*3/uL (ref 0.0–0.1)
EOS ABS: 0 10*3/uL (ref 0.0–0.7)
Eosinophils Relative: 0 %
HCT: 24.1 % — ABNORMAL LOW (ref 39.0–52.0)
HEMOGLOBIN: 8.5 g/dL — AB (ref 13.0–17.0)
LYMPHS ABS: 1.5 10*3/uL (ref 0.7–4.0)
Lymphocytes Relative: 12 %
MCH: 32.2 pg (ref 26.0–34.0)
MCHC: 35.3 g/dL (ref 30.0–36.0)
MCV: 91.3 fL (ref 78.0–100.0)
Monocytes Absolute: 1.6 10*3/uL — ABNORMAL HIGH (ref 0.1–1.0)
Monocytes Relative: 12 %
NEUTROS PCT: 76 %
Neutro Abs: 10 10*3/uL — ABNORMAL HIGH (ref 1.7–7.7)
Platelets: 285 10*3/uL (ref 150–400)
RBC: 2.64 MIL/uL — AB (ref 4.22–5.81)
RDW: 17.5 % — ABNORMAL HIGH (ref 11.5–15.5)
WBC: 13.2 10*3/uL — AB (ref 4.0–10.5)

## 2017-01-04 LAB — POC OCCULT BLOOD, ED: Fecal Occult Bld: POSITIVE — AB

## 2017-01-04 LAB — PROTIME-INR
INR: 1.09
PROTHROMBIN TIME: 14 s (ref 11.4–15.2)

## 2017-01-04 LAB — LACTIC ACID, PLASMA
LACTIC ACID, VENOUS: 1.3 mmol/L (ref 0.5–1.9)
Lactic Acid, Venous: 0.9 mmol/L (ref 0.5–1.9)

## 2017-01-04 LAB — MAGNESIUM: MAGNESIUM: 1.3 mg/dL — AB (ref 1.7–2.4)

## 2017-01-04 MED ORDER — ENSURE ENLIVE PO LIQD
237.0000 mL | Freq: Two times a day (BID) | ORAL | Status: DC
  Administered 2017-01-05 – 2017-01-07 (×3): 237 mL via ORAL

## 2017-01-04 MED ORDER — ACETAMINOPHEN 325 MG PO TABS
650.0000 mg | ORAL_TABLET | Freq: Four times a day (QID) | ORAL | Status: DC | PRN
Start: 2017-01-04 — End: 2017-01-08
  Administered 2017-01-04 – 2017-01-08 (×7): 650 mg via ORAL
  Filled 2017-01-04 (×8): qty 2

## 2017-01-04 MED ORDER — POTASSIUM CHLORIDE IN NACL 40-0.9 MEQ/L-% IV SOLN
INTRAVENOUS | Status: DC
  Administered 2017-01-04 (×2): 75 mL/h via INTRAVENOUS
  Filled 2017-01-04 (×3): qty 1000

## 2017-01-04 MED ORDER — NICOTINE 21 MG/24HR TD PT24
21.0000 mg | MEDICATED_PATCH | Freq: Every day | TRANSDERMAL | Status: DC
  Administered 2017-01-04 – 2017-01-08 (×5): 21 mg via TRANSDERMAL
  Filled 2017-01-04 (×5): qty 1

## 2017-01-04 MED ORDER — IOPAMIDOL (ISOVUE-300) INJECTION 61%
100.0000 mL | Freq: Once | INTRAVENOUS | Status: AC | PRN
  Administered 2017-01-04: 100 mL via INTRAVENOUS

## 2017-01-04 MED ORDER — RESOURCE THICKENUP CLEAR PO POWD
ORAL | Status: DC | PRN
  Filled 2017-01-04: qty 125

## 2017-01-04 MED ORDER — MAGNESIUM SULFATE 2 GM/50ML IV SOLN
2.0000 g | Freq: Once | INTRAVENOUS | Status: AC
  Administered 2017-01-04: 2 g via INTRAVENOUS
  Filled 2017-01-04: qty 50

## 2017-01-04 MED ORDER — POLYETHYLENE GLYCOL 3350 17 G PO PACK: 17.0000 g | PACK | Freq: Every day | ORAL | Status: DC | PRN

## 2017-01-04 MED ORDER — LORAZEPAM 2 MG/ML IJ SOLN
0.0000 mg | Freq: Four times a day (QID) | INTRAMUSCULAR | Status: AC
  Administered 2017-01-05 (×2): 2 mg via INTRAVENOUS
  Filled 2017-01-04 (×2): qty 1

## 2017-01-04 MED ORDER — ACETAMINOPHEN 650 MG RE SUPP: 650.0000 mg | Freq: Four times a day (QID) | RECTAL | Status: DC | PRN

## 2017-01-04 MED ORDER — THIAMINE HCL 100 MG/ML IJ SOLN
100.0000 mg | Freq: Every day | INTRAMUSCULAR | Status: DC
  Filled 2017-01-04 (×2): qty 2

## 2017-01-04 MED ORDER — OXYCODONE HCL 5 MG PO TABS
5.0000 mg | ORAL_TABLET | ORAL | Status: DC | PRN
  Administered 2017-01-04 – 2017-01-08 (×14): 5 mg via ORAL
  Filled 2017-01-04 (×14): qty 1

## 2017-01-04 MED ORDER — FOLIC ACID 1 MG PO TABS
1.0000 mg | ORAL_TABLET | Freq: Every day | ORAL | Status: DC
  Administered 2017-01-04 – 2017-01-08 (×5): 1 mg via ORAL
  Filled 2017-01-04 (×5): qty 1

## 2017-01-04 MED ORDER — ENOXAPARIN SODIUM 40 MG/0.4ML ~~LOC~~ SOLN
40.0000 mg | SUBCUTANEOUS | Status: DC
  Administered 2017-01-04: 40 mg via SUBCUTANEOUS
  Filled 2017-01-04: qty 0.4

## 2017-01-04 MED ORDER — LORAZEPAM 2 MG/ML IJ SOLN
0.0000 mg | Freq: Two times a day (BID) | INTRAMUSCULAR | Status: AC
  Administered 2017-01-06: 1 mg via INTRAVENOUS
  Filled 2017-01-04: qty 1

## 2017-01-04 MED ORDER — GERHARDT'S BUTT CREAM
1.0000 "application " | TOPICAL_CREAM | Freq: Two times a day (BID) | CUTANEOUS | Status: DC
  Administered 2017-01-04 – 2017-01-08 (×7): 1 via TOPICAL
  Filled 2017-01-04: qty 1

## 2017-01-04 MED ORDER — POLYETHYLENE GLYCOL 3350 17 GM/SCOOP PO POWD: 1.0000 | Freq: Every day | ORAL | Status: DC | PRN

## 2017-01-04 MED ORDER — LORAZEPAM 1 MG PO TABS
0.0000 mg | ORAL_TABLET | Freq: Four times a day (QID) | ORAL | Status: AC
  Administered 2017-01-04: 2 mg via ORAL
  Administered 2017-01-04 – 2017-01-05 (×4): 1 mg via ORAL
  Filled 2017-01-04 (×2): qty 1
  Filled 2017-01-04 (×2): qty 2

## 2017-01-04 MED ORDER — PANTOPRAZOLE SODIUM 40 MG IV SOLR
40.0000 mg | Freq: Once | INTRAVENOUS | Status: AC
  Administered 2017-01-04: 40 mg via INTRAVENOUS
  Filled 2017-01-04: qty 40

## 2017-01-04 MED ORDER — LEVALBUTEROL HCL 0.63 MG/3ML IN NEBU: 0.6300 mg | INHALATION_SOLUTION | Freq: Four times a day (QID) | RESPIRATORY_TRACT | Status: DC | PRN

## 2017-01-04 MED ORDER — VITAMIN B-1 100 MG PO TABS
100.0000 mg | ORAL_TABLET | Freq: Every day | ORAL | Status: DC
  Administered 2017-01-04 – 2017-01-08 (×5): 100 mg via ORAL
  Filled 2017-01-04 (×5): qty 1

## 2017-01-04 MED ORDER — SODIUM CHLORIDE 0.9 % IV BOLUS (SEPSIS)
500.0000 mL | Freq: Once | INTRAVENOUS | Status: AC
  Administered 2017-01-04: 500 mL via INTRAVENOUS

## 2017-01-04 MED ORDER — IOPAMIDOL (ISOVUE-300) INJECTION 61%
INTRAVENOUS | Status: AC
  Administered 2017-01-04: 100 mL via INTRAVENOUS
  Filled 2017-01-04: qty 100

## 2017-01-04 MED ORDER — AMLODIPINE BESYLATE 5 MG PO TABS
5.0000 mg | ORAL_TABLET | Freq: Every day | ORAL | Status: DC
  Administered 2017-01-04: 5 mg via ORAL
  Filled 2017-01-04: qty 1

## 2017-01-04 MED ORDER — METOPROLOL SUCCINATE ER 50 MG PO TB24
50.0000 mg | ORAL_TABLET | Freq: Every day | ORAL | Status: DC
  Administered 2017-01-04: 50 mg via ORAL
  Filled 2017-01-04 (×2): qty 1

## 2017-01-04 MED ORDER — LORAZEPAM 1 MG PO TABS
0.0000 mg | ORAL_TABLET | Freq: Two times a day (BID) | ORAL | Status: AC
  Administered 2017-01-07 (×2): 1 mg via ORAL
  Filled 2017-01-04: qty 2
  Filled 2017-01-04: qty 1

## 2017-01-04 MED ORDER — HYDROCORTISONE ACETATE 25 MG RE SUPP
25.0000 mg | Freq: Two times a day (BID) | RECTAL | Status: DC
  Administered 2017-01-04 – 2017-01-08 (×8): 25 mg via RECTAL
  Filled 2017-01-04 (×11): qty 1

## 2017-01-04 MED ORDER — POTASSIUM CHLORIDE CRYS ER 20 MEQ PO TBCR
40.0000 meq | EXTENDED_RELEASE_TABLET | Freq: Three times a day (TID) | ORAL | Status: DC
  Administered 2017-01-04 (×3): 40 meq via ORAL
  Filled 2017-01-04 (×3): qty 2

## 2017-01-04 MED ORDER — SODIUM CHLORIDE 0.9 % IV BOLUS (SEPSIS)
1000.0000 mL | Freq: Once | INTRAVENOUS | Status: AC
  Administered 2017-01-04: 1000 mL via INTRAVENOUS

## 2017-01-04 MED ORDER — POTASSIUM CHLORIDE 10 MEQ/100ML IV SOLN
10.0000 meq | INTRAVENOUS | Status: AC
  Administered 2017-01-04 (×2): 10 meq via INTRAVENOUS
  Filled 2017-01-04 (×2): qty 100

## 2017-01-04 NOTE — H&P (Addendum)
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 The patient was found lying on the floor having vomited dark black material. Admitted for upper GI bleed on 9/12. GI bleeding secondary to grade D reflux esophagitis, due to alcohol abuse, seen on EGD on 9/13. Hemoglobin at that time was 6.8. Patient left AGAINST MEDICAL ADVICE , on 9/15 while he was in the process of receiving a blood transfusion.. Apparently patient was also in the ED yesterday 9/26 for left AGAINST MEDICAL ADVICE. Brought in again today 9/27 from home due to rectal pain 4 days. Patient apparently was covered in feces from waist down per EMS. Patient is confused but denied any recurrence of melena or hematochezia. Currently his main complaint is pain in the rectal area. ED course BP (!) 105/58 (BP Location: Right Arm)   Pulse (!) 120   Temp 99 F (37.2 C) (Oral)   Resp 18   SpO2 100%  Hemoglobin 8.5, platelets 285, sodium 132, potassium 2.5, CT abdomen pelvis showed chronic changes including hypodensity in the right lobe of the liver, rectal area is to have excoriation mostly on the left buttock. Patient admitted for multiple reasons including electrolyte abnormalities, inability to walk,      Review of Systems: negative for the following  Constitutional: Denies fever, chills, diaphoresis, appetite change and fatigue.  HEENT: Denies photophobia, eye pain, redness, hearing loss, ear pain, congestion, sore throat, rhinorrhea, sneezing, mouth sores, trouble swallowing, neck pain, neck stiffness and tinnitus.  Respiratory: Denies SOB,  DOE, cough, chest tightness, and wheezing.  Cardiovascular: Denies chest pain, palpitations and leg swelling.  Gastrointestinal: Positive for rectal pain. Negative for abdominal pain and vomiting. Genitourinary: Denies dysuria, urgency, frequency, hematuria, flank pain and difficulty urinating.  Musculoskeletal: Denies myalgias, back pain, joint swelling, arthralgias and gait problem.  Skin: Denies pallor, rash and wound.  Neurological: Denies dizziness, seizures, syncope, weakness, light-headedness, numbness and headaches.  Hematological: Denies adenopathy. Easy bruising, personal or family bleeding history  Psychiatric/Behavioral: Denies suicidal ideation, mood changes, confusion, nervousness, sleep disturbance and agitation       Past Medical History:  Diagnosis Date  . Colon polyps   . Depression   . Diverticulitis   . Diverticulosis of colon   . GERD (gastroesophageal reflux disease)   . Kidney stone      Past Surgical History:  Procedure Laterality Date  . COLONOSCOPY    . ESOPHAGOGASTRODUODENOSCOPY (EGD) WITH PROPOFOL N/A 12/21/2016   Procedure: ESOPHAGOGASTRODUODENOSCOPY (EGD) WITH PROPOFOL;  Surgeon: Doran Stabler, MD;  Location: [facility];  Service: Gastroenterology;  Laterality: N/A;  . UPPER GASTROINTESTINAL ENDOSCOPY        Social History:  reports that he has been smoking Cigarettes.  He has been smoking about 1.00 pack per day. He has never used smokeless tobacco. He reports that he drinks about 8.4 oz of alcohol per week . He reports that he does not use drugs.    No Known Allergies  Family History  Problem Relation Age of Onset  . Heart disease Father 24       cabg       Prior to Admission medications   Medication Sig  Start Date End Date Taking? Authorizing Provider  albuterol (VENTOLIN HFA) 108 (90 BASE) MCG/ACT inhaler Inhale 2 puffs into the lungs every 4 (four) hours as needed for wheezing or shortness of breath. 08/17/14  Yes Burchette,  Alinda Sierras, MD  amLODipine (NORVASC) 5 MG tablet Take 5 mg by mouth daily.   Yes [provider]  guaifenesin (ROBITUSSIN) 100 MG/5ML syrup Take 200-400 mg by mouth 3 (three) times daily as needed for cough or congestion.   Yes [provider]  losartan (COZAAR) 100 MG tablet Take 100 mg by mouth daily.   Yes [provider]  metoprolol succinate (TOPROL-XL) 50 MG 24 hr tablet Take 50 mg by mouth daily. Take with or immediately following a meal.   Yes [provider]  milk thistle 175 MG tablet Take 175 mg by mouth daily.   Yes [provider]  Multiple Vitamin (MULTIVITAMIN WITH MINERALS) TABS tablet Take 1 tablet by mouth daily.   Yes [provider]  Omeprazole-Sodium Bicarbonate (ZEGERID) 20-1100 MG CAPS capsule Take 1 capsule by mouth daily before breakfast.   Yes [provider]  OVER THE COUNTER MEDICATION Take 1 capsule by mouth daily. "Liver Health complex" capsules   Yes [provider]  pantoprazole (PROTONIX) 40 MG tablet Take 1 tablet (40 mg total) by mouth 2 (two) times daily. 12/01/16  Yes Short, Noah Delaine, MD  polyethylene glycol powder (GLYCOLAX/MIRALAX) powder Take 17 grams in 8 oz of water or juice daily. Patient taking differently: Take 1 Container by mouth daily as needed for mild constipation or moderate constipation. Take 17 grams in 8 oz of water or juice 02/23/15  Yes Zehr, Jessica D, PA-C  potassium chloride SA (K-DUR,KLOR-CON) 20 MEQ tablet Take 20 mEq by mouth 2 (two) times daily.   Yes [provider]  feeding supplement, ENSURE ENLIVE, (ENSURE ENLIVE) LIQD Take 237 mLs by mouth 2 (two) times daily between meals. Patient not taking: Reported on 01/04/2017 12/01/16   Janece Canterbury, MD  ferrous sulfate 325 (65 FE) MG tablet Take 1 tablet (325 mg total) by mouth 3 (three) times daily with meals. 12/01/16   Janece Canterbury, MD  folic acid (FOLVITE) 1 MG tablet Take 1 tablet (1 mg total) by mouth  daily. 12/02/16   Janece Canterbury, MD  Hydrocortisone (GERHARDT'S BUTT CREAM) CREA Apply 1 application topically 2 (two) times daily. 12/01/16   Janece Canterbury, MD  Maltodextrin-Xanthan Gum ([place]) POWD Add to liquids to make nectar thick. 12/01/16   Janece Canterbury, MD  metoprolol succinate (TOPROL-XL) 25 MG 24 hr tablet Take 0.5 tablets (12.5 mg total) by mouth daily. 12/02/16   Janece Canterbury, MD  nicotine (NICODERM CQ - DOSED IN MG/24 HOURS) 21 mg/24hr patch Place 1 patch (21 mg total) onto the skin daily. 12/02/16   Janece Canterbury, MD  thiamine 100 MG tablet Take 1 tablet (100 mg total) by mouth daily. 12/02/16   Janece Canterbury, MD     Physical Exam: Vitals:   01/04/17 0630 01/04/17 0700 01/04/17 0805 01/04/17 0838  BP: 97/63 101/63 106/68 (!) 100/58  Pulse: (!) 111 (!) 110 (!) 108 (!) 108  Resp: (!) 27 (!) 24 16 (!) 22  Temp:      TempSrc:      SpO2: 100% 100% 100% 100%        Vitals:   01/04/17 0630 01/04/17 0700 01/04/17 0805 01/04/17 0838  BP: 97/63 101/63 106/68 (!) 100/58  Pulse: (!) 111 (!) 110 (!) 108 Marland Kitchen)  108  Resp: (!) 27 (!) 24 16 (!) 22  Temp:      TempSrc:      SpO2: 100% 100% 100% 100%   Constitutional: Cachectic, frail, chronically ill appearing male  Eyes: PERRL, lids and conjunctivae normal ENMT: Mucous membranes are moist. Posterior pharynx clear of any exudate or lesions.Normal dentition.  Neck: normal, supple, no masses, no thyromegaly Respiratory: clear to auscultation bilaterally, no wheezing, no crackles. Normal respiratory effort. No accessory muscle use.  Cardiovascular: Regular rate and rhythm, no murmurs / rubs / gallops. No extremity edema. 2+ pedal pulses. No carotid bruits.  Abdomen: no tenderness, no masses palpated. No hepatosplenomegaly. Bowel sounds positive.  Musculoskeletal: no clubbing / cyanosis. No joint deformity upper and lower extremities. Good ROM, no contractures. Normal muscle tone.  Skin: Excoriations of  skin noted on left buttock  Neurologic: CN 2-12 grossly intact. Sensation intact, DTR normal. Strength 5/5 in all 4.  Psychiatric: Normal judgment and insight. Alert and oriented x 3. Normal mood.     Labs on Admission: I have personally reviewed following labs and imaging studies  CBC:  Recent Labs Lab 01/04/17 0250  WBC 13.2*  NEUTROABS 10.0*  HGB 8.5*  HCT 24.1*  MCV 91.3  PLT 779    Basic Metabolic Panel:  Recent Labs Lab 01/04/17 0250  NA 132*  K 2.5*  CL 94*  CO2 23  GLUCOSE 94  BUN 15  CREATININE 0.75  CALCIUM 8.5*  MG 1.3*    GFR: Estimated Creatinine Clearance: 79 mL/min (by C-G formula based on SCr of 0.75 mg/dL).  Liver Function Tests:  Recent Labs Lab 01/04/17 0250  AST 25  ALT 12*  ALKPHOS 72  BILITOT 0.9  PROT 6.1*  ALBUMIN 2.7*   No results for input(s): LIPASE, AMYLASE in the last 168 hours. No results for input(s): AMMONIA in the last 168 hours.  Coagulation Profile:  Recent Labs Lab 01/04/17 0250  INR 1.09   No results for input(s): DDIMER in the last 72 hours.  Cardiac Enzymes: No results for input(s): CKTOTAL, CKMB, CKMBINDEX, TROPONINI in the last 168 hours.  BNP (last 3 results) No results for input(s): PROBNP in the last 8760 hours.  HbA1C: No results for input(s): HGBA1C in the last 72 hours. Lab Results  Component Value Date   HGBA1C 5.2 12/20/2016   HGBA1C 4.7 (L) 11/25/2016     CBG: No results for input(s): GLUCAP in the last 168 hours.  Lipid Profile: No results for input(s): CHOL, HDL, LDLCALC, TRIG, CHOLHDL, LDLDIRECT in the last 72 hours.  Thyroid Function Tests: No results for input(s): TSH, T4TOTAL, FREET4, T3FREE, THYROIDAB in the last 72 hours.  Anemia Panel: No results for input(s): VITAMINB12, FOLATE, FERRITIN, TIBC, IRON, RETICCTPCT in the last 72 hours.  Urine analysis:    Component Value Date/Time   COLORURINE YELLOW 12/20/2016 1251   APPEARANCEUR HAZY (A) 12/20/2016 1251   LABSPEC  1.013 12/20/2016 1251   PHURINE 5.0 12/20/2016 1251   GLUCOSEU 50 (A) 12/20/2016 1251   HGBUR SMALL (A) 12/20/2016 1251   HGBUR negative 02/21/2010 1055   BILIRUBINUR NEGATIVE 12/20/2016 1251   BILIRUBINUR n 03/11/2014 1025   KETONESUR 20 (A) 12/20/2016 1251   PROTEINUR NEGATIVE 12/20/2016 1251   UROBILINOGEN 0.2 03/11/2014 1025   UROBILINOGEN 0.2 02/21/2010 1055   NITRITE NEGATIVE 12/20/2016 1251   LEUKOCYTESUR NEGATIVE 12/20/2016 1251    Sepsis Labs: @LABRCNTIP (procalcitonin:4,lacticidven:4) )No results found for this or any previous visit (from the past 240 hour(s)).  Radiological Exams on Admission: Ct Abdomen Pelvis W Contrast  Result Date: 01/04/2017 CLINICAL DATA:  Rectal pain and GI bleeding EXAM: CT ABDOMEN AND PELVIS WITH CONTRAST TECHNIQUE: Multidetector CT imaging of the abdomen and pelvis was performed using the standard protocol following bolus administration of intravenous contrast. CONTRAST:  100 mL Isovue-300 COMPARISON:  12/20/2016, 04/16/2013 FINDINGS: Lower chest: No acute abnormality. Moderate-sized hiatal hernia is noted. Hepatobiliary: Hypodensity is again noted in the right lobe of the liver likely representing a cyst slightly better visualized than that seen on the prior exam. This is stable from a prior ultrasound examination from 2015. Mild fatty infiltration of the liver is seen. The gallbladder again demonstrates dependent gallstones. Pancreas: Unremarkable. No pancreatic ductal dilatation or surrounding inflammatory changes. Spleen: Normal in size without focal abnormality. Adrenals/Urinary Tract: The adrenal glands are stable in appearance. The kidneys demonstrate a normal enhancement pattern. Renal cysts are noted bilaterally stable from the previous exam. The bladder is decompressed. Stomach/Bowel: Diverticular change of the colon is noted without evidence of diverticulitis. No definitive colonic mass is seen. The appendix is within normal limits. No  obstructive or inflammatory changes of the bowel are seen. Previously seen floor thickening is not well appreciated on the current exam and was likely related to peristalsis. Vascular/Lymphatic: Aortic atherosclerosis. No enlarged abdominal or pelvic lymph nodes. Reproductive: Prostate is unremarkable. Other: No abdominal wall hernia or abnormality. No abdominopelvic ascites. Musculoskeletal: Degenerative changes of lumbar spine are noted. IMPRESSION: Chronic changes similar to that seen on the recent exam. No new focal abnormality is noted. Electronically Signed   By: Inez Catalina M.D.   On: 01/04/2017 08:01   Ct Abdomen Pelvis W Contrast  Result Date: 01/04/2017 CLINICAL DATA:  Rectal pain and GI bleeding EXAM: CT ABDOMEN AND PELVIS WITH CONTRAST TECHNIQUE: Multidetector CT imaging of the abdomen and pelvis was performed using the standard protocol following bolus administration of intravenous contrast. CONTRAST:  100 mL Isovue-300 COMPARISON:  12/20/2016, 04/16/2013 FINDINGS: Lower chest: No acute abnormality. Moderate-sized hiatal hernia is noted. Hepatobiliary: Hypodensity is again noted in the right lobe of the liver likely representing a cyst slightly better visualized than that seen on the prior exam. This is stable from a prior ultrasound examination from 2015. Mild fatty infiltration of the liver is seen. The gallbladder again demonstrates dependent gallstones. Pancreas: Unremarkable. No pancreatic ductal dilatation or surrounding inflammatory changes. Spleen: Normal in size without focal abnormality. Adrenals/Urinary Tract: The adrenal glands are stable in appearance. The kidneys demonstrate a normal enhancement pattern. Renal cysts are noted bilaterally stable from the previous exam. The bladder is decompressed. Stomach/Bowel: Diverticular change of the colon is noted without evidence of diverticulitis. No definitive colonic mass is seen. The appendix is within normal limits. No obstructive or  inflammatory changes of the bowel are seen. Previously seen floor thickening is not well appreciated on the current exam and was likely related to peristalsis. Vascular/Lymphatic: Aortic atherosclerosis. No enlarged abdominal or pelvic lymph nodes. Reproductive: Prostate is unremarkable. Other: No abdominal wall hernia or abnormality. No abdominopelvic ascites. Musculoskeletal: Degenerative changes of lumbar spine are noted. IMPRESSION: Chronic changes similar to that seen on the recent exam. No new focal abnormality is noted. Electronically Signed   By: Inez Catalina M.D.   On: 01/04/2017 08:01   Ct Abdomen Pelvis W Contrast  Result Date: 12/20/2016 CLINICAL DATA:  [place] when trying to answer the door, covered in coffee-ground emesis, abdominal pain starting yesterday EXAM: CT ABDOMEN AND PELVIS WITH CONTRAST TECHNIQUE: Multidetector  CT imaging of the abdomen and pelvis was performed using the standard protocol following bolus administration of intravenous contrast. Sagittal and coronal MPR images reconstructed from axial data set. CONTRAST:  165mL ISOVUE-300 IOPAMIDOL (ISOVUE-300) INJECTION 61% IV. Water given as oral contrast. COMPARISON:  None FINDINGS: Lower chest: Minimal atelectasis at RIGHT lung base. Hepatobiliary: Diffuse fatty infiltration of liver. Nonspecific 13 x 9 mm low-attenuation lesion RIGHT lobe liver, slightly ill-defined, without enhancement or washout. Liver otherwise unremarkable. Dependent density in gallbladder question small gallstone. Pancreas: Normal appearance Spleen: Normal appearance Adrenals/Urinary Tract: Adrenal glands normal appearance. BILATERAL renal cysts largest lateral mid RIGHT kidney 3.0 x 2.8 cm image 28. Kidneys, ureters and bladder otherwise normal appearance Stomach/Bowel: Moderate sized hiatal hernia. Stomach distended by fluid. Wall thickening of the pylorus, cannot exclude component of gastric outlet obstruction. No adjacent infiltrative changes to suggest  penetrating ulcer disease. Descending and sigmoid diverticulosis without evidence of diverticulitis. Normal appendix. Small bowel loops unremarkable. Vascular/Lymphatic: Atherosclerotic calcifications aorta and iliac arteries without aneurysm. No adenopathy. Reproductive: Prostatic calcifications and minimal enlargement. Unremarkable seminal vesicles. Other: No free air or free fluid. No definite inflammatory process. No hernia. Musculoskeletal: Diffuse osseous demineralization. IMPRESSION: Distended stomach with a moderate sized hiatal hernia and thickening of the pylorus raising question of gastric outlet obstruction. Distal colonic diverticulosis without evidence of diverticulitis. Fatty infiltration of liver with a nonspecific slightly ill-defined 13 x 9 mm lesion in the RIGHT lobe ; followup nonemergent characterization by MR imaging recommended. Cholelithiasis. Electronically Signed   By: Lavonia Dana M.D.   On: 12/20/2016 15:18   Dg Chest Port 1 View  Result Date: 12/20/2016 CLINICAL DATA:  Fever.  History of tobacco abuse. EXAM: PORTABLE CHEST 1 VIEW COMPARISON:  Chest radiograph November 29, 2016 FINDINGS: Cardiac silhouette is mildly enlarged. Mediastinal silhouette is nonsuspicious, calcified aortic knob. Calcified aortic knob. No pleural effusion or focal consolidation. Increased lung volumes. No pneumothorax. Moderate hiatal hernia. Old RIGHT rib fractures. Soft tissue planes are nonsuspicious. IMPRESSION: 1. Mild cardiomegaly and COPD. 2. Moderate hiatal hernia. Electronically Signed   By: Elon Alas M.D.   On: 12/20/2016 21:39      EKG: Independently reviewed. *   Assessment/Plan Principal Problem: Reflux esophagitis secondary to alcohol abuse seen on EGD on 9/30 Continue PPI Hemoglobin stable Patient states that he has not had alcohol in several days  Rectal pain Likely secondary to skin excoriations, will order woc , ct does not show any acute  pathology  Hypokalemia-replete  Blood loss anemia, hemoglobin currently stable, monitor closely  Hypomagnesemia, 1.3, replete  Alcohol abuse, chart indicates diagnosis of Korsakoff syndrome -Minimal use of Ativan. No evidence of acute withdrawal at this point. Continue CIWA.  Cigarette smoker -Nicotine patch  Dysphagia. Continue dysphagia 1 diet with nectar thick liquids as previously recommended  DVT prophylaxis:  Lovenox     Code Status Orders full code        consults called:  Family Communication: Admission, patients condition and plan of care including tests being ordered have been discussed with the patient  who indicates understanding and agree with the plan and Code Status  Admission status: inpatient    Disposition plan: Further plan will depend as patient's clinical course evolves and further radiologic and laboratory data become available. Likely home when stable   At the time of admission, it appears that the appropriate admission status for this patient is INPATIENT .Thisis judged to be reasonable and necessary in order to provide the required intensity of  service to ensure the patient's safetygiven thepresenting symptoms, physical exam findings, and initial radiographic and laboratory data in the context of their chronic comorbidities.   Reyne Dumas MD [facility] Pager [phone]  If 7PM-7AM, please contact night-coverage [email or web address] Password [email or web address]  01/04/2017, 9:07 AM

## 2017-01-04 NOTE — ED Notes (Signed)
Report attempted at time.

## 2017-01-04 NOTE — ED Triage Notes (Signed)
Per EMS, pt from home, reports rectal pain x 4 days.  Was seen for same x 2 days ago.  Pt does not recall what could cause his rectal pain.  Pt is covered in feces from his waist down per EMS.  Pt is A&O x 4.  However, he kept saying "I don't remember" when asked HPI.

## 2017-01-04 NOTE — ED Notes (Signed)
Gregary Signs [phone] Report scheduled 1520

## 2017-01-04 NOTE — ED Provider Notes (Signed)
[place] DEPT Provider Note   CSN: [id number] Arrival date & time: 01/04/17  0023     History   Chief Complaint Chief Complaint  Patient presents with  . Rectal Pain    HPI Patrick [name] is a 66 y.o. male.  Patient with history of recent upper GI bleed, alcohol dependence, HTN presents with 4 days of rectal pain. He is not aware of any bleeding or melena. The patient seems unable to give a detailed history and repeats "I don't remember" often. He denies any abdominal pain or vomiting. No known fever. The patient presents covered in feces. He denies recent alcohol use.    The history is provided by the patient and the EMS personnel. No language interpreter was used.    Past Medical History:  Diagnosis Date  . Colon polyps   . Depression   . Diverticulitis   . Diverticulosis of colon   . GERD (gastroesophageal reflux disease)   . Kidney stone     Patient Active Problem List   Diagnosis Date Noted  . Upper GI bleed 12/20/2016  . AKI (acute kidney injury) ([place]) 12/20/2016  . 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 Encounter for palliative care   . Goals of care, counseling/discussion   . Dysphagia 11/30/2016  . Symptomatic anemia 11/25/2016  . Pressure ulcer of sacral region, stage 2 11/25/2016  . Alcohol abuse 11/25/2016  . Hypotension 11/25/2016  . Hypomagnesemia 11/25/2016  . Hyponatremia 11/25/2016  . Non-traumatic rhabdomyolysis 11/25/2016  . Erosive esophagitis 05/14/2015  . Hypokalemia 04/30/2015  . Thrombocytopenia ([place]) 04/16/2015  . Alcohol dependence ([place]) 03/17/2015  . Change in bowel habits 02/23/2015  . Rectal bleeding 02/23/2015  . Constipation 02/23/2015  . Transaminasemia 03/27/2013  . Essential tremor 03/27/2013  . Hypertension 07/19/2012  . TOBACCO USE 03/01/2010  . DIVERTICULOSIS, COLON 05/14/2007  . DEPRESSION 11/05/2006  . COLONIC POLYPS, HX OF 11/05/2006    Past Surgical History:  Procedure Laterality Date  .  COLONOSCOPY    . ESOPHAGOGASTRODUODENOSCOPY (EGD) WITH PROPOFOL N/A 12/21/2016   Procedure: ESOPHAGOGASTRODUODENOSCOPY (EGD) WITH PROPOFOL;  Surgeon: Doran Stabler, MD;  Location: [facility] ENDOSCOPY;  Service: Gastroenterology;  Laterality: N/A;  . UPPER GASTROINTESTINAL ENDOSCOPY         Home Medications    Prior to Admission medications   Medication Sig Start Date End Date Taking? Authorizing Provider  albuterol (VENTOLIN HFA) 108 (90 BASE) MCG/ACT inhaler Inhale 2 puffs into the lungs every 4 (four) hours as needed for wheezing or shortness of breath. 08/17/14   Burchette, Alinda Sierras, MD  amLODipine (NORVASC) 5 MG tablet Take 5 mg by mouth daily.    [provider]  feeding supplement, ENSURE ENLIVE, (ENSURE ENLIVE) LIQD Take 237 mLs by mouth 2 (two) times daily between meals. 12/01/16   Janece Canterbury, MD  ferrous sulfate 325 (65 FE) MG tablet Take 1 tablet (325 mg total) by mouth 3 (three) times daily with meals. 12/01/16   Janece Canterbury, MD  folic acid (FOLVITE) 1 MG tablet Take 1 tablet (1 mg total) by mouth daily. 12/02/16   Janece Canterbury, MD  Hydrocortisone (GERHARDT'S BUTT CREAM) CREA Apply 1 application topically 2 (two) times daily. 12/01/16   Janece Canterbury, MD  losartan (COZAAR) 100 MG tablet Take 100 mg by mouth daily.    [provider]  Maltodextrin-Xanthan Gum ([place]) POWD Add to liquids to make nectar thick. 12/01/16   Janece Canterbury, MD  metoprolol succinate (TOPROL-XL) 25 MG  24 hr tablet Take 0.5 tablets (12.5 mg total) by mouth daily. 12/02/16   Janece Canterbury, MD  nicotine (NICODERM CQ - DOSED IN MG/24 HOURS) 21 mg/24hr patch Place 1 patch (21 mg total) onto the skin daily. 12/02/16   Janece Canterbury, MD  pantoprazole (PROTONIX) 40 MG tablet Take 1 tablet (40 mg total) by mouth 2 (two) times daily. 12/01/16   Janece Canterbury, MD  polyethylene glycol powder (GLYCOLAX/MIRALAX) powder Take 17 grams in 8 oz of water or juice  daily. Patient taking differently: Take 1 Container by mouth daily as needed for mild constipation or moderate constipation. Take 17 grams in 8 oz of water or juice 02/23/15   Zehr, Jessica D, PA-C  thiamine 100 MG tablet Take 1 tablet (100 mg total) by mouth daily. 12/02/16   Janece Canterbury, MD    Family History Family History  Problem Relation Age of Onset  . Heart disease Father 28       cabg    Social History Social History  Substance Use Topics  . Smoking status: Current Every Day Smoker    Packs/day: 1.00    Types: Cigarettes  . Smokeless tobacco: Never Used  . Alcohol use 8.4 oz/week    14 Glasses of wine per week     Allergies   Patient has no known allergies.   Review of Systems Review of Systems  Reason unable to perform ROS: Patient is considered an unreliable historian but reports the following:  Constitutional: Negative for chills and fever.  Respiratory: Negative.  Negative for shortness of breath.   Cardiovascular: Negative.  Negative for chest pain.  Gastrointestinal: Positive for rectal pain. Negative for abdominal pain and vomiting.  Genitourinary: Negative.   Musculoskeletal: Negative.      Physical Exam Updated Vital Signs BP (!) 105/58 (BP Location: Right Arm)   Pulse (!) 120   Temp 99 F (37.2 C) (Oral)   Resp 18   SpO2 100%   Physical Exam  Constitutional: He appears well-developed.  Cachectic, frail, chronically ill appearing male  HENT:  Head: Normocephalic.  Eyes:  Conjunctival pallor   Neck: Normal range of motion. Neck supple.  Cardiovascular: Regular rhythm.  Tachycardia present.   Pulmonary/Chest: Effort normal. He has no wheezes. He has rales. He exhibits no tenderness.  Abdominal: Soft. Bowel sounds are normal. There is no tenderness. There is no rebound and no guarding.  Musculoskeletal: Normal range of motion.  Neurological: He is alert. No cranial nerve deficit.  Skin: Skin is warm and dry. No rash noted.  Psychiatric:  He has a normal mood and affect.     ED Treatments / Results  Labs (all labs ordered are listed, but only abnormal results are displayed) Labs Reviewed  CBC WITH DIFFERENTIAL/PLATELET  COMPREHENSIVE METABOLIC PANEL  ETHANOL  PROTIME-INR  POC OCCULT BLOOD, ED  TYPE AND SCREEN   Results for orders placed or performed during the hospital encounter of 01/04/17  CBC with Differential  Result Value Ref Range   WBC 13.2 (H) 4.0 - 10.5 K/uL   RBC 2.64 (L) 4.22 - 5.81 MIL/uL   Hemoglobin 8.5 (L) 13.0 - 17.0 g/dL   HCT 24.1 (L) 39.0 - 52.0 %   MCV 91.3 78.0 - 100.0 fL   MCH 32.2 26.0 - 34.0 pg   MCHC 35.3 30.0 - 36.0 g/dL   RDW 17.5 (H) 11.5 - 15.5 %   Platelets 285 150 - 400 K/uL   Neutrophils Relative % 76 %  Neutro Abs 10.0 (H) 1.7 - 7.7 K/uL   Lymphocytes Relative 12 %   Lymphs Abs 1.5 0.7 - 4.0 K/uL   Monocytes Relative 12 %   Monocytes Absolute 1.6 (H) 0.1 - 1.0 K/uL   Eosinophils Relative 0 %   Eosinophils Absolute 0.0 0.0 - 0.7 K/uL   Basophils Relative 0 %   Basophils Absolute 0.0 0.0 - 0.1 K/uL  Comprehensive metabolic panel  Result Value Ref Range   Sodium 132 (L) 135 - 145 mmol/L   Potassium 2.5 (LL) 3.5 - 5.1 mmol/L   Chloride 94 (L) 101 - 111 mmol/L   CO2 23 22 - 32 mmol/L   Glucose, Bld 94 65 - 99 mg/dL   BUN 15 6 - 20 mg/dL   Creatinine, Ser 0.75 0.61 - 1.24 mg/dL   Calcium 8.5 (L) 8.9 - 10.3 mg/dL   Total Protein 6.1 (L) 6.5 - 8.1 g/dL   Albumin 2.7 (L) 3.5 - 5.0 g/dL   AST 25 15 - 41 U/L   ALT 12 (L) 17 - 63 U/L   Alkaline Phosphatase 72 38 - 126 U/L   Total Bilirubin 0.9 0.3 - 1.2 mg/dL   GFR calc non Af Amer >60 >60 mL/min   GFR calc Af Amer >60 >60 mL/min   Anion gap 15 5 - 15  Ethanol  Result Value Ref Range   Alcohol, Ethyl (B) 40 (H) <10 mg/dL  Protime-INR  Result Value Ref Range   Prothrombin Time 14.0 11.4 - 15.2 seconds   INR 1.09   Lactic acid, plasma  Result Value Ref Range   Lactic Acid, Venous 0.9 0.5 - 1.9 mmol/L  Type and  screen [place]  Result Value Ref Range   ABO/RH(D) A POS    Antibody Screen NEG    Sample Expiration 01/07/2017     EKG  EKG Interpretation None       Radiology No results found.  Procedures Procedures (including critical care time)  Medications Ordered in ED Medications  sodium chloride 0.9 % bolus 500 mL (not administered)     Initial Impression / Assessment and Plan / ED Course  I have reviewed the triage vital signs and the nursing notes.  Pertinent labs & imaging results that were available during my care of the patient were reviewed by me and considered in my medical decision making (see chart for details).     Patient seen at 12:50 - orders placed. Chart reviewed. Recent admission for upper GI bleed secondary to alcohol use. He ultimately signed out AMA. Returns today with complaint blood per rectum and rectal pain. He appears pale, is tachycardic and mildly hypotensive. Awake and alert. Does not appear intoxicated.  02:50 - orders still pending. Moved to [facility].   3:10 - CBC resulted showing hgb of 8.5. Last comparable 6.5  3:45 - CMET resulted showing hypokalemia of 2.5, low Na, chloride, protein. Patient continues to be tachycardic despite fluid resuscitation. BP sporadic. The patient remains awake and responsive. No new complaint. Hemoccult positive, brown stool.   Patient placed on CIWA protocol given history of alcohol dependence.  The patient will be admitted. Lactic acid pending. Multiple re-evaluations, the patient remains unchanged.  Discussed admission. The patient reports he is willing to stay after leaving AMA on last admission.   [facility] paged for admission, Dr. Hal Hope accepting. CT abd/pel w/CM ordered per his request.     Final Clinical Impressions(s) / ED Diagnoses   Final diagnoses:  None  1. Anemia 2. Gi bleeding 3. Hypokalemia 4. Failure to thrive  New Prescriptions New Prescriptions   No medications on  file     Charlann Lange, Hershal Coria 01/04/17 0723    Charlann Lange, PA-C 77/03/40 3524    Delora Fuel, MD 81/85/90 [phone]

## 2017-01-04 NOTE — Care Management Note (Signed)
Case Management Note  Patient Details  Name: Patrick [name] MRN: [id number] Date of Birth: Jan 21, 1951  Subjective/Objective:66 y/o m admitted w/Reflux esophagitis. From home. Readmit-GIB.                    Action/Plan:d/c plan home.   Expected Discharge Date:   (UNKNOWN)               Expected Discharge Plan:  Home/Self Care  In-House Referral:  Clinical Social Work  Discharge planning Services  CM Consult  Post Acute Care Choice:    Choice offered to:     DME Arranged:    DME Agency:     HH Arranged:    HH Agency:     Status of Service:  In process, will continue to follow  If discussed at Long Length of Stay Meetings, dates discussed:    Additional Comments:  Dessa Phi, RN 01/04/2017, 4:08 PM

## 2017-01-05 DIAGNOSIS — K6289 Other specified diseases of anus and rectum: Secondary | ICD-10-CM

## 2017-01-05 DIAGNOSIS — D649 Anemia, unspecified: Secondary | ICD-10-CM

## 2017-01-05 DIAGNOSIS — E876 Hypokalemia: Secondary | ICD-10-CM

## 2017-01-05 DIAGNOSIS — R627 Adult failure to thrive: Secondary | ICD-10-CM

## 2017-01-05 DIAGNOSIS — K922 Gastrointestinal hemorrhage, unspecified: Secondary | ICD-10-CM

## 2017-01-05 DIAGNOSIS — K209 Esophagitis, unspecified: Secondary | ICD-10-CM

## 2017-01-05 LAB — COMPREHENSIVE METABOLIC PANEL
ALT: 8 U/L — AB (ref 17–63)
AST: 18 U/L (ref 15–41)
Albumin: 2 g/dL — ABNORMAL LOW (ref 3.5–5.0)
Alkaline Phosphatase: 51 U/L (ref 38–126)
Anion gap: 5 (ref 5–15)
BILIRUBIN TOTAL: 0.6 mg/dL (ref 0.3–1.2)
BUN: 8 mg/dL (ref 6–20)
CALCIUM: 8.3 mg/dL — AB (ref 8.9–10.3)
CO2: 22 mmol/L (ref 22–32)
CREATININE: 0.62 mg/dL (ref 0.61–1.24)
Chloride: 110 mmol/L (ref 101–111)
Glucose, Bld: 115 mg/dL — ABNORMAL HIGH (ref 65–99)
Potassium: 5.5 mmol/L — ABNORMAL HIGH (ref 3.5–5.1)
Sodium: 137 mmol/L (ref 135–145)
TOTAL PROTEIN: 4.9 g/dL — AB (ref 6.5–8.1)

## 2017-01-05 LAB — CBC
HEMATOCRIT: 15.8 % — AB (ref 39.0–52.0)
Hemoglobin: 5.4 g/dL — CL (ref 13.0–17.0)
MCH: 32 pg (ref 26.0–34.0)
MCHC: 34.2 g/dL (ref 30.0–36.0)
MCV: 93.5 fL (ref 78.0–100.0)
PLATELETS: 246 10*3/uL (ref 150–400)
RBC: 1.69 MIL/uL — AB (ref 4.22–5.81)
RDW: 18.3 % — ABNORMAL HIGH (ref 11.5–15.5)
WBC: 5.7 10*3/uL (ref 4.0–10.5)

## 2017-01-05 LAB — HEMOGLOBIN AND HEMATOCRIT, BLOOD
HCT: 25.7 % — ABNORMAL LOW (ref 39.0–52.0)
Hemoglobin: 8.7 g/dL — ABNORMAL LOW (ref 13.0–17.0)

## 2017-01-05 LAB — MAGNESIUM: Magnesium: 1.6 mg/dL — ABNORMAL LOW (ref 1.7–2.4)

## 2017-01-05 LAB — PREPARE RBC (CROSSMATCH)

## 2017-01-05 MED ORDER — HYDROCORTISONE 2.5 % RE CREA
TOPICAL_CREAM | Freq: Four times a day (QID) | RECTAL | Status: DC | PRN
  Administered 2017-01-08: 10:00:00 via RECTAL
  Filled 2017-01-05: qty 28.35

## 2017-01-05 MED ORDER — ADULT MULTIVITAMIN W/MINERALS CH
1.0000 | ORAL_TABLET | Freq: Every day | ORAL | Status: DC
  Administered 2017-01-05 – 2017-01-08 (×4): 1 via ORAL
  Filled 2017-01-05 (×4): qty 1

## 2017-01-05 MED ORDER — MAGNESIUM SULFATE 4 GM/100ML IV SOLN
4.0000 g | Freq: Once | INTRAVENOUS | Status: AC
  Administered 2017-01-05: 4 g via INTRAVENOUS
  Filled 2017-01-05 (×2): qty 100

## 2017-01-05 MED ORDER — SODIUM CHLORIDE 0.9 % IV SOLN
Freq: Once | INTRAVENOUS | Status: AC
  Administered 2017-01-05: 11:00:00 via INTRAVENOUS

## 2017-01-05 MED ORDER — PANTOPRAZOLE SODIUM 40 MG IV SOLR
40.0000 mg | Freq: Two times a day (BID) | INTRAVENOUS | Status: DC
  Administered 2017-01-05 (×2): 40 mg via INTRAVENOUS
  Filled 2017-01-05 (×3): qty 40

## 2017-01-05 MED ORDER — DIBUCAINE 1 % RE OINT
TOPICAL_OINTMENT | RECTAL | Status: DC | PRN
  Filled 2017-01-05: qty 28

## 2017-01-05 MED ORDER — METOPROLOL TARTRATE 25 MG PO TABS
12.5000 mg | ORAL_TABLET | Freq: Two times a day (BID) | ORAL | Status: DC
  Administered 2017-01-05 – 2017-01-08 (×6): 12.5 mg via ORAL
  Filled 2017-01-05 (×6): qty 1

## 2017-01-05 MED ORDER — SODIUM CHLORIDE 0.9 % IV SOLN
INTRAVENOUS | Status: DC
  Administered 2017-01-05 – 2017-01-07 (×3): via INTRAVENOUS

## 2017-01-05 NOTE — Progress Notes (Signed)
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 The patient was found lying on the floor having vomited dark black material. Admitted for upper GI bleed on 9/12. GI bleeding secondary to grade D reflux esophagitis, due to alcohol abuse, seen on EGD on 9/13. Hemoglobin at that time was 6.8. Patient left AGAINST MEDICAL ADVICE , on 9/15 while he was in the process of receiving a blood transfusion.. Apparently patient was also in the ED yesterday 9/26 for left AGAINST MEDICAL ADVICE. Brought in again today 9/27 from home due to rectal pain 4 days. Patient apparently was covered in feces from waist down per EMS. Patient is confused but denied any recurrence of melena or hematochezia. Currently his main complaint is pain in the rectal area  Assessment & Plan:   Principal Problem:   Esophagitis Active Problems:   Alcohol withdrawal ([place])  Principal Problem: Reflux esophagitis secondary to alcohol abuse seen on EGD on 9/30 -we'll continue patient on twice a day PPI -Patient with recent endoscopy demonstrating reflux esophagitis which is likely contributing to recent upper GI bleed -Presenting hemoglobin 8.5, down to 5.5 this morning. -Transfused 2 units of PRBCs -Given concerns of continued acute blood loss anemia, will consult GI for any further recommendations at this time. -Repeat CBC in the morning -Discontinue Lovenox   Rectal pain -CT pelvis unremarkable for acute process -We'll give trial of topical lidocaine with Anusol.  Hypokalemia -Will replace basic metabolic panel in themorning  Acute Blood loss anemia -patient down 3 g of hemoglobin  overnight. -transfuse 2 units of PRBCs, repeat CBC in morning  Hypomagnesemia, 1.3 with repeat level of 1.6 -replaced magnesium  Alcohol abuse, chart indicates diagnosis of Korsakoff syndrome -Minimal use of Ativan. No evidence of acute withdrawal at this point. Continue CIWA as tolerated.  Cigarette smoker -Nicotine patch  Dysphagia. Continue dysphagia 1 diet with nectar thick liquids as previously recommended  DVT prophylaxis: SCD's Code Status: Full Family Communication: Pt in room, family not at bedside Disposition Plan: Uncertain at this time  Consultants:   GI  Procedures:     Antimicrobials: Anti-infectives    None       Subjective: Complaining of rectal pain  Objective: Vitals:   01/05/17 1000 01/05/17 1116 01/05/17 1240 01/05/17 1255  BP: 99/61 96/67 102/66 (!) 95/59  Pulse: 96 97 99 99  Resp:   20 19  Temp:   98.1 F (36.7 C) 98.7 F (37.1 C)  TempSrc:   Oral Oral  SpO2:      Weight:      Height:        Intake/Output Summary (Last 24 hours) at 01/05/17 1407 Last data filed at 01/05/17 0900  Gross per 24 hour  Intake          1368.75 ml  Output              100 ml  Net          1268.75 ml   Filed Weights   01/05/17 0422  Weight: 56.4 kg (124 lb 6.4 oz)    Examination:  General exam: Appears calm and comfortable  Respiratory system: Clear to auscultation. Respiratory effort normal. Cardiovascular system: S1 & S2 heard,  RRR Gastrointestinal system: Abdomen is nondistended, soft and nontender. No organomegaly or masses felt. Normal bowel sounds heard. Central nervous system: Alert and oriented. No focal neurological deficits. Extremities: Symmetric 5 x 5 power. Skin: No rashes, lesions  Psychiatry: Judgement and insight appear normal. Mood & affect appropriate.   Data Reviewed: I have personally reviewed following labs and imaging studies  CBC:  Recent Labs Lab 01/04/17 0250 01/05/17 0448  WBC 13.2* 5.7  NEUTROABS 10.0*  --    HGB 8.5* 5.4*  HCT 24.1* 15.8*  MCV 91.3 93.5  PLT 285 132   Basic Metabolic Panel:  Recent Labs Lab 01/04/17 0250 01/05/17 0448  NA 132* 137  K 2.5* 5.5*  CL 94* 110  CO2 23 22  GLUCOSE 94 115*  BUN 15 8  CREATININE 0.75 0.62  CALCIUM 8.5* 8.3*  MG 1.3* 1.6*   GFR: Estimated Creatinine Clearance: 74.4 mL/min (by C-G formula based on SCr of 0.62 mg/dL). Liver Function Tests:  Recent Labs Lab 01/04/17 0250 01/05/17 0448  AST 25 18  ALT 12* 8*  ALKPHOS 72 51  BILITOT 0.9 0.6  PROT 6.1* 4.9*  ALBUMIN 2.7* 2.0*   No results for input(s): LIPASE, AMYLASE in the last 168 hours. No results for input(s): AMMONIA in the last 168 hours. Coagulation Profile:  Recent Labs Lab 01/04/17 0250  INR 1.09   Cardiac Enzymes: No results for input(s): CKTOTAL, CKMB, CKMBINDEX, TROPONINI in the last 168 hours. BNP (last 3 results) No results for input(s): PROBNP in the last 8760 hours. HbA1C: No results for input(s): HGBA1C in the last 72 hours. CBG: No results for input(s): GLUCAP in the last 168 hours. Lipid Profile: No results for input(s): CHOL, HDL, LDLCALC, TRIG, CHOLHDL, LDLDIRECT in the last 72 hours. Thyroid Function Tests: No results for input(s): TSH, T4TOTAL, FREET4, T3FREE, THYROIDAB in the last 72 hours. Anemia Panel: No results for input(s): VITAMINB12, FOLATE, FERRITIN, TIBC, IRON, RETICCTPCT in the last 72 hours. Sepsis Labs:  Recent Labs Lab 01/04/17 0512 01/04/17 1608  LATICACIDVEN 0.9 1.3    No results found for this or any previous visit (from the past 240 hour(s)).   Radiology Studies: Ct Abdomen Pelvis W Contrast  Result Date: 01/04/2017 CLINICAL DATA:  Rectal pain and GI bleeding EXAM: CT ABDOMEN AND PELVIS WITH CONTRAST TECHNIQUE: Multidetector CT imaging of the abdomen and pelvis was performed using the standard protocol following bolus administration of intravenous contrast. CONTRAST:  100 mL Isovue-300 COMPARISON:  12/20/2016,  04/16/2013 FINDINGS: Lower chest: No acute abnormality. Moderate-sized hiatal hernia is noted. Hepatobiliary: Hypodensity is again noted in the right lobe of the liver likely representing a cyst slightly better visualized than that seen on the prior exam. This is stable from a prior ultrasound examination from 2015. Mild fatty infiltration of the liver is seen. The gallbladder again demonstrates dependent gallstones. Pancreas: Unremarkable. No pancreatic ductal dilatation or surrounding inflammatory changes. Spleen: Normal in size without focal abnormality. Adrenals/Urinary Tract: The adrenal glands are stable in appearance. The kidneys demonstrate a normal enhancement pattern. Renal cysts are noted bilaterally stable from the previous exam. The bladder is decompressed. Stomach/Bowel: Diverticular change of the colon is noted without evidence of diverticulitis. No definitive colonic mass is seen. The appendix is within normal limits. No obstructive or inflammatory changes of the bowel are seen. Previously seen floor thickening is not well appreciated on the current exam and was likely related to peristalsis. Vascular/Lymphatic: Aortic atherosclerosis. No enlarged abdominal or pelvic lymph nodes. Reproductive: Prostate  is unremarkable. Other: No abdominal wall hernia or abnormality. No abdominopelvic ascites. Musculoskeletal: Degenerative changes of lumbar spine are noted. IMPRESSION: Chronic changes similar to that seen on the recent exam. No new focal abnormality is noted. Electronically Signed   By: Inez Catalina M.D.   On: 01/04/2017 08:01    Scheduled Meds: . feeding supplement (ENSURE ENLIVE)  237 mL Oral BID BM  . folic acid  1 mg Oral Daily  . Gerhardt's butt cream  1 application Topical BID  . hydrocortisone  25 mg Rectal BID  . LORazepam  0-4 mg Intravenous Q6H   Or  . LORazepam  0-4 mg Oral Q6H  . [START ON 01/06/2017] LORazepam  0-4 mg Intravenous Q12H   Or  . [START ON 01/06/2017] LORazepam   0-4 mg Oral Q12H  . metoprolol tartrate  12.5 mg Oral BID  . nicotine  21 mg Transdermal Daily  . pantoprazole (PROTONIX) IV  40 mg Intravenous Q12H  . thiamine  100 mg Oral Daily   Or  . thiamine  100 mg Intravenous Daily   Continuous Infusions: . sodium chloride       LOS: 1 day   Vallorie Niccoli, Orpah Melter, MD [facility] Pager [phone]  If 7PM-7AM, please contact night-coverage [email or web address] Password [email or web address] 01/05/2017, 2:07 PM

## 2017-01-05 NOTE — Evaluation (Signed)
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 The patient was found lying on the floor having vomited dark black material. Admitted for upper GI bleed on 9/12. GI bleeding secondary to grade D reflux esophagitis, due to alcohol abuse, seen on EGD on 9/13. Hemoglobin at that time was 6.8. Patient left AGAINST MEDICAL ADVICE , on 9/15 while he was in the process of receiving a blood transfusion.. Apparently patient was also in the ED  9/26 for left AGAINST MEDICAL ADVICE. Brought in again  9/27 from home due to rectal pain 4 days. Dx of esophagitis, anemia (Hgb 5.5), rectal pain.   Clinical Impression  Pt admitted with above diagnosis. Pt currently with functional limitations due to the deficits listed below (see PT Problem List).  Min assist for transfers, ambulation deferred 2* dizziness in sitting/standing. Noted Hgb 5.5 this morning, pt has had 1st of 2 units of blood. Will assess ambulation next visit.  Pt will benefit from skilled PT to increase their independence and safety with mobility to allow discharge to the venue listed below.       Follow Up Recommendations SNF;Supervision/Assistance - 24 hour    Equipment Recommendations  None recommended by PT    Recommendations for Other Services       Precautions / Restrictions Precautions Precautions: Fall Restrictions Weight Bearing Restrictions: No      Mobility  Bed Mobility   Bed Mobility: Supine to Sit     Supine to sit: HOB elevated;Supervision     General bed mobility comments: with rail  Transfers Overall transfer level: Needs assistance Equipment used:  Rolling walker (2 wheeled) Transfers: Sit to/from [facility] Sit to Stand: Min assist Stand pivot transfers: Min guard       General transfer comment: min A to rise, min/guard for SPT, gait deferred 2* dizziness; wide BOS with pivotal steps to recliner  Ambulation/Gait                Stairs            Wheelchair Mobility    Modified Rankin (Stroke Patients Only)       Balance Overall balance assessment: Needs assistance Sitting-balance support: Feet supported Sitting balance-Leahy Scale: Good     Standing balance support: Bilateral upper extremity supported Standing balance-Leahy Scale: Poor Standing balance comment: requires UE support                             Pertinent Vitals/Pain Pain Assessment: Faces Faces Pain Scale: Hurts even more Pain Location: rectal area Pain Descriptors / Indicators: Sore Pain Intervention(s): Limited activity within patient's tolerance;Monitored during session;Premedicated before session    Home Living Family/patient expects to be discharged to:: Private residence Living Arrangements: Alone           Home Layout: Two level;1/2 bath on main level;Bed/bath upstairs Home Equipment: Walker - 2 wheels      Prior Function Level of Independence: Independent with assistive device(s)         Comments: reports using RW sometimes, states he hasn't gone to second floor in home for a couple weeks as he cannot manage the stairs  Hand Dominance        Extremity/Trunk Assessment   Upper Extremity Assessment Upper Extremity Assessment: Generalized weakness    Lower Extremity Assessment Lower Extremity Assessment: Generalized weakness (B knee ext 4/5)    Cervical / Trunk Assessment Cervical / Trunk Assessment: Kyphotic  Communication   Communication: No difficulties  Cognition Arousal/Alertness: Awake/alert Behavior During Therapy: Flat affect Overall Cognitive Status: Within  Functional Limits for tasks assessed                                        General Comments      Exercises     Assessment/Plan    PT Assessment Patient needs continued PT services  PT Problem List Decreased strength;Decreased activity tolerance;Decreased balance;Decreased mobility;Decreased knowledge of precautions;Decreased safety awareness;Decreased knowledge of use of DME;Decreased cognition       PT Treatment Interventions DME instruction;Gait training;Functional mobility training;Therapeutic activities;Patient/family education    PT Goals (Current goals can be found in the Care Plan section)  Acute Rehab PT Goals Patient Stated Goal: none stated PT Goal Formulation: With patient Time For Goal Achievement: 01/19/17 Potential to Achieve Goals: Fair    Frequency Min 3X/week   Barriers to discharge        Co-evaluation               AM-PAC PT "6 Clicks" Daily Activity  Outcome Measure Difficulty turning over in bed (including adjusting bedclothes, sheets and blankets)?: A Little Difficulty moving from lying on back to sitting on the side of the bed? : A Little Difficulty sitting down on and standing up from a chair with arms (e.g., wheelchair, bedside commode, etc,.)?: Unable Help needed moving to and from a bed to chair (including a wheelchair)?: A Little Help needed walking in hospital room?: Total Help needed climbing 3-5 steps with a railing? : Total 6 Click Score: 12    End of Session Equipment Utilized During Treatment: Gait belt Activity Tolerance: Patient tolerated treatment well Patient left: in chair;with call bell/phone within reach;with chair alarm set Nurse Communication: Mobility status PT Visit Diagnosis: Difficulty in walking, not elsewhere classified (R26.2);Adult, failure to thrive (R62.7);Pain    Time: 8756-4332 PT Time Calculation (min) (ACUTE ONLY): 16 min   Charges:   PT Evaluation $PT Eval Low Complexity: 1 Low      PT G Codes:          Philomena Doheny 01/05/2017, 3:27 PM [phone]

## 2017-01-05 NOTE — Progress Notes (Signed)
CRITICAL VALUE ALERT  Critical Value:  Hgb 5.5  Date & Time Notied:  9/28 07:20  Provider Notified: Wyline Copas  Orders Received/Actions taken: 2U PRBC ordered

## 2017-01-05 NOTE — Care Management Note (Signed)
Case Management Note  Patient Details  Name: Patrick [name] MRN: [id number] Date of Birth: 1951-03-25  Subjective/Objective:Patient states he lives home alone,has rw. 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 PT recc SNF/Supv. Patient states he does not want SNF,wants home-but has etoh-on ciwa protocal-will continue to monitor & address @ a later time. He states his ex wife will come to pick him up @ d/c-but will monitor since he may need transp asst-CSW notified.                    Action/Plan:d/c plan home.   Expected Discharge Date:   (UNKNOWN)               Expected Discharge Plan:  Home/Self Care  In-House Referral:  Clinical Social Work  Discharge planning Services  CM Consult  Post Acute Care Choice:  Durable Medical Equipment (has rw) Choice offered to:     DME Arranged:    DME Agency:     HH Arranged:    [place] Agency:     Status of Service:  In process, will continue to follow  If discussed at Long Length of Stay Meetings, dates discussed:    Additional Comments:  Dessa Phi, RN 01/05/2017, 3:50 PM

## 2017-01-05 NOTE — Progress Notes (Signed)
Initial Nutrition Assessment  DOCUMENTATION CODES:   Severe malnutrition in context of social or environmental circumstances  INTERVENTION:   Monitor magnesium, potassium, and phosphorus daily for at least 3 days, MD to replete as needed, as pt is at risk for refeeding syndrome given pt's malnutrition status and continuous poor PO intake.   Continue Ensure Enlive po BID, each supplement provides 350 kcal and 20 grams of protein   Magic cup TID with meals, each supplement provides 290 kcal and 9 grams of protein   Multivitamin with minerals daily    If patient continues with poor PO intake, recommend assessing need for assist   NUTRITION DIAGNOSIS:   Malnutrition (severe) related to social / environmental circumstances, dysphagia (alcohol abuse) as evidenced by severe depletion of muscle mass, severe depletion of body fat, percent weight loss (6% in < 1 month).  GOAL:   Patient will meet greater than or equal to 90% of their needs  MONITOR:   PO intake, Supplement acceptance, Weight trends, I & O's  REASON FOR ASSESSMENT:   Malnutrition Screening Tool    ASSESSMENT:   Pt with PMH of ongoing alcohol abuse, dysphagia, metabolic encephalopathy, and GERD presents with 4 day rectal pain, covered in feces. Pt with recent admission for GI bleed secondary to reflux esophagitis d/t alcohol abuse on 09/12 and left AMA while receiving a blood transfusion for Hemoglobin of 6.8. Pt left AMA yesterday after presenting to ED. Pt is confused.    Discussed pt with RN. RN reports pt has had no PO intake today except for sips of apple juice with medications. Pt reports he is not hungry.   Pt reports he has had recent weight loss, reporting a UBW of 145 lbs. Per chart, pt has loss 6 % body weight in 2 weeks, significant for time frame.   Pt's Hemoglobin dropped from 8.5 to 5.4 this morning. Pt s/p 2 units PRBC.    Labs reviewed; K 5.5, Magnesium 1.6, Albumin 2.0, Hemoglobin  5.4 Medications reviewed; folic acid, Protonix, thiamine  Diet Order:  DIET - DYS 1 Room service appropriate? Yes; Fluid consistency: Nectar Thick  Skin:  Wound (see comment) (stage I pressure injury at buttocks)  Last BM:  01/04/17  Height:   Ht Readings from Last 1 Encounters:  01/05/17 5\' 7"  (1.702 m)    Weight:   Wt Readings from Last 1 Encounters:  01/05/17 124 lb 6.4 oz (56.4 kg)    Ideal Body Weight:  61.4 kg  BMI:  Body mass index is 19.48 kg/m.  Estimated Nutritional Needs:   Kcal:  1700-1900  Protein:  85-95 grams  Fluid:  >/= 1.7 L/d  EDUCATION NEEDS:   Education needs no appropriate at this time  Parks Ranger, MS, RDN, LDN 01/05/2017 2:37 PM

## 2017-01-05 NOTE — Consult Note (Signed)
Consultation  Referring Provider:     Kalman Jewels MD Primary Care Physician:  [name], MD Primary Gastroenterologist:        Silvano Rusk Reason for Consultation:     Anemia, rectal pain         HPI:   Patrick [name] is a 66 y.o. 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 He has complained of rectal pain for the past 4 days or so. Pain in the rectum appears to be present all the time. He states he has pain both in his anus and in the buttocks on the left side. He denies abdominal pain. He denies vomiting recently. He was most recently admitted on 9/12 for an upper GI bleed. Dr. Loletha Carrow performed an EGD showing a 10cm hiatal hernia and severe LA grade D esophagitis. He has had severe esophagitis noted on prior EGDs as well. He states he continues to drink alcohol, at least a few drinks per day, less than he normally consumes.   On arrival his Hgb was 8.5. CT scan obtained showing chronic changes but no focal or acute pathology in the pelvis to account for his pain. He does have excoriations on the buttocks for which wound care was consulted - they recommended Gerhardt's butt cream and rinsing carefully, thought to be associated with incontinence?  Hgb noted to be 5.5 today, we were consulted. The patient states he has not had a bowel movement in a few days, he denies any vomiting since admission. He does have some heartburn from time to time. He denies any abdominal pain.   Past Medical History:  Diagnosis Date  . Colon polyps   . Depression   . Diverticulitis   . Diverticulosis of colon   . GERD (gastroesophageal reflux disease)   . Kidney stone     Past Surgical History:  Procedure Laterality Date  . COLONOSCOPY    . ESOPHAGOGASTRODUODENOSCOPY (EGD)  WITH PROPOFOL N/A 12/21/2016   Procedure: ESOPHAGOGASTRODUODENOSCOPY (EGD) WITH PROPOFOL;  Surgeon: Doran Stabler, MD;  Location: [facility];  Service: Gastroenterology;  Laterality: N/A;  . UPPER GASTROINTESTINAL ENDOSCOPY      Family History  Problem Relation Age of Onset  . Heart disease Father 39       cabg     Social History  Substance Use Topics  . Smoking status: Current Every Day Smoker    Packs/day: 1.00    Types: Cigarettes  . Smokeless tobacco: Never Used  . Alcohol use 8.4 oz/week    14 Glasses of wine per week    Prior to Admission medications   Medication Sig Start Date End Date Taking? Authorizing Provider  albuterol (VENTOLIN HFA) 108 (90 BASE) MCG/ACT inhaler Inhale 2 puffs into the lungs every 4 (four) hours as needed for wheezing or shortness of breath. 08/17/14  Yes Burchette, Alinda Sierras, MD  amLODipine (NORVASC) 5 MG tablet Take 5 mg by mouth daily.   Yes [provider]  guaifenesin (ROBITUSSIN) 100 MG/5ML syrup Take 200-400 mg by mouth 3 (three) times daily as needed for cough or congestion.   Yes [provider]  losartan (COZAAR) 100 MG tablet Take 100 mg by mouth daily.   Yes [provider]  metoprolol succinate (TOPROL-XL) 50 MG 24 hr tablet Take 50  mg by mouth daily. Take with or immediately following a meal.   Yes [provider]  milk thistle 175 MG tablet Take 175 mg by mouth daily.   Yes [provider]  Multiple Vitamin (MULTIVITAMIN WITH MINERALS) TABS tablet Take 1 tablet by mouth daily.   Yes [provider]  Omeprazole-Sodium Bicarbonate (ZEGERID) 20-1100 MG CAPS capsule Take 1 capsule by mouth daily before breakfast.   Yes [provider]  OVER THE COUNTER MEDICATION Take 1 capsule by mouth daily. "Liver Health complex" capsules   Yes [provider]  pantoprazole (PROTONIX) 40 MG tablet Take 1 tablet (40 mg total) by mouth 2 (two) times daily. 12/01/16  Yes Short, Noah Delaine,  MD  polyethylene glycol powder (GLYCOLAX/MIRALAX) powder Take 17 grams in 8 oz of water or juice daily. Patient taking differently: Take 1 Container by mouth daily as needed for mild constipation or moderate constipation. Take 17 grams in 8 oz of water or juice 02/23/15  Yes Zehr, Jessica D, PA-C  potassium chloride SA (K-DUR,KLOR-CON) 20 MEQ tablet Take 20 mEq by mouth 2 (two) times daily.   Yes [provider]  feeding supplement, ENSURE ENLIVE, (ENSURE ENLIVE) LIQD Take 237 mLs by mouth 2 (two) times daily between meals. Patient not taking: Reported on 01/04/2017 12/01/16   Janece Canterbury, MD  ferrous sulfate 325 (65 FE) MG tablet Take 1 tablet (325 mg total) by mouth 3 (three) times daily with meals. 12/01/16   Janece Canterbury, MD  folic acid (FOLVITE) 1 MG tablet Take 1 tablet (1 mg total) by mouth daily. 12/02/16   Janece Canterbury, MD  Hydrocortisone (GERHARDT'S BUTT CREAM) CREA Apply 1 application topically 2 (two) times daily. 12/01/16   Janece Canterbury, MD  Maltodextrin-Xanthan Gum ([place]) POWD Add to liquids to make nectar thick. 12/01/16   Janece Canterbury, MD  metoprolol succinate (TOPROL-XL) 25 MG 24 hr tablet Take 0.5 tablets (12.5 mg total) by mouth daily. 12/02/16   Janece Canterbury, MD  nicotine (NICODERM CQ - DOSED IN MG/24 HOURS) 21 mg/24hr patch Place 1 patch (21 mg total) onto the skin daily. 12/02/16   Janece Canterbury, MD  thiamine 100 MG tablet Take 1 tablet (100 mg total) by mouth daily. 12/02/16   Janece Canterbury, MD    Current Facility-Administered Medications  Medication Dose Route Frequency Provider Last Rate Last Dose  . 0.9 %  sodium chloride infusion   Intravenous Continuous Donne Hazel, MD      . acetaminophen (TYLENOL) tablet 650 mg  650 mg Oral Q6H PRN Reyne Dumas, MD   650 mg at 01/04/17 4098   Or  . acetaminophen (TYLENOL) suppository 650 mg  650 mg Rectal Q6H PRN Reyne Dumas, MD      . dibucaine (NUPERCAINAL) 1 % rectal  ointment   Rectal PRN Donne Hazel, MD      . feeding supplement (ENSURE ENLIVE) (ENSURE ENLIVE) liquid 237 mL  237 mL Oral BID BM Reyne Dumas, MD   237 mL at 01/05/17 1108  . folic acid (FOLVITE) tablet 1 mg  1 mg Oral Daily Reyne Dumas, MD   1 mg at 01/05/17 1105  . Gerhardt's butt cream 1 application  1 application Topical BID Reyne Dumas, MD   1 application at 11/91/47 2313  . hydrocortisone (ANUSOL-HC) 2.5 % rectal cream   Rectal QID PRN Donne Hazel, MD      . hydrocortisone (ANUSOL-HC) suppository 25 mg  25 mg Rectal BID Reyne Dumas,  MD   25 mg at 01/05/17 1229  . levalbuterol (XOPENEX) nebulizer solution 0.63 mg  0.63 mg Nebulization Q6H PRN Reyne Dumas, MD      . LORazepam (ATIVAN) injection 0-4 mg  0-4 mg Intravenous Q6H Upstill, Shari, PA-C       Or  . LORazepam (ATIVAN) tablet 0-4 mg  0-4 mg Oral Q6H Upstill, Shari, PA-C   1 mg at 01/05/17 1106  . [START ON 01/06/2017] LORazepam (ATIVAN) injection 0-4 mg  0-4 mg Intravenous Q12H Upstill, Shari, PA-C       Or  . Derrill Memo ON 01/06/2017] LORazepam (ATIVAN) tablet 0-4 mg  0-4 mg Oral Q12H Upstill, Shari, PA-C      . magnesium sulfate IVPB 4 g 100 mL  4 g Intravenous Once Donne Hazel, MD      . metoprolol tartrate (LOPRESSOR) tablet 12.5 mg  12.5 mg Oral BID Donne Hazel, MD   Stopped at 01/05/17 1116  . multivitamin with minerals tablet 1 tablet  1 tablet Oral Daily Donne Hazel, MD      . nicotine (NICODERM CQ - dosed in mg/24 hours) patch 21 mg  21 mg Transdermal Daily Reyne Dumas, MD   21 mg at 01/05/17 1106  . oxyCODONE (Oxy IR/ROXICODONE) immediate release tablet 5 mg  5 mg Oral Q4H PRN Reyne Dumas, MD   5 mg at 01/05/17 1228  . pantoprazole (PROTONIX) injection 40 mg  40 mg Intravenous Q12H Donne Hazel, MD      . polyethylene glycol (MIRALAX / GLYCOLAX) packet 17 g  17 g Oral Daily PRN Reyne Dumas, MD      . RESOURCE THICKENUP CLEAR   Oral PRN Reyne Dumas, MD      . thiamine (VITAMIN B-1) tablet  100 mg  100 mg Oral Daily Upstill, Shari, PA-C   100 mg at 01/05/17 1105   Or  . thiamine (B-1) injection 100 mg  100 mg Intravenous Daily Upstill, Shari, PA-C        Allergies as of 01/04/2017  . (No Known Allergies)     Review of Systems:    As per HPI, otherwise negative    Physical Exam:  Vital signs in last 24 hours: Temp:  [98 F (36.7 C)-99.4 F (37.4 C)] 98.8 F (37.1 C) (09/28 1612) Pulse Rate:  [10-106] 106 (09/28 1612) Resp:  [18-21] 20 (09/28 1612) BP: (94-103)/(59-69) 102/69 (09/28 1612) SpO2:  [97 %-100 %] 98 % (09/28 1612) Weight:  [124 lb 6.4 oz (56.4 kg)] 124 lb 6.4 oz (56.4 kg) (09/28 0422) Last BM Date: 01/04/17 General:   male in NAD Head:  Normocephalic and atraumatic. Eyes:   No icterus.   Conjunctiva pale Ears:  Normal auditory acuity. Neck:  Supple Lungs:  Respirations even and unlabored. Lungs clear to auscultation bilaterally.    Heart:  Regular rate and rhythm; no MRG Abdomen:  Soft, nondistended, nontender.  No appreciable masses or hepatomegaly.  Rectal:  No fissure appreciated, no mass lesions. No purulence around the anal canal. Erythematous excoriated skin on the left buttock - painful to palpation Extremities:  Without edema. Neurologic:  Alert and  oriented x4; Psych:  Alert and cooperative. Normal affect.  LAB RESULTS:  Recent Labs  01/04/17 0250 01/05/17 0448  WBC 13.2* 5.7  HGB 8.5* 5.4*  HCT 24.1* 15.8*  PLT 285 246   BMET  Recent Labs  01/04/17 0250 01/05/17 0448  NA 132* 137  K 2.5* 5.5*  CL 94*  110  CO2 23 22  GLUCOSE 94 115*  BUN 15 8  CREATININE 0.75 0.62  CALCIUM 8.5* 8.3*   LFT  Recent Labs  01/05/17 0448  PROT 4.9*  ALBUMIN 2.0*  AST 18  ALT 8*  ALKPHOS 51  BILITOT 0.6   PT/INR  Recent Labs  01/04/17 0250  LABPROT 14.0  INR 1.09    STUDIES: Ct Abdomen Pelvis W Contrast  Result Date: 01/04/2017 CLINICAL DATA:  Rectal pain and GI bleeding EXAM: CT ABDOMEN AND PELVIS WITH CONTRAST  TECHNIQUE: Multidetector CT imaging of the abdomen and pelvis was performed using the standard protocol following bolus administration of intravenous contrast. CONTRAST:  100 mL Isovue-300 COMPARISON:  12/20/2016, 04/16/2013 FINDINGS: Lower chest: No acute abnormality. Moderate-sized hiatal hernia is noted. Hepatobiliary: Hypodensity is again noted in the right lobe of the liver likely representing a cyst slightly better visualized than that seen on the prior exam. This is stable from a prior ultrasound examination from 2015. Mild fatty infiltration of the liver is seen. The gallbladder again demonstrates dependent gallstones. Pancreas: Unremarkable. No pancreatic ductal dilatation or surrounding inflammatory changes. Spleen: Normal in size without focal abnormality. Adrenals/Urinary Tract: The adrenal glands are stable in appearance. The kidneys demonstrate a normal enhancement pattern. Renal cysts are noted bilaterally stable from the previous exam. The bladder is decompressed. Stomach/Bowel: Diverticular change of the colon is noted without evidence of diverticulitis. No definitive colonic mass is seen. The appendix is within normal limits. No obstructive or inflammatory changes of the bowel are seen. Previously seen floor thickening is not well appreciated on the current exam and was likely related to peristalsis. Vascular/Lymphatic: Aortic atherosclerosis. No enlarged abdominal or pelvic lymph nodes. Reproductive: Prostate is unremarkable. Other: No abdominal wall hernia or abnormality. No abdominopelvic ascites. Musculoskeletal: Degenerative changes of lumbar spine are noted. IMPRESSION: Chronic changes similar to that seen on the recent exam. No new focal abnormality is noted. Electronically Signed   By: Inez Catalina M.D.   On: 01/04/2017 08:01     PREVIOUS ENDOSCOPIES:               Impression / Plan:  66 y/o male alcoholic with history as above, presenting after having been found down, complaining  of buttock / rectal pain, noted to have worsening anemia from Hgb of 8s to 5s, without any reported overt blood loss. His prior bleeding symptoms / anemia is very likely due to severe esophagitis, as noted on his recent endoscopy. He states he is not compliant with PPI, and suspect he has worsening of his esophagitis. Interestingly, despite significant drop in Hgb, his BUN has downtrended / normalized and he has not had any overt bleeding. I would await his response to transfusion at this time, he does not appear to be actively bleeding, and continue IV protonix BID. No plans for endoscopy at this time unless he is overtly bleeding and not responding to therapy.   In regards to his rectal pain - he does have excoriated erythematous skin on the buttocks which is painful to palpation and blanches to palpation, it's possible he has a cellulitis developing. I would recommend empiric treatment for cellulitis, if he doesn't get better would consider an MRI pelvis if he can lie still enough for it, ensure no perirectal abscess. I don't see an obvious fissure on exam.  We will follow with you, call with questions. Dr. Loletha Carrow to assume GI service tomorrow.  Branch Cellar, MD [facility] Gastroenterology Pager [phone]

## 2017-01-06 DIAGNOSIS — L89301 Pressure ulcer of unspecified buttock, stage 1: Secondary | ICD-10-CM

## 2017-01-06 DIAGNOSIS — F10231 Alcohol dependence with withdrawal delirium: Secondary | ICD-10-CM

## 2017-01-06 LAB — TYPE AND SCREEN
ABO/RH(D): A POS
Antibody Screen: NEGATIVE
UNIT DIVISION: 0
Unit division: 0

## 2017-01-06 LAB — BPAM RBC
BLOOD PRODUCT EXPIRATION DATE: 201810112359
BLOOD PRODUCT EXPIRATION DATE: 201810112359
ISSUE DATE / TIME: 201809281219
ISSUE DATE / TIME: 201809281542
UNIT TYPE AND RH: 6200
Unit Type and Rh: 6200

## 2017-01-06 LAB — CBC
HCT: 25.4 % — ABNORMAL LOW (ref 39.0–52.0)
Hemoglobin: 8.7 g/dL — ABNORMAL LOW (ref 13.0–17.0)
MCH: 31.3 pg (ref 26.0–34.0)
MCHC: 34.3 g/dL (ref 30.0–36.0)
MCV: 91.4 fL (ref 78.0–100.0)
PLATELETS: 264 10*3/uL (ref 150–400)
RBC: 2.78 MIL/uL — ABNORMAL LOW (ref 4.22–5.81)
RDW: 20.3 % — AB (ref 11.5–15.5)
WBC: 6.7 10*3/uL (ref 4.0–10.5)

## 2017-01-06 LAB — BASIC METABOLIC PANEL
ANION GAP: 5 (ref 5–15)
BUN: 5 mg/dL — ABNORMAL LOW (ref 6–20)
CALCIUM: 8.2 mg/dL — AB (ref 8.9–10.3)
CO2: 22 mmol/L (ref 22–32)
Chloride: 105 mmol/L (ref 101–111)
Creatinine, Ser: 0.67 mg/dL (ref 0.61–1.24)
GLUCOSE: 103 mg/dL — AB (ref 65–99)
Potassium: 4.4 mmol/L (ref 3.5–5.1)
Sodium: 132 mmol/L — ABNORMAL LOW (ref 135–145)

## 2017-01-06 LAB — MAGNESIUM: Magnesium: 2.1 mg/dL (ref 1.7–2.4)

## 2017-01-06 MED ORDER — PANTOPRAZOLE SODIUM 40 MG PO TBEC
40.0000 mg | DELAYED_RELEASE_TABLET | Freq: Two times a day (BID) | ORAL | Status: DC
  Administered 2017-01-06 – 2017-01-08 (×5): 40 mg via ORAL
  Filled 2017-01-06 (×5): qty 1

## 2017-01-06 NOTE — Evaluation (Signed)
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 The patient was found lying on the floor having vomited dark black material. Admitted for upper GI bleed on 9/12. GI bleeding secondary to grade D reflux esophagitis, due to alcohol abuse, seen on EGD on 9/13. Hemoglobin at that time was 6.8. Patient left AGAINST MEDICAL ADVICE , on 9/15 while he was in the process of receiving a blood transfusion.. Apparently patient was also in the ED  9/26 for left AGAINST MEDICAL ADVICE. Brought in again  9/27 from home due to rectal pain 4 days. Dx of esophagitis, anemia (Hgb 5.4), rectal pain.    Clinical Impression   Pt lives alone, presumably independently. He does not have family support, reports his sons are in drug and alcohol rehab in [place]. Pt in bed laying crossways upon OTs arrival with legs over rail. Presents with impaired cognition, generalized weakness and impaired balance. He is oriented to self only and demonstrates poor insight into his deficits and ability to manage at home. Will follow acutely. Recommending SNF upon d/c.     Follow Up Recommendations  SNF;Supervision/Assistance - 24 hour    Equipment Recommendations       Recommendations for Other Services       Precautions / Restrictions Precautions Precautions: Fall Restrictions Weight Bearing Restrictions: No      Mobility Bed Mobility Overal bed mobility: Needs Assistance Bed Mobility: Supine to Sit     Supine to sit: Min assist     General bed mobility comments: step by step cues, assist to raise trunk pulling up on OT's hand  Transfers Overall  transfer level: Needs assistance Equipment used: Rolling walker (2 wheeled) Transfers: Sit to/from [facility] Sit to Stand: Min assist Stand pivot transfers: Min assist       General transfer comment: min assist to rise and steadying assist as he pivoted to chair with RW    Balance Overall balance assessment: Needs assistance   Sitting balance-Leahy Scale: Fair       Standing balance-Leahy Scale: Poor Standing balance comment: requires UE support                           ADL either performed or assessed with clinical judgement   ADL Overall ADL's : Needs assistance/impaired Eating/Feeding: Set up;Sitting Eating/Feeding Details (indicate cue type and reason): assist to open juice and pudding Grooming: Wash/dry face;Sitting;Supervision/safety Grooming Details (indicate cue type and reason): refused oral care, "I'll do that later." Upper Body Bathing: Moderate assistance;Sitting   Lower Body Bathing: Maximal assistance;Sit to/from stand   Upper Body Dressing : Minimal assistance;Sitting   Lower Body Dressing: Maximal assistance;Sit to/from stand   Toilet Transfer: Minimal assistance;Stand-pivot;BSC;RW   Toileting- Clothing Manipulation and Hygiene: Moderate assistance;Sit to/from stand         General ADL Comments: pt with minimal participation in ADL.     Vision Patient Visual Report: No change from baseline       Perception     Praxis      Pertinent Vitals/Pain Pain Assessment: Faces Faces Pain Scale: Hurts even more Pain Location: rectal area Pain Descriptors / Indicators: Grimacing;Guarding;Sore Pain Intervention(s): Monitored during session;Repositioned  Hand Dominance Right   Extremity/Trunk Assessment Upper Extremity Assessment Upper Extremity Assessment: Overall WFL for tasks assessed   Lower Extremity Assessment Lower Extremity Assessment: Defer to PT evaluation   Cervical / Trunk Assessment Cervical / Trunk  Assessment: Kyphotic   Communication Communication Communication: No difficulties   Cognition Arousal/Alertness: Awake/alert Behavior During Therapy: Flat affect Overall Cognitive Status: Impaired/Different from baseline Area of Impairment: Orientation;Memory;Safety/judgement;Following commands;Problem solving                 Orientation Level: Disoriented to;Time;Situation   Memory: Decreased short-term memory Following Commands: Follows one step commands with increased time (and multimodal cues) Safety/Judgement: Decreased awareness of safety;Decreased awareness of deficits   Problem Solving: Slow processing;Decreased initiation;Difficulty sequencing;Requires verbal cues;Requires tactile cues General Comments: pt stating he wants to go home, no insight into safety or limitations   General Comments       Exercises     Shoulder Instructions      Home Living Family/patient expects to be discharged to:: Private residence Living Arrangements: Alone   Type of Home: [place]: Two level;1/2 bath on main level;Bed/bath upstairs               Home Equipment: Walker - 2 wheels          Prior Functioning/Environment Level of Independence: Independent with assistive device(s)        Comments: reports using RW sometimes, states he hasn't gone to second floor in home for a couple weeks as he cannot manage the stairs        OT Problem List: Decreased strength;Decreased activity tolerance;Impaired balance (sitting and/or standing);Decreased cognition;Decreased safety awareness;Decreased knowledge of use of DME or AE;Pain      OT Treatment/Interventions: Self-care/ADL training;DME and/or AE instruction;Therapeutic activities;Cognitive remediation/compensation;Patient/family education;Balance training    OT Goals(Current goals can be found in the care plan section) Acute Rehab OT Goals Patient Stated Goal: to go home and take care of his bills OT  Goal Formulation: With patient Time For Goal Achievement: 01/20/17 Potential to Achieve Goals: Good ADL Goals Pt Will Perform Grooming: with supervision;standing Pt Will Perform Upper Body Dressing: with supervision;sitting Pt Will Perform Lower Body Dressing: with supervision;sit to/from stand Pt Will Transfer to Toilet: with supervision;ambulating Pt Will Perform Toileting - Clothing Manipulation and hygiene: with supervision;sit to/from stand Additional ADL Goal #1: Pt will follow 2 step commands.  OT Frequency: Min 2X/week   Barriers to D/C: Decreased caregiver support          Co-evaluation              AM-PAC PT "6 Clicks" Daily Activity     Outcome Measure Help from another person eating meals?: A Little Help from another person taking care of personal grooming?: A Little Help from another person toileting, which includes using toliet, bedpan, or urinal?: A Lot Help from another person bathing (including washing, rinsing, drying)?: A Lot Help from another person to put on and taking off regular upper body clothing?: A Little Help from another person to put on and taking off regular lower body clothing?: A Lot 6 Click Score: 15   End of Session Equipment Utilized During Treatment: Gait belt;Rolling walker Nurse Communication: Mobility status  Activity Tolerance: Patient tolerated treatment well Patient left: in chair;with call bell/phone within reach;with chair alarm set  OT Visit Diagnosis: Unsteadiness on feet (R26.81);Other abnormalities of gait and mobility (R26.89);Muscle weakness (generalized) (M62.81);Pain;Other symptoms and signs involving cognitive function  Time: 2633-3545 OT Time Calculation (min): 23 min Charges:  OT General Charges $OT Visit: 1 Visit OT Evaluation $OT Eval Moderate Complexity: 1 Mod OT Treatments $Self Care/Home Management : 8-22 mins G-Codes:     01/25/17 Patrick [name], OTR/L Pager: [phone]  Patrick [name], Patrick [name] 01-25-17, 2:38 PM

## 2017-01-06 NOTE — Progress Notes (Signed)
Patient ID: Patrick [name], male   DOB: 1950/11/11, 66 y.o.   MRN: [id number]    Progress Note   Subjective   Patient somewhat confused, was urinating on himself. No complaints of abdominal pain. When asked he still complains of rectal pain. No nausea vomiting or active bleeding from the rectum since admission. Hemoglobin 8.7 this a.m, post 2 units yesterday    Objective   Vital signs in last 24 hours: Temp:  [97.8 F (36.6 C)-98.9 F (37.2 C)] 97.8 F (36.6 C) (09/29 0538) Pulse Rate:  [96-108] 108 (09/29 0538) Resp:  [18-21] 20 (09/29 0538) BP: (95-115)/(59-79) 115/79 (09/29 0538) SpO2:  [94 %-100 %] 94 % (09/29 0538) Last BM Date: 01/04/17 General:    white male  in NAD somewhat confused Heart:  Regular rate and rhythm; no murmurs Lungs: Respirations even and unlabored, lungs CTA bilaterally Abdomen:  Soft, nontender and nondistended. Normal bowel sounds.-Rectal exam per Dr. Loletha Carrow  this a.m. Negative-left buttocks appears somewhat less inflamed Extremities:  Without edema. Neurologic:  Alert and oriented,  grossly normal neurologically. Psych:  Cooperative.   Intake/Output from previous day: 09/28 0701 - 09/29 0700 In: 1745 [P.O.:120; I.V.:995; Blood:630] Out: 300 [Urine:300] Intake/Output this shift: No intake/output data recorded.  Lab Results:  Recent Labs  01/04/17 0250 01/05/17 0448 01/05/17 2124 01/06/17 0454  WBC 13.2* 5.7  --  6.7  HGB 8.5* 5.4* 8.7* 8.7*  HCT 24.1* 15.8* 25.7* 25.4*  PLT 285 246  --  264   BMET  Recent Labs  01/04/17 0250 01/05/17 0448 01/06/17 0454  NA 132* 137 132*  K 2.5* 5.5* 4.4  CL 94* 110 105  CO2 23 22 22   GLUCOSE 94 115* 103*  BUN 15 8 5*  CREATININE 0.75 0.62 0.67  CALCIUM 8.5* 8.3* 8.2*   LFT  Recent Labs  01/05/17 0448  PROT 4.9*  ALBUMIN 2.0*  AST 18  ALT 8*  ALKPHOS 51  BILITOT 0.6   PT/INR  Recent Labs  01/04/17 0250  LABPROT 14.0  INR 1.09    Studies/Results: No results found.     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 Patient had been complaining of rectal pain for 3 or 4 days. Profoundly anemic with hemoglobin 5.5 01/05/2017.  Patient has responded to transfusions appropriately and has not had evidence of active GI bleeding since admission. When he left the hospital AMA before receiving transfusions within the past couple of weeks his hemoglobin was 6.8. He has recently documented severe grade D esophagitis and 10 cm hiatal hernia on EGD 12/20/2016 Etiology of his complaints of rectal pain is not clear digital exam negative. Last colonoscopy 2012 showed mild diverticulosis, patient does have history of adenomatous colon polyps and was recommended for 5 year interval follow-up  Believe his anemia may be multifactorial at this point with previous recent GI bleed, bone marrow suppression secondary to EtOH  #2 left buttocks skin breakdown secondary to stool-question early cellulitis-looks better today  Plan; continue twice a day PPI switch to by mouth We will discuss possible colonoscopy prior to discharge   Discharge Planning   Contact  Amy [place], P.A.-C               [phone]      Principal Problem:   Esophagitis  Active Problems:   Alcohol withdrawal ([place])     LOS: 2 days   Amy Esterwood  01/06/2017, 9:59 AM   I have discussed the case with the PA, and that is the plan I formulated. I personally interviewed and examined the patient.  There is currently no evidence of overt GI bleeding to account for this patient's recent drop in hemoglobin other than the coffee grounds emesis which she had the last admission as well. I suspect there are additional causes for his anemia on GI blood loss, such as marrow suppression from alcohol abuse  or alcohol-related hemolytic anemia. I recommend additional workup for this.  I also did a rectal exam today, I did not discover him to be tender nor did I find a fissure or other palpable abnormality. He is not currently in optimal condition for a colonoscopy, and I do not see an urgent need for that.  This patient's alcohol-related confusion, generalized deconditioning and social issues appear to beat the forefront.  We will sign off, and you're welcome to reconsult [place] as the need arises.    Nelida Meuse III Pager [phone]  Mon-Fri 8a-5p [phone] after 5p, weekends, holidays

## 2017-01-06 NOTE — Progress Notes (Signed)
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 The patient was found lying on the floor having vomited dark black material. Admitted for upper GI bleed on 9/12. GI bleeding secondary to grade D reflux esophagitis, due to alcohol abuse, seen on EGD on 9/13. Hemoglobin at that time was 6.8. Patient left AGAINST MEDICAL ADVICE , on 9/15 while he was in the process of receiving a blood transfusion.. Apparently patient was also in the ED yesterday 9/26 for left AGAINST MEDICAL ADVICE. Brought in again today 9/27 from home due to rectal pain 4 days. Patient apparently was covered in feces from waist down per EMS. Patient is confused but denied any recurrence of melena or hematochezia. Currently his main complaint is pain in the rectal area  Assessment & Plan:   Principal Problem:   Esophagitis Active Problems:   Alcohol withdrawal ([place])  Principal Problem: Reflux esophagitis secondary to alcohol abuse seen on EGD on 9/30 -we'll continue patient on twice a day PPI -Patient with recent endoscopy demonstrating reflux esophagitis which is likely contributing to recent upper GI bleed -Presenting hemoglobin 8.5, down to 5.5 this morning. -Transfused 2 units of PRBCs -Given concerns of continued acute blood loss anemia, had consulted gastroenterology. -Recommendations to continue monitor CBC. Possibility for colonoscopy prior to hospital discharge. -We'll recheck CBC in the morning -Discontinue Lovenox   Rectal pain -CT pelvis unremarkable for acute process -Patient given trial of topical lidocaine with Anusol. -Per GI, on rectal exam, patient noted to be  nontender with no fissure or other palpable abnormality noted.  Hypokalemia -Continue to replace as needed. Recheck basic metabolic panel the morning  Acute Blood loss anemia -patient down 3 g of hemoglobin this hospital admission -Patient was transfused 2 units of PRBCs with appropriate correction. Hemoglobin has since remained stable. -Repeat CBC in the morning  Hypomagnesemia,  -Corrected.  Alcohol abuse, chart indicates diagnosis of Korsakoff syndrome -Minimal use of Ativan. No evidence of acute withdrawal at this point. Continue CIWA as tolerated.  Cigarette smoker -Nicotine patch  Dysphagia. Continue dysphagia 1 diet with nectar thick liquids as previously recommended  DVT prophylaxis: SCD's Code Status: Full Family Communication: Pt in room, family not at bedside Disposition Plan: Uncertain at this time  Consultants:   GI  Procedures:     Antimicrobials: Anti-infectives    None      Subjective: Continuing to complain of rectal pain   Objective: Vitals:   01/05/17 2135 01/06/17 0538 01/06/17 1048 01/06/17 1400  BP: 106/68 115/79  (!) 110/58  Pulse: 99 (!) 108 100 97  Resp: 20 20  18   Temp: 98.7 F (37.1 C) 97.8 F (36.6 C)  98.2 F (36.8 C)  TempSrc: Oral Oral  Oral  SpO2: 100% 94%  100%  Weight:      Height:        Intake/Output Summary (Last 24 hours) at 01/06/17 1737 Last data filed at 01/06/17 1516  Gross per 24 hour  Intake             2360 ml  Output              600 ml  Net  1760 ml   Filed Weights   01/05/17 0422  Weight: 56.4 kg (124 lb 6.4 oz)    Examination: General exam: Awake, laying in bed, in nad  Respiratory system: Normal respiratory effort, no wheezing Cardiovascular system: regular rate, s1, s2 Gastrointestinal system: Soft, nondistended, positive BS Central nervous system: CN2-12 grossly intact, strength intact Extremities: Perfused, no clubbing Skin: Normal skin turgor, no notable skin lesions  seen Psychiatry: Mood normal // no visual hallucinations   Data Reviewed: I have personally reviewed following labs and imaging studies  CBC:  Recent Labs Lab 01/04/17 0250 01/05/17 0448 01/05/17 2124 01/06/17 0454  WBC 13.2* 5.7  --  6.7  NEUTROABS 10.0*  --   --   --   HGB 8.5* 5.4* 8.7* 8.7*  HCT 24.1* 15.8* 25.7* 25.4*  MCV 91.3 93.5  --  91.4  PLT 285 246  --  478   Basic Metabolic Panel:  Recent Labs Lab 01/04/17 0250 01/05/17 0448 01/06/17 0454  NA 132* 137 132*  K 2.5* 5.5* 4.4  CL 94* 110 105  CO2 23 22 22   GLUCOSE 94 115* 103*  BUN 15 8 5*  CREATININE 0.75 0.62 0.67  CALCIUM 8.5* 8.3* 8.2*  MG 1.3* 1.6* 2.1   GFR: Estimated Creatinine Clearance: 74.4 mL/min (by C-G formula based on SCr of 0.67 mg/dL). Liver Function Tests:  Recent Labs Lab 01/04/17 0250 01/05/17 0448  AST 25 18  ALT 12* 8*  ALKPHOS 72 51  BILITOT 0.9 0.6  PROT 6.1* 4.9*  ALBUMIN 2.7* 2.0*   No results for input(s): LIPASE, AMYLASE in the last 168 hours. No results for input(s): AMMONIA in the last 168 hours. Coagulation Profile:  Recent Labs Lab 01/04/17 0250  INR 1.09   Cardiac Enzymes: No results for input(s): CKTOTAL, CKMB, CKMBINDEX, TROPONINI in the last 168 hours. BNP (last 3 results) No results for input(s): PROBNP in the last 8760 hours. HbA1C: No results for input(s): HGBA1C in the last 72 hours. CBG: No results for input(s): GLUCAP in the last 168 hours. Lipid Profile: No results for input(s): CHOL, HDL, LDLCALC, TRIG, CHOLHDL, LDLDIRECT in the last 72 hours. Thyroid Function Tests: No results for input(s): TSH, T4TOTAL, FREET4, T3FREE, THYROIDAB in the last 72 hours. Anemia Panel: No results for input(s): VITAMINB12, FOLATE, FERRITIN, TIBC, IRON, RETICCTPCT in the last 72 hours. Sepsis Labs:  Recent Labs Lab 01/04/17 0512 01/04/17 1608  LATICACIDVEN 0.9 1.3    No results found for this or any previous visit (from the past 240 hour(s)).    Radiology Studies: No results found.  Scheduled Meds: . feeding supplement (ENSURE ENLIVE)  237 mL Oral BID BM  . folic acid  1 mg Oral Daily  . Gerhardt's butt cream  1 application Topical BID  . hydrocortisone  25 mg Rectal BID  . LORazepam  0-4 mg Intravenous Q12H   Or  . LORazepam  0-4 mg Oral Q12H  . metoprolol tartrate  12.5 mg Oral BID  . multivitamin with minerals  1 tablet Oral Daily  . nicotine  21 mg Transdermal Daily  . pantoprazole  40 mg Oral BID AC  . thiamine  100 mg Oral Daily   Or  . thiamine  100 mg Intravenous Daily   Continuous Infusions: . sodium chloride 75 mL/hr at 01/05/17 1904     LOS: 2 days   CHIU, Orpah Melter, MD [facility] Pager [phone]  If 7PM-7AM, please contact night-coverage [email or web address] Password [facility] 01/06/2017, 5:37 PM

## 2017-01-07 LAB — COMPREHENSIVE METABOLIC PANEL
ALBUMIN: 1.9 g/dL — AB (ref 3.5–5.0)
ALT: 8 U/L — ABNORMAL LOW (ref 17–63)
ANION GAP: 6 (ref 5–15)
AST: 15 U/L (ref 15–41)
Alkaline Phosphatase: 45 U/L (ref 38–126)
BILIRUBIN TOTAL: 0.4 mg/dL (ref 0.3–1.2)
BUN: 8 mg/dL (ref 6–20)
CALCIUM: 8 mg/dL — AB (ref 8.9–10.3)
CO2: 21 mmol/L — AB (ref 22–32)
CREATININE: 0.59 mg/dL — AB (ref 0.61–1.24)
Chloride: 105 mmol/L (ref 101–111)
GFR calc Af Amer: >60 mL/min (ref >60)
GFR calc non Af Amer: >60 mL/min (ref >60)
GLUCOSE: 97 mg/dL (ref 65–99)
POTASSIUM: 4.3 mmol/L (ref 3.5–5.1)
SODIUM: 132 mmol/L — AB (ref 135–145)
TOTAL PROTEIN: 4.5 g/dL — AB (ref 6.5–8.1)

## 2017-01-07 LAB — CBC
HCT: 24.3 % — ABNORMAL LOW (ref 39.0–52.0)
HEMOGLOBIN: 8.1 g/dL — AB (ref 13.0–17.0)
MCH: 30.9 pg (ref 26.0–34.0)
MCHC: 33.3 g/dL (ref 30.0–36.0)
MCV: 92.7 fL (ref 78.0–100.0)
PLATELETS: 273 10*3/uL (ref 150–400)
RBC: 2.62 MIL/uL — ABNORMAL LOW (ref 4.22–5.81)
RDW: 19.6 % — ABNORMAL HIGH (ref 11.5–15.5)
WBC: 9 10*3/uL (ref 4.0–10.5)

## 2017-01-07 NOTE — Progress Notes (Signed)
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 The patient was found lying on the floor having vomited dark black material. Admitted for upper GI bleed on 9/12. GI bleeding secondary to grade D reflux esophagitis, due to alcohol abuse, seen on EGD on 9/13. Hemoglobin at that time was 6.8. Patient left AGAINST MEDICAL ADVICE , on 9/15 while he was in the process of receiving a blood transfusion.. Apparently patient was also in the ED yesterday 9/26 for left AGAINST MEDICAL ADVICE. Brought in again today 9/27 from home due to rectal pain 4 days. Patient apparently was covered in feces from waist down per EMS. Patient is confused but denied any recurrence of melena or hematochezia. Currently his main complaint is pain in the rectal area  Assessment & Plan:   Principal Problem:   Esophagitis Active Problems:   Alcohol withdrawal ([place])  Principal Problem: Reflux esophagitis secondary to alcohol abuse seen on EGD on 9/30 -we'll continue patient on twice a day PPI -Patient with recent endoscopy demonstrating reflux esophagitis which is likely contributing to recent upper GI bleed -Presenting hemoglobin 8.5, down to 5.5 this morning. -Transfused 2 units of PRBCs -Given concerns of continued acute blood loss anemia, had consulted gastroenterology. -GI has since signed off with recommendations to further w/u alcoholic anemia -RN has reported black stools this AM with mild drop in hgb -We'll recheck CBC in the morning -If hgb continues to drop, then would consider re-addressing with GI  Rectal pain -CT pelvis unremarkable for acute process -Patient given  trial of topical lidocaine with Anusol. -Per GI, on rectal exam, patient noted to be nontender with no fissure or other palpable abnormality noted. -Stable  Hypokalemia -Continue to replace as needed. -Repeat bmet in AM  Acute Blood loss anemia -patient down 3 g of hemoglobin this hospital admission -Patient was transfused 2 units of PRBCs with appropriate correction. Hemoglobin has since remained stable. -Hgb slightly lower this AM -Repeat CBC in AM  Hypomagnesemia,  -Corrected. -Repeat Mg in AM  Alcohol abuse, chart indicates diagnosis of Korsakoff syndrome -Minimal use of Ativan. No evidence of acute withdrawal at this point. Continue CIWA as tolerated. -no evidence of withdrawals   Cigarette smoker -Nicotine patch  Dysphagia. Continue dysphagia 1 diet with nectar thick liquids as previously recommended -Stable at present  DVT prophylaxis: SCD's Code Status: Full Family Communication: Pt in room, family not at bedside Disposition Plan: Uncertain at this time  Consultants:   GI  Procedures:     Antimicrobials: Anti-infectives    None      Subjective: Patient still reporting rectal pains  Objective: Vitals:   01/06/17 2100 01/07/17 0533 01/07/17 0926 01/07/17 1436  BP: 108/73 112/82  123/84  Pulse: (!) 108  (!) 102 97  Resp: 18 18  18   Temp: 99.2 F (37.3 C) 98.5 F (36.9 C)  98.7 F (37.1 C)  TempSrc: Oral Oral  Oral  SpO2: 100% 100%  100%  Weight:      Height:        Intake/Output Summary (Last 24 hours) at 01/07/17 1711 Last data filed at 01/07/17 1600  Gross per 24 hour  Intake  1975 ml  Output              500 ml  Net             1475 ml   Filed Weights   01/05/17 0422  Weight: 56.4 kg (124 lb 6.4 oz)    Examination: General exam: Conversant, in no acute distress Respiratory system: normal chest rise, clear, no audible wheezing Cardiovascular system: regular rhythm, s1-s2 Gastrointestinal system: Nondistended,  nontender, pos BS Central nervous system: No seizures, no tremors Extremities: No cyanosis, no joint deformities Skin: No rashes, no pallor Psychiatry: Affect normal // no auditory hallucinations   Data Reviewed: I have personally reviewed following labs and imaging studies  CBC:  Recent Labs Lab 01/04/17 0250 01/05/17 0448 01/05/17 2124 01/06/17 0454 01/07/17 0628  WBC 13.2* 5.7  --  6.7 9.0  NEUTROABS 10.0*  --   --   --   --   HGB 8.5* 5.4* 8.7* 8.7* 8.1*  HCT 24.1* 15.8* 25.7* 25.4* 24.3*  MCV 91.3 93.5  --  91.4 92.7  PLT 285 246  --  264 517   Basic Metabolic Panel:  Recent Labs Lab 01/04/17 0250 01/05/17 0448 01/06/17 0454 01/07/17 0628  NA 132* 137 132* 132*  K 2.5* 5.5* 4.4 4.3  CL 94* 110 105 105  CO2 23 22 22  21*  GLUCOSE 94 115* 103* 97  BUN 15 8 5* 8  CREATININE 0.75 0.62 0.67 0.59*  CALCIUM 8.5* 8.3* 8.2* 8.0*  MG 1.3* 1.6* 2.1  --    GFR: Estimated Creatinine Clearance: 74.4 mL/min (A) (by C-G formula based on SCr of 0.59 mg/dL (L)). Liver Function Tests:  Recent Labs Lab 01/04/17 0250 01/05/17 0448 01/07/17 0628  AST 25 18 15   ALT 12* 8* 8*  ALKPHOS 72 51 45  BILITOT 0.9 0.6 0.4  PROT 6.1* 4.9* 4.5*  ALBUMIN 2.7* 2.0* 1.9*   No results for input(s): LIPASE, AMYLASE in the last 168 hours. No results for input(s): AMMONIA in the last 168 hours. Coagulation Profile:  Recent Labs Lab 01/04/17 0250  INR 1.09   Cardiac Enzymes: No results for input(s): CKTOTAL, CKMB, CKMBINDEX, TROPONINI in the last 168 hours. BNP (last 3 results) No results for input(s): PROBNP in the last 8760 hours. HbA1C: No results for input(s): HGBA1C in the last 72 hours. CBG: No results for input(s): GLUCAP in the last 168 hours. Lipid Profile: No results for input(s): CHOL, HDL, LDLCALC, TRIG, CHOLHDL, LDLDIRECT in the last 72 hours. Thyroid Function Tests: No results for input(s): TSH, T4TOTAL, FREET4, T3FREE, THYROIDAB in the last 72 hours. Anemia  Panel: No results for input(s): VITAMINB12, FOLATE, FERRITIN, TIBC, IRON, RETICCTPCT in the last 72 hours. Sepsis Labs:  Recent Labs Lab 01/04/17 0512 01/04/17 1608  LATICACIDVEN 0.9 1.3    No results found for this or any previous visit (from the past 240 hour(s)).   Radiology Studies: No results found.  Scheduled Meds: . feeding supplement (ENSURE ENLIVE)  237 mL Oral BID BM  . folic acid  1 mg Oral Daily  . Gerhardt's butt cream  1 application Topical BID  . hydrocortisone  25 mg Rectal BID  . LORazepam  0-4 mg Intravenous Q12H   Or  . LORazepam  0-4 mg Oral Q12H  . metoprolol tartrate  12.5 mg Oral BID  . multivitamin with minerals  1 tablet Oral Daily  . nicotine  21 mg Transdermal Daily  . pantoprazole  40 mg Oral BID  AC  . thiamine  100 mg Oral Daily   Continuous Infusions: . sodium chloride 75 mL/hr at 01/06/17 2155     LOS: 3 days   CHIU, Orpah Melter, MD [facility] Pager [phone]  If 7PM-7AM, please contact night-coverage [email or web address] Password [email or web address] 01/07/2017, 5:11 PM

## 2017-01-08 LAB — CBC
HCT: 25 % — ABNORMAL LOW (ref 39.0–52.0)
Hemoglobin: 8.4 g/dL — ABNORMAL LOW (ref 13.0–17.0)
MCH: 30.7 pg (ref 26.0–34.0)
MCHC: 33.6 g/dL (ref 30.0–36.0)
MCV: 91.2 fL (ref 78.0–100.0)
Platelets: 299 10*3/uL (ref 150–400)
RBC: 2.74 MIL/uL — AB (ref 4.22–5.81)
RDW: 19 % — ABNORMAL HIGH (ref 11.5–15.5)
WBC: 7.1 10*3/uL (ref 4.0–10.5)

## 2017-01-08 LAB — VITAMIN B12: VITAMIN B 12: 502 pg/mL (ref 180–914)

## 2017-01-08 MED ORDER — METOPROLOL TARTRATE 25 MG PO TABS: 12.5000 mg | ORAL_TABLET | Freq: Two times a day (BID) | ORAL | 0 refills | Status: DC

## 2017-01-08 MED ORDER — DIBUCAINE 1 % RE OINT: 1.0000 "application " | TOPICAL_OINTMENT | RECTAL | 0 refills | Status: DC | PRN

## 2017-01-08 NOTE — Discharge Summary (Addendum)
Physician Discharge Summary  Patrick [name] XLK:[id number] DOB: [birth year] DOA: 01/04/2017  PCP: [name], MD  Admit date: 01/04/2017 Discharge date: 01/08/2017  Admitted From: Home Disposition:  Home  Recommendations for Outpatient Follow-up:  1. Follow up with PCP in 1-2 weeks 2. Please follow up folic acid and D66 levels 3. 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 The patient was found lying on the floor having vomited dark black material. Admitted for upper GI bleed on 9/12. GI bleeding secondary to grade D reflux esophagitis, due to alcohol abuse, seen on EGD on 9/13. Hemoglobin at that time was 6.8. Patient left AGAINST MEDICAL ADVICE , on 9/15 while he was in the process of receiving a blood transfusion.. Apparently patient was also in the ED yesterday 9/26 for left AGAINST MEDICAL ADVICE. Brought in again today 9/27 from home due to rectal pain 4 days. Patient apparently was covered in feces from waist down per EMS. Patient is confused but denied any recurrence of melena or hematochezia. Currently his main complaint is pain in the rectal area  Principal Problem: Reflux esophagitis secondary to alcohol abuse seen on EGD on 9/30 -we'll continue patient on twice a day PPI -Patient with recent endoscopy demonstrating reflux esophagitis which is likely contributing to recent upper GI bleed -Presenting hemoglobin 8.5, down to 5.5 this morning. -Transfused 2 units of PRBCs -Given concerns of continued acute blood loss anemia, had consulted gastroenterology. -GI has since signed off with recommendations to further w/u  alcoholic anemia -CBC has remained stable  Rectal pain -CT pelvis unremarkable for acute process -Patient given trial of topical lidocaine with Anusol. -Per GI, on rectal exam, patient noted to be nontender with no fissure or other palpable abnormality noted. -Remained stable. Recommend continued outpatient f/u  Hypokalemia -replaced  Acute Blood loss anemia -patient down 3 g of hemoglobin this hospital admission -Patient was transfused 2 units of PRBCs with appropriate correction. Hemoglobin has since remained stable. -folic acid and Y40 levels ordered, pending. Recommend outpatient f/u  Hypomagnesemia,  -Corrected.  Alcohol abuse, chart indicates diagnosis of Korsakoff syndrome -Minimal use of Ativan. No evidence of acute withdrawal at this point. Continue CIWA as tolerated. -no evidence of withdrawals   Cigarette smoker -Nicotine patch  Dysphagia. Continue dysphagia 1 diet with nectar thick liquids as previously recommended -Stable at present  Severe protein calorie malnutrition  Discharge Diagnoses:  Principal Problem:   Esophagitis Active Problems:   Alcohol withdrawal ([place])    Discharge Instructions   Allergies as of 01/08/2017   No Known Allergies     Medication List    STOP taking these medications   amLODipine 5 MG tablet Commonly known as:  NORVASC   losartan 100 MG tablet Commonly known as:  COZAAR   metoprolol succinate 25 MG 24 hr tablet Commonly known as:  TOPROL-XL   metoprolol succinate 50 MG 24 hr tablet Commonly known as:  TOPROL-XL   potassium chloride SA 20 MEQ tablet Commonly known as:  K-DUR,KLOR-CON     TAKE these medications   albuterol 108 (90 Base) MCG/ACT inhaler Commonly known as:  VENTOLIN HFA Inhale 2 puffs into the lungs every 4 (four) hours as needed for wheezing or shortness of breath.  dibucaine 1 % Oint Commonly known as:  NUPERCAINAL Place 1 application rectally as needed (rectal pain).   feeding  supplement (ENSURE ENLIVE) Liqd Take 237 mLs by mouth 2 (two) times daily between meals.   ferrous sulfate 325 (65 FE) MG tablet Take 1 tablet (325 mg total) by mouth 3 (three) times daily with meals.   folic acid 1 MG tablet Commonly known as:  FOLVITE Take 1 tablet (1 mg total) by mouth daily.   Gerhardt's butt cream Crea Apply 1 application topically 2 (two) times daily.   guaifenesin 100 MG/5ML syrup Commonly known as:  ROBITUSSIN Take 200-400 mg by mouth 3 (three) times daily as needed for cough or congestion.   metoprolol tartrate 25 MG tablet Commonly known as:  LOPRESSOR Take 0.5 tablets (12.5 mg total) by mouth 2 (two) times daily.   milk thistle 175 MG tablet Take 175 mg by mouth daily.   multivitamin with minerals Tabs tablet Take 1 tablet by mouth daily.   nicotine 21 mg/24hr patch Commonly known as:  NICODERM CQ - dosed in mg/24 hours Place 1 patch (21 mg total) onto the skin daily.   Omeprazole-Sodium Bicarbonate 20-1100 MG Caps capsule Commonly known as:  ZEGERID Take 1 capsule by mouth daily before breakfast.   OVER THE COUNTER MEDICATION Take 1 capsule by mouth daily. "Liver Health complex" capsules   pantoprazole 40 MG tablet Commonly known as:  PROTONIX Take 1 tablet (40 mg total) by mouth 2 (two) times daily.   polyethylene glycol powder powder Commonly known as:  GLYCOLAX/MIRALAX Take 17 grams in 8 oz of water or juice daily. What changed:  how much to take  how to take this  when to take this  reasons to take this  additional instructions   [place] to liquids to make nectar thick.   thiamine 100 MG tablet Take 1 tablet (100 mg total) by mouth daily.      Follow-up Information    [facility] Follow [facility].   Why:  [place], social work Sport and exercise psychologist information: [address] [place] [address] [phone]        [name], MD. Schedule an appointment as soon as  possible for a visit in 1 week(s).   Specialty:  Family Medicine Contact information: [place] [address] [phone]          No Known Allergies  Consultations:  GI  Procedures/Studies: Ct Abdomen Pelvis W Contrast  Result Date: 01/04/2017 CLINICAL DATA:  Rectal pain and GI bleeding EXAM: CT ABDOMEN AND PELVIS WITH CONTRAST TECHNIQUE: Multidetector CT imaging of the abdomen and pelvis was performed using the standard protocol following bolus administration of intravenous contrast. CONTRAST:  100 mL Isovue-300 COMPARISON:  12/20/2016, 04/16/2013 FINDINGS: Lower chest: No acute abnormality. Moderate-sized hiatal hernia is noted. Hepatobiliary: Hypodensity is again noted in the right lobe of the liver likely representing a cyst slightly better visualized than that seen on the prior exam. This is stable from a prior ultrasound examination from 2015. Mild fatty infiltration of the liver is seen. The gallbladder again demonstrates dependent gallstones. Pancreas: Unremarkable. No pancreatic ductal dilatation or surrounding inflammatory changes. Spleen: Normal in size without focal abnormality. Adrenals/Urinary Tract: The adrenal glands are stable in appearance. The kidneys demonstrate a normal enhancement pattern. Renal cysts are noted bilaterally stable from the previous exam. The bladder is decompressed. Stomach/Bowel: Diverticular change of the colon is noted without evidence of diverticulitis. No  definitive colonic mass is seen. The appendix is within normal limits. No obstructive or inflammatory changes of the bowel are seen. Previously seen floor thickening is not well appreciated on the current exam and was likely related to peristalsis. Vascular/Lymphatic: Aortic atherosclerosis. No enlarged abdominal or pelvic lymph nodes. Reproductive: Prostate is unremarkable. Other: No abdominal wall hernia or abnormality. No abdominopelvic ascites. Musculoskeletal: Degenerative  changes of lumbar spine are noted. IMPRESSION: Chronic changes similar to that seen on the recent exam. No new focal abnormality is noted. Electronically Signed   By: Inez Catalina M.D.   On: 01/04/2017 08:01   Ct Abdomen Pelvis W Contrast  Result Date: 12/20/2016 CLINICAL DATA:  [place] when trying to answer the door, covered in coffee-ground emesis, abdominal pain starting yesterday EXAM: CT ABDOMEN AND PELVIS WITH CONTRAST TECHNIQUE: Multidetector CT imaging of the abdomen and pelvis was performed using the standard protocol following bolus administration of intravenous contrast. Sagittal and coronal MPR images reconstructed from axial data set. CONTRAST:  117mL ISOVUE-300 IOPAMIDOL (ISOVUE-300) INJECTION 61% IV. Water given as oral contrast. COMPARISON:  None FINDINGS: Lower chest: Minimal atelectasis at RIGHT lung base. Hepatobiliary: Diffuse fatty infiltration of liver. Nonspecific 13 x 9 mm low-attenuation lesion RIGHT lobe liver, slightly ill-defined, without enhancement or washout. Liver otherwise unremarkable. Dependent density in gallbladder question small gallstone. Pancreas: Normal appearance Spleen: Normal appearance Adrenals/Urinary Tract: Adrenal glands normal appearance. BILATERAL renal cysts largest lateral mid RIGHT kidney 3.0 x 2.8 cm image 28. Kidneys, ureters and bladder otherwise normal appearance Stomach/Bowel: Moderate sized hiatal hernia. Stomach distended by fluid. Wall thickening of the pylorus, cannot exclude component of gastric outlet obstruction. No adjacent infiltrative changes to suggest penetrating ulcer disease. Descending and sigmoid diverticulosis without evidence of diverticulitis. Normal appendix. Small bowel loops unremarkable. Vascular/Lymphatic: Atherosclerotic calcifications aorta and iliac arteries without aneurysm. No adenopathy. Reproductive: Prostatic calcifications and minimal enlargement. Unremarkable seminal vesicles. Other: No free air or free fluid. No definite  inflammatory process. No hernia. Musculoskeletal: Diffuse osseous demineralization. IMPRESSION: Distended stomach with a moderate sized hiatal hernia and thickening of the pylorus raising question of gastric outlet obstruction. Distal colonic diverticulosis without evidence of diverticulitis. Fatty infiltration of liver with a nonspecific slightly ill-defined 13 x 9 mm lesion in the RIGHT lobe ; followup nonemergent characterization by MR imaging recommended. Cholelithiasis. Electronically Signed   By: Lavonia Dana M.D.   On: 12/20/2016 15:18   Dg Chest Port 1 View  Result Date: 12/20/2016 CLINICAL DATA:  Fever.  History of tobacco abuse. EXAM: PORTABLE CHEST 1 VIEW COMPARISON:  Chest radiograph November 29, 2016 FINDINGS: Cardiac silhouette is mildly enlarged. Mediastinal silhouette is nonsuspicious, calcified aortic knob. Calcified aortic knob. No pleural effusion or focal consolidation. Increased lung volumes. No pneumothorax. Moderate hiatal hernia. Old RIGHT rib fractures. Soft tissue planes are nonsuspicious. IMPRESSION: 1. Mild cardiomegaly and COPD. 2. Moderate hiatal hernia. Electronically Signed   By: Elon Alas M.D.   On: 12/20/2016 21:39    Subjective: Complaining of rectal discomfort  Discharge Exam: Vitals:   01/08/17 0629 01/08/17 1010  BP: (!) 123/94 121/80  Pulse: 99 (!) 102  Resp: 18   Temp: 98.6 F (37 C)   SpO2: 100%    Vitals:   01/07/17 1436 01/07/17 2334 01/08/17 0629 01/08/17 1010  BP: 123/84 122/86 (!) 123/94 121/80  Pulse: 97 99 99 (!) 102  Resp: 18 18 18    Temp: 98.7 F (37.1 C) 99 F (37.2 C) 98.6 F (37 C)   TempSrc: Oral  Oral Oral   SpO2: 100% 99% 100%   Weight:      Height:        General: Pt is alert, awake, not in acute distress Cardiovascular: RRR, S1/S2 +, no rubs, no gallops Respiratory: CTA bilaterally, no wheezing, no rhonchi Abdominal: Soft, NT, ND, bowel sounds + Extremities: no edema, no cyanosis   The results of significant  diagnostics from this hospitalization (including imaging, microbiology, ancillary and laboratory) are listed below for reference.     Microbiology: No results found for this or any previous visit (from the past 240 hour(s)).   Labs: BNP (last 3 results) No results for input(s): BNP in the last 8760 hours. Basic Metabolic Panel:  Recent Labs Lab 01/04/17 0250 01/05/17 0448 01/06/17 0454 01/07/17 0628  NA 132* 137 132* 132*  K 2.5* 5.5* 4.4 4.3  CL 94* 110 105 105  CO2 23 22 22  21*  GLUCOSE 94 115* 103* 97  BUN 15 8 5* 8  CREATININE 0.75 0.62 0.67 0.59*  CALCIUM 8.5* 8.3* 8.2* 8.0*  MG 1.3* 1.6* 2.1  --    Liver Function Tests:  Recent Labs Lab 01/04/17 0250 01/05/17 0448 01/07/17 0628  AST 25 18 15   ALT 12* 8* 8*  ALKPHOS 72 51 45  BILITOT 0.9 0.6 0.4  PROT 6.1* 4.9* 4.5*  ALBUMIN 2.7* 2.0* 1.9*   No results for input(s): LIPASE, AMYLASE in the last 168 hours. No results for input(s): AMMONIA in the last 168 hours. CBC:  Recent Labs Lab 01/04/17 0250 01/05/17 0448 01/05/17 2124 01/06/17 0454 01/07/17 0628 01/08/17 0530  WBC 13.2* 5.7  --  6.7 9.0 7.1  NEUTROABS 10.0*  --   --   --   --   --   HGB 8.5* 5.4* 8.7* 8.7* 8.1* 8.4*  HCT 24.1* 15.8* 25.7* 25.4* 24.3* 25.0*  MCV 91.3 93.5  --  91.4 92.7 91.2  PLT 285 246  --  264 273 299   Cardiac Enzymes: No results for input(s): CKTOTAL, CKMB, CKMBINDEX, TROPONINI in the last 168 hours. BNP: Invalid input(s): POCBNP CBG: No results for input(s): GLUCAP in the last 168 hours. D-Dimer No results for input(s): DDIMER in the last 72 hours. Hgb A1c No results for input(s): HGBA1C in the last 72 hours. Lipid Profile No results for input(s): CHOL, HDL, LDLCALC, TRIG, CHOLHDL, LDLDIRECT in the last 72 hours. Thyroid function studies No results for input(s): TSH, T4TOTAL, T3FREE, THYROIDAB in the last 72 hours.  Invalid input(s): FREET3 Anemia work up No results for input(s): VITAMINB12, FOLATE, FERRITIN,  TIBC, IRON, RETICCTPCT in the last 72 hours. Urinalysis    Component Value Date/Time   COLORURINE YELLOW 12/20/2016 1251   APPEARANCEUR HAZY (A) 12/20/2016 1251   LABSPEC 1.013 12/20/2016 1251   PHURINE 5.0 12/20/2016 1251   GLUCOSEU 50 (A) 12/20/2016 1251   HGBUR SMALL (A) 12/20/2016 1251   HGBUR negative 02/21/2010 1055   BILIRUBINUR NEGATIVE 12/20/2016 1251   BILIRUBINUR n 03/11/2014 1025   KETONESUR 20 (A) 12/20/2016 1251   PROTEINUR NEGATIVE 12/20/2016 1251   UROBILINOGEN 0.2 03/11/2014 1025   UROBILINOGEN 0.2 02/21/2010 1055   NITRITE NEGATIVE 12/20/2016 1251   LEUKOCYTESUR NEGATIVE 12/20/2016 1251   Sepsis Labs Invalid input(s): PROCALCITONIN,  WBC,  LACTICIDVEN Microbiology No results found for this or any previous visit (from the past 240 hour(s)).   SIGNED:   Donne Hazel, MD  [facility] 01/08/2017, 11:58 AM  If 7PM-7AM, please contact night-coverage [email or web address] Password [email or web address]

## 2017-01-08 NOTE — Progress Notes (Signed)
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 The patient was found lying on the floor having vomited dark black material. Admitted for upper GI bleed on 9/12. GI bleeding secondary to grade D reflux esophagitis, due to alcohol abuse, seen on EGD on 9/13. Hemoglobin at that time was 6.8. Patient left AGAINST MEDICAL ADVICE , on 9/15 while he was in the process of receiving a blood transfusion.. Apparently patient was also in the ED  9/26 and left AGAINST MEDICAL ADVICE. Brought in again 9/27 from home due to rectal pain 4 days. Dx of esophagitis, anemia (Hgb 5.4), rectal pain.     PT Comments    Pt assisted with ambulating in hallway.  Pt's mobility appears improved however pt still presents with decreased safety awareness (pt attempting to sit while turning, assist for lines, and not using RW).    Follow Up Recommendations  Supervision/Assistance - 24 hour;Home health PT (pt declines SNF)     Equipment Recommendations  None recommended by PT    Recommendations for Other Services       Precautions / Restrictions Precautions Precautions: Fall    Mobility  Bed Mobility Overal bed mobility: Needs Assistance Bed Mobility: Supine to Sit     Supine to sit: Supervision     General bed mobility comments: increased time  Transfers Overall transfer level: Needs assistance Equipment used: Rolling walker (2 wheeled) Transfers: Sit to/from Stand Sit to Stand: Min guard         General transfer comment: min/guard for safety, cues for hand placement however pt had hands on RW  Ambulation/Gait Ambulation/Gait  assistance: Min guard Ambulation Distance (Feet): 350 Feet Assistive device: Rolling walker (2 wheeled) Gait Pattern/deviations: Step-through pattern;Decreased stride length     General Gait Details: verbal cues for safety with RW   Stairs            Wheelchair Mobility    Modified Rankin (Stroke Patients Only)       Balance Overall balance assessment: Needs assistance         Standing balance support: Bilateral upper extremity supported Standing balance-Leahy Scale: Poor Standing balance comment: requires UE support                            Cognition Arousal/Alertness: Awake/alert Behavior During Therapy: Flat affect Overall Cognitive Status: Impaired/Different from baseline                         Following Commands: Follows one step commands with increased time Safety/Judgement: Decreased awareness of safety;Decreased awareness of deficits   Problem Solving: Slow processing;Decreased initiation;Difficulty sequencing;Requires verbal cues General Comments: continues to demonstrate poor safety awareness      Exercises      General Comments        Pertinent Vitals/Pain Pain Assessment: Faces Faces Pain Scale: Hurts a little bit Pain Intervention(s): Repositioned;Limited activity within patient's tolerance;Monitored during session    Home Living                      Prior Function            PT Goals (  current goals can now be found in the care plan section) Progress towards PT goals: Progressing toward goals    Frequency    Min 3X/week      PT Plan      Co-evaluation              AM-PAC PT "6 Clicks" Daily Activity  Outcome Measure  Difficulty turning over in bed (including adjusting bedclothes, sheets and blankets)?: A Little Difficulty moving from lying on back to sitting on the side of the bed? : A Little Difficulty sitting down on and standing up from a chair with arms (e.g., wheelchair,  bedside commode, etc,.)?: A Little Help needed moving to and from a bed to chair (including a wheelchair)?: A Little Help needed walking in hospital room?: A Little   6 Click Score: 15    End of Session Equipment Utilized During Treatment: Gait belt Activity Tolerance: Patient tolerated treatment well Patient left: with call bell/phone within reach;in bed;with bed alarm set   PT Visit Diagnosis: Difficulty in walking, not elsewhere classified (R26.2)     Time: 9924-2683 PT Time Calculation (min) (ACUTE ONLY): 13 min  Charges:  $Gait Training: 8-22 mins                    G Codes:      Carmelia Bake, PT, DPT 01/08/2017 Pager: [phone]  York Ram E 01/08/2017, 11:55 AM

## 2017-01-08 NOTE — Care Management Note (Signed)
Case Management Note  Patient Details  Name: Patrick [name] MRN: [id number] Date of Birth: 1950/09/04  Subjective/Objective:  PT recc SNF-patient declines SNF. Patient provided w/HHC agency list-chose [facility] rep [name] aware of d/c & HHRN-meds, compliance/HH CSW. Patient states his ex wife wil transport him home.                 Action/Plan:d/c plan home w/HHC.   Expected Discharge Date:   (UNKNOWN)               Expected Discharge Plan:  [facility]  In-House Referral:  Clinical Social Work  Discharge planning Services  CM Consult  Post Acute Care Choice:  Durable Medical Equipment (has rw) Choice offered to:  Patient  DME Arranged:    DME Agency:     HH Arranged:  RN, Social Work [employer] Agency:  [facility]  Status of Service:  Completed, signed off  If discussed at [employer], dates discussed:    Additional Comments:  Dessa Phi, RN 01/08/2017, 10:45 AM

## 2017-01-08 NOTE — Consult Note (Signed)
[facility] Nurse wound consult note Reason for Consult:Incontinence associated dermatitis (IAD) after being found down at home covered in excrement.  Left gluteal fold is denuded and tender to touch.  Blanchable erythema to all of buttock and sacral skin, including scrotum.  Wound type:Moisture (incontinence) associated skin damage Pressure Injury POA: Yes Measurement: 8 cm x 8 cm erythema to left gluteal area with scattered nonintact lesions.   Wound TMB:PJPE and moist Drainage (amount, consistency, odor) scant serous weeping Periwound:blanchable erythema Dressing procedure/placement/frequency:Condom catheter, prompt incontinence care.  Gerhardt butt cream twice daily.  Will not follow at this time.  Please re-consult if needed.  Domenic Moras RN BSN [place] Pager [phone]

## 2017-01-08 NOTE — Plan of Care (Signed)
Problem: Safety: Goal: Ability to remain free from injury will improve Outcome: Completed/Met Date Met: 01/08/17 Discussed safety prevention with patient. Pt lives alone. Pt encouraged to keep home well lit

## 2017-01-08 NOTE — Clinical Social Work Note (Signed)
Clinical Social Work Assessment  Patient Details  Name: Patrick [name] MRN: [id number] Date of Birth: [birth year]  Date of referral:  01/08/17               Reason for consult:  Facility Placement                Permission sought to share information with:    Permission granted to share information::  No  Name::        Agency::     Relationship::     Contact Information:     Housing/Transportation Living arrangements for the past 2 months:  Apartment Source of Information:  Patient Patient Interpreter Needed:  None Criminal Activity/Legal Involvement Pertinent to Current Situation/Hospitalization:  No - Comment as needed Significant Relationships:  Other(Comment) (Ex-wife) Lives with:  Self Do you feel safe going back to the place where you live?  Yes Need for family participation in patient care:  No (Coment)  Care giving concerns:  Patient from home alone. PT is recommending SNF, patient refusing. Per chart review patient was found covered in feces when picked up by EMS. CSW inquired about report of paitent being covered in feces, Patient reported that he fell and was not able to get to the restroom. Patient reported no care giving concerns. Patient reported that he uses a walker to get around his home and that he bathes and dresses himself. Patient reported that he hires someone to clean his home and that he orders takeout for food. Patient reported that he is not agreeable to SNF because he has been to rehab before and he does not feel that he needs rehab again. RNCM working with patient and patient agreed to Home health services.    Social Worker assessment / plan:  CSW spoke with patient at bedside with [facility] regarding PT recommendation for SNF. Patient reported that he is not interested. RNCM set up home health services for patient. Patient reported that he feels safe returning home. CSW discussed private duty care agencies and in home aide services with patient and provided  resources. Patient reported that his ex-wife will be transporting him home.   Employment status:  Unemployed Forensic scientist:  Self Pay (Medicaid Pending) PT Recommendations:  [place] (Patient refusing ) Information / Referral to community resources:  Other (Comment Required) (RNCM for [facility] services)  Patient/Family's Response to care:  Patient declining SNF and is agreeable to [facility] services.   Patient/Family's Understanding of and Emotional Response to Diagnosis, Current Treatment, and Prognosis:  Patient presented guarded during assessment with minimal speech. Patient verbalized a plan to discharge home with home health. Patient reported that his ex-wife would be providing him transportation home. Patient did not discuss diagnosis nor current treatment.   Emotional Assessment Appearance:  Appears stated age Attitude/Demeanor/Rapport:  Guarded Affect (typically observed):  Guarded Orientation:  Oriented to Self, Oriented to Place, Oriented to Situation Alcohol / Substance use:  Alcohol Use Psych involvement (Current and /or in the community):  No (Comment)  Discharge Needs  Concerns to be addressed:  Patient refuses services, Home Safety Concerns Readmission within the last 30 days:  Yes Current discharge risk:  Lives alone Barriers to Discharge:  No Barriers Identified   Burnis Medin, LCSW 01/08/2017, 4:43 PM

## 2017-01-08 NOTE — Progress Notes (Signed)
Patient is A&Ox4, ambulatory without assistance. Discharge instructions reviewed for second time with patient. Family at bedside. Pt encouraged to schedule;e post DC f/u appt with PCP. Ointments/creams and pill splitter given to patient.

## 2017-01-08 NOTE — Progress Notes (Signed)
Patient remains alert, oriented x4 but forgetful. Discharge instructions reviewed. Questions concerns denied. Pt is ambulatory with walker. Pt reports transportation has been arranged. Ex wife will be transporting. Pt is not a candidate for bus and or cab transportation.

## 2017-01-08 NOTE — Progress Notes (Signed)
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 The patient was found lying on the floor having vomited dark black material. Admitted for upper GI bleed on 9/12. GI bleeding secondary to grade D reflux esophagitis, due to alcohol abuse, seen on EGD on 9/13. Hemoglobin at that time was 6.8. Patient left AGAINST MEDICAL ADVICE , on 9/15 while he was in the process of receiving a blood transfusion.. Apparently patient was also in the ED  9/26 and left AGAINST MEDICAL ADVICE. Brought in again 9/27 from home due to rectal pain 4 days. Dx of esophagitis, anemia (Hgb 5.4), rectal pain.    OT comments  Pt demonstrates very poor safety awareness, impaired problem solving, and attention.  He requires min guard assist for functional mobility (very unsteady, refusing to use RW, insteady relying on furniture and walls to steady.   He demonstrates poor hygiene - refuses to wash hands despite obvious stool and dirt under his nails.  He is refusing SNF, and plans to discharge home.  Feel he is at very high risk for falls and readmission.  Recommend 24 hour supervision and HHOT  Follow Up Recommendations  Home health OT;Supervision/Assistance - 24 hour    Equipment Recommendations  Other (comment) (recommend shower seat, pt refuses )    Recommendations for Other Services      Precautions / Restrictions Precautions Precautions: Fall Precaution Comments: incontinent during evaluation       Mobility Bed Mobility Overal bed mobility: Needs Assistance Bed Mobility: Sit to Supine     Supine to sit: Supervision Sit to supine: Supervision    General bed mobility comments: increased time  Transfers Overall transfer level: Needs assistance Equipment used: Rolling walker (2 wheeled) Transfers: Sit to/from [facility] Sit to Stand: Min guard Stand pivot transfers: Min guard       General transfer comment: min guard for safety.  Pt with stumbling gait.  Refuses walker use.  relies on walls and furniture to stablize self     Balance Overall balance assessment: Needs assistance Sitting-balance support: Feet supported Sitting balance-Leahy Scale: Fair     Standing balance support: Single extremity supported;During functional activity Standing balance-Leahy Scale: Poor Standing balance comment: requires UE support                           ADL either performed or assessed with clinical judgement   ADL Overall ADL's : Needs assistance/impaired       Grooming Details (indicate cue type and reason): Pt with stool and dirt under fingernails.  Is not aware of this, and when cued to wash/clean hands, he insists his hands are clean    Upper Body Bathing Details (indicate cue type and reason): Pt refused despite strong smell of body odor  Lower Body Bathing: Moderate assistance;Sit to/from stand Lower Body Bathing Details (indicate cue type and reason): Pt with stool on his bottom.  Required cues to peform peri care.  Refused to bathe scrotal area          Toilet Transfer: Min guard;Ambulation;Comfort height toilet;Grab bars Toilet Transfer Details (indicate cue type and reason): Pt very unsteady, using wals and furniture to steady himself  Toileting-  Clothing Manipulation and Hygiene: Min guard;Sit to/from stand Toileting - Clothing Manipulation Details (indicate cue type and reason): cues for thoroughness      Functional mobility during ADLs: Min guard       Vision       Perception     Praxis      Cognition Arousal/Alertness: Awake/alert Behavior During Therapy: Flat  affect Overall Cognitive Status: Impaired/Different from baseline Area of Impairment: Attention;Memory;Following commands;Safety/judgement;Problem solving                   Current Attention Level: Sustained;Selective Memory: Decreased short-term memory Following Commands: Follows one step commands consistently Safety/Judgement: Decreased awareness of safety;Decreased awareness of deficits   Problem Solving: Slow processing;Decreased initiation;Difficulty sequencing;Requires verbal cues;Requires tactile cues General Comments: Pt with very poor safety awareness.  Sustained to selective attention.   Requires mod cues for sequencing ADLs and for thoroughness.  He refuses to perform basic hygien tasks when cued          Exercises     Shoulder Instructions       General Comments      Pertinent Vitals/ Pain       Pain Assessment: Faces Faces Pain Scale: No hurt Pain Intervention(s): Repositioned;Limited activity within patient's tolerance;Monitored during session  Home Living                                          Prior Functioning/Environment              Frequency  Min 2X/week        Progress Toward Goals  OT Goals(current goals can now be found in the care plan section)  Progress towards OT goals: Progressing toward goals     Plan Discharge plan needs to be updated    Co-evaluation                 AM-PAC PT "6 Clicks" Daily Activity     Outcome Measure   Help from another person eating meals?: None Help from another person taking care of personal grooming?: A Little Help from another person toileting, which includes using toliet, bedpan, or urinal?: A Little Help from another person bathing (including washing, rinsing, drying)?: A Lot Help from another person to put on and taking off regular upper body clothing?: A Little Help from another person to put on and taking off regular lower body clothing?: A Little 6 Click  Score: 18    End of Session    OT Visit Diagnosis: Unsteadiness on feet (R26.81);Other abnormalities of gait and mobility (R26.89);Muscle weakness (generalized) (M62.81);Pain;Other symptoms and signs involving cognitive function   Activity Tolerance Patient tolerated treatment well   Patient Left in bed;with call bell/phone within reach;with bed alarm set   Nurse Communication Mobility status        Time: 0623-7628 OT Time Calculation (min): 14 min  Charges: OT General Charges $OT Visit: 1 Visit OT Treatments $Self Care/Home Management : 8-22 mins  [facility], OTR/L [phone]    Lucille Passy M 01/08/2017, 1:25 PM

## 2017-01-09 ENCOUNTER — Telehealth: Payer: Self-pay | Admitting: *Deleted

## 2017-01-09 LAB — FOLATE RBC
FOLATE, HEMOLYSATE: 531.8 ng/mL
Folate, RBC: 1840 ng/mL (ref >498)
Hematocrit: 28.9 % — ABNORMAL LOW (ref 37.5–51.0)

## 2017-01-09 NOTE — Telephone Encounter (Signed)
Unable to reach patient at time of TCM Call. Left message for patient to return call when available.  

## 2017-01-09 NOTE — Telephone Encounter (Signed)
Patient not available at time of TCM call. He requests call back later this afternoon.

## 2017-01-10 NOTE — Telephone Encounter (Signed)
Unable to reach patient at time of TCM Call. Left message for patient to return call when available.  

## 2017-01-10 NOTE — Progress Notes (Signed)
Patrick [name]  Mr. Burmester is uninsured and does not meet the qualifications for [facility] care with [place]. Dessa Phi, Case Manager, was made aware.   Patrick [name] 01/10/2017, 11:47 AM

## 2017-01-12 NOTE — Telephone Encounter (Signed)
Unable to reach patient at time of TCM Call. Left message for patient to return call when available.  

## 2017-01-16 NOTE — Discharge Summary (Signed)
Patrick [name], is a 66 y.o. male  DOB 1950/08/24  MRN [id number].  Admission date:  12/20/2016  Admitting Physician  Annita Brod, MD  Discharge Date:  12/23/2016  Primary MD  [name], MD  Recommendations for primary care physician for things to follow:   Upper GI bleed secondary to LA grade D reflux esophagitissecondary to alcohol abuse seen on EGD 9/13. -No recurrent bleeding thus far.  Continue BID PPI per GI abstain from alcoholpermanently Please check cbc in 1 week  Acute blood loss anemia S/p 1 unit prbc Transfuse 2 units prbc today Continue PPI please check cbc in 1 week  Hypokalemia Replete Check cmp in 1 week  Hypomagnesemia Mag iv Check magnesium in 1 week  Protein calorie malnutrition , severe prostat  Alcohol abuse, chart indicates diagnosis of Korsakoff syndrome -Minimal use of Ativan. No evidence of acute withdrawal at this point. Continue CIWA.  Acute kidney injurysecondary to upper GI bleed,acute blood loss anemia. -Resolved with IV fluids. Check cmp in 1 week  Dysphagia. Continue dysphagia 1 diet with nectar thick liquids as previously recommended. Medications crushed with pure.  Cigarette smoker -Nicotine patch  Pressure ulcer sacral region stage II present on admission.   Admission Diagnosis  Alcohol abuse [F10.10] Upper GI bleed [K92.2] Leukocytosis, unspecified type [D72.829]   Discharge Diagnosis  Alcohol abuse [F10.10] Upper GI bleed [K92.2] Leukocytosis, unspecified type [D72.829]    Principal Problem:   Upper GI bleed Active Problems:   TOBACCO USE   Alcohol dependence (HCC)   Pressure ulcer of sacral region, stage 2   Alcohol abuse   AKI (acute kidney injury) ([place])      Past Medical History:  Diagnosis Date  . Colon polyps   . Depression   . Diverticulitis   . Diverticulosis of colon   . GERD  (gastroesophageal reflux disease)   . Kidney stone     Past Surgical History:  Procedure Laterality Date  . COLONOSCOPY    . ESOPHAGOGASTRODUODENOSCOPY (EGD) WITH PROPOFOL N/A 12/21/2016   Procedure: ESOPHAGOGASTRODUODENOSCOPY (EGD) WITH PROPOFOL;  Surgeon: Doran Stabler, MD;  Location: [facility];  Service: Gastroenterology;  Laterality: N/A;  . UPPER GASTROINTESTINAL ENDOSCOPY         HPI  from the history and physical done on the day of admission:      66 y.o. male with medical history significant for continued alcohol abuse, depression  whose ex-wife called EMS after she had not heard from him for several days. EMS entered the house and found the patient on the ground. He had been trying to get to the door but collapsed on the ground. Patient related to them that he had been throwing up dark black looking vomit several days this past week as well as been passing black tarry stool.  Patient also continues to drink, usually 2 glasses of gin a night, last drink was 2 days prior. He was brought in by EMS to the ER  ED Course: In the ER, patient's  hemoglobin was found to be 8.9 each in review of previous blood work is actually close to his baseline. However, he was noted to be somewhat hemoconcentrated and a white count of 19.8 as well as acute kidney injury with creatinine 1.32, and his baseline is normal. Other lab work was noteworthy for a glucose of 191-no history of diabetes as well as a lactic acid level of 5.14. Patient was started on IV fluids and typed and crossed. GI was consulted erect minutes starting patient on IV Protonix and plans for endoscopy on the morning. Hospitalists were called for further evaluation and admission. Patient was placed in stepdown unit. In addition, EKG noted a flutter, this has since resolved.     Hospital Course:      Pt was admitted for GI bleeding, seen by Dr. Wilfrid Lund who recommended protonix gtt.  EGD 9/13=> LA Grade D reflux  esophagitis. This is the source of coffee grounds emesis.                           - 10 cm hiatal hernia.                           - Normal stomach.                           - Normal examined duodenum. Pt hgb remained stable after transfusion.   Pt was continued on CIWA,  Pt was recommended to have dysphagia 1 diet with nectar thick liquids by speech therapy.  Hgb 6.8 on 9/15 and pt was put in for transfusion of 2 units prbc.  He was in the middle of transfusion when he decided to sign out AMA.       Consults obtained - GI  Discharge Condition: stable  Diet and Activity recommendation: See Discharge Instructions below  Discharge Instructions      Discharge Medications     Allergies as of 12/23/2016   No Known Allergies     Medication List    ASK your doctor about these medications   albuterol 108 (90 Base) MCG/ACT inhaler Commonly known as:  VENTOLIN HFA Inhale 2 puffs into the lungs every 4 (four) hours as needed for wheezing or shortness of breath.   feeding supplement (ENSURE ENLIVE) Liqd Take 237 mLs by mouth 2 (two) times daily between meals.   ferrous sulfate 325 (65 FE) MG tablet Take 1 tablet (325 mg total) by mouth 3 (three) times daily with meals.   folic acid 1 MG tablet Commonly known as:  FOLVITE Take 1 tablet (1 mg total) by mouth daily.   Gerhardt's butt cream Crea Apply 1 application topically 2 (two) times daily.   nicotine 21 mg/24hr patch Commonly known as:  NICODERM CQ - dosed in mg/24 hours Place 1 patch (21 mg total) onto the skin daily.   pantoprazole 40 MG tablet Commonly known as:  PROTONIX Take 1 tablet (40 mg total) by mouth 2 (two) times daily.   polyethylene glycol powder powder Commonly known as:  GLYCOLAX/MIRALAX Take 17 grams in 8 oz of water or juice daily.   RESOURCE THICKENUP CLEAR Powd Add to liquids to make nectar thick.   thiamine 100 MG tablet Take 1 tablet (100 mg total) by mouth daily.       Major procedures  and Radiology Reports - PLEASE review detailed and final  reports for all details, in brief -     Ct Abdomen Pelvis W Contrast  Result Date: 01/04/2017 CLINICAL DATA:  Rectal pain and GI bleeding EXAM: CT ABDOMEN AND PELVIS WITH CONTRAST TECHNIQUE: Multidetector CT imaging of the abdomen and pelvis was performed using the standard protocol following bolus administration of intravenous contrast. CONTRAST:  100 mL Isovue-300 COMPARISON:  12/20/2016, 04/16/2013 FINDINGS: Lower chest: No acute abnormality. Moderate-sized hiatal hernia is noted. Hepatobiliary: Hypodensity is again noted in the right lobe of the liver likely representing a cyst slightly better visualized than that seen on the prior exam. This is stable from a prior ultrasound examination from 2015. Mild fatty infiltration of the liver is seen. The gallbladder again demonstrates dependent gallstones. Pancreas: Unremarkable. No pancreatic ductal dilatation or surrounding inflammatory changes. Spleen: Normal in size without focal abnormality. Adrenals/Urinary Tract: The adrenal glands are stable in appearance. The kidneys demonstrate a normal enhancement pattern. Renal cysts are noted bilaterally stable from the previous exam. The bladder is decompressed. Stomach/Bowel: Diverticular change of the colon is noted without evidence of diverticulitis. No definitive colonic mass is seen. The appendix is within normal limits. No obstructive or inflammatory changes of the bowel are seen. Previously seen floor thickening is not well appreciated on the current exam and was likely related to peristalsis. Vascular/Lymphatic: Aortic atherosclerosis. No enlarged abdominal or pelvic lymph nodes. Reproductive: Prostate is unremarkable. Other: No abdominal wall hernia or abnormality. No abdominopelvic ascites. Musculoskeletal: Degenerative changes of lumbar spine are noted. IMPRESSION: Chronic changes similar to that seen on the recent exam. No new focal abnormality is  noted. Electronically Signed   By: Inez Catalina M.D.   On: 01/04/2017 08:01   Ct Abdomen Pelvis W Contrast  Result Date: 12/20/2016 CLINICAL DATA:  [place] when trying to answer the door, covered in coffee-ground emesis, abdominal pain starting yesterday EXAM: CT ABDOMEN AND PELVIS WITH CONTRAST TECHNIQUE: Multidetector CT imaging of the abdomen and pelvis was performed using the standard protocol following bolus administration of intravenous contrast. Sagittal and coronal MPR images reconstructed from axial data set. CONTRAST:  185mL ISOVUE-300 IOPAMIDOL (ISOVUE-300) INJECTION 61% IV. Water given as oral contrast. COMPARISON:  None FINDINGS: Lower chest: Minimal atelectasis at RIGHT lung base. Hepatobiliary: Diffuse fatty infiltration of liver. Nonspecific 13 x 9 mm low-attenuation lesion RIGHT lobe liver, slightly ill-defined, without enhancement or washout. Liver otherwise unremarkable. Dependent density in gallbladder question small gallstone. Pancreas: Normal appearance Spleen: Normal appearance Adrenals/Urinary Tract: Adrenal glands normal appearance. BILATERAL renal cysts largest lateral mid RIGHT kidney 3.0 x 2.8 cm image 28. Kidneys, ureters and bladder otherwise normal appearance Stomach/Bowel: Moderate sized hiatal hernia. Stomach distended by fluid. Wall thickening of the pylorus, cannot exclude component of gastric outlet obstruction. No adjacent infiltrative changes to suggest penetrating ulcer disease. Descending and sigmoid diverticulosis without evidence of diverticulitis. Normal appendix. Small bowel loops unremarkable. Vascular/Lymphatic: Atherosclerotic calcifications aorta and iliac arteries without aneurysm. No adenopathy. Reproductive: Prostatic calcifications and minimal enlargement. Unremarkable seminal vesicles. Other: No free air or free fluid. No definite inflammatory process. No hernia. Musculoskeletal: Diffuse osseous demineralization. IMPRESSION: Distended stomach with a moderate  sized hiatal hernia and thickening of the pylorus raising question of gastric outlet obstruction. Distal colonic diverticulosis without evidence of diverticulitis. Fatty infiltration of liver with a nonspecific slightly ill-defined 13 x 9 mm lesion in the RIGHT lobe ; followup nonemergent characterization by MR imaging recommended. Cholelithiasis. Electronically Signed   By: Lavonia Dana M.D.   On: 12/20/2016 15:18   Dg  Chest Port 1 View  Result Date: 12/20/2016 CLINICAL DATA:  Fever.  History of tobacco abuse. EXAM: PORTABLE CHEST 1 VIEW COMPARISON:  Chest radiograph November 29, 2016 FINDINGS: Cardiac silhouette is mildly enlarged. Mediastinal silhouette is nonsuspicious, calcified aortic knob. Calcified aortic knob. No pleural effusion or focal consolidation. Increased lung volumes. No pneumothorax. Moderate hiatal hernia. Old RIGHT rib fractures. Soft tissue planes are nonsuspicious. IMPRESSION: 1. Mild cardiomegaly and COPD. 2. Moderate hiatal hernia. Electronically Signed   By: Elon Alas M.D.   On: 12/20/2016 21:39    Micro Results     No results found for this or any previous visit (from the past 240 hour(s)).     Today   Subjective    Patrick [name] today has left AMA   Objective   Blood pressure (!) 138/94, pulse (!) 110, temperature 98.4 F (36.9 C), temperature source Oral, resp. rate 16, height 5\' 8"  (1.727 m), weight 59.9 kg (132 lb 0.9 oz), SpO2 100 %.  No intake or output data in the 24 hours ending 01/16/17 2101  Exam Awake Alert, Oriented x 3, No new F.N deficits, Normal affect [place].AT,PERRAL Supple Neck,No JVD, No cervical lymphadenopathy appriciated.  Symmetrical Chest wall movement, Good air movement bilaterally, CTAB RRR,No Gallops,Rubs or new Murmurs, No Parasternal Heave +ve B.Sounds, Abd Soft, Non tender, No organomegaly appriciated, No rebound -guarding or rigidity. No Cyanosis, Clubbing or edema,  Stage 2 decub   Data Review   Labs  reviewed   Total Time in preparing paper work, data evaluation and todays exam - 78 minutes  Jani Gravel M.D on 01/16/2017 at 9:01 PM  [facility]   Office  [phone]

## 2017-04-09 ENCOUNTER — Inpatient Hospital Stay (HOSPITAL_COMMUNITY)
Admission: EM | Admit: 2017-04-09 | Discharge: 2017-04-16 | DRG: 391 | Disposition: A | Discharge status: Home Health | Payer: Self-pay | Attending: Family Medicine | Admitting: Family Medicine

## 2017-04-09 ENCOUNTER — Emergency Department (HOSPITAL_COMMUNITY): Payer: Self-pay

## 2017-04-09 DIAGNOSIS — D62 Acute posthemorrhagic anemia: Secondary | ICD-10-CM | POA: Diagnosis present

## 2017-04-09 DIAGNOSIS — F1010 Alcohol abuse, uncomplicated: Secondary | ICD-10-CM | POA: Diagnosis present

## 2017-04-09 DIAGNOSIS — D500 Iron deficiency anemia secondary to blood loss (chronic): Secondary | ICD-10-CM | POA: Diagnosis present

## 2017-04-09 DIAGNOSIS — Z9114 Patient's other noncompliance with medication regimen: Secondary | ICD-10-CM

## 2017-04-09 DIAGNOSIS — F4321 Adjustment disorder with depressed mood: Secondary | ICD-10-CM

## 2017-04-09 DIAGNOSIS — J189 Pneumonia, unspecified organism: Secondary | ICD-10-CM

## 2017-04-09 DIAGNOSIS — E43 Unspecified severe protein-calorie malnutrition: Secondary | ICD-10-CM

## 2017-04-09 DIAGNOSIS — F1026 Alcohol dependence with alcohol-induced persisting amnestic disorder: Secondary | ICD-10-CM | POA: Diagnosis present

## 2017-04-09 DIAGNOSIS — Z515 Encounter for palliative care: Secondary | ICD-10-CM

## 2017-04-09 DIAGNOSIS — F17210 Nicotine dependence, cigarettes, uncomplicated: Secondary | ICD-10-CM | POA: Diagnosis present

## 2017-04-09 DIAGNOSIS — K922 Gastrointestinal hemorrhage, unspecified: Secondary | ICD-10-CM | POA: Diagnosis present

## 2017-04-09 DIAGNOSIS — R509 Fever, unspecified: Secondary | ICD-10-CM

## 2017-04-09 DIAGNOSIS — Z9119 Patient's noncompliance with other medical treatment and regimen: Secondary | ICD-10-CM

## 2017-04-09 DIAGNOSIS — R627 Adult failure to thrive: Secondary | ICD-10-CM | POA: Diagnosis present

## 2017-04-09 DIAGNOSIS — L89152 Pressure ulcer of sacral region, stage 2: Secondary | ICD-10-CM | POA: Diagnosis present

## 2017-04-09 DIAGNOSIS — K210 Gastro-esophageal reflux disease with esophagitis: Principal | ICD-10-CM | POA: Diagnosis present

## 2017-04-09 DIAGNOSIS — Z66 Do not resuscitate: Secondary | ICD-10-CM

## 2017-04-09 DIAGNOSIS — G9349 Other encephalopathy: Secondary | ICD-10-CM | POA: Diagnosis present

## 2017-04-09 DIAGNOSIS — I959 Hypotension, unspecified: Secondary | ICD-10-CM | POA: Diagnosis present

## 2017-04-09 DIAGNOSIS — E876 Hypokalemia: Secondary | ICD-10-CM | POA: Diagnosis present

## 2017-04-09 DIAGNOSIS — R32 Unspecified urinary incontinence: Secondary | ICD-10-CM | POA: Diagnosis present

## 2017-04-09 DIAGNOSIS — E871 Hypo-osmolality and hyponatremia: Secondary | ICD-10-CM | POA: Diagnosis present

## 2017-04-09 DIAGNOSIS — J690 Pneumonitis due to inhalation of food and vomit: Secondary | ICD-10-CM | POA: Diagnosis present

## 2017-04-09 DIAGNOSIS — R112 Nausea with vomiting, unspecified: Secondary | ICD-10-CM

## 2017-04-09 DIAGNOSIS — R531 Weakness: Secondary | ICD-10-CM | POA: Insufficient documentation

## 2017-04-09 LAB — COMPREHENSIVE METABOLIC PANEL
ALT: 28 U/L (ref 17–63)
AST: 57 U/L — AB (ref 15–41)
Albumin: 2.4 g/dL — ABNORMAL LOW (ref 3.5–5.0)
Alkaline Phosphatase: 106 U/L (ref 38–126)
Anion gap: 16 — ABNORMAL HIGH (ref 5–15)
BUN: 5 mg/dL — AB (ref 6–20)
CHLORIDE: 83 mmol/L — AB (ref 101–111)
CO2: 30 mmol/L (ref 22–32)
Calcium: 7.8 mg/dL — ABNORMAL LOW (ref 8.9–10.3)
Creatinine, Ser: 0.66 mg/dL (ref 0.61–1.24)
Glucose, Bld: 98 mg/dL (ref 65–99)
POTASSIUM: 2.1 mmol/L — AB (ref 3.5–5.1)
Sodium: 129 mmol/L — ABNORMAL LOW (ref 135–145)
Total Bilirubin: 1.3 mg/dL — ABNORMAL HIGH (ref 0.3–1.2)
Total Protein: 6.3 g/dL — ABNORMAL LOW (ref 6.5–8.1)

## 2017-04-09 LAB — CBC WITH DIFFERENTIAL/PLATELET
BASOS ABS: 0 10*3/uL (ref 0.0–0.1)
BASOS PCT: 0 %
Eosinophils Absolute: 0.1 10*3/uL (ref 0.0–0.7)
Eosinophils Relative: 1 %
HEMATOCRIT: 24.1 % — AB (ref 39.0–52.0)
HEMOGLOBIN: 8.2 g/dL — AB (ref 13.0–17.0)
Lymphocytes Relative: 27 %
Lymphs Abs: 2 10*3/uL (ref 0.7–4.0)
MCH: 32.2 pg (ref 26.0–34.0)
MCHC: 34 g/dL (ref 30.0–36.0)
MCV: 94.5 fL (ref 78.0–100.0)
Monocytes Absolute: 0.6 10*3/uL (ref 0.1–1.0)
Monocytes Relative: 9 %
NEUTROS ABS: 4.7 10*3/uL (ref 1.7–7.7)
NEUTROS PCT: 63 %
Platelets: 258 10*3/uL (ref 150–400)
RBC: 2.55 MIL/uL — AB (ref 4.22–5.81)
RDW: 17.6 % — ABNORMAL HIGH (ref 11.5–15.5)
WBC: 7.4 10*3/uL (ref 4.0–10.5)

## 2017-04-09 LAB — I-STAT CHEM 8, ED
BUN: 4 mg/dL — AB (ref 6–20)
CALCIUM ION: 0.93 mmol/L — AB (ref 1.15–1.40)
CHLORIDE: 80 mmol/L — AB (ref 101–111)
Creatinine, Ser: 0.6 mg/dL — ABNORMAL LOW (ref 0.61–1.24)
GLUCOSE: 101 mg/dL — AB (ref 65–99)
HEMATOCRIT: 25 % — AB (ref 39.0–52.0)
Hemoglobin: 8.5 g/dL — ABNORMAL LOW (ref 13.0–17.0)
Potassium: 2.3 mmol/L — CL (ref 3.5–5.1)
Sodium: 128 mmol/L — ABNORMAL LOW (ref 135–145)
TCO2: 33 mmol/L — AB (ref 22–32)

## 2017-04-09 LAB — I-STAT CG4 LACTIC ACID, ED
LACTIC ACID, VENOUS: 2.01 mmol/L — AB (ref 0.5–1.9)
Lactic Acid, Venous: 3.39 mmol/L (ref 0.5–1.9)

## 2017-04-09 LAB — TROPONIN I

## 2017-04-09 LAB — PROTIME-INR
INR: 1.09
Prothrombin Time: 14 seconds (ref 11.4–15.2)

## 2017-04-09 LAB — ETHANOL

## 2017-04-09 LAB — LACTIC ACID, PLASMA
LACTIC ACID, VENOUS: 1.8 mmol/L (ref 0.5–1.9)
LACTIC ACID, VENOUS: 1.5 mmol/L (ref 0.5–1.9)

## 2017-04-09 LAB — MAGNESIUM: Magnesium: 1.1 mg/dL — ABNORMAL LOW (ref 1.7–2.4)

## 2017-04-09 LAB — CK: Total CK: 55 U/L (ref 49–397)

## 2017-04-09 LAB — ABO/RH: ABO/RH(D): A POS

## 2017-04-09 MED ORDER — OXYCODONE HCL 5 MG PO TABS
5.0000 mg | ORAL_TABLET | Freq: Once | ORAL | Status: AC
  Administered 2017-04-09: 5 mg via ORAL
  Filled 2017-04-09: qty 1

## 2017-04-09 MED ORDER — ENSURE ENLIVE PO LIQD
237.0000 mL | Freq: Two times a day (BID) | ORAL | Status: DC
  Administered 2017-04-11 – 2017-04-13 (×2): 237 mL via ORAL
  Filled 2017-04-09 (×2): qty 237

## 2017-04-09 MED ORDER — ADULT MULTIVITAMIN W/MINERALS CH
1.0000 | ORAL_TABLET | Freq: Every day | ORAL | Status: DC
  Administered 2017-04-09 – 2017-04-16 (×7): 1 via ORAL
  Filled 2017-04-09 (×7): qty 1

## 2017-04-09 MED ORDER — NICOTINE 21 MG/24HR TD PT24
21.0000 mg | MEDICATED_PATCH | Freq: Every day | TRANSDERMAL | Status: DC
  Administered 2017-04-09 – 2017-04-16 (×8): 21 mg via TRANSDERMAL
  Filled 2017-04-09 (×8): qty 1

## 2017-04-09 MED ORDER — ALBUTEROL SULFATE HFA 108 (90 BASE) MCG/ACT IN AERS: 2.0000 | INHALATION_SPRAY | RESPIRATORY_TRACT | Status: DC | PRN

## 2017-04-09 MED ORDER — POTASSIUM CHLORIDE CRYS ER 20 MEQ PO TBCR
40.0000 meq | EXTENDED_RELEASE_TABLET | Freq: Once | ORAL | Status: AC
  Administered 2017-04-09: 40 meq via ORAL
  Filled 2017-04-09: qty 2

## 2017-04-09 MED ORDER — METOPROLOL TARTRATE 12.5 MG HALF TABLET
12.5000 mg | ORAL_TABLET | Freq: Two times a day (BID) | ORAL | Status: DC
  Administered 2017-04-09 – 2017-04-16 (×11): 12.5 mg via ORAL
  Filled 2017-04-09 (×13): qty 1

## 2017-04-09 MED ORDER — FOLIC ACID 1 MG PO TABS
1.0000 mg | ORAL_TABLET | Freq: Every day | ORAL | Status: DC
  Administered 2017-04-09 – 2017-04-16 (×7): 1 mg via ORAL
  Filled 2017-04-09 (×7): qty 1

## 2017-04-09 MED ORDER — SODIUM CHLORIDE 0.9 % IV BOLUS (SEPSIS)
500.0000 mL | Freq: Once | INTRAVENOUS | Status: AC
  Administered 2017-04-09: 500 mL via INTRAVENOUS

## 2017-04-09 MED ORDER — ONDANSETRON HCL 4 MG PO TABS
4.0000 mg | ORAL_TABLET | Freq: Four times a day (QID) | ORAL | Status: DC | PRN
Start: 2017-04-09 — End: 2017-04-17
  Administered 2017-04-12: 4 mg via ORAL
  Filled 2017-04-09 (×2): qty 1

## 2017-04-09 MED ORDER — POTASSIUM CHLORIDE IN NACL 40-0.9 MEQ/L-% IV SOLN
INTRAVENOUS | Status: AC
  Administered 2017-04-09: 75 mL/h via INTRAVENOUS
  Filled 2017-04-09: qty 1000

## 2017-04-09 MED ORDER — POTASSIUM CHLORIDE 10 MEQ/100ML IV SOLN
10.0000 meq | Freq: Once | INTRAVENOUS | Status: AC
  Administered 2017-04-09: 10 meq via INTRAVENOUS
  Filled 2017-04-09: qty 100

## 2017-04-09 MED ORDER — MAGNESIUM SULFATE 2 GM/50ML IV SOLN
2.0000 g | Freq: Once | INTRAVENOUS | Status: AC
  Administered 2017-04-09: 2 g via INTRAVENOUS
  Filled 2017-04-09: qty 50

## 2017-04-09 MED ORDER — FERROUS SULFATE 325 (65 FE) MG PO TABS: 325.0000 mg | ORAL_TABLET | Freq: Three times a day (TID) | ORAL | Status: DC

## 2017-04-09 MED ORDER — THIAMINE HCL 100 MG PO TABS: 100.0000 mg | ORAL_TABLET | Freq: Every day | ORAL | Status: DC

## 2017-04-09 MED ORDER — POTASSIUM CHLORIDE 10 MEQ/100ML IV SOLN
10.0000 meq | INTRAVENOUS | Status: AC
  Administered 2017-04-09 (×3): 10 meq via INTRAVENOUS
  Filled 2017-04-09 (×3): qty 100

## 2017-04-09 MED ORDER — SODIUM CHLORIDE 0.9 % IV BOLUS (SEPSIS)
1000.0000 mL | Freq: Once | INTRAVENOUS | Status: AC
  Administered 2017-04-09: 1000 mL via INTRAVENOUS

## 2017-04-09 MED ORDER — ONDANSETRON HCL 4 MG/2ML IJ SOLN
4.0000 mg | Freq: Four times a day (QID) | INTRAMUSCULAR | Status: DC | PRN
  Administered 2017-04-11 – 2017-04-13 (×2): 4 mg via INTRAVENOUS
  Filled 2017-04-09 (×2): qty 2

## 2017-04-09 MED ORDER — PANTOPRAZOLE SODIUM 40 MG PO TBEC
40.0000 mg | DELAYED_RELEASE_TABLET | Freq: Two times a day (BID) | ORAL | Status: DC
  Administered 2017-04-09: 40 mg via ORAL
  Filled 2017-04-09: qty 1

## 2017-04-09 MED ORDER — MAGNESIUM OXIDE 400 (241.3 MG) MG PO TABS
400.0000 mg | ORAL_TABLET | Freq: Once | ORAL | Status: AC
Start: 2017-04-09 — End: 2017-04-09
  Administered 2017-04-09: 400 mg via ORAL
  Filled 2017-04-09 (×2): qty 1

## 2017-04-09 NOTE — ED Notes (Signed)
Delay in lab draw,  Pt using urinal at this time.

## 2017-04-09 NOTE — ED Notes (Signed)
Md aware of bp.

## 2017-04-09 NOTE — ED Triage Notes (Signed)
Per EMS 1 month of loss of ability to ambulate.  Pt has early stage liver and kidney disease. Pt is covered in stool.

## 2017-04-09 NOTE — ED Notes (Signed)
Abnormal Chem-8 and CG-4 reported to Dr. Regenia Skeeter

## 2017-04-09 NOTE — ED Notes (Signed)
MD paged about rapid heart rate.

## 2017-04-09 NOTE — H&P (Signed)
History and Physical    EPHRIAM TURMAN [name]:[id number] DOB: Sep 19, 1950 DOA: 04/09/2017  PCP: [name], MD  Patient coming from:  home  Chief Complaint:   weakness  HPI: Patrick [name] is a 66 y.o. male with medical history significant of chronic alcoholism, FTT for months comes in with generalized weakness and poor po intake.  He says he is still drinking daily but not as much as he use too.  Only one or two gins a day.  No fevers.  No n/v/d.  No bleeding.  No chest pain.  No sob. Pt sitting up in bed in ED eating a good meal.  No le edema or swelling.  Pt referred for admission for electrolyte abnormalites.  Review of Systems: As per HPI otherwise 10 point review of systems negative.   Past Medical History:  Diagnosis Date  . Colon polyps   . Depression   . Diverticulitis   . Diverticulosis of colon   . GERD (gastroesophageal reflux disease)   . Kidney stone     Past Surgical History:  Procedure Laterality Date  . COLONOSCOPY    . ESOPHAGOGASTRODUODENOSCOPY (EGD) WITH PROPOFOL N/A 12/21/2016   Procedure: ESOPHAGOGASTRODUODENOSCOPY (EGD) WITH PROPOFOL;  Surgeon: Doran Stabler, MD;  Location: [facility];  Service: Gastroenterology;  Laterality: N/A;  . UPPER GASTROINTESTINAL ENDOSCOPY       reports that he has been smoking cigarettes.  He has been smoking about 1.00 pack per day. he has never used smokeless tobacco. He reports that he drinks about 8.4 oz of alcohol per week. He reports that he does not use drugs.  No Known Allergies  Family History  Problem Relation Age of Onset  . Heart disease Father 60       cabg    Prior to Admission medications   Medication Sig Start Date End Date Taking? Authorizing Provider  pantoprazole (PROTONIX) 40 MG tablet Take 1 tablet (40 mg total) by mouth 2 (two) times daily. 12/01/16  Yes Short, Noah Delaine, MD  albuterol (VENTOLIN HFA) 108 (90 BASE) MCG/ACT inhaler Inhale 2 puffs into the lungs every 4 (four) hours as needed  for wheezing or shortness of breath. Patient not taking: Reported on 04/09/2017 08/17/14   [name], MD  dibucaine (NUPERCAINAL) 1 % OINT Place 1 application rectally as needed (rectal pain). Patient not taking: Reported on 04/09/2017 01/08/17   Donne Hazel, MD  feeding supplement, ENSURE ENLIVE, (ENSURE ENLIVE) LIQD Take 237 mLs by mouth 2 (two) times daily between meals. Patient not taking: Reported on 01/04/2017 12/01/16   Janece Canterbury, MD  ferrous sulfate 325 (65 FE) MG tablet Take 1 tablet (325 mg total) by mouth 3 (three) times daily with meals. Patient not taking: Reported on 04/09/2017 12/01/16   Janece Canterbury, MD  folic acid (FOLVITE) 1 MG tablet Take 1 tablet (1 mg total) by mouth daily. Patient not taking: Reported on 04/09/2017 12/02/16   Janece Canterbury, MD  Hydrocortisone (GERHARDT'S BUTT CREAM) CREA Apply 1 application topically 2 (two) times daily. Patient not taking: Reported on 04/09/2017 12/01/16   Janece Canterbury, MD  Maltodextrin-Xanthan Gum ([place]) POWD Add to liquids to make nectar thick. Patient not taking: Reported on 04/09/2017 12/01/16   Janece Canterbury, MD  metoprolol tartrate (LOPRESSOR) 25 MG tablet Take 0.5 tablets (12.5 mg total) by mouth 2 (two) times daily. Patient not taking: Reported on 04/09/2017 01/08/17   Donne Hazel, MD  nicotine (NICODERM CQ - DOSED IN  MG/24 HOURS) 21 mg/24hr patch Place 1 patch (21 mg total) onto the skin daily. 12/02/16   Janece Canterbury, MD  polyethylene glycol powder (GLYCOLAX/MIRALAX) powder Take 17 grams in 8 oz of water or juice daily. Patient not taking: Reported on 04/09/2017 02/23/15   Zehr, Laban Emperor, PA-C  thiamine 100 MG tablet Take 1 tablet (100 mg total) by mouth daily. Patient not taking: Reported on 04/09/2017 12/02/16   Janece Canterbury, MD    Physical Exam: Vitals:   04/09/17 1815 04/09/17 1830 04/09/17 1845 04/09/17 1900  BP: 99/71 (!) 89/63 95/69 111/83  Pulse: (!) 110 (!) 106  (!) 105 (!) 110  Resp: 17 14 16 19   Temp:      TempSrc:      SpO2: 97% 98% 100% 100%      Constitutional: NAD, calm, comfortable, cachectic Vitals:   04/09/17 1815 04/09/17 1830 04/09/17 1845 04/09/17 1900  BP: 99/71 (!) 89/63 95/69 111/83  Pulse: (!) 110 (!) 106 (!) 105 (!) 110  Resp: 17 14 16 19   Temp:      TempSrc:      SpO2: 97% 98% 100% 100%   Eyes: PERRL, lids and conjunctivae normal ENMT: Mucous membranes are moist. Posterior pharynx clear of any exudate or lesions.Normal dentition.  Neck: normal, supple, no masses, no thyromegaly Respiratory: clear to auscultation bilaterally, no wheezing, no crackles. Normal respiratory effort. No accessory muscle use.  Cardiovascular: Regular rate and rhythm, no murmurs / rubs / gallops. No extremity edema. 2+ pedal pulses. No carotid bruits.  Abdomen: no tenderness, no masses palpated. No hepatosplenomegaly. Bowel sounds positive.  Musculoskeletal: no clubbing / cyanosis. No joint deformity upper and lower extremities. Good ROM, no contractures. Normal muscle tone.  Skin: no rashes, lesions, ulcers. No induration Neurologic: CN 2-12 grossly intact. Sensation intact, DTR normal. Strength 5/5 in all 4.  Psychiatric: Normal judgment and insight. Alert and oriented x 3. Normal mood.    Labs on Admission: I have personally reviewed following labs and imaging studies  CBC: Recent Labs  Lab 04/09/17 1620 04/09/17 1659  WBC 7.4  --   NEUTROABS 4.7  --   HGB 8.2* 8.5*  HCT 24.1* 25.0*  MCV 94.5  --   PLT 258  --    Basic Metabolic Panel: Recent Labs  Lab 04/09/17 1620 04/09/17 1659  NA 129* 128*  K 2.1* 2.3*  CL 83* 80*  CO2 30  --   GLUCOSE 98 101*  BUN 5* 4*  CREATININE 0.66 0.60*  CALCIUM 7.8*  --   MG 1.1*  --    GFR: CrCl cannot be calculated (Unknown ideal weight.). Liver Function Tests: Recent Labs  Lab 04/09/17 1620  AST 57*  ALT 28  ALKPHOS 106  BILITOT 1.3*  PROT 6.3*  ALBUMIN 2.4*   No results for  input(s): LIPASE, AMYLASE in the last 168 hours. No results for input(s): AMMONIA in the last 168 hours. Coagulation Profile: Recent Labs  Lab 04/09/17 1620  INR 1.09   Cardiac Enzymes: Recent Labs  Lab 04/09/17 1620  CKTOTAL 55  TROPONINI <0.03   BNP (last 3 results) No results for input(s): PROBNP in the last 8760 hours. HbA1C: No results for input(s): HGBA1C in the last 72 hours. CBG: No results for input(s): GLUCAP in the last 168 hours. Lipid Profile: No results for input(s): CHOL, HDL, LDLCALC, TRIG, CHOLHDL, LDLDIRECT in the last 72 hours. Thyroid Function Tests: No results for input(s): TSH, T4TOTAL, FREET4, T3FREE, THYROIDAB in the  last 72 hours. Anemia Panel: No results for input(s): VITAMINB12, FOLATE, FERRITIN, TIBC, IRON, RETICCTPCT in the last 72 hours. Urine analysis:    Component Value Date/Time   COLORURINE YELLOW 12/20/2016 1251   APPEARANCEUR HAZY (A) 12/20/2016 1251   LABSPEC 1.013 12/20/2016 1251   PHURINE 5.0 12/20/2016 1251   GLUCOSEU 50 (A) 12/20/2016 1251   HGBUR SMALL (A) 12/20/2016 1251   HGBUR negative 02/21/2010 1055   BILIRUBINUR NEGATIVE 12/20/2016 1251   BILIRUBINUR n 03/11/2014 1025   KETONESUR 20 (A) 12/20/2016 1251   PROTEINUR NEGATIVE 12/20/2016 1251   UROBILINOGEN 0.2 03/11/2014 1025   UROBILINOGEN 0.2 02/21/2010 1055   NITRITE NEGATIVE 12/20/2016 1251   LEUKOCYTESUR NEGATIVE 12/20/2016 1251   Sepsis Labs: !!!!!!!!!!!!!!!!!!!!!!!!!!!!!!!!!!!!!!!!!!!! @LABRCNTIP (procalcitonin:4,lacticidven:4) )No results found for this or any previous visit (from the past 240 hour(s)).   Radiological Exams on Admission: Dg Chest 2 View  Result Date: 04/09/2017 CLINICAL DATA:  Chronic cough and failure to thrive. EXAM: CHEST  2 VIEW COMPARISON:  12/20/2016 FINDINGS: The heart size and mediastinal contours are within normal limits. Minimal aortic atherosclerosis at the arch without aneurysm. Moderate aortic atherosclerosis, stable in appearance.  Emphysematous hyperinflation of the lungs without pneumonic consolidation or CHF. Osteopenic appearance of the thoracic spine. No acute osseous abnormality. IMPRESSION: Hyperinflated lungs consistent with COPD. Electronically Signed   By: Ashley Royalty M.D.   On: 04/09/2017 18:14    EKG: Independently reviewed. Sinus tachy Old chart reviewed Case discussed with edp  Assessment/Plan 66 yo male with generalized weakness, FTT found to have k level of 2.2, mag 1.1  Principal Problem:   Hypokalemia- given 40 po and 82meq iv kcl ordered.  Also in ivf overnight.  Replete mag.  obs on tele monitoring.  Active Problems:   Pressure ulcer of sacral region, stage 2- noted, wound care   Alcohol abuse- noted   Hypomagnesemia- mag sulfate 2 gam iv given, repeat in am   FTT (failure to thrive) in adult-noted   Does not take any meds, resume bblocker, dialy vitamins, folic acid, thiamine.  Obtain PT eval, may benefit from [facility] and PT at home, has nothing at home set up   DVT prophylaxis:  scds Code Status:  full Family Communication: none Disposition Plan:  Per day team Consults called:  none Admission status:  observation   Judithann Villamar A MD [facility]  If 7PM-7AM, please contact night-coverage [email or web address] Password [facility]  04/09/2017, 7:34 PM

## 2017-04-09 NOTE — ED Notes (Signed)
Pharmacy sent note about magnesium tablet

## 2017-04-09 NOTE — ED Provider Notes (Signed)
[facility] EMERGENCY DEPARTMENT Provider Note   CSN: [id number] Arrival date & time: 04/09/17  1516     History   Chief Complaint No chief complaint on file.   HPI Patrick [name] is a 66 y.o. male.  HPI  66 year old male brought in by EMS for weakness.  He states he has been unable to ambulate or stand on his own for over 1 month.  He lives at home with his son who somewhat takes care of him.  The patient states he has not gotten out of bed during this time.  He is eating and drinking little although he does state he drinks about one gin and tonic per day.  He denies any fevers or specific pain except for what he describes as ulcers on his buttocks that have been there for about 1 month.  These areas are painful but he does not know if they are draining.  He arrives covered in stool.  He states that his legs cannot support him and whenever he tries to stand up he will fall down.  Denies any vomiting.  He states he has been having urinary and fecal incontinence for a couple months.  Has lost about 25 pounds over the last 2 months.  He has had a cough for 1 month but denies chest pain or shortness of breath.  When asked why he waited until now to come into the ED, he states he has been trying to wait for insurance before coming to the hospital.  Past Medical History:  Diagnosis Date  . Colon polyps   . Depression   . Diverticulitis   . Diverticulosis of colon   . GERD (gastroesophageal reflux disease)   . Kidney stone     Patient Active Problem List   Diagnosis Date Noted  . FTT (failure to thrive) in adult 04/09/2017  . Generalized weakness   . Alcohol withdrawal ([place]) 01/04/2017  . Esophagitis 01/04/2017  . Decubitus ulcer of buttock, stage 1   . Upper GI bleed 12/20/2016  . AKI (acute kidney injury) ([place]) 12/20/2016  . 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 Encounter for palliative care   . Goals of care, counseling/discussion   .  Dysphagia 11/30/2016  . Symptomatic anemia 11/25/2016  . Pressure ulcer of sacral region, stage 2 11/25/2016  . Alcohol abuse 11/25/2016  . Hypotension 11/25/2016  . Hypomagnesemia 11/25/2016  . Hyponatremia 11/25/2016  . Non-traumatic rhabdomyolysis 11/25/2016  . Erosive esophagitis 05/14/2015  . Hypokalemia 04/30/2015  . Thrombocytopenia ([place]) 04/16/2015  . Alcohol dependence ([place]) 03/17/2015  . Change in bowel habits 02/23/2015  . Rectal bleeding 02/23/2015  . Constipation 02/23/2015  . Transaminasemia 03/27/2013  . Essential tremor 03/27/2013  . Hypertension 07/19/2012  . TOBACCO USE 03/01/2010  . DIVERTICULOSIS, COLON 05/14/2007  . DEPRESSION 11/05/2006  . COLONIC POLYPS, HX OF 11/05/2006    Past Surgical History:  Procedure Laterality Date  . COLONOSCOPY    . ESOPHAGOGASTRODUODENOSCOPY (EGD) WITH PROPOFOL N/A 12/21/2016   Procedure: ESOPHAGOGASTRODUODENOSCOPY (EGD) WITH PROPOFOL;  Surgeon: Doran Stabler, MD;  Location: [facility];  Service: Gastroenterology;  Laterality: N/A;  . UPPER GASTROINTESTINAL ENDOSCOPY         Home Medications    Prior to Admission medications   Medication Sig Start Date End Date Taking? Authorizing Provider  pantoprazole (PROTONIX) 40 MG tablet Take 1 tablet (40 mg total) by mouth 2 (two) times daily. 12/01/16  Yes Janece Canterbury, MD  albuterol (VENTOLIN  HFA) 108 (90 BASE) MCG/ACT inhaler Inhale 2 puffs into the lungs every 4 (four) hours as needed for wheezing or shortness of breath. Patient not taking: Reported on 04/09/2017 08/17/14   [name], MD  dibucaine (NUPERCAINAL) 1 % OINT Place 1 application rectally as needed (rectal pain). Patient not taking: Reported on 04/09/2017 01/08/17   Donne Hazel, MD  feeding supplement, ENSURE ENLIVE, (ENSURE ENLIVE) LIQD Take 237 mLs by mouth 2 (two) times daily between meals. Patient not taking: Reported on 01/04/2017 12/01/16   Janece Canterbury, MD  ferrous sulfate 325 (65 FE) MG  tablet Take 1 tablet (325 mg total) by mouth 3 (three) times daily with meals. Patient not taking: Reported on 04/09/2017 12/01/16   Janece Canterbury, MD  folic acid (FOLVITE) 1 MG tablet Take 1 tablet (1 mg total) by mouth daily. Patient not taking: Reported on 04/09/2017 12/02/16   Janece Canterbury, MD  Hydrocortisone (GERHARDT'S BUTT CREAM) CREA Apply 1 application topically 2 (two) times daily. Patient not taking: Reported on 04/09/2017 12/01/16   Janece Canterbury, MD  Maltodextrin-Xanthan Gum ([place]) POWD Add to liquids to make nectar thick. Patient not taking: Reported on 04/09/2017 12/01/16   Janece Canterbury, MD  metoprolol tartrate (LOPRESSOR) 25 MG tablet Take 0.5 tablets (12.5 mg total) by mouth 2 (two) times daily. Patient not taking: Reported on 04/09/2017 01/08/17   Donne Hazel, MD  nicotine (NICODERM CQ - DOSED IN MG/24 HOURS) 21 mg/24hr patch Place 1 patch (21 mg total) onto the skin daily. 12/02/16   Janece Canterbury, MD  polyethylene glycol powder (GLYCOLAX/MIRALAX) powder Take 17 grams in 8 oz of water or juice daily. Patient not taking: Reported on 04/09/2017 02/23/15   Zehr, Laban Emperor, PA-C  thiamine 100 MG tablet Take 1 tablet (100 mg total) by mouth daily. Patient not taking: Reported on 04/09/2017 12/02/16   Janece Canterbury, MD    Family History Family History  Problem Relation Age of Onset  . Heart disease Father 43       cabg    Social History Social History   Tobacco Use  . Smoking status: Current Every Day Smoker    Packs/day: 1.00    Types: Cigarettes  . Smokeless tobacco: Never Used  Substance Use Topics  . Alcohol use: Yes    Alcohol/week: 8.4 oz    Types: 14 Glasses of wine per week  . Drug use: No     Allergies   Patient has no known allergies.   Review of Systems Review of Systems  Constitutional: Negative for fever.  Respiratory: Positive for cough. Negative for shortness of breath.   Cardiovascular: Negative for  chest pain.  Gastrointestinal: Negative for abdominal pain and vomiting.  Genitourinary:       Incontinence  Musculoskeletal: Negative for back pain.  Neurological: Positive for weakness and numbness (bilateral plantar feet). Negative for headaches.  All other systems reviewed and are negative.    Physical Exam Updated Vital Signs BP 106/75   Pulse (!) 108   Temp 98.8 F (37.1 C) (Oral)   Resp 16   SpO2 100%   Physical Exam  Constitutional: He is oriented to person, place, and time. He appears well-developed. He appears cachectic.  Low back covered in stool. Stool under his fingernails. Overall appears disheveled  HENT:  Head: Normocephalic and atraumatic.  Right Ear: External ear normal.  Left Ear: External ear normal.  Nose: Nose normal.  Eyes: EOM are normal. Pupils are equal, round,  and reactive to light. Right eye exhibits no discharge. Left eye exhibits no discharge.  Neck: Neck supple.  Cardiovascular: Regular rhythm and normal heart sounds. Tachycardia present.  Pulmonary/Chest: Effort normal and breath sounds normal.  Abdominal: Soft. He exhibits no distension. There is no tenderness.  Musculoskeletal: He exhibits no edema.       Cervical back: He exhibits no tenderness.       Thoracic back: He exhibits no tenderness.       Lumbar back: He exhibits no tenderness.  Neurological: He is alert and oriented to person, place, and time.  Reflex Scores:      Patellar reflexes are 2+ on the right side and 2+ on the left side.      Achilles reflexes are 2+ on the right side and 2+ on the left side. CN 3-12 grossly intact. 5/5 strength in all 4 extremities. LLE slightly weaker than right but 5/5. Grossly normal sensation. Normal finger to nose.   Skin: Skin is warm and dry. There is erythema.     Nursing note and vitals reviewed.    ED Treatments / Results  Labs (all labs ordered are listed, but only abnormal results are displayed) Labs Reviewed  COMPREHENSIVE  METABOLIC PANEL - Abnormal; Notable for the following components:      Result Value   Sodium 129 (*)    Potassium 2.1 (*)    Chloride 83 (*)    BUN 5 (*)    Calcium 7.8 (*)    Total Protein 6.3 (*)    Albumin 2.4 (*)    AST 57 (*)    Total Bilirubin 1.3 (*)    Anion gap 16 (*)    All other components within normal limits  CBC WITH DIFFERENTIAL/PLATELET - Abnormal; Notable for the following components:   RBC 2.55 (*)    Hemoglobin 8.2 (*)    HCT 24.1 (*)    RDW 17.6 (*)    All other components within normal limits  MAGNESIUM - Abnormal; Notable for the following components:   Magnesium 1.1 (*)    All other components within normal limits  I-STAT CHEM 8, ED - Abnormal; Notable for the following components:   Sodium 128 (*)    Potassium 2.3 (*)    Chloride 80 (*)    BUN 4 (*)    Creatinine, Ser 0.60 (*)    Glucose, Bld 101 (*)    Calcium, Ion 0.93 (*)    TCO2 33 (*)    Hemoglobin 8.5 (*)    HCT 25.0 (*)    All other components within normal limits  I-STAT CG4 LACTIC ACID, ED - Abnormal; Notable for the following components:   Lactic Acid, Venous 3.39 (*)    All other components within normal limits  I-STAT CG4 LACTIC ACID, ED - Abnormal; Notable for the following components:   Lactic Acid, Venous 2.01 (*)    All other components within normal limits  ETHANOL  TROPONIN I  PROTIME-INR  CK  LACTIC ACID, PLASMA  LACTIC ACID, PLASMA  URINALYSIS, ROUTINE W REFLEX MICROSCOPIC  RAPID URINE DRUG SCREEN, HOSP PERFORMED  MAGNESIUM  BASIC METABOLIC PANEL  CBC  TYPE AND SCREEN  ABO/RH    EKG  EKG Interpretation  Date/Time:  Monday April 09 2017 17:21:17 EST Ventricular Rate:  117 PR Interval:    QRS Duration: 88 QT Interval:  261 QTC Calculation: 364 R Axis:   86 Text Interpretation:  Sinus tachycardia Ventricular premature complex Borderline right axis  deviation Borderline repolarization abnormality Poor data quality Confirmed by Sherwood Gambler [phone]) on  04/09/2017 5:56:45 PM       Radiology Dg Chest 2 View  Result Date: 04/09/2017 CLINICAL DATA:  Chronic cough and failure to thrive. EXAM: CHEST  2 VIEW COMPARISON:  12/20/2016 FINDINGS: The heart size and mediastinal contours are within normal limits. Minimal aortic atherosclerosis at the arch without aneurysm. Moderate aortic atherosclerosis, stable in appearance. Emphysematous hyperinflation of the lungs without pneumonic consolidation or CHF. Osteopenic appearance of the thoracic spine. No acute osseous abnormality. IMPRESSION: Hyperinflated lungs consistent with COPD. Electronically Signed   By: Ashley Royalty M.D.   On: 04/09/2017 18:14    Procedures Procedures (including critical care time)  Medications Ordered in ED Medications  potassium chloride 10 mEq in 100 mL IVPB (0 mEq Intravenous Stopped 04/10/17 0017)  nicotine (NICODERM CQ - dosed in mg/24 hours) patch 21 mg (21 mg Transdermal Patch Applied 04/09/17 2210)  pantoprazole (PROTONIX) EC tablet 40 mg (40 mg Oral Given 04/09/17 2206)  metoprolol tartrate (LOPRESSOR) tablet 12.5 mg (12.5 mg Oral Given 54/62/70 3500)  folic acid (FOLVITE) tablet 1 mg (1 mg Oral Given 04/09/17 2317)  feeding supplement (ENSURE ENLIVE) (ENSURE ENLIVE) liquid 237 mL (not administered)  albuterol (PROVENTIL HFA;VENTOLIN HFA) 108 (90 Base) MCG/ACT inhaler 2 puff (not administered)  0.9 % NaCl with KCl 40 mEq / L  infusion (75 mL/hr Intravenous New Bag/Given 04/09/17 2204)  ondansetron (ZOFRAN) tablet 4 mg (not administered)    Or  ondansetron (ZOFRAN) injection 4 mg (not administered)  multivitamin with minerals tablet 1 tablet (1 tablet Oral Given 04/09/17 2317)  ibuprofen (ADVIL,MOTRIN) tablet 800 mg (not administered)  sodium chloride 0.9 % bolus 1,000 mL (0 mLs Intravenous Stopped 04/09/17 2203)  oxyCODONE (Oxy IR/ROXICODONE) immediate release tablet 5 mg (5 mg Oral Given 04/09/17 1719)  potassium chloride SA (K-DUR,KLOR-CON) CR tablet 40 mEq (40  mEq Oral Given 04/09/17 1719)  potassium chloride 10 mEq in 100 mL IVPB (0 mEq Intravenous Stopped 04/09/17 1819)  magnesium oxide (MAG-OX) tablet 400 mg (400 mg Oral Given 04/09/17 1858)  magnesium sulfate IVPB 2 g 50 mL (0 g Intravenous Stopped 04/09/17 2031)  sodium chloride 0.9 % bolus 500 mL (0 mLs Intravenous Stopped 04/09/17 2025)     Initial Impression / Assessment and Plan / ED Course  I have reviewed the triage vital signs and the nursing notes.  Pertinent labs & imaging results that were available during my care of the patient were reviewed by me and considered in my medical decision making (see chart for details).     Patient's generalized weakness is likely from multiple metabolic abnormalities such as hypokalemia, hypomagnesemia, and mild hyponatremia.  He was given IV fluids for what appears to be dehydration.  He was started on oral and IV potassium and magnesium replacement.  Likely the cause is poor intake.  Chest x-ray benign.  He does have an elevated lactic acid but with normal white blood cell count, no fever, and no clear infectious source I think this is likely dehydration related.  He does have what appears to be a pressure wounds.  Dr. Shanon Brow to admit.  Final Clinical Impressions(s) / ED Diagnoses   Final diagnoses:  Generalized weakness  Hypokalemia  Hypomagnesemia  Hypocalcemia  Hyponatremia    ED Discharge Orders    None       Sherwood Gambler, MD 04/10/17 [phone]

## 2017-04-10 ENCOUNTER — Inpatient Hospital Stay (HOSPITAL_COMMUNITY): Payer: Self-pay

## 2017-04-10 DIAGNOSIS — K922 Gastrointestinal hemorrhage, unspecified: Secondary | ICD-10-CM | POA: Diagnosis present

## 2017-04-10 LAB — BASIC METABOLIC PANEL
ANION GAP: 4 — AB (ref 5–15)
BUN: 11 mg/dL (ref 6–20)
CALCIUM: 6.7 mg/dL — AB (ref 8.9–10.3)
CO2: 26 mmol/L (ref 22–32)
Chloride: 103 mmol/L (ref 101–111)
Creatinine, Ser: 0.71 mg/dL (ref 0.61–1.24)
Glucose, Bld: 123 mg/dL — ABNORMAL HIGH (ref 65–99)
Potassium: 3.4 mmol/L — ABNORMAL LOW (ref 3.5–5.1)
SODIUM: 133 mmol/L — AB (ref 135–145)

## 2017-04-10 LAB — CBC
HCT: 15.2 % — ABNORMAL LOW (ref 39.0–52.0)
HEMOGLOBIN: 5 g/dL — AB (ref 13.0–17.0)
MCH: 31.8 pg (ref 26.0–34.0)
MCHC: 32.9 g/dL (ref 30.0–36.0)
MCV: 96.8 fL (ref 78.0–100.0)
PLATELETS: 169 10*3/uL (ref 150–400)
RBC: 1.57 MIL/uL — AB (ref 4.22–5.81)
RDW: 18.5 % — ABNORMAL HIGH (ref 11.5–15.5)
WBC: 5.6 10*3/uL (ref 4.0–10.5)

## 2017-04-10 LAB — RAPID URINE DRUG SCREEN, HOSP PERFORMED
Amphetamines: NOT DETECTED
Barbiturates: NOT DETECTED
Benzodiazepines: NOT DETECTED
Cocaine: NOT DETECTED
OPIATES: NOT DETECTED
Tetrahydrocannabinol: NOT DETECTED

## 2017-04-10 LAB — URINALYSIS, ROUTINE W REFLEX MICROSCOPIC
BILIRUBIN URINE: NEGATIVE
Glucose, UA: NEGATIVE mg/dL
HGB URINE DIPSTICK: NEGATIVE
KETONES UR: NEGATIVE mg/dL
Leukocytes, UA: NEGATIVE
Nitrite: NEGATIVE
PROTEIN: NEGATIVE mg/dL
Specific Gravity, Urine: 1.015 (ref 1.005–1.030)
pH: 6 (ref 5.0–8.0)

## 2017-04-10 LAB — AMMONIA: AMMONIA: 18 umol/L (ref 9–35)

## 2017-04-10 LAB — PREPARE RBC (CROSSMATCH)

## 2017-04-10 LAB — TROPONIN I

## 2017-04-10 LAB — PROCALCITONIN: Procalcitonin: 0.42 ng/mL

## 2017-04-10 LAB — MRSA PCR SCREENING: MRSA BY PCR: NEGATIVE

## 2017-04-10 LAB — HEMOGLOBIN AND HEMATOCRIT, BLOOD
HEMATOCRIT: 27.1 % — AB (ref 39.0–52.0)
HEMOGLOBIN: 9.2 g/dL — AB (ref 13.0–17.0)

## 2017-04-10 LAB — POC OCCULT BLOOD, ED: Fecal Occult Bld: POSITIVE — AB

## 2017-04-10 LAB — LACTIC ACID, PLASMA
Lactic Acid, Venous: 1.1 mmol/L (ref 0.5–1.9)
Lactic Acid, Venous: 1.2 mmol/L (ref 0.5–1.9)

## 2017-04-10 LAB — CORTISOL: CORTISOL PLASMA: 8.8 ug/dL

## 2017-04-10 LAB — MAGNESIUM: MAGNESIUM: 1.5 mg/dL — AB (ref 1.7–2.4)

## 2017-04-10 MED ORDER — ADULT MULTIVITAMIN W/MINERALS CH: 1.0000 | ORAL_TABLET | Freq: Every day | ORAL | Status: DC

## 2017-04-10 MED ORDER — IBUPROFEN 800 MG PO TABS
800.0000 mg | ORAL_TABLET | Freq: Once | ORAL | Status: AC
  Administered 2017-04-10: 800 mg via ORAL
  Filled 2017-04-10: qty 1

## 2017-04-10 MED ORDER — ORAL CARE MOUTH RINSE
15.0000 mL | Freq: Two times a day (BID) | OROMUCOSAL | Status: DC
  Administered 2017-04-11 (×2): 15 mL via OROMUCOSAL

## 2017-04-10 MED ORDER — PANTOPRAZOLE SODIUM 40 MG IV SOLR: 40.0000 mg | Freq: Two times a day (BID) | INTRAVENOUS | Status: DC

## 2017-04-10 MED ORDER — LORAZEPAM 2 MG/ML IJ SOLN: 0.0000 mg | Freq: Two times a day (BID) | INTRAMUSCULAR | Status: DC

## 2017-04-10 MED ORDER — MAGNESIUM SULFATE 2 GM/50ML IV SOLN
2.0000 g | Freq: Once | INTRAVENOUS | Status: AC
  Administered 2017-04-10: 2 g via INTRAVENOUS
  Filled 2017-04-10: qty 50

## 2017-04-10 MED ORDER — IBUPROFEN 800 MG PO TABS
800.0000 mg | ORAL_TABLET | Freq: Once | ORAL | Status: DC
Start: 2017-04-10 — End: 2017-04-10

## 2017-04-10 MED ORDER — SODIUM CHLORIDE 0.9 % IV SOLN
80.0000 mg | Freq: Once | INTRAVENOUS | Status: AC
  Administered 2017-04-10: 80 mg via INTRAVENOUS
  Filled 2017-04-10: qty 80

## 2017-04-10 MED ORDER — FOLIC ACID 1 MG PO TABS: 1.0000 mg | ORAL_TABLET | Freq: Every day | ORAL | Status: DC

## 2017-04-10 MED ORDER — PIPERACILLIN-TAZOBACTAM 3.375 G IVPB 30 MIN
3.3750 g | Freq: Four times a day (QID) | INTRAVENOUS | Status: AC
  Administered 2017-04-10 – 2017-04-13 (×11): 3.375 g via INTRAVENOUS
  Filled 2017-04-10 (×11): qty 50

## 2017-04-10 MED ORDER — NOREPINEPHRINE BITARTRATE 1 MG/ML IV SOLN
5.0000 ug/min | INTRAVENOUS | Status: DC
  Administered 2017-04-10: 1 ug/min via INTRAVENOUS
  Filled 2017-04-10: qty 4

## 2017-04-10 MED ORDER — POTASSIUM CHLORIDE 10 MEQ/100ML IV SOLN
10.0000 meq | Freq: Once | INTRAVENOUS | Status: AC
  Administered 2017-04-10: 10 meq via INTRAVENOUS
  Filled 2017-04-10: qty 100

## 2017-04-10 MED ORDER — SODIUM CHLORIDE 0.9 % IV BOLUS (SEPSIS)
1000.0000 mL | Freq: Once | INTRAVENOUS | Status: AC
  Administered 2017-04-10: 1000 mL via INTRAVENOUS

## 2017-04-10 MED ORDER — HYDROCODONE-ACETAMINOPHEN 5-325 MG PO TABS
1.0000 | ORAL_TABLET | Freq: Four times a day (QID) | ORAL | Status: DC | PRN
  Administered 2017-04-10 – 2017-04-16 (×8): 2 via ORAL
  Filled 2017-04-10 (×9): qty 2

## 2017-04-10 MED ORDER — SODIUM CHLORIDE 0.9 % IV SOLN
8.0000 mg/h | INTRAVENOUS | Status: DC
  Administered 2017-04-10 – 2017-04-11 (×4): 8 mg/h via INTRAVENOUS
  Filled 2017-04-10 (×6): qty 80

## 2017-04-10 MED ORDER — DEXTROSE 5 % IV SOLN
500.0000 mg | INTRAVENOUS | Status: DC
  Administered 2017-04-10: 500 mg via INTRAVENOUS
  Filled 2017-04-10 (×2): qty 500

## 2017-04-10 MED ORDER — SODIUM CHLORIDE 0.9 % IV BOLUS (SEPSIS)
500.0000 mL | Freq: Once | INTRAVENOUS | Status: AC
  Administered 2017-04-10: 500 mL via INTRAVENOUS

## 2017-04-10 MED ORDER — SODIUM CHLORIDE 0.9% FLUSH
10.0000 mL | Freq: Two times a day (BID) | INTRAVENOUS | Status: DC
  Administered 2017-04-11 – 2017-04-12 (×2): 10 mL

## 2017-04-10 MED ORDER — LORAZEPAM 2 MG/ML IJ SOLN: 1.0000 mg | Freq: Four times a day (QID) | INTRAMUSCULAR | Status: AC | PRN

## 2017-04-10 MED ORDER — VITAMIN B-1 100 MG PO TABS
100.0000 mg | ORAL_TABLET | Freq: Every day | ORAL | Status: DC
  Administered 2017-04-11 – 2017-04-16 (×6): 100 mg via ORAL
  Filled 2017-04-10 (×6): qty 1

## 2017-04-10 MED ORDER — LORAZEPAM 1 MG PO TABS
1.0000 mg | ORAL_TABLET | Freq: Four times a day (QID) | ORAL | Status: AC | PRN
  Administered 2017-04-12: 1 mg via ORAL
  Filled 2017-04-10: qty 1

## 2017-04-10 MED ORDER — ALBUTEROL SULFATE (2.5 MG/3ML) 0.083% IN NEBU
2.5000 mg | INHALATION_SOLUTION | RESPIRATORY_TRACT | Status: DC | PRN
  Administered 2017-04-10 (×2): 2.5 mg via RESPIRATORY_TRACT
  Filled 2017-04-10 (×3): qty 3

## 2017-04-10 MED ORDER — SODIUM CHLORIDE 0.9 % IV SOLN
Freq: Once | INTRAVENOUS | Status: AC
  Administered 2017-04-10: 09:00:00 via INTRAVENOUS

## 2017-04-10 MED ORDER — THIAMINE HCL 100 MG/ML IJ SOLN: 100.0000 mg | Freq: Every day | INTRAMUSCULAR | Status: DC

## 2017-04-10 MED ORDER — LIDOCAINE HCL (PF) 1 % IJ SOLN
INTRAMUSCULAR | Status: AC
  Administered 2017-04-10: 5 mL
  Filled 2017-04-10: qty 5

## 2017-04-10 MED ORDER — SODIUM CHLORIDE 0.9% FLUSH
10.0000 mL | INTRAVENOUS | Status: DC | PRN
  Administered 2017-04-11: 10 mL
  Filled 2017-04-10: qty 40

## 2017-04-10 MED ORDER — CHLORHEXIDINE GLUCONATE 0.12 % MT SOLN
15.0000 mL | Freq: Two times a day (BID) | OROMUCOSAL | Status: DC
  Administered 2017-04-10 – 2017-04-13 (×5): 15 mL via OROMUCOSAL
  Filled 2017-04-10 (×8): qty 15

## 2017-04-10 MED ORDER — CHLORHEXIDINE GLUCONATE CLOTH 2 % EX PADS
6.0000 | MEDICATED_PAD | Freq: Every day | CUTANEOUS | Status: DC
  Administered 2017-04-11 – 2017-04-16 (×2): 6 via TOPICAL

## 2017-04-10 MED ORDER — LORAZEPAM 2 MG/ML IJ SOLN
0.0000 mg | Freq: Four times a day (QID) | INTRAMUSCULAR | Status: DC
  Administered 2017-04-10: 2 mg via INTRAVENOUS
  Filled 2017-04-10: qty 1

## 2017-04-10 NOTE — ED Notes (Signed)
  Admitting MD paged.  Patrick [name] TRAA Hgb 5.0 Patient BP 76/59 at this time. patient HR 105. Patient alert to self is now not oriented to time and place.

## 2017-04-10 NOTE — ED Notes (Signed)
GI MD at bedside

## 2017-04-10 NOTE — Procedures (Signed)
Central venous catheter placement.  A large bore catheter was placed for rapid volume resuscitation if needed.  Patient is not competent and no surrogate is immediately available therefore the procedure was performed as medically indicated.  A timeout was performed.  The area over the left femoral vein was extensively prepped with chlorhexidine and widely draped.  Sterile garb was donned.  The vein was identified by landmarks and easily cannulated.  A wire was gently passed.  The skin was sharply incised and an 8.5 [place] introducer placed Via Seldinger technique.  There was excellent flow from the catheter.  The catheter was sutured in place and a sterile dressing applied.  There was no immediate complication.

## 2017-04-10 NOTE — ED Notes (Signed)
CRITICAL VALUE ALERT  Critical Value:  hgb 5.0  Date & Time Notied:  [phone]  Provider Notified: Dr. Lucianne Lei  Orders Received/Actions taken: Blood was ordered at [facility]

## 2017-04-10 NOTE — Progress Notes (Addendum)
PROGRESS NOTE    Patrick [name]  YPP:[id number] DOB: [birth year] DOA: 04/09/2017 PCP: [name], MD   Outpatient Specialists:     Brief Narrative:  Patrick [name] is a 67 y.o. male with medical history significant of chronic alcoholism, FTT for months comes in with generalized weakness and poor po intake.  He says he is still drinking daily but not as much as he use too.  Only one or two gins a day.  No fevers.  No n/v/d.  No bleeding.  No chest pain.  No sob. Pt sitting up in bed in ED eating a good meal.  No le edema or swelling.  Pt referred for admission for electrolyte abnormalites.   Assessment & Plan:   Principal Problem:   Hypokalemia Active Problems:   Pressure ulcer of sacral region, stage 2   Alcohol abuse   Hypomagnesemia   FTT (failure to thrive) in adult   GI bleed  Upper GI bleed -protonix gtt -GI consult with Dr. 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  Also in ivf overnight.  Replete mag.  obs on tele monitoring.  Hypotension -replete volume with PRBCs and IVF    Pressure ulcer of sacral region, stage 2-  -wound care consult    Alcohol abuse -CIWA    Hypomagnesemia -replete and recheck    FTT (failure to thrive) in adult -palliative care re consult, had seen in past  ABLA due to GI bleed -transfusing 2 units of PRBC and close monitoring of h/h  Hyponatremia -trend  Patient is very sick and this was relayed to sister.  Bed status will be changed to SDU but since BP not going above 70 systolic, will ask for PCCM consult  DVT prophylaxis:  SCD's  Code Status: Full Code   Family Communication: Spoke with sister who states patient has 2 sons who are drug addicts.  Last time patient was hospitalized, he was seen by palliative and she  (sister) was making the medical  decisions but when he went to rehab, no one would communicate with her  Disposition Plan:     Consultants:   GI     Subjective: Had dark bloody vomitus this AM with a drop in his blood pressure  Objective: Vitals:   04/10/17 0856 04/10/17 0900 04/10/17 0915 04/10/17 0923  BP: (!) 79/59 (!) 78/57 (!) 74/57 (!) 81/56  Pulse: (!) 105 (!) 104 (!) 105 (!) 107  Resp: (!) 24 19 (!) 24 (!) 24  Temp: (!) 97.4 F (36.3 C)   (!) 97.5 F (36.4 C)  TempSrc: Oral   Oral  SpO2: 100% 98% 100% 100%    Intake/Output Summary (Last 24 hours) at 04/10/2017 0930 Last data filed at 04/10/2017 0908 Gross per 24 hour  Intake 3385 ml  Output -  Net 3385 ml   There were no vitals filed for this visit.  Examination:  General exam: ill appearing, dark material around mouth Respiratory system: Clear to auscultation. Respiratory effort normal. Cardiovascular system: S1 & S2 heard, RRR. No JVD, murmurs, rubs, gallops or clicks. No pedal edema. Gastrointestinal system: Abdomen is nondistended, soft and nontender. No organomegaly or masses felt. Normal bowel sounds heard. Central nervous system: Alert but not oriented, not cooperative with neuro exam Extremities: weak in all 4 ext Psychiatry: Judgement and insight appear  normal. Mood & affect appropriate.     Data Reviewed: I have personally reviewed following labs and imaging studies  CBC: Recent Labs  Lab 04/09/17 1620 04/09/17 1659 04/10/17 0429  WBC 7.4  --  5.6  NEUTROABS 4.7  --   --   HGB 8.2* 8.5* 5.0*  HCT 24.1* 25.0* 15.2*  MCV 94.5  --  96.8  PLT 258  --  323   Basic Metabolic Panel: Recent Labs  Lab 04/09/17 1620 04/09/17 1659  NA 129* 128*  K 2.1* 2.3*  CL 83* 80*  CO2 30  --   GLUCOSE 98 101*  BUN 5* 4*  CREATININE 0.66 0.60*  CALCIUM 7.8*  --   MG 1.1*  --    GFR: CrCl cannot be calculated (Unknown ideal weight.). Liver Function Tests: Recent Labs  Lab 04/09/17 1620  AST 57*  ALT 28  ALKPHOS 106    BILITOT 1.3*  PROT 6.3*  ALBUMIN 2.4*   No results for input(s): LIPASE, AMYLASE in the last 168 hours. No results for input(s): AMMONIA in the last 168 hours. Coagulation Profile: Recent Labs  Lab 04/09/17 1620  INR 1.09   Cardiac Enzymes: Recent Labs  Lab 04/09/17 1620  CKTOTAL 55  TROPONINI <0.03   BNP (last 3 results) No results for input(s): PROBNP in the last 8760 hours. HbA1C: No results for input(s): HGBA1C in the last 72 hours. CBG: No results for input(s): GLUCAP in the last 168 hours. Lipid Profile: No results for input(s): CHOL, HDL, LDLCALC, TRIG, CHOLHDL, LDLDIRECT in the last 72 hours. Thyroid Function Tests: No results for input(s): TSH, T4TOTAL, FREET4, T3FREE, THYROIDAB in the last 72 hours. Anemia Panel: No results for input(s): VITAMINB12, FOLATE, FERRITIN, TIBC, IRON, RETICCTPCT in the last 72 hours. Urine analysis:    Component Value Date/Time   COLORURINE YELLOW 12/20/2016 1251   APPEARANCEUR HAZY (A) 12/20/2016 1251   LABSPEC 1.013 12/20/2016 1251   PHURINE 5.0 12/20/2016 1251   GLUCOSEU 50 (A) 12/20/2016 1251   HGBUR SMALL (A) 12/20/2016 1251   HGBUR negative 02/21/2010 1055   BILIRUBINUR NEGATIVE 12/20/2016 1251   BILIRUBINUR n 03/11/2014 1025   KETONESUR 20 (A) 12/20/2016 1251   PROTEINUR NEGATIVE 12/20/2016 1251   UROBILINOGEN 0.2 03/11/2014 1025   UROBILINOGEN 0.2 02/21/2010 1055   NITRITE NEGATIVE 12/20/2016 1251   LEUKOCYTESUR NEGATIVE 12/20/2016 1251     )No results found for this or any previous visit (from the past 240 hour(s)).    Anti-infectives (From admission, onward)   None       Radiology Studies: Dg Chest 2 View  Result Date: 04/09/2017 CLINICAL DATA:  Chronic cough and failure to thrive. EXAM: CHEST  2 VIEW COMPARISON:  12/20/2016 FINDINGS: The heart size and mediastinal contours are within normal limits. Minimal aortic atherosclerosis at the arch without aneurysm. Moderate aortic atherosclerosis, stable in  appearance. Emphysematous hyperinflation of the lungs without pneumonic consolidation or CHF. Osteopenic appearance of the thoracic spine. No acute osseous abnormality. IMPRESSION: Hyperinflated lungs consistent with COPD. Electronically Signed   By: Ashley Royalty M.D.   On: 04/09/2017 18:14        Scheduled Meds: . feeding supplement (ENSURE ENLIVE)  237 mL Oral BID BM  . folic acid  1 mg Oral Daily  . LORazepam  0-4 mg Intravenous Q6H   Followed by  . [START ON 04/12/2017] LORazepam  0-4 mg Intravenous Q12H  . metoprolol tartrate  12.5 mg Oral BID  . multivitamin with minerals  1 tablet Oral Daily  . nicotine  21 mg Transdermal Daily  . [START ON 04/13/2017] pantoprazole  40 mg Intravenous Q12H  . thiamine  100 mg Oral Daily   Continuous Infusions: . sodium chloride    . magnesium sulfate 1 - 4 g bolus IVPB    . pantoprazole (PROTONIX) IVPB 80 mg (04/10/17 0920)  . pantoprozole (PROTONIX) infusion       LOS: 0 days    Time spent: 35 min    Geradine Girt, DO [facility] Pager [phone]  If 7PM-7AM, please contact night-coverage [email or web address] Password [email or web address] 04/10/2017, 9:30 AM

## 2017-04-10 NOTE — ED Notes (Addendum)
Tamala Julian, MD did not respond to page. Schorr,MD paged.

## 2017-04-10 NOTE — Consult Note (Signed)
Reason for Consult: Severe anemia and history of LA Grade D esophagitis. Referring Physician: [facility]  Nat Christen HPI: This is a 67 year old male with a PMH of an LA Grade D esophagitis, medication noncompliance, and overall failure to thrive admitted today with a severe anemia.  Apparently his ex-wife called EMS for him.  The patient states that he has not been ambulatory for the past month and his health has declined.  Per the notes he was found to be lying in feces.  In 12/2016 he was found in a similar fashion at home and work up also revealed the severe anemia.  An EGD was performed by Dr. Loletha Carrow and it confirmed the prior findings of an LA Grade D esophagitis.  This was first diagnosed by Dr. Carlean Purl 01/2015 when he was referred by his PCP to Dr. Carlean Purl for heme positive stool.  At that time his HGB was normal at 15 g/dL.  During these EGDs there was no evidence of esophageal varices or portal HTN gastropathy.  He does have a significant ETOH abuse/use history, but all the current work up is negative for cirrhosis.  His current SBP is in the 60-80's range, but he does not appear to be in any acute distress.  HR is in the low 100's.  He admits that he does not take his PPI when he is at home.  The patient denies any overt hematochezia, hematemesis, or melena.  Past Medical History:  Diagnosis Date  . Colon polyps   . Depression   . Diverticulitis   . Diverticulosis of colon   . GERD (gastroesophageal reflux disease)   . Kidney stone     Past Surgical History:  Procedure Laterality Date  . COLONOSCOPY    . ESOPHAGOGASTRODUODENOSCOPY (EGD) WITH PROPOFOL N/A 12/21/2016   Procedure: ESOPHAGOGASTRODUODENOSCOPY (EGD) WITH PROPOFOL;  Surgeon: Doran Stabler, MD;  Location: [facility];  Service: Gastroenterology;  Laterality: N/A;  . UPPER GASTROINTESTINAL ENDOSCOPY      Family History  Problem Relation Age of Onset  . Heart disease Father 8       cabg    Social History:   reports that he has been smoking cigarettes.  He has been smoking about 1.00 pack per day. he has never used smokeless tobacco. He reports that he drinks about 8.4 oz of alcohol per week. He reports that he does not use drugs.  Allergies: No Known Allergies  Medications:  Scheduled: . lidocaine (PF)      . feeding supplement (ENSURE ENLIVE)  237 mL Oral BID BM  . folic acid  1 mg Oral Daily  . LORazepam  0-4 mg Intravenous Q6H   Followed by  . [START ON 04/12/2017] LORazepam  0-4 mg Intravenous Q12H  . metoprolol tartrate  12.5 mg Oral BID  . multivitamin with minerals  1 tablet Oral Daily  . nicotine  21 mg Transdermal Daily  . [START ON 04/13/2017] pantoprazole  40 mg Intravenous Q12H  . thiamine  100 mg Oral Daily   Continuous: . sodium chloride    . magnesium sulfate 1 - 4 g bolus IVPB    . pantoprozole (PROTONIX) infusion 8 mg/hr (04/10/17 0946)    Results for orders placed or performed during the hospital encounter of 04/09/17 (from the past 24 hour(s))  Comprehensive metabolic panel     Status: Abnormal   Collection Time: 04/09/17  4:20 PM  Result Value Ref Range   Sodium 129 (L) 135 -  145 mmol/L   Potassium 2.1 (LL) 3.5 - 5.1 mmol/L   Chloride 83 (L) 101 - 111 mmol/L   CO2 30 22 - 32 mmol/L   Glucose, Bld 98 65 - 99 mg/dL   BUN 5 (L) 6 - 20 mg/dL   Creatinine, Ser 0.66 0.61 - 1.24 mg/dL   Calcium 7.8 (L) 8.9 - 10.3 mg/dL   Total Protein 6.3 (L) 6.5 - 8.1 g/dL   Albumin 2.4 (L) 3.5 - 5.0 g/dL   AST 57 (H) 15 - 41 U/L   ALT 28 17 - 63 U/L   Alkaline Phosphatase 106 38 - 126 U/L   Total Bilirubin 1.3 (H) 0.3 - 1.2 mg/dL   GFR calc non Af Amer >60 >60 mL/min   GFR calc Af Amer >60 >60 mL/min   Anion gap 16 (H) 5 - 15  Ethanol     Status: None   Collection Time: 04/09/17  4:20 PM  Result Value Ref Range   Alcohol, Ethyl (B) <10 <10 mg/dL  Troponin I     Status: None   Collection Time: 04/09/17  4:20 PM  Result Value Ref Range   Troponin I <0.03 <0.03 ng/mL  CBC  with Differential     Status: Abnormal   Collection Time: 04/09/17  4:20 PM  Result Value Ref Range   WBC 7.4 4.0 - 10.5 K/uL   RBC 2.55 (L) 4.22 - 5.81 MIL/uL   Hemoglobin 8.2 (L) 13.0 - 17.0 g/dL   HCT 24.1 (L) 39.0 - 52.0 %   MCV 94.5 78.0 - 100.0 fL   MCH 32.2 26.0 - 34.0 pg   MCHC 34.0 30.0 - 36.0 g/dL   RDW 17.6 (H) 11.5 - 15.5 %   Platelets 258 150 - 400 K/uL   Neutrophils Relative % 63 %   Neutro Abs 4.7 1.7 - 7.7 K/uL   Lymphocytes Relative 27 %   Lymphs Abs 2.0 0.7 - 4.0 K/uL   Monocytes Relative 9 %   Monocytes Absolute 0.6 0.1 - 1.0 K/uL   Eosinophils Relative 1 %   Eosinophils Absolute 0.1 0.0 - 0.7 K/uL   Basophils Relative 0 %   Basophils Absolute 0.0 0.0 - 0.1 K/uL  Protime-INR     Status: None   Collection Time: 04/09/17  4:20 PM  Result Value Ref Range   Prothrombin Time 14.0 11.4 - 15.2 seconds   INR 1.09   Type and screen [place]     Status: None (Preliminary result)   Collection Time: 04/09/17  4:20 PM  Result Value Ref Range   ABO/RH(D) A POS    Antibody Screen NEG    Sample Expiration 04/12/2017    Unit Number [id number]    Blood Component Type RED CELLS,LR    Unit division 00    Status of Unit ISSUED    Transfusion Status OK TO TRANSFUSE    Crossmatch Result Compatible    Unit Number [id number]    Blood Component Type RED CELLS,LR    Unit division 00    Status of Unit ALLOCATED    Transfusion Status OK TO TRANSFUSE    Crossmatch Result Compatible   CK     Status: None   Collection Time: 04/09/17  4:20 PM  Result Value Ref Range   Total CK 55 49 - 397 U/L  Magnesium     Status: Abnormal   Collection Time: 04/09/17  4:20 PM  Result Value Ref Range   Magnesium 1.1 (  L) 1.7 - 2.4 mg/dL  ABO/Rh     Status: None   Collection Time: 04/09/17  4:20 PM  Result Value Ref Range   ABO/RH(D) A POS   I-stat Chem 8, ED     Status: Abnormal   Collection Time: 04/09/17  4:59 PM  Result Value Ref Range   Sodium 128 (L) 135 -  145 mmol/L   Potassium 2.3 (LL) 3.5 - 5.1 mmol/L   Chloride 80 (L) 101 - 111 mmol/L   BUN 4 (L) 6 - 20 mg/dL   Creatinine, Ser 0.60 (L) 0.61 - 1.24 mg/dL   Glucose, Bld 101 (H) 65 - 99 mg/dL   Calcium, Ion 0.93 (L) 1.15 - 1.40 mmol/L   TCO2 33 (H) 22 - 32 mmol/L   Hemoglobin 8.5 (L) 13.0 - 17.0 g/dL   HCT 25.0 (L) 39.0 - 52.0 %   Comment NOTIFIED PHYSICIAN   I-Stat CG4 Lactic Acid, ED     Status: Abnormal   Collection Time: 04/09/17  4:59 PM  Result Value Ref Range   Lactic Acid, Venous 3.39 (HH) 0.5 - 1.9 mmol/L   Comment NOTIFIED PHYSICIAN   Lactic acid, plasma     Status: None   Collection Time: 04/09/17  8:05 PM  Result Value Ref Range   Lactic Acid, Venous 1.8 0.5 - 1.9 mmol/L  I-Stat CG4 Lactic Acid, ED     Status: Abnormal   Collection Time: 04/09/17  8:29 PM  Result Value Ref Range   Lactic Acid, Venous 2.01 (HH) 0.5 - 1.9 mmol/L   Comment NOTIFIED PHYSICIAN   Lactic acid, plasma     Status: None   Collection Time: 04/09/17 11:10 PM  Result Value Ref Range   Lactic Acid, Venous 1.5 0.5 - 1.9 mmol/L  CBC     Status: Abnormal   Collection Time: 04/10/17  4:29 AM  Result Value Ref Range   WBC 5.6 4.0 - 10.5 K/uL   RBC 1.57 (L) 4.22 - 5.81 MIL/uL   Hemoglobin 5.0 (LL) 13.0 - 17.0 g/dL   HCT 15.2 (L) 39.0 - 52.0 %   MCV 96.8 78.0 - 100.0 fL   MCH 31.8 26.0 - 34.0 pg   MCHC 32.9 30.0 - 36.0 g/dL   RDW 18.5 (H) 11.5 - 15.5 %   Platelets 169 150 - 400 K/uL  Prepare RBC     Status: None   Collection Time: 04/10/17  8:29 AM  Result Value Ref Range   Order Confirmation ORDER PROCESSED BY BLOOD BANK      Dg Chest 2 View  Result Date: 04/09/2017 CLINICAL DATA:  Chronic cough and failure to thrive. EXAM: CHEST  2 VIEW COMPARISON:  12/20/2016 FINDINGS: The heart size and mediastinal contours are within normal limits. Minimal aortic atherosclerosis at the arch without aneurysm. Moderate aortic atherosclerosis, stable in appearance. Emphysematous hyperinflation of the lungs  without pneumonic consolidation or CHF. Osteopenic appearance of the thoracic spine. No acute osseous abnormality. IMPRESSION: Hyperinflated lungs consistent with COPD. Electronically Signed   By: Ashley Royalty M.D.   On: 04/09/2017 18:14    ROS:  As stated above in the HPI otherwise negative.  Blood pressure (!) 80/50, pulse (!) 101, temperature (!) 97.5 F (36.4 C), temperature source Oral, resp. rate (!) 22, SpO2 (!) 87 %.    PE: Gen: NAD, Alert and Oriented, pale, soft spoken, discheveled HEENT:  [place]/AT, EOMI Neck: Supple, no LAD Lungs: CTA Bilaterally CV: RRR without M/G/R ABM: Soft, NTND, +BS Ext:  No C/C/E  Assessment/Plan: 1) Severe anemia. 2) History of LA Grade D esophagitis. 3) Medication noncompliance. 4) Hypotension.   I suspect that he is bleeding again from his esophagitis as he has not taken his PPI.  His presentation today mirrors the presentation in September 2018.  His lowest HGB in September was at 5.4 g/dL and today it is at 5.0.  There are no signs of overt bleeding, but his BP is tenuous.  If the patient's hemodynamic stability does not improve with blood transfusions and resuscitation an emergent EGD may have to be performed, although it will be high risk.  There is no evidence of cirrhosis or any suspicion of a variceal bleed.  Plan: 1) PPI. 2) Transfusion and fluid resuscitation. Kimari Lienhard D 04/10/2017, 10:54 AM

## 2017-04-10 NOTE — Consult Note (Signed)
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  His wife called EMS this morning as he is not been active at home.  They found him surrounded in stool on the floor and brought him to the department emergency medicine where he was found to be hypokalemic and subsequently hypotensive.  He received a total of 3 L of crystalloid and his blood pressure actually dropped.  After the 3 L of crystalloid his hemoglobin was 5.5.  Despite caked emesis in his beard.  He denies protracted nausea and vomiting.  He denies melena.  He denies abdominal pain. Pertinent to the failure of his blood pressure to respond to crystalloids, he denies fevers chills or sweats.  He does report a productive cough.  He denies abdominal pain, he denies dysuria.  He is a very poor historian   PAST MEDICAL HISTORY :  He  has a past medical history of Colon polyps, Depression, Diverticulitis, Diverticulosis of colon, GERD (gastroesophageal reflux disease), and Kidney stone.  PAST SURGICAL HISTORY: He  has a past surgical history that includes Colonoscopy; Upper gastrointestinal endoscopy; and Esophagogastroduodenoscopy (egd) with propofol (N/A, 12/21/2016).  No Known Allergies  No current facility-administered medications on file prior to encounter.    Current Outpatient Medications on File Prior to Encounter  Medication Sig  . pantoprazole (PROTONIX) 40 MG tablet Take 1 tablet (40 mg total) by mouth 2 (two) times daily.  Marland Kitchen albuterol (VENTOLIN HFA) 108 (90 BASE) MCG/ACT inhaler Inhale 2 puffs into the lungs every 4 (four) hours as needed for wheezing or shortness of breath. (Patient not taking: Reported on 04/09/2017)  . dibucaine (NUPERCAINAL) 1 % OINT Place 1  application rectally as needed (rectal pain). (Patient not taking: Reported on 04/09/2017)  . feeding supplement, ENSURE ENLIVE, (ENSURE ENLIVE) LIQD Take 237 mLs by mouth 2 (two) times daily between meals. (Patient not taking: Reported on 01/04/2017)  . ferrous sulfate 325 (65 FE) MG tablet Take 1 tablet (325 mg total) by mouth 3 (three) times daily with meals. (Patient not taking: Reported on 04/09/2017)  . folic acid (FOLVITE) 1 MG tablet Take 1 tablet (1 mg total) by mouth daily. (Patient not taking: Reported on 04/09/2017)  . Hydrocortisone (GERHARDT'S BUTT CREAM) CREA Apply 1 application topically 2 (two) times daily. (Patient not taking: Reported on 04/09/2017)  . Maltodextrin-Xanthan Gum (RESOURCE THICKENUP CLEAR) POWD Add to liquids to make nectar thick. (Patient not taking: Reported on 04/09/2017)  . metoprolol tartrate (LOPRESSOR) 25 MG tablet Take 0.5 tablets (12.5 mg total) by mouth 2 (two) times daily. (Patient not taking: Reported on 04/09/2017)  . nicotine (NICODERM CQ - DOSED IN MG/24 HOURS) 21 mg/24hr patch Place 1 patch (21 mg total) onto the skin daily.  . polyethylene glycol powder (GLYCOLAX/MIRALAX) powder Take 17 grams in 8 oz of water or juice daily. (Patient not taking: Reported on 04/09/2017)  . thiamine 100 MG tablet Take 1 tablet (100 mg total) by mouth daily. (Patient not taking: Reported on 04/09/2017)    FAMILY HISTORY:  His indicated that his mother is alive. He indicated that his father is alive.   SOCIAL HISTORY: He  reports that he has been smoking cigarettes.  He has been smoking about 1.00  pack per day. he has never used smokeless tobacco. He reports that he drinks about 8.4 oz of alcohol per week. He reports that he does not use drugs.  REVIEW OF SYSTEMS:   Not obtainable  SUBJECTIVE:  As above  VITAL SIGNS: BP (!) 78/55   Pulse (!) 103   Temp (!) 97.5 F (36.4 C) (Oral)   Resp (!) 24   SpO2 99%   HEMODYNAMICS:    VENTILATOR SETTINGS:     INTAKE / OUTPUT: I/O last 3 completed shifts: In: 3050 [IV Piggyback:3050] Out: -   PHYSICAL EXAMINATION: General: This is a very disheveled individual who is thin to cachectic with matted hematemesis in his beard. Neuro: Cannot tell me the day date or place.  Pupils are equal EOMs appear to be full, the face is symmetric.  He moves all fours. Cardiovascular: There is no JVD, there is no dependent edema, the limbs are pink and warm.  Femoral pulses are bounding.  S1 and S2 are regular without murmur rub or gallop. Lungs: Some scattered rhonchi and no wheezes, air movement appears to be somewhat decreased on the right. Abdomen: Given is flat and soft without any organomegaly masses tenderness guarding or rebound.  He is not overtly icteric.  LABS:  BMET Recent Labs  Lab 04/09/17 1620 04/09/17 1659  NA 129* 128*  K 2.1* 2.3*  CL 83* 80*  CO2 30  --   BUN 5* 4*  CREATININE 0.66 0.60*  GLUCOSE 98 101*    Electrolytes Recent Labs  Lab 04/09/17 1620  CALCIUM 7.8*  MG 1.1*    CBC Recent Labs  Lab 04/09/17 1620 04/09/17 1659 04/10/17 0429  WBC 7.4  --  5.6  HGB 8.2* 8.5* 5.0*  HCT 24.1* 25.0* 15.2*  PLT 258  --  169    Coag's Recent Labs  Lab 04/09/17 1620  INR 1.09    Sepsis Markers Recent Labs  Lab 04/09/17 2005 04/09/17 2029 04/09/17 2310  LATICACIDVEN 1.8 2.01* 1.5    ABG No results for input(s): PHART, PCO2ART, PO2ART in the last 168 hours.  Liver Enzymes Recent Labs  Lab 04/09/17 1620  AST 57*  ALT 28  ALKPHOS 106  BILITOT 1.3*  ALBUMIN 2.4*    Cardiac Enzymes Recent Labs  Lab 04/09/17 1620  TROPONINI <0.03    Glucose No results for input(s): GLUCAP in the last 168 hours.  Imaging Dg Chest 2 View  Result Date: 04/09/2017 CLINICAL DATA:  Chronic cough and failure to thrive. EXAM: CHEST  2 VIEW COMPARISON:  12/20/2016 FINDINGS: The heart size and mediastinal contours are within normal limits. Minimal aortic atherosclerosis at  the arch without aneurysm. Moderate aortic atherosclerosis, stable in appearance. Emphysematous hyperinflation of the lungs without pneumonic consolidation or CHF. Osteopenic appearance of the thoracic spine. No acute osseous abnormality. IMPRESSION: Hyperinflated lungs consistent with COPD. Electronically Signed   By: Ashley Royalty M.D.   On: 04/09/2017 18:14      ANTIBIOTICS: Zosy and Azithromycin started 1/1   LINES/TUBES: Left femoral 8.5 FR introducer placed 1/1  DISCUSSION:      This is a 67 year old with an apparent chronic alcohol related encephalopathy and failure to thrive.  His wife called EMS this morning who found him on the floor surrounded by stool.  Initial observations were hypokalemia and relative hypotension.  He received a total of 3 L of crystalloid and his blood pressure failed to respond. We were asked to evaluate for hypotension and extreme anemia. ASSESSMENT /  PLAN:  PULMONARY A: On physical examination his breath sounds are somewhat asymmetric I am awaiting a chest x-ray but in the interim we will empirically cover for aspiration or community-acquired processes.   CARDIOVASCULAR A: Hypotension.  He is currently receiving 2 units of packed red blood cells and I will revisit the patient to evaluate his volume status once those have been infused.  I suspicion of a cardiac provocation is very limited he had an echocardiogram done in August which showed normal LV function.  Very concerned that he may have a secondary infection in that volume loss is not in fact the main provocation for his hypotension.  I have empirically treated him and ordered cultures of blood and urine and I am awaiting a chest x-ray as noted.  A serum cortisol is also pending.  GASTROINTESTINAL A: He is remarkably anemic and does have some caked hematemesis in his beard however his stool is brown.  I suspect that he has a chronic oozing blood loss and that his GI loss does not represent the acute  provocation for his hypotension.  He is known to have a history of esophagitis related to his alcohol ingestion.  He has been started on a Protonix infusion he is being transfused as noted and GI has been consulted  HEMATOLOGIC A: Normocytic anemia.  Likely a combination of chronic alcoholism and chronic blood loss.  INFECTIOUS A: Surveying for infections as noted   ENDOCRINE A: Serum cortisol is pending   The etiology of his hypokalemia is likely chronic nausea and vomiting, he is being volume repleted and potassium replaced as noted   Greater than 32 minutes was spent in the care of this patient today not counting time performing procedures  Lars Masson, MD Pulmonary and [place] Pager: [phone]  04/10/2017, 11:20 AM

## 2017-04-10 NOTE — ED Notes (Signed)
Critical Care aware of patient BP

## 2017-04-10 NOTE — ED Notes (Signed)
Spoke with MD. Was advised to give a half bolus before Ativan and then finish bolus.

## 2017-04-10 NOTE — ED Notes (Addendum)
BP 81/53 after liter bolus. MD paged. Will continue to monitor.

## 2017-04-10 NOTE — ED Notes (Addendum)
Admitting MD paged...  Frizell TRAA.  Patient BP still remains low patient receiving Blood. Last BP manual 64/48.   Carmelina Paddock RN

## 2017-04-10 NOTE — ED Notes (Signed)
Pt. Unable to urinate at this time. Urinal given to Pt.

## 2017-04-10 NOTE — ED Notes (Signed)
Pt. Showing signs of withdrawal. MD paged.

## 2017-04-10 NOTE — ED Notes (Signed)
Report attempted 

## 2017-04-10 NOTE — ED Notes (Addendum)
Pt. Found covered in vomit. Vomit was dark red. Pt. Also had bowel movement. Pt. States the vomit looked consistent with what he has been having. Pt. Cleaned up. MD paged. Will continue to monitor.

## 2017-04-10 NOTE — ED Notes (Signed)
Admitting at bedside aware of BP and Patient new altered status,.

## 2017-04-10 NOTE — ED Notes (Addendum)
BP 76/53 with MAP 63. Pt. Has not urinated in 4+ hours. Schorr, MD paged. Will continue to monitor.

## 2017-04-10 NOTE — ED Notes (Signed)
The patient's bottom is red and has open wounds to both buttocks.  EMT and RN turned patient on his right side.  Will turn every two hours to keep off bottom.  The patient does have a condom cath placed by prior shift.

## 2017-04-10 NOTE — Progress Notes (Signed)
PT Cancellation Note  Patient Details Name: Patrick [name] MRN: [id number] DOB: 01-Aug-1950   Cancelled Treatment:    Reason Eval/Treat Not Completed: Patient not medically ready. Pt currently not stable for PT eval. Will check back tomorrow.    [place] 04/10/2017, 4:07 PM

## 2017-04-11 ENCOUNTER — Encounter (HOSPITAL_COMMUNITY): Payer: Self-pay | Admitting: *Deleted

## 2017-04-11 ENCOUNTER — Other Ambulatory Visit: Payer: Self-pay

## 2017-04-11 DIAGNOSIS — Z66 Do not resuscitate: Secondary | ICD-10-CM

## 2017-04-11 DIAGNOSIS — F1010 Alcohol abuse, uncomplicated: Secondary | ICD-10-CM

## 2017-04-11 DIAGNOSIS — K2210 Ulcer of esophagus without bleeding: Secondary | ICD-10-CM

## 2017-04-11 DIAGNOSIS — D649 Anemia, unspecified: Secondary | ICD-10-CM

## 2017-04-11 DIAGNOSIS — K920 Hematemesis: Secondary | ICD-10-CM

## 2017-04-11 DIAGNOSIS — I959 Hypotension, unspecified: Secondary | ICD-10-CM

## 2017-04-11 DIAGNOSIS — R634 Abnormal weight loss: Secondary | ICD-10-CM

## 2017-04-11 DIAGNOSIS — Z515 Encounter for palliative care: Secondary | ICD-10-CM

## 2017-04-11 DIAGNOSIS — R627 Adult failure to thrive: Secondary | ICD-10-CM

## 2017-04-11 LAB — TYPE AND SCREEN
ABO/RH(D): A POS
Antibody Screen: NEGATIVE
Unit division: 0
Unit division: 0

## 2017-04-11 LAB — CBC
HCT: 24.9 % — ABNORMAL LOW (ref 39.0–52.0)
HEMOGLOBIN: 8.5 g/dL — AB (ref 13.0–17.0)
MCH: 32.1 pg (ref 26.0–34.0)
MCHC: 34.1 g/dL (ref 30.0–36.0)
MCV: 94 fL (ref 78.0–100.0)
PLATELETS: 185 10*3/uL (ref 150–400)
RBC: 2.65 MIL/uL — AB (ref 4.22–5.81)
RDW: 18.2 % — ABNORMAL HIGH (ref 11.5–15.5)
WBC: 5.1 10*3/uL (ref 4.0–10.5)

## 2017-04-11 LAB — BPAM RBC
Blood Product Expiration Date: 201901012359
Blood Product Expiration Date: 201901022359
ISSUE DATE / TIME: 201901010853
ISSUE DATE / TIME: 201901011203
UNIT TYPE AND RH: 600
Unit Type and Rh: 600

## 2017-04-11 LAB — BASIC METABOLIC PANEL
Anion gap: 6 (ref 5–15)
BUN: 11 mg/dL (ref 6–20)
CALCIUM: 7.2 mg/dL — AB (ref 8.9–10.3)
CHLORIDE: 109 mmol/L (ref 101–111)
CO2: 20 mmol/L — ABNORMAL LOW (ref 22–32)
CREATININE: 0.8 mg/dL (ref 0.61–1.24)
GFR calc Af Amer: >60 mL/min (ref >60)
GFR calc non Af Amer: >60 mL/min (ref >60)
Glucose, Bld: 117 mg/dL — ABNORMAL HIGH (ref 65–99)
Potassium: 3 mmol/L — ABNORMAL LOW (ref 3.5–5.1)
SODIUM: 135 mmol/L (ref 135–145)

## 2017-04-11 LAB — URINE CULTURE
CULTURE: NO GROWTH
SPECIAL REQUESTS: NORMAL

## 2017-04-11 LAB — TROPONIN I: Troponin I: <0.03 ng/mL (ref <0.03)

## 2017-04-11 LAB — PROCALCITONIN: Procalcitonin: 0.42 ng/mL

## 2017-04-11 LAB — MAGNESIUM: Magnesium: 1.8 mg/dL (ref 1.7–2.4)

## 2017-04-11 LAB — PHOSPHORUS: Phosphorus: 2.4 mg/dL — ABNORMAL LOW (ref 2.5–4.6)

## 2017-04-11 MED ORDER — POTASSIUM PHOSPHATES 15 MMOLE/5ML IV SOLN
20.0000 meq | Freq: Once | INTRAVENOUS | Status: AC
  Administered 2017-04-11: 20 meq via INTRAVENOUS
  Filled 2017-04-11: qty 4.55

## 2017-04-11 MED ORDER — IPRATROPIUM-ALBUTEROL 0.5-2.5 (3) MG/3ML IN SOLN
3.0000 mL | Freq: Two times a day (BID) | RESPIRATORY_TRACT | Status: DC
  Administered 2017-04-12 – 2017-04-13 (×3): 3 mL via RESPIRATORY_TRACT
  Filled 2017-04-11 (×4): qty 3

## 2017-04-11 MED ORDER — POTASSIUM CHLORIDE CRYS ER 20 MEQ PO TBCR: 40.0000 meq | EXTENDED_RELEASE_TABLET | Freq: Once | ORAL | Status: DC

## 2017-04-11 MED ORDER — POTASSIUM CHLORIDE 20 MEQ/15ML (10%) PO SOLN
40.0000 meq | Freq: Once | ORAL | Status: AC
  Administered 2017-04-11: 40 meq via ORAL
  Filled 2017-04-11: qty 30

## 2017-04-11 MED ORDER — IPRATROPIUM-ALBUTEROL 0.5-2.5 (3) MG/3ML IN SOLN
3.0000 mL | Freq: Four times a day (QID) | RESPIRATORY_TRACT | Status: DC
  Administered 2017-04-11 (×3): 3 mL via RESPIRATORY_TRACT
  Filled 2017-04-11 (×4): qty 3

## 2017-04-11 MED ORDER — PANTOPRAZOLE SODIUM 40 MG IV SOLR
40.0000 mg | Freq: Two times a day (BID) | INTRAVENOUS | Status: DC
  Administered 2017-04-11 – 2017-04-14 (×7): 40 mg via INTRAVENOUS
  Filled 2017-04-11 (×7): qty 40

## 2017-04-11 NOTE — Consult Note (Addendum)
[facility] Nurse wound consult note Reason for Consult: Consult requested for buttocks.  Pt admits he was frequently incontinent of urine when at home and spends most of his time on the sofa. Wound type: Multiple areas of full thickness skin loss to bilat buttocks; scatteded across affected area approx 13X15.  All are round, red and moist; largest is approx .8X.8X.2cm and most are smaller in size and depth. Appearance is consistent with moisture associated skin damage. Sacrum has 2 areas of stage 2 pressure injuries to the left and right sides; .2X.2X.1cm and 1X.3X.1cm, both are pink and moist. Pressure Injury POA: Yes Dressing procedure/placement/frequency: Pt now has a condom cath to attempt to contain urine and he is on a low airloss bed to reduce pressure and increase airflow to bilat buttocks.  Barrier cream to repel moisture and promote healing.  Foam dressing to sacrum wounds to protect from further injury.  Discussed plan of care with patient. Please re-consult if further assistance is needed.  Thank-you,  Julien Girt MSN, [place], [place], [place], [place]

## 2017-04-11 NOTE — Progress Notes (Signed)
[facility] Progress Note  Chief Complaint:    Anemia  Subjective: No complaints. Says he doesn't take NSAIDS at home. No abdominal pain / nausea. Denies overt bleeding.   Objective:  Vital signs in last 24 hours: Temp:  [97.3 F (36.3 C)-98.3 F (36.8 C)] 97.4 F (36.3 C) (01/02 1134) Pulse Rate:  [85-117] 112 (01/02 1130) Resp:  [13-27] 20 (01/02 1130) BP: (84-125)/(58-95) 108/74 (01/02 1130) SpO2:  [87 %-100 %] 99 % (01/02 1130) Weight:  [124 lb 5.4 oz (56.4 kg)-133 lb 6.1 oz (60.5 kg)] 133 lb 6.1 oz (60.5 kg) (01/02 0400) Last BM Date: 04/10/17 General:   Alert, thin white male in NAD EENT:  Normal hearing, non icteric sclera, conjunctive pink.  Heart:  Regular rate and rhythm, no lower extremity edema Pulm: Normal respiratory effort Abdomen:  Soft, nondistended, nontender.  Normal bowel sounds, no masses felt. No hepatomegaly.    Neurologic:  Alert and  oriented x4;  grossly normal neurologically. Psych:  Pleasant, cooperative.  Normal mood and affect.   Intake/Output from previous day: 01/01 0701 - 01/02 0700 In: 2386.4 [I.V.:606.4; Blood:1280; IV Piggyback:500] Out: 700 [Urine:700] Intake/Output this shift: Total I/O In: 109.4 [I.V.:109.4] Out: -   Lab Results: Recent Labs    04/09/17 1620  04/10/17 0429 04/10/17 1624 04/11/17 0401  WBC 7.4  --  5.6  --  5.1  HGB 8.2*   < > 5.0* 9.2* 8.5*  HCT 24.1*   < > 15.2* 27.1* 24.9*  PLT 258  --  169  --  185   < > = values in this interval not displayed.   BMET Recent Labs    04/09/17 1620 04/09/17 1659 04/10/17 0429 04/11/17 0401  NA 129* 128* 133* 135  K 2.1* 2.3* 3.4* 3.0*  CL 83* 80* 103 109  CO2 30  --  26 20*  GLUCOSE 98 101* 123* 117*  BUN 5* 4* 11 11  CREATININE 0.66 0.60* 0.71 0.80  CALCIUM 7.8*  --  6.7* 7.2*   LFT Recent Labs    04/09/17 1620  PROT 6.3*  ALBUMIN 2.4*  AST 57*  ALT 28  ALKPHOS 106  BILITOT 1.3*   PT/INR Recent Labs    04/09/17 1620  LABPROT  14.0  INR 1.09    ASSESSMENT / PLAN:   1. 67 yo male with recurrent, severe [place] anemia. Hgb 5.0, down from mid 8 in October. Denies overt GI bleeding, stool Heme positive. Spoke with RN, patient just had a stool which was combination of yellow  / dark brown.  He is s/p 2 units of blood yesterday with ~ 3 gram rise in hgb to 8.5. Very similar scenario in late September with findings of LA Grade D esophagitis. Patient doesn't know if he takes reflux med at home. Hx has a hx of ETOH but no findings of portal HTN or varices. Bleeding probably secondary to esophagitis, especially since he may not even be on PPI at home. Anemia may also be due in part to nutritional deficiencies.  -on a PPI gtt , probably not needed but given that he just passed stool which was partly dark brown I will leave the gtt going.  -No immediate need for EGD unless actively bleeds.  -Outpatient surveillance colonoscopy for hx of adenomatous colon polyps.   2. Hx of ETOH abuse. Sounds like he has been cutting back. Reports one daily mixed drink. No evidence for cirrhosis on previous workups.   3. Electrolyte  abnormalities, correction in progress  4. Probable malnutrition with hypoalbuminemia / weight loss. Weight in mid 140's late summer, now weighing 120s-130s this admission (not sure why 10 pound discrepancy but weight down at least 10 pounds.  He had a CT scan with contrast late September and there were no acute findings or suspicious lesions.    Principal Problem:   Hypokalemia Active Problems:   Pressure ulcer of sacral region, stage 2   Alcohol abuse   Hypotension   Hypomagnesemia   FTT (failure to thrive) in adult   GI bleed     LOS: 1 day   Tye Savoy ,NP 04/11/2017, 12:00 PM  Pager number [phone]

## 2017-04-11 NOTE — Progress Notes (Signed)
PT Cancellation Note  Patient Details Name: Patrick [name] MRN: [id number] DOB: [birth year]   Cancelled Treatment:    Reason Eval/Treat Not Completed: Patient not medically ready. Pt with temporary femoral catheter placed yesterday. Per RN, will not remove today, requesting PT follow-up for evaluation tomorrow.  Mabeline Caras, PT, DPT Acute Rehab Services  Pager: [place] 04/11/2017, 12:40 PM

## 2017-04-11 NOTE — Progress Notes (Signed)
Chart reviewed, GI notes reflect no plan for EGD unless active bleed.  Will transition to medical telemetry and TRH.    Noe Gens, NP-C [facility] Pgr: [phone] or if no answer [phone] 04/11/2017, 3:29 PM

## 2017-04-11 NOTE — Progress Notes (Signed)
Pt requesting beard to be shaved. Pt alert and oriented. Patrick [name] Plan will be shaved per patient request.

## 2017-04-11 NOTE — Consult Note (Signed)
Consultation Note Date: 04/11/2017   Patient Name: Patrick [name]  DOB: 1950-11-15  MRN: [id number]  Age / Sex: 67 y.o., male  PCP: [name], MD Referring Physician: Sampson Goon, MD  Reason for Consultation: Establishing goals of care and Psychosocial/spiritual support  HPI/Patient Profile: 67 y.o. male  Admitted on  04/09/2017 with medical history significant of chronic alcoholism, FTT for months comes in with generalized weakness and poor po intake.  He says he is still drinking daily   Patient has had multiple rehospitalizations in the last 6 months for similar evetns  Complicated psychosocial situation.    Questionable living conditions, his 47 year old son who lives with him and is his main support person reportedly has addiction issues himself.  Patient has  come into the hospital on multiple occasions having been found on the floor in stool and urine.  He has had significant weight loss, unsure of access to food.  Will alert SW  He has another son who is currently in a rehab detox program in [place] currently. His sister Patrick [name] who agrees to being his H POA reports "a very dysfunctional situation"  Faces treatment option decisions, advanced directive decisions, and anticipatory care needs    Clinical Assessment and Goals of Care:  This NP Wadie Lessen reviewed medical records, received report from team, assessed the patient and then meet at the patient's bedside   to discuss diagnosis, prognosis, GOC,  disposition and options.  Patient  is alert and oriented and engages with me regarding his current situation.  He is able to speak to consequences of his decisions over the past many months.  A discussion was had today regarding advanced directives.   Concept specific to code status was had.  We discussed if in the event of cardiac arrest would he want heroics of CPR and intubation  and he clearly verbalizes he would not want this. Sister supports this decision     DNR/DNI documented   I placed a call and spoke  to noted contact and ex-wife/ Patrick [name] and sister/ Patrick [name] to discuss medical situation.  They both speak to a ongoing dysfunctional situation.  They try to do what they can but clearly set realistic boundaries.  Discussed  the importance of securing a healthcare power of attorney.  Patient agrees that his sister Patrick [name] would be the best person and she agrees to be that support person for him.  Will consult spiritual care to secure H POA why patient is hospitalized    Values and goals of care important to patient and family were attempted to be elicited.   Concept of  Palliative Care was discussed with patient and his family.  Patient had a previous palliative medicine consult in August 2018    Questions and concerns addressed.   Family encouraged to call with questions or concerns.  PMT will continue to support holistically.  No documented healthcare power of attorney at this time however patient agrees that his sister Patrick [name]  in chart would  be in his best interest.  Patrick [name] agrees.  Spiritual care will document H POA tomorrow.  SUMMARY OF RECOMMENDATIONS    Code Status/Advance Care Planning:  DNR-documented today   Symptom Management:   Weakness: Physical therapy evaluation and treatment   Additional Recommendations (Limitations, Scope, Preferences):  Full Scope Treatment  Psycho-social/Spiritual:   Desire for further Chaplaincy support:yes  Additional Recommendations: Social work consult to evaluate possible need for adult protective services  Prognosis:   Unable to determine  Discharge Planning: To Be Determined      Primary Diagnoses: Present on Admission: . Hypokalemia . Alcohol abuse . Hypomagnesemia . Pressure ulcer of sacral region, stage 2 . FTT (failure to thrive) in adult . GI bleed .  Hypotension   I have reviewed the medical record, interviewed the patient and family, and examined the patient. The following aspects are pertinent.  Past Medical History:  Diagnosis Date  . Colon polyps   . Depression   . Diverticulitis   . Diverticulosis of colon   . GERD (gastroesophageal reflux disease)   . Kidney stone    Social History   Socioeconomic History  . Marital status: Divorced    Spouse name: Not on file  . Number of children: Not on file  . Years of education: Not on file  . Highest education [name]: Not on file  Social Needs  . Financial resource strain: Not on file  . Food insecurity - worry: Not on file  . Food insecurity - inability: Not on file  . Transportation needs - medical: Not on file  . Transportation needs - non-medical: Not on file  Occupational History  . Not on file  Tobacco Use  . Smoking status: Current Every Day Smoker    Packs/day: 1.00    Types: Cigarettes  . Smokeless tobacco: Never Used  Substance and Sexual Activity  . Alcohol use: Yes    Alcohol/week: 8.4 oz    Types: 14 Glasses of wine per week  . Drug use: No  . Sexual activity: No  Other Topics Concern  . Not on file  Social History Narrative  . Not on file   Family History  Problem Relation Age of Onset  . Heart disease Father 27       cabg   Scheduled Meds: . chlorhexidine  15 mL Mouth Rinse BID  . Chlorhexidine Gluconate Cloth  6 each Topical Daily  . feeding supplement (ENSURE ENLIVE)  237 mL Oral BID BM  . folic acid  1 mg Oral Daily  . ipratropium-albuterol  3 mL Nebulization Q6H  . mouth rinse  15 mL Mouth Rinse q12n4p  . metoprolol tartrate  12.5 mg Oral BID  . multivitamin with minerals  1 tablet Oral Daily  . nicotine  21 mg Transdermal Daily  . [START ON 04/13/2017] pantoprazole  40 mg Intravenous Q12H  . sodium chloride flush  10-40 mL Intracatheter Q12H  . thiamine  100 mg Oral Daily   Continuous Infusions: . pantoprozole (PROTONIX) infusion 8  mg/hr (04/11/17 1100)  . piperacillin-tazobactam 3.375 g (04/11/17 1145)  . potassium PHOSPHATE IVPB (mEq) 20 mEq (04/11/17 1122)   PRN Meds:.albuterol, HYDROcodone-acetaminophen, LORazepam **OR** LORazepam, ondansetron **OR** ondansetron (ZOFRAN) IV, sodium chloride flush Medications Prior to Admission:  Prior to Admission medications   Medication Sig Start Date End Date Taking? Authorizing Provider  pantoprazole (PROTONIX) 40 MG tablet Take 1 tablet (40 mg total) by mouth 2 (two) times daily. 12/01/16  Yes Janece Canterbury, MD  albuterol (  VENTOLIN HFA) 108 (90 BASE) MCG/ACT inhaler Inhale 2 puffs into the lungs every 4 (four) hours as needed for wheezing or shortness of breath. Patient not taking: Reported on 04/09/2017 08/17/14   [name], MD  dibucaine (NUPERCAINAL) 1 % OINT Place 1 application rectally as needed (rectal pain). Patient not taking: Reported on 04/09/2017 01/08/17   Donne Hazel, MD  feeding supplement, ENSURE ENLIVE, (ENSURE ENLIVE) LIQD Take 237 mLs by mouth 2 (two) times daily between meals. Patient not taking: Reported on 01/04/2017 12/01/16   Janece Canterbury, MD  ferrous sulfate 325 (65 FE) MG tablet Take 1 tablet (325 mg total) by mouth 3 (three) times daily with meals. Patient not taking: Reported on 04/09/2017 12/01/16   Janece Canterbury, MD  folic acid (FOLVITE) 1 MG tablet Take 1 tablet (1 mg total) by mouth daily. Patient not taking: Reported on 04/09/2017 12/02/16   Janece Canterbury, MD  Hydrocortisone (GERHARDT'S BUTT CREAM) CREA Apply 1 application topically 2 (two) times daily. Patient not taking: Reported on 04/09/2017 12/01/16   Janece Canterbury, MD  Maltodextrin-Xanthan Gum ([place]) POWD Add to liquids to make nectar thick. Patient not taking: Reported on 04/09/2017 12/01/16   Janece Canterbury, MD  metoprolol tartrate (LOPRESSOR) 25 MG tablet Take 0.5 tablets (12.5 mg total) by mouth 2 (two) times daily. Patient not taking: Reported  on 04/09/2017 01/08/17   Donne Hazel, MD  nicotine (NICODERM CQ - DOSED IN MG/24 HOURS) 21 mg/24hr patch Place 1 patch (21 mg total) onto the skin daily. 12/02/16   Janece Canterbury, MD  polyethylene glycol powder (GLYCOLAX/MIRALAX) powder Take 17 grams in 8 oz of water or juice daily. Patient not taking: Reported on 04/09/2017 02/23/15   Zehr, Laban Emperor, PA-C  thiamine 100 MG tablet Take 1 tablet (100 mg total) by mouth daily. Patient not taking: Reported on 04/09/2017 12/02/16   Janece Canterbury, MD   No Known Allergies Review of Systems  Constitutional: Positive for fatigue.  Skin:       Itchy skin on "my bottom'  Neurological: Positive for weakness.    Physical Exam  Constitutional: He is oriented to person, place, and time. He appears cachectic. He appears ill.  Cardiovascular: Tachycardia present.  Pulmonary/Chest: He has decreased breath sounds.  Neurological: He is alert and oriented to person, place, and time. He displays tremor.  Skin: Skin is warm and dry.  gray    Vital Signs: BP 108/74   Pulse (!) 112   Temp (!) 97.4 F (36.3 C) (Oral)   Resp 20   Ht 5\' 7"  (1.702 m)   Wt 60.5 kg (133 lb 6.1 oz)   SpO2 99%   BMI 20.89 kg/m  Pain Assessment: No/denies pain POSS *See Group Information*: S-Acceptable,Sleep, easy to arouse Pain Score: Asleep   SpO2: SpO2: 99 % O2 Device:SpO2: 99 % O2 Flow Rate: .O2 Flow Rate (L/min): 3 L/min  IO: Intake/output summary:   Intake/Output Summary (Last 24 hours) at 04/11/2017 1209 Last data filed at 04/11/2017 1100 Gross per 24 hour  Intake 1820.78 ml  Output 700 ml  Net 1120.78 ml    LBM: Last BM Date: 04/10/17 Baseline Weight: Weight: 56.4 kg (124 lb 5.4 oz) Most recent weight: Weight: 60.5 kg (133 lb 6.1 oz)     Palliative Assessment/Data: 40 %   Discussed with bedside nurse and spiritual care, Dr Lamonte Sakai CCM/Brandi Alfredo Martinez NP Time In: 1545 Time Out: 1700 Time Total: 75 minutes Greater than 50%  of this  time was spent  counseling and coordinating care related to the above assessment and plan.  Signed by: Wadie Lessen, NP   Please contact Palliative Medicine Team phone at [phone] for questions and concerns.  For individual provider: See Shea Evans
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
## 2017-04-12 ENCOUNTER — Encounter (HOSPITAL_COMMUNITY): Payer: Self-pay

## 2017-04-12 ENCOUNTER — Inpatient Hospital Stay (HOSPITAL_COMMUNITY): Payer: Self-pay

## 2017-04-12 DIAGNOSIS — R195 Other fecal abnormalities: Secondary | ICD-10-CM

## 2017-04-12 DIAGNOSIS — E876 Hypokalemia: Secondary | ICD-10-CM

## 2017-04-12 DIAGNOSIS — E43 Unspecified severe protein-calorie malnutrition: Secondary | ICD-10-CM

## 2017-04-12 DIAGNOSIS — K209 Esophagitis, unspecified: Secondary | ICD-10-CM

## 2017-04-12 LAB — BASIC METABOLIC PANEL
ANION GAP: 5 (ref 5–15)
BUN: 6 mg/dL (ref 6–20)
CALCIUM: 7.7 mg/dL — AB (ref 8.9–10.3)
CHLORIDE: 112 mmol/L — AB (ref 101–111)
CO2: 21 mmol/L — AB (ref 22–32)
Creatinine, Ser: 0.79 mg/dL (ref 0.61–1.24)
GFR calc Af Amer: >60 mL/min (ref >60)
GFR calc non Af Amer: >60 mL/min (ref >60)
GLUCOSE: 71 mg/dL (ref 65–99)
Potassium: 3.3 mmol/L — ABNORMAL LOW (ref 3.5–5.1)
Sodium: 138 mmol/L (ref 135–145)

## 2017-04-12 LAB — CBC
HEMATOCRIT: 23.7 % — AB (ref 39.0–52.0)
HEMOGLOBIN: 8 g/dL — AB (ref 13.0–17.0)
MCH: 32.1 pg (ref 26.0–34.0)
MCHC: 33.8 g/dL (ref 30.0–36.0)
MCV: 95.2 fL (ref 78.0–100.0)
Platelets: 151 10*3/uL (ref 150–400)
RBC: 2.49 MIL/uL — AB (ref 4.22–5.81)
RDW: 18.2 % — ABNORMAL HIGH (ref 11.5–15.5)
WBC: 4.4 10*3/uL (ref 4.0–10.5)

## 2017-04-12 LAB — PROCALCITONIN: Procalcitonin: 0.47 ng/mL

## 2017-04-12 LAB — PHOSPHORUS: Phosphorus: 2.7 mg/dL (ref 2.5–4.6)

## 2017-04-12 LAB — MAGNESIUM: Magnesium: 1.5 mg/dL — ABNORMAL LOW (ref 1.7–2.4)

## 2017-04-12 MED ORDER — POTASSIUM CHLORIDE CRYS ER 20 MEQ PO TBCR
20.0000 meq | EXTENDED_RELEASE_TABLET | Freq: Every day | ORAL | Status: DC
  Administered 2017-04-12 – 2017-04-13 (×2): 20 meq via ORAL
  Filled 2017-04-12 (×2): qty 1

## 2017-04-12 MED ORDER — MAGNESIUM SULFATE 2 GM/50ML IV SOLN
2.0000 g | Freq: Once | INTRAVENOUS | Status: AC
  Administered 2017-04-12: 2 g via INTRAVENOUS
  Filled 2017-04-12: qty 50

## 2017-04-12 MED ORDER — SODIUM CHLORIDE 0.9 % IV SOLN
1.0000 g | Freq: Once | INTRAVENOUS | Status: AC
  Administered 2017-04-12: 1 g via INTRAVENOUS
  Filled 2017-04-12: qty 10

## 2017-04-12 MED ORDER — MAGNESIUM OXIDE 400 (241.3 MG) MG PO TABS
400.0000 mg | ORAL_TABLET | Freq: Every day | ORAL | Status: DC
  Administered 2017-04-13 – 2017-04-14 (×2): 400 mg via ORAL
  Filled 2017-04-12 (×2): qty 1

## 2017-04-12 MED ORDER — MAGNESIUM OXIDE 400 (241.3 MG) MG PO TABS: 400.0000 mg | ORAL_TABLET | Freq: Every day | ORAL | Status: DC

## 2017-04-12 MED ORDER — TRAZODONE HCL 50 MG PO TABS
50.0000 mg | ORAL_TABLET | Freq: Once | ORAL | Status: AC
  Administered 2017-04-12: 50 mg via ORAL
  Filled 2017-04-12: qty 1

## 2017-04-12 MED ORDER — FERROUS SULFATE 325 (65 FE) MG PO TABS
325.0000 mg | ORAL_TABLET | Freq: Every day | ORAL | Status: DC
  Administered 2017-04-13 – 2017-04-16 (×4): 325 mg via ORAL
  Filled 2017-04-12 (×4): qty 1

## 2017-04-12 NOTE — Progress Notes (Signed)
PROGRESS NOTE    Patrick [name]  SWN:[id number] DOB: [birth year] DOA: 04/09/2017 PCP: Patrick Post, MD      Brief Narrative:  Patrick [name] is a 67 yo M with past medical history significant for alcoholism and esophagitis who presents with 2-3 months progressive weakness, buttock ulcers.  Given IV fluids and BP dropped, unclear cause, resolved after 2 days with fluids and Levophed.  Started on IV antibiotics in ICU for possible aspiration event.     Assessment & Plan:  Principal Problem:   Hypokalemia Active Problems:   Pressure ulcer of sacral region, stage 2   Alcohol abuse   Hypotension   Hypomagnesemia   FTT (failure to thrive) in adult   GI bleed   Palliative care by specialist   DNR (do not resuscitate)   Protein-calorie malnutrition, severe   Failure to thrive Generalized Weakness Severe protein calorie malnutrition Weakness from anemia, hypokalemia/hypoMg/hypoCa, and severe protein calorie malnutrition.  Concern for colon cancer raised by GI, they plan colonoscopy as outpatient. -Consult dietitian -Nutritional supplement ordered   Hypokalemia Hypomagnesemia Hypocalcemia -IV magnesium ordered and continue oral Mag for 3 days -IV calcium ordered -Continue oral K  Anemia of chronic blood loss Normocytic.  Hgb dropped to 5 while on fluids, presumably dilutional.  Was transfused 2 units here.  Now back to ~8-9 g/dL.  Folate and B12 normal last Oct. -Check Ferritin -Start iron  Esophagitis -GI evaluated, recommended PPI, iron -Follow up in place -Continue PPI  Alcoholism -Continue CIWA scoring with lorazepam -Continue thiamine and folate -SW consult  Aspiration event Possible pneumonia Agree with CCM, will discontinue antibiotics tonight and monitor off antibiotics. -Continue Zosyn today last day        DVT prophylaxis: SCDs Code Status: DO NOT RESUSCITATE Family Communication: None present Disposition Plan: SNF if able.  Likely will have to  discharge to home, given patinet without insurance.  At present, still requires IV electrolyte replacement, IV antibiotics.  Likely home Saturday or Sunday.   Consultants:   CCM  GI  Procedures:   None  Antimicrobials:  Zosyn 1/1 >> Azithromycin 1/1 >> 1/2      Subjective: Feels tired.  Pain in rear and and weakness are unchanged.  He has no new fever, confusion.  He has no cough, chest pain.    Objective: Vitals:   04/12/17 0225 04/12/17 0504 04/12/17 0906 04/12/17 1014  BP:  102/67 104/80   Pulse:  89 80   Resp:  16 18   Temp:  (!) 97.5 F (36.4 C) 97.6 F (36.4 C)   TempSrc:   Oral   SpO2:  94% 100% 98%  Weight: 59 kg (130 lb 1.1 oz)     Height:        Intake/Output Summary (Last 24 hours) at 04/12/2017 1513 Last data filed at 04/12/2017 1403 Gross per 24 hour  Intake 1219.55 ml  Output 350 ml  Net 869.55 ml   Filed Weights   04/11/17 0400 04/11/17 2042 04/12/17 0225  Weight: 60.5 kg (133 lb 6.1 oz) 59 kg (130 lb) 59 kg (130 lb 1.1 oz)    Examination: General appearance:  adult male, alert and in no acute distress.   HEENT: Anicteric, conjunctiva pink, lids and lashes normal. No nasal deformity, discharge, epistaxis.  Lips moist.   Skin: Warm and dry.  Pale, no jaundice.  There are some stage II ulcers of his buttocks.  Cardiac: RRR, nl S1-S2, no murmurs appreciated.  Capillary refill is brisk.  JVP not visible.  No LE edema.  Radial pulses 2+ and symmetric. Respiratory: Normal respiratory rate and rhythm.  CTAB without rales or wheezes. Abdomen: Abdomen soft.  No TTP. No ascites, distension, hepatosplenomegaly.   MSK: No deformities or effusions.  Severe diffuse loss of muscle mass. Neuro: Pupils are 3 mm and reactive to 2 mm.  Extraocular movements are intact, without nystagmus.  Cranial nerve 5 is within normal limits.  Cranial nerve 7 is symmetrical.  Cranial nerve 8 is within normal limits.  Cranial nerves 9 and 10 reveal equal palate elevation.  Cranial  nerve 11 reveals sternocleidomastoid strong.  Cranial nerve 12 is midline.  Motor strength testing is 4/5 in the upper and lower extremities bilaterally with mild tremor, symmetry, decreased muscle bulk.  Sensory examination is intact to light touch.  The patient is oriented to time, place and person.  Speech is fluent.  Recall, recent and remote, as well as general fund of knowledge seem within normal limits.  Attention span and concentration are within normal limits. Psych: Sensorium intact and responding to questions, attention normal. Affect blunt.  Judgment and insight appear normal.    Data Reviewed: I have personally reviewed following labs and imaging studies:  CBC: Recent Labs  Lab 04/09/17 1620 04/09/17 1659 04/10/17 0429 04/10/17 1624 04/11/17 0401 04/12/17 0435  WBC 7.4  --  5.6  --  5.1 4.4  NEUTROABS 4.7  --   --   --   --   --   HGB 8.2* 8.5* 5.0* 9.2* 8.5* 8.0*  HCT 24.1* 25.0* 15.2* 27.1* 24.9* 23.7*  MCV 94.5  --  96.8  --  94.0 95.2  PLT 258  --  169  --  185 250   Basic Metabolic Panel: Recent Labs  Lab 04/09/17 1620 04/09/17 1659 04/10/17 0429 04/11/17 0401 04/12/17 0435  NA 129* 128* 133* 135 138  K 2.1* 2.3* 3.4* 3.0* 3.3*  CL 83* 80* 103 109 112*  CO2 30  --  26 20* 21*  GLUCOSE 98 101* 123* 117* 71  BUN 5* 4* 11 11 6   CREATININE 0.66 0.60* 0.71 0.80 0.79  CALCIUM 7.8*  --  6.7* 7.2* 7.7*  MG 1.1*  --  1.5* 1.8 1.5*  PHOS  --   --   --  2.4* 2.7   GFR: Estimated Creatinine Clearance: 77.8 mL/min (by C-G formula based on SCr of 0.79 mg/dL). Liver Function Tests: Recent Labs  Lab 04/09/17 1620  AST 57*  ALT 28  ALKPHOS 106  BILITOT 1.3*  PROT 6.3*  ALBUMIN 2.4*   No results for input(s): LIPASE, AMYLASE in the last 168 hours. Recent Labs  Lab 04/10/17 1610  AMMONIA 18   Coagulation Profile: Recent Labs  Lab 04/09/17 1620  INR 1.09   Cardiac Enzymes: Recent Labs  Lab 04/09/17 1620 04/10/17 1624 04/11/17 0037 04/11/17 0401    CKTOTAL 55  --   --   --   TROPONINI <0.03 <0.03 <0.03 <0.03   BNP (last 3 results) No results for input(s): PROBNP in the last 8760 hours. HbA1C: No results for input(s): HGBA1C in the last 72 hours. CBG: No results for input(s): GLUCAP in the last 168 hours. Lipid Profile: No results for input(s): CHOL, HDL, LDLCALC, TRIG, CHOLHDL, LDLDIRECT in the last 72 hours. Thyroid Function Tests: No results for input(s): TSH, T4TOTAL, FREET4, T3FREE, THYROIDAB in the last 72 hours. Anemia Panel: No results for input(s): VITAMINB12, FOLATE, FERRITIN, TIBC, IRON, RETICCTPCT in the  last 72 hours. Urine analysis:    Component Value Date/Time   COLORURINE YELLOW 04/10/2017 1200   APPEARANCEUR CLEAR 04/10/2017 1200   LABSPEC 1.015 04/10/2017 1200   PHURINE 6.0 04/10/2017 1200   GLUCOSEU NEGATIVE 04/10/2017 1200   HGBUR NEGATIVE 04/10/2017 1200   HGBUR negative 02/21/2010 [place] 04/10/2017 1200   BILIRUBINUR n 03/11/2014 1025   KETONESUR NEGATIVE 04/10/2017 1200   PROTEINUR NEGATIVE 04/10/2017 1200   UROBILINOGEN 0.2 03/11/2014 1025   UROBILINOGEN 0.2 02/21/2010 1055   NITRITE NEGATIVE 04/10/2017 1200   LEUKOCYTESUR NEGATIVE 04/10/2017 1200   Sepsis Labs: @LABRCNTIP (procalcitonin:4,lacticacidven:4)  ) Recent Results (from the past 240 hour(s))  Urine culture     Status: None   Collection Time: 04/10/17 12:42 PM  Result Value Ref Range Status   Specimen Description URINE, CATHETERIZED  Final   Special Requests Normal  Final   Culture NO GROWTH  Final   Report Status 04/11/2017 FINAL  Final  MRSA PCR Screening     Status: None   Collection Time: 04/10/17  2:00 PM  Result Value Ref Range Status   MRSA by PCR NEGATIVE NEGATIVE Final    Comment:        The GeneXpert MRSA Assay (FDA approved for NASAL specimens only), is one component of a comprehensive MRSA colonization surveillance program. It is not intended to diagnose MRSA infection nor to guide  or monitor treatment for MRSA infections.   Culture, blood (routine x 2)     Status: None (Preliminary result)   Collection Time: 04/10/17  8:50 PM  Result Value Ref Range Status   Specimen Description BLOOD LEFT ANTECUBITAL  Final   Special Requests IN PEDIATRIC BOTTLE Blood Culture adequate volume  Final   Culture NO GROWTH 2 DAYS  Final   Report Status PENDING  Incomplete  Culture, blood (routine x 2)     Status: None (Preliminary result)   Collection Time: 04/10/17  8:50 PM  Result Value Ref Range Status   Specimen Description BLOOD RIGHT HAND  Final   Special Requests IN PEDIATRIC BOTTLE Blood Culture adequate volume  Final   Culture NO GROWTH 2 DAYS  Final   Report Status PENDING  Incomplete         Radiology Studies: Dg Chest Port 1 View  Result Date: 04/12/2017 CLINICAL DATA:  Possible aspiration. Recent episodes of nausea and vomiting. EXAM: PORTABLE CHEST 1 VIEW COMPARISON:  Chest x-ray of April 10, 2017 and PA and lateral chest x-ray of April 09, 2017. FINDINGS: The lungs are well-expanded. There is increased density in the right infrahilar region likely in the lower lobe. The perihilar lung markings inferiorly on the left are increased as well. The heart and pulmonary vascularity are normal. There is no significant pleural effusion. There is calcification in the wall of the aortic arch. The bony thorax exhibits no acute abnormality. IMPRESSION: Increased densities likely in the lower lobes bilaterally worrisome for pneumonia, possibly related to aspiration. Thoracic aortic atherosclerosis. Electronically Signed   By: David  Martinique M.D.   On: 04/12/2017 07:03        Scheduled Meds: . chlorhexidine  15 mL Mouth Rinse BID  . Chlorhexidine Gluconate Cloth  6 each Topical Daily  . feeding supplement (ENSURE ENLIVE)  237 mL Oral BID BM  . folic acid  1 mg Oral Daily  . ipratropium-albuterol  3 mL Nebulization BID  . mouth rinse  15 mL Mouth Rinse q12n4p  . metoprolol  tartrate  12.5 mg Oral BID  . multivitamin with minerals  1 tablet Oral Daily  . nicotine  21 mg Transdermal Daily  . pantoprazole (PROTONIX) IV  40 mg Intravenous Q12H  . sodium chloride flush  10-40 mL Intracatheter Q12H  . thiamine  100 mg Oral Daily   Continuous Infusions: . piperacillin-tazobactam Stopped (04/12/17 1014)     LOS: 2 days    Time spent: 63 minutes    Edwin Dada, MD [facility] 04/12/2017, 3:13 PM     Pager [phone] --- please page though AMION:  [email or web address] If 7PM-7AM, please contact night-coverage

## 2017-04-12 NOTE — Evaluation (Signed)
Physical Therapy Evaluation Patient Details Name: Patrick [name] MRN: [id number] DOB: 1950-07-11 Today's Date: 04/12/2017   History of Present Illness  Pt is a 67 y/o male admitted secondary to weakness and found to be hypokalemic. PMH including but not limited to chronic alcohol abuse.   Clinical Impression  Pt presented supine in bed with HOB elevated, awake and willing to participate in therapy session. Prior to admission, pt reported that he has not been ambulating and has mostly been laying in bed or on the couch. Pt reported that he has not been bathing and has stayed in the same clothes "for a while". Pt currently very limited secondary to weakness and fatigue. Pt requires supervision for bed mobility, min A for transfers and min A with use of RW to take a few side steps at EOB. Pt would continue to benefit from skilled physical therapy services at this time while admitted and after d/c to address the below listed limitations in order to improve overall safety and independence with functional mobility.     Follow Up Recommendations SNF;Supervision/Assistance - 24 hour    Equipment Recommendations  Wheelchair (measurements PT);Wheelchair cushion (measurements PT)    Recommendations for Other Services       Precautions / Restrictions Precautions Precautions: Fall Restrictions Weight Bearing Restrictions: No      Mobility  Bed Mobility Overal bed mobility: Needs Assistance Bed Mobility: Supine to Sit;Sit to Supine     Supine to sit: Supervision Sit to supine: Supervision   General bed mobility comments: increased time, supervision for safety  Transfers Overall transfer level: Needs assistance Equipment used: Rolling walker (2 wheeled) Transfers: Sit to/from Stand Sit to Stand: Min assist;From elevated surface         General transfer comment: increased effort, good technique, min A for stability and to power into standing from elevated bed position.    Ambulation/Gait             General Gait Details: pt only able to take 3-4 side steps at EOB to his L side with RW and min A   Stairs            Wheelchair Mobility    Modified Rankin (Stroke Patients Only)       Balance Overall balance assessment: Needs assistance Sitting-balance support: Feet supported Sitting balance-Leahy Scale: Good     Standing balance support: During functional activity;Bilateral upper extremity supported Standing balance-Leahy Scale: Poor Standing balance comment: reliant on bilateral UEs on RW and min A; pt quickly fatiguing after standing for ~30 seconds with bilateral LE's trembling                             Pertinent Vitals/Pain Pain Assessment: Faces Faces Pain Scale: Hurts little more Pain Location: buttock Pain Descriptors / Indicators: Sore Pain Intervention(s): Monitored during session    Home Living Family/patient expects to be discharged to:: Private residence Living Arrangements: Children Available Help at Discharge: Family Type of Home: House Home Access: Stairs to enter   Technical brewer of Steps: 3 Home Layout: One level Home Equipment: Environmental consultant - 2 wheels      Prior Function Level of Independence: Needs assistance   Gait / Transfers Assistance Needed: pt reported that he has not been ambulating in months and has mostly been laying in bed or on the couch  ADL's / Homemaking Assistance Needed: pt reported that he has not bathed and has been wearing the  same clothes for "a while"        Hand Dominance        Extremity/Trunk Assessment   Upper Extremity Assessment Upper Extremity Assessment: Generalized weakness    Lower Extremity Assessment Lower Extremity Assessment: Generalized weakness       Communication   Communication: No difficulties  Cognition Arousal/Alertness: Awake/alert Behavior During Therapy: Flat affect Overall Cognitive Status: Impaired/Different from  baseline Area of Impairment: Safety/judgement;Problem solving                         Safety/Judgement: Decreased awareness of safety   Problem Solving: Difficulty sequencing;Requires verbal cues;Requires tactile cues        General Comments      Exercises     Assessment/Plan    PT Assessment Patient needs continued PT services  PT Problem List Decreased strength;Decreased activity tolerance;Decreased balance;Decreased mobility;Decreased coordination;Decreased safety awareness       PT Treatment Interventions DME instruction;Gait training;Stair training;Functional mobility training;Therapeutic activities;Therapeutic exercise;Balance training;Neuromuscular re-education;Patient/family education    PT Goals (Current goals can be found in the Care Plan section)  Acute Rehab PT Goals Patient Stated Goal: improve strength PT Goal Formulation: With patient Time For Goal Achievement: 04/26/17 Potential to Achieve Goals: Fair    Frequency Min 3X/week   Barriers to discharge Decreased caregiver support      Co-evaluation               AM-PAC PT "6 Clicks" Daily Activity  Outcome Measure Difficulty turning over in bed (including adjusting bedclothes, sheets and blankets)?: None Difficulty moving from lying on back to sitting on the side of the bed? : None Difficulty sitting down on and standing up from a chair with arms (e.g., wheelchair, bedside commode, etc,.)?: Unable Help needed moving to and from a bed to chair (including a wheelchair)?: A Little Help needed walking in hospital room?: A Lot Help needed climbing 3-5 steps with a railing? : Total 6 Click Score: 15    End of Session Equipment Utilized During Treatment: Gait belt Activity Tolerance: Patient limited by fatigue Patient left: in bed;with call bell/phone within reach;with bed alarm set Nurse Communication: Mobility status PT Visit Diagnosis: Other abnormalities of gait and mobility  (R26.89);Difficulty in walking, not elsewhere classified (R26.2)    Time: 1036-1100 PT Time Calculation (min) (ACUTE ONLY): 24 min   Charges:   PT Evaluation $PT Eval Moderate Complexity: 1 Mod PT Treatments $Therapeutic Activity: 8-22 mins   PT G Codes:        [place], PT, DPT [place] 04/12/2017, 2:07 PM

## 2017-04-12 NOTE — Progress Notes (Signed)
[facility] Progress Note  Chief Complaint:    Recurrent anemia  Subjective: No overt GI bleeding. His only complaint is regarding severe weakness in his legs and rash on buttocks  Objective:  Vital signs in last 24 hours: Temp:  [97.4 F (36.3 C)-98.9 F (37.2 C)] 97.6 F (36.4 C) (01/03 0906) Pulse Rate:  [80-112] 80 (01/03 0906) Resp:  [14-22] 18 (01/03 0906) BP: (84-117)/(57-80) 104/80 (01/03 0906) SpO2:  [93 %-100 %] 100 % (01/03 0906) Weight:  [130 lb (59 kg)-130 lb 1.1 oz (59 kg)] 130 lb 1.1 oz (59 kg) (01/03 0225) Last BM Date: 04/11/17 General:   Alert, thin white male in NAD EENT:  Normal hearing, non icteric sclera, conjunctive pink.  Heart:  Regular rate and rhythm, no lower extremity edema Pulm: Normal respiratory effort. Coarse breath sounds bilaterally Abdomen:  Soft, nondistended, nontender.  Normal bowel sounds, no masses felt. No hepatomegaly.    Neurologic:  Alert and  oriented x4;  grossly normal neurologically. Psych:  Pleasant, cooperative.  Normal mood and affect.   Intake/Output from previous day: 01/02 0701 - 01/03 0700 In: 1189 [P.O.:600; I.V.:284.4; IV Piggyback:304.6] Out: 575 [Urine:575] Intake/Output this shift: Total I/O In: -  Out: 50 [Urine:50]  Lab Results: Recent Labs    04/10/17 0429 04/10/17 1624 04/11/17 0401 04/12/17 0435  WBC 5.6  --  5.1 4.4  HGB 5.0* 9.2* 8.5* 8.0*  HCT 15.2* 27.1* 24.9* 23.7*  PLT 169  --  185 151   BMET Recent Labs    04/10/17 0429 04/11/17 0401 04/12/17 0435  NA 133* 135 138  K 3.4* 3.0* 3.3*  CL 103 109 112*  CO2 26 20* 21*  GLUCOSE 123* 117* 71  BUN 11 11 6   CREATININE 0.71 0.80 0.79  CALCIUM 6.7* 7.2* 7.7*   LFT Recent Labs    04/09/17 1620  PROT 6.3*  ALBUMIN 2.4*  AST 57*  ALT 28  ALKPHOS 106  BILITOT 1.3*   PT/INR Recent Labs    04/09/17 1620  LABPROT 14.0  INR 1.09   Dg Chest Port 1 View  Result Date: 04/12/2017 CLINICAL DATA:  Possible  aspiration. Recent episodes of nausea and vomiting. EXAM: PORTABLE CHEST 1 VIEW COMPARISON:  Chest x-ray of April 10, 2017 and PA and lateral chest x-ray of April 09, 2017. FINDINGS: The lungs are well-expanded. There is increased density in the right infrahilar region likely in the lower lobe. The perihilar lung markings inferiorly on the left are increased as well. The heart and pulmonary vascularity are normal. There is no significant pleural effusion. There is calcification in the wall of the aortic arch. The bony thorax exhibits no acute abnormality. IMPRESSION: Increased densities likely in the lower lobes bilaterally worrisome for pneumonia, possibly related to aspiration. Thoracic aortic atherosclerosis. Electronically Signed   By: David  Martinique M.D.   On: 04/12/2017 07:03   Dg Chest Port 1 View  Result Date: 04/10/2017 CLINICAL DATA:  Pneumonia EXAM: PORTABLE CHEST 1 VIEW COMPARISON:  04/09/2017 FINDINGS: 1118 hours. The lungs are clear without focal pneumonia, edema, pneumothorax or pleural effusion. No The cardiopericardial silhouette is within normal limits for size. Small to moderate hiatal hernia. The visualized bony structures of the thorax are intact. Telemetry leads overlie the chest. IMPRESSION: No active disease. Electronically Signed   By: Misty Stanley M.D.   On: 04/10/2017 11:40    ASSESSMENT / PLAN:   Recurrent severe [place] anemia ( hgb mid 8 >>> 5.0), heme  positive stools probably secondary to known severe esophagitis. No overt GI bleeding. Received 2 units of blood two days ago with more than overzealous rise in hgb. Slight drift overnight from 8.5 to 8.0 but probably equilibration.   -reiterated need for daily PPI at home. Would be helpful if he could keep HOB elevated at least 30 degrees at all times since he is spending a lot of time in bed right now.  -I am going to make him a follow up with me to check on him and make a decision about feasibility of surveillance colonoscopy.    -continue home iron  Discharge Planning Diet: regular Anticoagulation and antiplatelets: N/A Discharge Medications: Daily PPI, continue home iron Follow up: 05/08/17 - in Epic  Principal Problem:   Hypokalemia Active Problems:   Pressure ulcer of sacral region, stage 2   Alcohol abuse   Hypotension   Hypomagnesemia   FTT (failure to thrive) in adult   GI bleed   Palliative care by specialist   DNR (do not resuscitate)     LOS: 2 days   Tye Savoy ,NP 04/12/2017, 9:30 AM  Pager number [phone]

## 2017-04-12 NOTE — Progress Notes (Signed)
Initial Nutrition Assessment  DOCUMENTATION CODES:   Severe malnutrition in context of chronic illness  INTERVENTION:   Ensure Enlive po BID, each supplement provides 350 kcal and 20 grams of protein  Magic cup BID with meals, each supplement provides 290 kcal and 9 grams of protein  Continue MVI, thiamine, folic acid supplementation.   NUTRITION DIAGNOSIS:   Severe Malnutrition related to chronic illness(chronic alcoholism with complications, FTT, korsakoff's syndrome, severe erosive esophagitis) as evidenced by severe fat depletion, severe muscle depletion.  GOAL:   Patient will meet greater than or equal to 90% of their needs  MONITOR:   PO intake, Labs, Weight trends, Supplement acceptance, Skin  REASON FOR ASSESSMENT:   Malnutrition Screening Tool, Consult Assessment of nutrition requirement/status  ASSESSMENT:   67 yo male admitted with generalized weakness and poor po intake, found to have hypokalemia, severe anemia.  Pt with hx of chronic alcoholism, chronic encephalopathy, FTT, not ambulatory for past month.. Pt continues to drink but not as much per report. Pt with additional hx of depression, diverticulitis, grade D esophagitis, dysphagia, GERD  Pt with multiple admissions for metabolic derrangments, weakness, hypovolemia; concerns over poor social/living situation as pt usually found on the floor covered in feces/urine  No overt signs of GI bleeding, GI following. Palliative care following as well.  Abdomen distended in setting of chronic alcoholism, ?ascites?Marland Kitchen   Noted pt with hx of dysphagia; last seen by SLP on 12/2016 and placed on Regular diet with Thin Liquids.   Recorded po intake 75-100% of meals; pt also receiving Ensure and reports he drank 3/4 of it this AM Pt reports he typically eats one meal per day at home; reports this is usually take out or a frozen meal. Pt does consume alcohol daily  Electrolytes being supplements; recommend continuing to  check and supplement as needed as pt is at risk for refeeding  Pt believes he has lost weight but unsure how much. Per weight encounters, weight is up since last admission. Noted some edema.   Labs: potassium 3.3, magnesium 1.5, phosphorus wdl Meds: MVI, thiamine, folic acid, mag sulfate  NUTRITION - FOCUSED PHYSICAL EXAM:    Most Recent Value  Orbital Region  Severe depletion  Upper Arm Region  Severe depletion  Thoracic and Lumbar Region  Unable to assess [abdominal distention, ?ascites]  Buccal Region  Moderate depletion  Temple Region  Severe depletion  Clavicle Bone Region  Severe depletion  Clavicle and Acromion Bone Region  Severe depletion  Scapular Bone Region  Severe depletion  Dorsal Hand  Moderate depletion  Patellar Region  Moderate depletion  Anterior Thigh Region  Moderate depletion  Posterior Calf Region  Moderate depletion  Edema (RD Assessment)  None       Diet Order:  Diet regular Room service appropriate? Yes; Fluid consistency: Thin  EDUCATION NEEDS:   Not appropriate for education at this time  Skin:  Skin Assessment: Skin Integrity Issues: Skin Integrity Issues:: Stage II Stage II: sacrum x 2  Last BM:  1/2  Height:   Ht Readings from Last 1 Encounters:  04/11/17 5\' 7"  (1.702 m)    Weight:   Wt Readings from Last 1 Encounters:  04/12/17 130 lb 1.1 oz (59 kg)    Ideal Body Weight:     BMI:  Body mass index is 20.37 kg/m.  Estimated Nutritional Needs:   Kcal:  4193-7902 kcals  Protein:  90-105 g  Fluid:  >/= 1.8 L   Kerman Passey MS, RD,  [place], [place] [phone] Pager  [phone] Weekend/On-Call Pager

## 2017-04-12 NOTE — Progress Notes (Signed)
PT Cancellation Note  Patient Details Name: Patrick [name] MRN: [id number] DOB: 05-Feb-1951   Cancelled Treatment:    Reason Eval/Treat Not Completed: Patient not medically ready. Pt's temporary femoral catheter in place. PT will continue to f/u with pt as available and when pt is appropriate for full participation in PT evaluation.    [place] 04/12/2017, 8:10 AM

## 2017-04-12 NOTE — Progress Notes (Signed)
   04/12/17 1000  Clinical Encounter Type  Visited With Patient  Visit Type Initial  Referral From Nurse  Consult/Referral To Chaplain  Spiritual Encounters  Spiritual Needs Emotional  Stress Factors  Patient Stress Factors Exhausted        Patient was in his bed with an oxygen mask on. No family present on-site. Medical staff on-site giving oxygen Patient able to communicate in low voice and said would love to be visited later. Told charge nurse to page chaplain back when patient available and not having oxygen mask. I provided compassionate presence and encouragement.  Mozetta Murfin a Medical sales representative, [employer]

## 2017-04-13 ENCOUNTER — Other Ambulatory Visit: Payer: Self-pay

## 2017-04-13 ENCOUNTER — Inpatient Hospital Stay (HOSPITAL_COMMUNITY): Payer: Self-pay

## 2017-04-13 LAB — COMPREHENSIVE METABOLIC PANEL
ALT: 37 U/L (ref 17–63)
AST: 95 U/L — ABNORMAL HIGH (ref 15–41)
Albumin: 1.9 g/dL — ABNORMAL LOW (ref 3.5–5.0)
Alkaline Phosphatase: 333 U/L — ABNORMAL HIGH (ref 38–126)
Anion gap: 7 (ref 5–15)
BUN: 5 mg/dL — ABNORMAL LOW (ref 6–20)
CALCIUM: 8.7 mg/dL — AB (ref 8.9–10.3)
CO2: 21 mmol/L — ABNORMAL LOW (ref 22–32)
CREATININE: 0.71 mg/dL (ref 0.61–1.24)
Chloride: 108 mmol/L (ref 101–111)
Glucose, Bld: 104 mg/dL — ABNORMAL HIGH (ref 65–99)
Potassium: 4.8 mmol/L (ref 3.5–5.1)
Sodium: 136 mmol/L (ref 135–145)
Total Bilirubin: 1.7 mg/dL — ABNORMAL HIGH (ref 0.3–1.2)
Total Protein: 5.4 g/dL — ABNORMAL LOW (ref 6.5–8.1)

## 2017-04-13 LAB — BASIC METABOLIC PANEL
Anion gap: 3 — ABNORMAL LOW (ref 5–15)
BUN: 5 mg/dL — ABNORMAL LOW (ref 6–20)
CO2: 24 mmol/L (ref 22–32)
CREATININE: 0.8 mg/dL (ref 0.61–1.24)
Calcium: 8.2 mg/dL — ABNORMAL LOW (ref 8.9–10.3)
Chloride: 109 mmol/L (ref 101–111)
GLUCOSE: 84 mg/dL (ref 65–99)
Potassium: 4 mmol/L (ref 3.5–5.1)
Sodium: 136 mmol/L (ref 135–145)

## 2017-04-13 LAB — CBC
HCT: 30 % — ABNORMAL LOW (ref 39.0–52.0)
HEMATOCRIT: 23.6 % — AB (ref 39.0–52.0)
Hemoglobin: 7.6 g/dL — ABNORMAL LOW (ref 13.0–17.0)
Hemoglobin: 9.7 g/dL — ABNORMAL LOW (ref 13.0–17.0)
MCH: 31.3 pg (ref 26.0–34.0)
MCH: 32 pg (ref 26.0–34.0)
MCHC: 32.2 g/dL (ref 30.0–36.0)
MCHC: 32.3 g/dL (ref 30.0–36.0)
MCV: 97.1 fL (ref 78.0–100.0)
MCV: 99 fL (ref 78.0–100.0)
PLATELETS: 158 10*3/uL (ref 150–400)
PLATELETS: 199 10*3/uL (ref 150–400)
RBC: 2.43 MIL/uL — ABNORMAL LOW (ref 4.22–5.81)
RBC: 3.03 MIL/uL — ABNORMAL LOW (ref 4.22–5.81)
RDW: 18.6 % — AB (ref 11.5–15.5)
RDW: 18.6 % — AB (ref 11.5–15.5)
WBC: 4.9 10*3/uL (ref 4.0–10.5)
WBC: 7.8 10*3/uL (ref 4.0–10.5)

## 2017-04-13 LAB — FERRITIN: FERRITIN: 837 ng/mL — AB (ref 24–336)

## 2017-04-13 LAB — IRON AND TIBC: Iron: 33 ug/dL — ABNORMAL LOW (ref 45–182)

## 2017-04-13 LAB — MAGNESIUM: MAGNESIUM: 1.4 mg/dL — AB (ref 1.7–2.4)

## 2017-04-13 MED ORDER — METOPROLOL TARTRATE 5 MG/5ML IV SOLN
2.5000 mg | Freq: Once | INTRAVENOUS | Status: AC
  Administered 2017-04-13: 2.5 mg via INTRAVENOUS
  Filled 2017-04-13: qty 5

## 2017-04-13 MED ORDER — MAGNESIUM SULFATE 2 GM/50ML IV SOLN
2.0000 g | Freq: Once | INTRAVENOUS | Status: AC
  Administered 2017-04-13: 2 g via INTRAVENOUS
  Filled 2017-04-13: qty 50

## 2017-04-13 MED ORDER — SODIUM CHLORIDE 0.9 % IV BOLUS (SEPSIS)
1000.0000 mL | Freq: Once | INTRAVENOUS | Status: AC
  Administered 2017-04-13: 1000 mL via INTRAVENOUS

## 2017-04-13 MED ORDER — ACETAMINOPHEN 325 MG PO TABS
650.0000 mg | ORAL_TABLET | Freq: Four times a day (QID) | ORAL | Status: DC | PRN
  Administered 2017-04-13: 650 mg via ORAL
  Filled 2017-04-13: qty 2

## 2017-04-13 NOTE — Progress Notes (Addendum)
Refuses sacral foam, refuses to be repositioned.

## 2017-04-13 NOTE — Care Management Note (Addendum)
Case Management Note  Patient Details  Name: Patrick [name] MRN: [id number] Date of Birth: 1950/10/03  Subjective/Objective:      Hypokalemia, ETOH, hypotension, GI bleed, FTT              Action/Plan: Discharge Planning: NCM spoke to pt at bedside. Does not have any insurance. States he plans to apply for disability. States he lives with son, Patrick [name] [phone]. Provided pt with coupon for Protonix at [facility] $13. Pt states he can afford medication. Arranged appt for [facility] for 1/11 at 9 am. Appt scheduled for [facility] for 1/29 at 930 am. Attempted call to son but no voicemail. [place] RN arranged with [facility] charity program. [facility] will follow up with pt to see if he qualifies. Pt states he has RW at home. Gave permission to speak with son or ex wife, Patrick [name]. NCM contacted pt's ex wife, Patrick [name]. States she may be able to provide transportation home or son, Patrick [name].   [place] Liaison went in to speak with pt and he declined their services.    Expected Discharge Date:                  Expected Discharge Plan:  [place]  In-House Referral:  NA  Discharge planning Services  CM Consult, Follow-up appt scheduled, Medication Assistance  Post Acute Care Choice:  Home Health Choice offered to:  Patient  DME Arranged:  N/A DME Agency:  NA  HH Arranged:  RN [place] Agency:  [place]  Status of Service:  Completed, signed off  If discussed at [place] of Stay Meetings, dates discussed:    Additional Comments:  Erenest Rasher, RN 04/13/2017, 3:15 PM

## 2017-04-13 NOTE — Progress Notes (Signed)
PROGRESS NOTE    CORNELL BOURBON  ENI:[id number] DOB: 01-May-1950 DOA: 04/09/2017 PCP: [name], MD      Brief Narrative:  Mr Junker is a 67 yo M with past medical history significant for alcoholism and esophagitis who presents with 2-3 months progressive weakness, buttock ulcers.  Given IV fluids and BP dropped, unclear cause, resolved after 2 days with fluids and Levophed.  Started on IV antibiotics in ICU for possible aspiration event.     Assessment & Plan:  Principal Problem:   Hypokalemia Active Problems:   Pressure ulcer of sacral region, stage 2   Alcohol abuse   Hypotension   Hypomagnesemia   FTT (failure to thrive) in adult   GI bleed   Palliative care by specialist   DNR (do not resuscitate)   Protein-calorie malnutrition, severe   Failure to thrive Generalized Weakness Severe protein calorie malnutrition Weakness from anemia, hypokalemia/hypoMg/hypoCa, and severe protein calorie malnutrition.  Concern for colon cancer raised by GI, they plan colonoscopy as outpatient. -Consult dietitian -Nutritional supplement ordered   Hypokalemia Hypomagnesemia Hypocalcemia Mag and Ca better.   -Continue oral K  Anemia of chronic blood loss Normocytic.  Hgb dropped to 5 while on fluids, presumably dilutional.  Was transfused 2 units here.  Now back to ~8-9 g/dL.  Folate and B12 normal last Oct. Ferritin up but iron concentration down.   -Continue iron -Patient understands need for outpatient colonoscopy  Esophagitis -GI evaluated, recommended PPI, iron -Follow up in place -Continue PPI  Alcoholism -Continue CIWA scoring with lorazepam -Continue thiamine and folate -SW consult  Aspiration event Possible pneumonia Agree with CCM, will discontinue antibiotics tonight and monitor off antibiotics.         DVT prophylaxis: SCDs Code Status: DO NOT RESUSCITATE Family Communication: None present Disposition Plan: SNF if able.  Likely will have to  discharge to home, given patinet without insurance.  At present, still requires IV electrolyte replacement, IV antibiotics.  Likely home Saturday or Sunday.   Consultants:   CCM  GI  Procedures:   None  Antimicrobials:  Zosyn 1/1 >> Azithromycin 1/1 >> 1/2      Subjective: Feels tired.  Weakness unchanged.  He has no new fever, confusion.  He has no cough, chest pain.    Objective: Vitals:   04/13/17 0524 04/13/17 0915 04/13/17 0925 04/13/17 1728  BP: 114/86  105/71 121/84  Pulse: (!) 115  (!) 115 (!) 101  Resp: 18  18 18   Temp: 99 F (37.2 C)  98.4 F (36.9 C) 98 F (36.7 C)  TempSrc: Oral  Oral Oral  SpO2: 94% 96% 98% 98%  Weight:      Height:        Intake/Output Summary (Last 24 hours) at 04/13/2017 1841 Last data filed at 04/13/2017 1300 Gross per 24 hour  Intake 960 ml  Output 0 ml  Net 960 ml   Filed Weights   04/11/17 2042 04/12/17 0225 04/12/17 2127  Weight: 59 kg (130 lb) 59 kg (130 lb 1.1 oz) 59.4 kg (130 lb 15.3 oz)    Examination: General appearance:  adult male, alert and in no acute distress.   HEENT: Anicteric, conjunctiva pink, lids and lashes normal. No nasal deformity, discharge, epistaxis.  Lips moist.   Skin: Warm and dry.  Pale, no jaundice.  There are some stage II ulcers of his buttocks.  Cardiac: RRR, nl S1-S2, no murmurs appreciated.  Capillary refill is brisk.  JVP not visible.  No LE edema.  Radial pulses 2+ and symmetric. Respiratory: Normal respiratory rate and rhythm.  CTAB without rales or wheezes. Abdomen: Abdomen soft.  No TTP. No ascites, distension, hepatosplenomegaly.   MSK: No deformities or effusions.  Severe diffuse loss of muscle mass. Neuro: Pupils are 3 mm and reactive to 2 mm.  Extraocular movements are intact, without nystagmus.  Cranial nerve 5 is within normal limits.  Cranial nerve 7 is symmetrical.  Cranial nerve 8 is within normal limits.  Cranial nerves 9 and 10 reveal equal palate elevation.  Cranial nerve 11  reveals sternocleidomastoid strong.  Cranial nerve 12 is midline.  Motor strength testing is 4/5 in the upper and lower extremities bilaterally with mild tremor, symmetry, decreased muscle bulk.  Sensory examination is intact to light touch.  The patient is oriented to time, place and person.  Speech is fluent.  Recall, recent and remote, as well as general fund of knowledge seem within normal limits.  Attention span and concentration are within normal limits. Psych: Sensorium intact and responding to questions, attention normal. Affect blunt.  Judgment and insight appear normal.    Data Reviewed: I have personally reviewed following labs and imaging studies:  CBC: Recent Labs  Lab 04/09/17 1620  04/10/17 0429 04/10/17 1624 04/11/17 0401 04/12/17 0435 04/13/17 0545  WBC 7.4  --  5.6  --  5.1 4.4 4.9  NEUTROABS 4.7  --   --   --   --   --   --   HGB 8.2*   < > 5.0* 9.2* 8.5* 8.0* 7.6*  HCT 24.1*   < > 15.2* 27.1* 24.9* 23.7* 23.6*  MCV 94.5  --  96.8  --  94.0 95.2 97.1  PLT 258  --  169  --  185 151 158   < > = values in this interval not displayed.   Basic Metabolic Panel: Recent Labs  Lab 04/09/17 1620 04/09/17 1659 04/10/17 0429 04/11/17 0401 04/12/17 0435 04/13/17 0545  NA 129* 128* 133* 135 138 136  K 2.1* 2.3* 3.4* 3.0* 3.3* 4.0  CL 83* 80* 103 109 112* 109  CO2 30  --  26 20* 21* 24  GLUCOSE 98 101* 123* 117* 71 84  BUN 5* 4* 11 11 6  5*  CREATININE 0.66 0.60* 0.71 0.80 0.79 0.80  CALCIUM 7.8*  --  6.7* 7.2* 7.7* 8.2*  MG 1.1*  --  1.5* 1.8 1.5*  --   PHOS  --   --   --  2.4* 2.7  --    GFR: Estimated Creatinine Clearance: 78.4 mL/min (by C-G formula based on SCr of 0.8 mg/dL). Liver Function Tests: Recent Labs  Lab 04/09/17 1620  AST 57*  ALT 28  ALKPHOS 106  BILITOT 1.3*  PROT 6.3*  ALBUMIN 2.4*   No results for input(s): LIPASE, AMYLASE in the last 168 hours. Recent Labs  Lab 04/10/17 1610  AMMONIA 18   Coagulation Profile: Recent Labs  Lab  04/09/17 1620  INR 1.09   Cardiac Enzymes: Recent Labs  Lab 04/09/17 1620 04/10/17 1624 04/11/17 0037 04/11/17 0401  CKTOTAL 55  --   --   --   TROPONINI <0.03 <0.03 <0.03 <0.03   BNP (last 3 results) No results for input(s): PROBNP in the last 8760 hours. HbA1C: No results for input(s): HGBA1C in the last 72 hours. CBG: No results for input(s): GLUCAP in the last 168 hours. Lipid Profile: No results for input(s): CHOL, HDL, LDLCALC, TRIG, CHOLHDL,  LDLDIRECT in the last 72 hours. Thyroid Function Tests: No results for input(s): TSH, T4TOTAL, FREET4, T3FREE, THYROIDAB in the last 72 hours. Anemia Panel: Recent Labs    04/13/17 0545  FERRITIN 837*  TIBC NOT CALCULATED  IRON 33*   Urine analysis:    Component Value Date/Time   COLORURINE YELLOW 04/10/2017 1200   APPEARANCEUR CLEAR 04/10/2017 1200   LABSPEC 1.015 04/10/2017 1200   PHURINE 6.0 04/10/2017 1200   GLUCOSEU NEGATIVE 04/10/2017 1200   HGBUR NEGATIVE 04/10/2017 1200   HGBUR negative 02/21/2010 1055   BILIRUBINUR NEGATIVE 04/10/2017 1200   BILIRUBINUR n 03/11/2014 1025   KETONESUR NEGATIVE 04/10/2017 1200   PROTEINUR NEGATIVE 04/10/2017 1200   UROBILINOGEN 0.2 03/11/2014 1025   UROBILINOGEN 0.2 02/21/2010 1055   NITRITE NEGATIVE 04/10/2017 1200   LEUKOCYTESUR NEGATIVE 04/10/2017 1200   Sepsis Labs: @LABRCNTIP (procalcitonin:4,lacticacidven:4)  ) Recent Results (from the past 240 hour(s))  Urine culture     Status: None   Collection Time: 04/10/17 12:42 PM  Result Value Ref Range Status   Specimen Description URINE, CATHETERIZED  Final   Special Requests Normal  Final   Culture NO GROWTH  Final   Report Status 04/11/2017 FINAL  Final  MRSA PCR Screening     Status: None   Collection Time: 04/10/17  2:00 PM  Result Value Ref Range Status   MRSA by PCR NEGATIVE NEGATIVE Final    Comment:        The GeneXpert MRSA Assay (FDA approved for NASAL specimens only), is one component of a comprehensive  MRSA colonization surveillance program. It is not intended to diagnose MRSA infection nor to guide or monitor treatment for MRSA infections.   Culture, blood (routine x 2)     Status: None (Preliminary result)   Collection Time: 04/10/17  8:50 PM  Result Value Ref Range Status   Specimen Description BLOOD LEFT ANTECUBITAL  Final   Special Requests IN PEDIATRIC BOTTLE Blood Culture adequate volume  Final   Culture NO GROWTH 3 DAYS  Final   Report Status PENDING  Incomplete  Culture, blood (routine x 2)     Status: None (Preliminary result)   Collection Time: 04/10/17  8:50 PM  Result Value Ref Range Status   Specimen Description BLOOD RIGHT HAND  Final   Special Requests IN PEDIATRIC BOTTLE Blood Culture adequate volume  Final   Culture NO GROWTH 3 DAYS  Final   Report Status PENDING  Incomplete         Radiology Studies: Dg Chest Port 1 View  Result Date: 04/12/2017 CLINICAL DATA:  Possible aspiration. Recent episodes of nausea and vomiting. EXAM: PORTABLE CHEST 1 VIEW COMPARISON:  Chest x-ray of April 10, 2017 and PA and lateral chest x-ray of April 09, 2017. FINDINGS: The lungs are well-expanded. There is increased density in the right infrahilar region likely in the lower lobe. The perihilar lung markings inferiorly on the left are increased as well. The heart and pulmonary vascularity are normal. There is no significant pleural effusion. There is calcification in the wall of the aortic arch. The bony thorax exhibits no acute abnormality. IMPRESSION: Increased densities likely in the lower lobes bilaterally worrisome for pneumonia, possibly related to aspiration. Thoracic aortic atherosclerosis. Electronically Signed   By: David  Martinique M.D.   On: 04/12/2017 07:03        Scheduled Meds: . chlorhexidine  15 mL Mouth Rinse BID  . Chlorhexidine Gluconate Cloth  6 each Topical Daily  . feeding supplement (  ENSURE ENLIVE)  237 mL Oral BID BM  . ferrous sulfate  325 mg Oral Q  breakfast  . folic acid  1 mg Oral Daily  . ipratropium-albuterol  3 mL Nebulization BID  . magnesium oxide  400 mg Oral Daily  . mouth rinse  15 mL Mouth Rinse q12n4p  . metoprolol tartrate  12.5 mg Oral BID  . multivitamin with minerals  1 tablet Oral Daily  . nicotine  21 mg Transdermal Daily  . pantoprazole (PROTONIX) IV  40 mg Intravenous Q12H  . potassium chloride  20 mEq Oral QHS  . thiamine  100 mg Oral Daily   Continuous Infusions:    LOS: 3 days    Time spent: 45 minutes    Edwin Dada, MD [facility] 04/13/2017, 6:41 PM     Pager [phone] --- please page though AMION:  [email or web address] If 7PM-7AM, please contact night-coverage

## 2017-04-13 NOTE — Progress Notes (Addendum)
Tele called, heart rate in the 140's, patient found covered in vomit, tech noticed patient gagging himself with his fingers, trying to induce emesis. Zofran given, MD paged, orders for EKG obtain. Will follow orders and continue to monitor   Patient found gagging himself with his fingers again, when asked why he stated, "I have to make myself throw up" Night shift RN aware of orders, EKG completed.

## 2017-04-13 NOTE — Progress Notes (Signed)
Notified MD about change in pt [facility]. No new orders given at this time.

## 2017-04-14 LAB — URINALYSIS, ROUTINE W REFLEX MICROSCOPIC
Bilirubin Urine: NEGATIVE
Glucose, UA: NEGATIVE mg/dL
Hgb urine dipstick: NEGATIVE
Ketones, ur: NEGATIVE mg/dL
Leukocytes, UA: NEGATIVE
Nitrite: NEGATIVE
Protein, ur: NEGATIVE mg/dL
Specific Gravity, Urine: 1.027 (ref 1.005–1.030)
pH: 5 (ref 5.0–8.0)

## 2017-04-14 LAB — CBC
HEMATOCRIT: 24.9 % — AB (ref 39.0–52.0)
HEMOGLOBIN: 7.9 g/dL — AB (ref 13.0–17.0)
MCH: 31.5 pg (ref 26.0–34.0)
MCHC: 31.7 g/dL (ref 30.0–36.0)
MCV: 99.2 fL (ref 78.0–100.0)
Platelets: 171 10*3/uL (ref 150–400)
RBC: 2.51 MIL/uL — ABNORMAL LOW (ref 4.22–5.81)
RDW: 18.4 % — ABNORMAL HIGH (ref 11.5–15.5)
WBC: 6.9 10*3/uL (ref 4.0–10.5)

## 2017-04-14 LAB — BASIC METABOLIC PANEL
ANION GAP: 6 (ref 5–15)
BUN: 5 mg/dL — ABNORMAL LOW (ref 6–20)
CALCIUM: 8.2 mg/dL — AB (ref 8.9–10.3)
CHLORIDE: 107 mmol/L (ref 101–111)
CO2: 22 mmol/L (ref 22–32)
Creatinine, Ser: 0.69 mg/dL (ref 0.61–1.24)
GFR calc non Af Amer: >60 mL/min (ref >60)
Glucose, Bld: 89 mg/dL (ref 65–99)
POTASSIUM: 4.2 mmol/L (ref 3.5–5.1)
Sodium: 135 mmol/L (ref 135–145)

## 2017-04-14 MED ORDER — TRAZODONE HCL 50 MG PO TABS
50.0000 mg | ORAL_TABLET | Freq: Once | ORAL | Status: AC
  Administered 2017-04-14: 50 mg via ORAL
  Filled 2017-04-14: qty 1

## 2017-04-14 MED ORDER — SODIUM CHLORIDE 0.9 % IV SOLN
3.0000 g | Freq: Three times a day (TID) | INTRAVENOUS | Status: DC
  Administered 2017-04-14 – 2017-04-16 (×7): 3 g via INTRAVENOUS
  Filled 2017-04-14 (×8): qty 3

## 2017-04-14 NOTE — Progress Notes (Signed)
PROGRESS NOTE    Patrick [name]  XHB:[id number] DOB: 11-04-1950 DOA: 04/09/2017 PCP: [name], MD      Brief Narrative:  Patrick [name] is a 67 yo M with past medical history significant for alcoholism and esophagitis who presents with 2-3 months progressive weakness, buttock ulcers.  Given IV fluids and BP dropped, unclear cause, resolved after 2 days with fluids and Levophed.  Started on IV antibiotics in ICU for possible aspiration event.     Assessment & Plan:  Principal Problem:   Hypokalemia Active Problems:   Pressure ulcer of sacral region, stage 2   Alcohol abuse   Hypotension   Hypomagnesemia   FTT (failure to thrive) in adult   GI bleed   Palliative care by specialist   DNR (do not resuscitate)   Protein-calorie malnutrition, severe   Failure to thrive Generalized Weakness Severe protein calorie malnutrition Weakness from anemia, hypokalemia/hypoMg/hypoCa, and severe protein calorie malnutrition.  Concern for colon cancer raised by GI, they plan colonoscopy as outpatient. -Consult dietitian -Nutritional supplement ordered   Aspiration pneumonia Recurrent aspiraitons during previous hospitazliations this year.  Inducing vomiting earlier this hospitalization.  Fever after stopping antibiotics for lung infiltrate. -Restart antibiotics, coverage with Unasyn for aspiraiton/alcoholic  Depression vs dementia The patient seems to have trouble recounting information that was given him, even simple information.  He has a flat affect and anergy.  Is depressed? -Consult Psychiatry  Hypokalemia Hypomagnesemia Hypocalcemia Mag and Ca better.   -Continue oral K  Anemia of chronic blood loss Normocytic.  Hgb dropped to 5 while on fluids, presumably dilutional.  Was transfused 2 units here.  Now back to ~8-9 g/dL.  Folate and B12 normal last Oct. Ferritin up but iron concentration down.   -Continue iron -Patient understands need for outpatient  colonoscopy  Esophagitis -GI evaluated, recommended PPI, iron -Follow up in place -Continue PPI  Alcoholism -Continue CIWA scoring with lorazepam -Continue thiamine and folate -SW consult           DVT prophylaxis: SCDs Code Status: DO NOT RESUSCITATE Family Communication: None present Disposition Plan: SNF if able.  Likely will have to discharge to home, given patinet without insurance.  At present, still requires IV electrolyte replacement, IV antibiotics.  Likely home Monday or Tuesday.  Consultants:   CCM  GI  Psychiatry  Procedures:   None  Antimicrobials:  Zosyn 1/1 >> 1/4 Azithromycin 1/1 >> 1/2 Unasyn 1/5 >>      Subjective: Feels tired.  Weakness unchanged.  New fever overnight.  Gagging self episode.  Tachycardic, sinus.  This morning all resolved.  Objective: Vitals:   04/13/17 2119 04/14/17 0249 04/14/17 0546 04/14/17 1000  BP: 129/72  107/66 122/79  Pulse:   (!) 106 97  Resp:   20 20  Temp: (!) 101 F (38.3 C)  98 F (36.7 C) 98.2 F (36.8 C)  TempSrc:   Oral Oral  SpO2: 97%  98% 98%  Weight:  59.4 kg (130 lb 15.3 oz)    Height:        Intake/Output Summary (Last 24 hours) at 04/14/2017 1741 Last data filed at 04/14/2017 1300 Gross per 24 hour  Intake 240 ml  Output 120 ml  Net 120 ml   Filed Weights   04/12/17 2127 04/13/17 2014 04/14/17 0249  Weight: 59.4 kg (130 lb 15.3 oz) 59.4 kg (131 lb) 59.4 kg (130 lb 15.3 oz)    Examination: General appearance:  adult male, alert and in no  acute distress.   HEENT: Anicteric, conjunctiva pink, lids and lashes normal. No nasal deformity, discharge, epistaxis.  Lips moist.   Skin: Warm and dry.  Pale, no jaundice.  There are some stage II ulcers of his buttocks.  Cardiac: RRR, nl S1-S2, no murmurs appreciated.  Capillary refill is brisk.  JVP not visible.  No LE edema.  Radial pulses 2+ and symmetric. Respiratory: Normal respiratory rate and rhythm.  CTAB without rales or  wheezes. Abdomen: Abdomen soft.  No TTP. No ascites, distension, hepatosplenomegaly.   MSK: No deformities or effusions.  Severe diffuse loss of muscle mass. Neuro: Pupils are 3 mm and reactive to 2 mm.  Extraocular movements are intact, without nystagmus.  Cranial nerve 5 is within normal limits.  Cranial nerve 7 is symmetrical.  Cranial nerve 8 is within normal limits.  Cranial nerves 9 and 10 reveal equal palate elevation.  Cranial nerve 11 reveals sternocleidomastoid strong.  Cranial nerve 12 is midline.  Motor strength testing is 4/5 in the upper and lower extremities bilaterally with mild tremor, symmetry, decreased muscle bulk.  Sensory examination is intact to light touch.  The patient is oriented to time, place and person.  Speech is fluent.  Recall, recent and remote, as well as general fund of knowledge seem within normal limits.  Attention span and concentration are within normal limits. Psych: Sensorium intact and responding to questions, attention normal. Affect blunt.  Judgment and insight appear normal.    Data Reviewed: I have personally reviewed following labs and imaging studies:  CBC: Recent Labs  Lab 04/09/17 1620  04/11/17 0401 04/12/17 0435 04/13/17 0545 04/13/17 1914 04/14/17 0429  WBC 7.4   < > 5.1 4.4 4.9 7.8 6.9  NEUTROABS 4.7  --   --   --   --   --   --   HGB 8.2*   < > 8.5* 8.0* 7.6* 9.7* 7.9*  HCT 24.1*   < > 24.9* 23.7* 23.6* 30.0* 24.9*  MCV 94.5   < > 94.0 95.2 97.1 99.0 99.2  PLT 258   < > 185 151 158 199 171   < > = values in this interval not displayed.   Basic Metabolic Panel: Recent Labs  Lab 04/09/17 1620  04/10/17 0429 04/11/17 0401 04/12/17 0435 04/13/17 0545 04/13/17 1914 04/14/17 0429  NA 129*   < > 133* 135 138 136 136 135  K 2.1*   < > 3.4* 3.0* 3.3* 4.0 4.8 4.2  CL 83*   < > 103 109 112* 109 108 107  CO2 30  --  26 20* 21* 24 21* 22  GLUCOSE 98   < > 123* 117* 71 84 104* 89  BUN 5*   < > 11 11 6  5* 5* 5*  CREATININE 0.66   < >  0.71 0.80 0.79 0.80 0.71 0.69  CALCIUM 7.8*  --  6.7* 7.2* 7.7* 8.2* 8.7* 8.2*  MG 1.1*  --  1.5* 1.8 1.5*  --  1.4*  --   PHOS  --   --   --  2.4* 2.7  --   --   --    < > = values in this interval not displayed.   GFR: Estimated Creatinine Clearance: 78.4 mL/min (by C-G formula based on SCr of 0.69 mg/dL). Liver Function Tests: Recent Labs  Lab 04/09/17 1620 04/13/17 1914  AST 57* 95*  ALT 28 37  ALKPHOS 106 333*  BILITOT 1.3* 1.7*  PROT 6.3* 5.4*  ALBUMIN 2.4* 1.9*   No results for input(s): LIPASE, AMYLASE in the last 168 hours. Recent Labs  Lab 04/10/17 1610  AMMONIA 18   Coagulation Profile: Recent Labs  Lab 04/09/17 1620  INR 1.09   Cardiac Enzymes: Recent Labs  Lab 04/09/17 1620 04/10/17 1624 04/11/17 0037 04/11/17 0401  CKTOTAL 55  --   --   --   TROPONINI <0.03 <0.03 <0.03 <0.03   BNP (last 3 results) No results for input(s): PROBNP in the last 8760 hours. HbA1C: No results for input(s): HGBA1C in the last 72 hours. CBG: No results for input(s): GLUCAP in the last 168 hours. Lipid Profile: No results for input(s): CHOL, HDL, LDLCALC, TRIG, CHOLHDL, LDLDIRECT in the last 72 hours. Thyroid Function Tests: No results for input(s): TSH, T4TOTAL, FREET4, T3FREE, THYROIDAB in the last 72 hours. Anemia Panel: Recent Labs    04/13/17 0545  FERRITIN 837*  TIBC NOT CALCULATED  IRON 33*   Urine analysis:    Component Value Date/Time   COLORURINE AMBER (A) 04/14/2017 0305   APPEARANCEUR CLEAR 04/14/2017 0305   LABSPEC 1.027 04/14/2017 0305   PHURINE 5.0 04/14/2017 0305   GLUCOSEU NEGATIVE 04/14/2017 0305   HGBUR NEGATIVE 04/14/2017 0305   HGBUR negative 02/21/2010 1055   BILIRUBINUR NEGATIVE 04/14/2017 0305   BILIRUBINUR n 03/11/2014 1025   KETONESUR NEGATIVE 04/14/2017 0305   PROTEINUR NEGATIVE 04/14/2017 0305   UROBILINOGEN 0.2 03/11/2014 1025   UROBILINOGEN 0.2 02/21/2010 1055   NITRITE NEGATIVE 04/14/2017 0305   LEUKOCYTESUR NEGATIVE  04/14/2017 0305   Sepsis Labs: @LABRCNTIP (procalcitonin:4,lacticacidven:4)  ) Recent Results (from the past 240 hour(s))  Urine culture     Status: None   Collection Time: 04/10/17 12:42 PM  Result Value Ref Range Status   Specimen Description URINE, CATHETERIZED  Final   Special Requests Normal  Final   Culture NO GROWTH  Final   Report Status 04/11/2017 FINAL  Final  MRSA PCR Screening     Status: None   Collection Time: 04/10/17  2:00 PM  Result Value Ref Range Status   MRSA by PCR NEGATIVE NEGATIVE Final    Comment:        The GeneXpert MRSA Assay (FDA approved for NASAL specimens only), is one component of a comprehensive MRSA colonization surveillance program. It is not intended to diagnose MRSA infection nor to guide or monitor treatment for MRSA infections.   Culture, blood (routine x 2)     Status: None (Preliminary result)   Collection Time: 04/10/17  8:50 PM  Result Value Ref Range Status   Specimen Description BLOOD LEFT ANTECUBITAL  Final   Special Requests IN PEDIATRIC BOTTLE Blood Culture adequate volume  Final   Culture NO GROWTH 4 DAYS  Final   Report Status PENDING  Incomplete  Culture, blood (routine x 2)     Status: None (Preliminary result)   Collection Time: 04/10/17  8:50 PM  Result Value Ref Range Status   Specimen Description BLOOD RIGHT HAND  Final   Special Requests IN PEDIATRIC BOTTLE Blood Culture adequate volume  Final   Culture NO GROWTH 4 DAYS  Final   Report Status PENDING  Incomplete         Radiology Studies: Dg Chest Port 1 View  Result Date: 04/13/2017 CLINICAL DATA:  Fever. EXAM: PORTABLE CHEST 1 VIEW COMPARISON:  04/12/2017 FINDINGS: Heart size and pulmonary vascularity are normal. Slight right lung base opacity again demonstrated, possibly representing pneumonia. Left lung bases clearing. No blunting of  costophrenic angles. No pneumothorax. Mediastinal contours appear intact. Calcification of the aorta. Probable esophageal  hiatal hernia behind the heart. Degenerative changes in the spine and shoulders. IMPRESSION: Persistent right lung base opacity may indicate pneumonia. Left lung bases clearing since previous study. No pneumothorax or effusion. Aortic atherosclerosis. Electronically Signed   By: Lucienne Capers M.D.   On: 04/13/2017 22:49        Scheduled Meds: . chlorhexidine  15 mL Mouth Rinse BID  . Chlorhexidine Gluconate Cloth  6 each Topical Daily  . feeding supplement (ENSURE ENLIVE)  237 mL Oral BID BM  . ferrous sulfate  325 mg Oral Q breakfast  . folic acid  1 mg Oral Daily  . magnesium oxide  400 mg Oral Daily  . mouth rinse  15 mL Mouth Rinse q12n4p  . metoprolol tartrate  12.5 mg Oral BID  . multivitamin with minerals  1 tablet Oral Daily  . nicotine  21 mg Transdermal Daily  . pantoprazole (PROTONIX) IV  40 mg Intravenous Q12H  . thiamine  100 mg Oral Daily   Continuous Infusions: . ampicillin-sulbactam (UNASYN) IV 3 g (04/14/17 1455)     LOS: 4 days    Time spent: 45 minutes    Edwin Dada, MD [facility] 04/14/2017, 5:41 PM     Pager [phone] --- please page though AMION:  [email or web address] Password [email or web address] If 7PM-7AM, please contact night-coverage

## 2017-04-14 NOTE — Progress Notes (Signed)
Pharmacy Antibiotic Note  Patrick [name] is a 67 y.o. male admitted on 04/09/2017 with pneumonia.  Pharmacy has been consulted for Unasyn dosing for possible aspiration PNA. WBC WNL. Tmax this AM is 101.9. Renal function good.   Plan: Unasyn 3g IV q8h Trend WBC, temp, renal function  F/U infectious work-up  Height: 5\' 7"  (170.2 cm) Weight: 130 lb 15.3 oz (59.4 kg) IBW/kg (Calculated) : 66.1  Temp (24hrs), Avg:99.8 F (37.7 C), Min:98 F (36.7 C), Max:101.9 F (38.8 C)  Recent Labs  Lab 04/09/17 2005 04/09/17 2029 04/09/17 2310 04/10/17 0429 04/10/17 1610 04/10/17 2044 04/11/17 0401 04/12/17 0435 04/13/17 0545 04/13/17 1914  WBC  --   --   --  5.6  --   --  5.1 4.4 4.9 7.8  CREATININE  --   --   --  0.71  --   --  0.80 0.79 0.80 0.71  LATICACIDVEN 1.8 2.01* 1.5  --  1.1 1.2  --   --   --   --     Estimated Creatinine Clearance: 78.4 mL/min (by C-G formula based on SCr of 0.71 mg/dL).    No Known Allergies   Patrick [name] 04/14/2017 5:56 AM

## 2017-04-14 NOTE — Progress Notes (Signed)
Per night shift RN patient asked to for his belongings so he could leave.  This morning patient is agitated. Explained that the doctor would need to evaluate if discharge was appropriate today and put in discharge orders before he left unless he chose to leave AMA. The patient states we cannot keep him here and he plans to leave when his son arrives.

## 2017-04-15 DIAGNOSIS — L89301 Pressure ulcer of unspecified buttock, stage 1: Secondary | ICD-10-CM

## 2017-04-15 DIAGNOSIS — R5383 Other fatigue: Secondary | ICD-10-CM

## 2017-04-15 DIAGNOSIS — R5381 Other malaise: Secondary | ICD-10-CM

## 2017-04-15 DIAGNOSIS — F4321 Adjustment disorder with depressed mood: Secondary | ICD-10-CM

## 2017-04-15 DIAGNOSIS — F17210 Nicotine dependence, cigarettes, uncomplicated: Secondary | ICD-10-CM

## 2017-04-15 LAB — CULTURE, BLOOD (ROUTINE X 2)
CULTURE: NO GROWTH
CULTURE: NO GROWTH
Special Requests: ADEQUATE
Special Requests: ADEQUATE

## 2017-04-15 LAB — BASIC METABOLIC PANEL
Anion gap: 4 — ABNORMAL LOW (ref 5–15)
BUN: 5 mg/dL — AB (ref 6–20)
CHLORIDE: 107 mmol/L (ref 101–111)
CO2: 22 mmol/L (ref 22–32)
Calcium: 8.3 mg/dL — ABNORMAL LOW (ref 8.9–10.3)
Creatinine, Ser: 0.65 mg/dL (ref 0.61–1.24)
GFR calc Af Amer: >60 mL/min (ref >60)
GFR calc non Af Amer: >60 mL/min (ref >60)
GLUCOSE: 78 mg/dL (ref 65–99)
POTASSIUM: 4 mmol/L (ref 3.5–5.1)
Sodium: 133 mmol/L — ABNORMAL LOW (ref 135–145)

## 2017-04-15 LAB — CBC
HCT: 23.5 % — ABNORMAL LOW (ref 39.0–52.0)
Hemoglobin: 7.3 g/dL — ABNORMAL LOW (ref 13.0–17.0)
MCH: 31.2 pg (ref 26.0–34.0)
MCHC: 31.1 g/dL (ref 30.0–36.0)
MCV: 100.4 fL — AB (ref 78.0–100.0)
PLATELETS: 187 10*3/uL (ref 150–400)
RBC: 2.34 MIL/uL — AB (ref 4.22–5.81)
RDW: 18.3 % — ABNORMAL HIGH (ref 11.5–15.5)
WBC: 3.5 10*3/uL — ABNORMAL LOW (ref 4.0–10.5)

## 2017-04-15 LAB — PREPARE RBC (CROSSMATCH)

## 2017-04-15 MED ORDER — HYDROCORTISONE 0.5 % EX CREA
TOPICAL_CREAM | Freq: Two times a day (BID) | CUTANEOUS | Status: DC | PRN
  Administered 2017-04-16: 09:00:00 via TOPICAL
  Filled 2017-04-15 (×2): qty 28.35

## 2017-04-15 MED ORDER — TRAZODONE HCL 50 MG PO TABS
50.0000 mg | ORAL_TABLET | Freq: Once | ORAL | Status: AC
  Administered 2017-04-15: 50 mg via ORAL
  Filled 2017-04-15: qty 1

## 2017-04-15 MED ORDER — PANTOPRAZOLE SODIUM 40 MG PO TBEC
40.0000 mg | DELAYED_RELEASE_TABLET | Freq: Two times a day (BID) | ORAL | Status: DC
  Administered 2017-04-15 – 2017-04-16 (×3): 40 mg via ORAL
  Filled 2017-04-15 (×3): qty 1

## 2017-04-15 MED ORDER — SODIUM CHLORIDE 0.9 % IV SOLN: Freq: Once | INTRAVENOUS | Status: DC

## 2017-04-15 NOTE — Progress Notes (Signed)
All side rails up per patient's request

## 2017-04-15 NOTE — Progress Notes (Signed)
PROGRESS NOTE    Patrick [name]  JGO:[id number] DOB: 1950/08/06 DOA: 04/09/2017 PCP: [name], MD      Brief Narrative:  Patrick [name] is a 67 yo M with past medical history significant for alcoholism and esophagitis who presents with 2-3 months progressive weakness, buttock ulcers.  Given IV fluids and BP dropped, unclear cause, resolved after 2 days with fluids and Levophed.  Started on IV antibiotics in ICU for possible aspiration event.     Assessment & Plan:  Principal Problem:   Hypokalemia Active Problems:   Pressure ulcer of sacral region, stage 2   Alcohol abuse   Hypotension   Hypomagnesemia   FTT (failure to thrive) in adult   GI bleed   Palliative care by specialist   DNR (do not resuscitate)   Protein-calorie malnutrition, severe   Failure to thrive Generalized Weakness Severe protein calorie malnutrition Weakness from anemia, hypokalemia/hypoMg/hypoCa, and severe protein calorie malnutrition.  Concern for colon cancer raised by GI, they plan colonoscopy as outpatient. -Consult dietitian -Nutritional supplement ordered -Transfuse 1 unit   Anemia of chronic blood loss Hgb dropping again. -Continue iron -Patient understands need for outpatient colonoscopy -Transfuse 1 unit  Aspiration pneumonia Recurrent aspirations during previous hospitalizations this year.  Inducing vomiting earlier this hospitalization.  Fever after stopping antibiotics for lung infiltrate. -Continue Unasyn, afebrile now 24 hours, HR still >90  Depression vs dementia The patient seems to have trouble recounting information that was given him, even simple information.  He has a flat affect and anergy.  Is depressed? -Consult Psychiatry  Hypokalemia Hypomagnesemia Hypocalcemia Mag and Ca better.   -Continue oral K  Esophagitis -GI evaluated, recommended PPI, iron -Follow up in place -Continue PPI  Alcoholism -Continue CIWA scoring with lorazepam -Continue thiamine and  folate -SW consult           DVT prophylaxis: SCDs Code Status: DO NOT RESUSCITATE Family Communication: None present Disposition Plan: SNF if able.  Likely will have to discharge to home, given patinet without insurance.  At present, still requires IV electrolyte replacement, IV antibiotics.  Likely home Monday or Tuesday.  Consultants:   CCM  GI  Psychiatry  Procedures:   None  Antimicrobials:  Zosyn 1/1 >> 1/4 Azithromycin 1/1 >> 1/2 Unasyn 1/5 >>      Subjective: No new fever.  Still very weak, tired.  "Legs don't work".    Objective: Vitals:   04/14/17 2041 04/15/17 0307 04/15/17 0430 04/15/17 0938  BP: 114/75  115/83 106/78  Pulse: (!) 109  100 94  Resp: 19  18 18   Temp: 98.9 F (37.2 C)  98.2 F (36.8 C) 98.5 F (36.9 C)  TempSrc: Oral  Oral Oral  SpO2: 99%  96% 100%  Weight: 59.4 kg (131 lb) 59.4 kg (130 lb 15.3 oz)    Height:        Intake/Output Summary (Last 24 hours) at 04/15/2017 1454 Last data filed at 04/15/2017 0900 Gross per 24 hour  Intake 540 ml  Output 340 ml  Net 200 ml   Filed Weights   04/14/17 0249 04/14/17 2041 04/15/17 0307  Weight: 59.4 kg (130 lb 15.3 oz) 59.4 kg (131 lb) 59.4 kg (130 lb 15.3 oz)    Examination: General appearance:  adult male, alert and in no acute distress.   HEENT: Anicteric, conjunctiva pink, lids and lashes normal. No nasal deformity, discharge, epistaxis.  Lips moist.   Skin: Warm and dry.  Pale, no jaundice.  There  are some stage II ulcers of his buttocks.  Cardiac: RRR, nl S1-S2, no murmurs appreciated.  Capillary refill is brisk.  JVP not visible.  No LE edema.  Radial pulses 2+ and symmetric. Respiratory: Normal respiratory rate and rhythm.  CTAB without rales or wheezes. Abdomen: Abdomen soft.  No TTP. No ascites, distension, hepatosplenomegaly.   MSK: No deformities or effusions.  Severe diffuse loss of muscle mass. Neuro: Pupils are 3 mm and reactive to 2 mm.  Extraocular movements are  intact, without nystagmus.  Cranial nerve 5 is within normal limits.  Cranial nerve 7 is symmetrical.  Cranial nerve 8 is within normal limits.  Cranial nerves 9 and 10 reveal equal palate elevation.  Cranial nerve 11 reveals sternocleidomastoid strong.  Cranial nerve 12 is midline.  Motor strength testing is 4/5 in the upper and lower extremities bilaterally with mild tremor, symmetry, decreased muscle bulk.  Sensory examination is intact to light touch.  The patient is oriented to time, place and person.  Speech is fluent.  Recall, recent and remote, as well as general fund of knowledge seem within normal limits.  Attention span and concentration are within normal limits. Psych: Sensorium intact and responding to questions, attention normal. Affect blunt.  Judgment and insight appear normal.    Data Reviewed: I have personally reviewed following labs and imaging studies:  CBC: Recent Labs  Lab 04/09/17 1620  04/12/17 0435 04/13/17 0545 04/13/17 1914 04/14/17 0429 04/15/17 0340  WBC 7.4   < > 4.4 4.9 7.8 6.9 3.5*  NEUTROABS 4.7  --   --   --   --   --   --   HGB 8.2*   < > 8.0* 7.6* 9.7* 7.9* 7.3*  HCT 24.1*   < > 23.7* 23.6* 30.0* 24.9* 23.5*  MCV 94.5   < > 95.2 97.1 99.0 99.2 100.4*  PLT 258   < > 151 158 199 171 187   < > = values in this interval not displayed.   Basic Metabolic Panel: Recent Labs  Lab 04/09/17 1620  04/10/17 0429 04/11/17 0401 04/12/17 0435 04/13/17 0545 04/13/17 1914 04/14/17 0429 04/15/17 0340  NA 129*   < > 133* 135 138 136 136 135 133*  K 2.1*   < > 3.4* 3.0* 3.3* 4.0 4.8 4.2 4.0  CL 83*   < > 103 109 112* 109 108 107 107  CO2 30  --  26 20* 21* 24 21* 22 22  GLUCOSE 98   < > 123* 117* 71 84 104* 89 78  BUN 5*   < > 11 11 6  5* 5* 5* 5*  CREATININE 0.66   < > 0.71 0.80 0.79 0.80 0.71 0.69 0.65  CALCIUM 7.8*  --  6.7* 7.2* 7.7* 8.2* 8.7* 8.2* 8.3*  MG 1.1*  --  1.5* 1.8 1.5*  --  1.4*  --   --   PHOS  --   --   --  2.4* 2.7  --   --   --   --     < > = values in this interval not displayed.   GFR: Estimated Creatinine Clearance: 78.4 mL/min (by C-G formula based on SCr of 0.65 mg/dL). Liver Function Tests: Recent Labs  Lab 04/09/17 1620 04/13/17 1914  AST 57* 95*  ALT 28 37  ALKPHOS 106 333*  BILITOT 1.3* 1.7*  PROT 6.3* 5.4*  ALBUMIN 2.4* 1.9*   No results for input(s): LIPASE, AMYLASE in the last 168 hours.  Recent Labs  Lab 04/10/17 1610  AMMONIA 18   Coagulation Profile: Recent Labs  Lab 04/09/17 1620  INR 1.09   Cardiac Enzymes: Recent Labs  Lab 04/09/17 1620 04/10/17 1624 04/11/17 0037 04/11/17 0401  CKTOTAL 55  --   --   --   TROPONINI <0.03 <0.03 <0.03 <0.03   BNP (last 3 results) No results for input(s): PROBNP in the last 8760 hours. HbA1C: No results for input(s): HGBA1C in the last 72 hours. CBG: No results for input(s): GLUCAP in the last 168 hours. Lipid Profile: No results for input(s): CHOL, HDL, LDLCALC, TRIG, CHOLHDL, LDLDIRECT in the last 72 hours. Thyroid Function Tests: No results for input(s): TSH, T4TOTAL, FREET4, T3FREE, THYROIDAB in the last 72 hours. Anemia Panel: Recent Labs    04/13/17 0545  FERRITIN 837*  TIBC NOT CALCULATED  IRON 33*   Urine analysis:    Component Value Date/Time   COLORURINE AMBER (A) 04/14/2017 0305   APPEARANCEUR CLEAR 04/14/2017 0305   LABSPEC 1.027 04/14/2017 0305   PHURINE 5.0 04/14/2017 0305   GLUCOSEU NEGATIVE 04/14/2017 0305   HGBUR NEGATIVE 04/14/2017 0305   HGBUR negative 02/21/2010 1055   BILIRUBINUR NEGATIVE 04/14/2017 0305   BILIRUBINUR n 03/11/2014 1025   KETONESUR NEGATIVE 04/14/2017 0305   PROTEINUR NEGATIVE 04/14/2017 0305   UROBILINOGEN 0.2 03/11/2014 1025   UROBILINOGEN 0.2 02/21/2010 1055   NITRITE NEGATIVE 04/14/2017 0305   LEUKOCYTESUR NEGATIVE 04/14/2017 0305   Sepsis Labs: @LABRCNTIP (procalcitonin:4,lacticacidven:4)  ) Recent Results (from the past 240 hour(s))  Urine culture     Status: None   Collection  Time: 04/10/17 12:42 PM  Result Value Ref Range Status   Specimen Description URINE, CATHETERIZED  Final   Special Requests Normal  Final   Culture NO GROWTH  Final   Report Status 04/11/2017 FINAL  Final  MRSA PCR Screening     Status: None   Collection Time: 04/10/17  2:00 PM  Result Value Ref Range Status   MRSA by PCR NEGATIVE NEGATIVE Final    Comment:        The GeneXpert MRSA Assay (FDA approved for NASAL specimens only), is one component of a comprehensive MRSA colonization surveillance program. It is not intended to diagnose MRSA infection nor to guide or monitor treatment for MRSA infections.   Culture, blood (routine x 2)     Status: None   Collection Time: 04/10/17  8:50 PM  Result Value Ref Range Status   Specimen Description BLOOD LEFT ANTECUBITAL  Final   Special Requests IN PEDIATRIC BOTTLE Blood Culture adequate volume  Final   Culture NO GROWTH 5 DAYS  Final   Report Status 04/15/2017 FINAL  Final  Culture, blood (routine x 2)     Status: None   Collection Time: 04/10/17  8:50 PM  Result Value Ref Range Status   Specimen Description BLOOD RIGHT HAND  Final   Special Requests IN PEDIATRIC BOTTLE Blood Culture adequate volume  Final   Culture NO GROWTH 5 DAYS  Final   Report Status 04/15/2017 FINAL  Final  Culture, blood (routine x 2)     Status: None (Preliminary result)   Collection Time: 04/13/17 10:58 PM  Result Value Ref Range Status   Specimen Description BLOOD RIGHT HAND  Final   Special Requests IN PEDIATRIC BOTTLE Blood Culture adequate volume  Final   Culture NO GROWTH 1 DAY  Final   Report Status PENDING  Incomplete  Culture, blood (routine x 2)  Status: None (Preliminary result)   Collection Time: 04/13/17 11:02 PM  Result Value Ref Range Status   Specimen Description BLOOD LEFT HAND  Final   Special Requests IN PEDIATRIC BOTTLE Blood Culture adequate volume  Final   Culture NO GROWTH 1 DAY  Final   Report Status PENDING  Incomplete          Radiology Studies: Dg Chest Port 1 View  Result Date: 04/13/2017 CLINICAL DATA:  Fever. EXAM: PORTABLE CHEST 1 VIEW COMPARISON:  04/12/2017 FINDINGS: Heart size and pulmonary vascularity are normal. Slight right lung base opacity again demonstrated, possibly representing pneumonia. Left lung bases clearing. No blunting of costophrenic angles. No pneumothorax. Mediastinal contours appear intact. Calcification of the aorta. Probable esophageal hiatal hernia behind the heart. Degenerative changes in the spine and shoulders. IMPRESSION: Persistent right lung base opacity may indicate pneumonia. Left lung bases clearing since previous study. No pneumothorax or effusion. Aortic atherosclerosis. Electronically Signed   By: Lucienne Capers M.D.   On: 04/13/2017 22:49        Scheduled Meds: . chlorhexidine  15 mL Mouth Rinse BID  . Chlorhexidine Gluconate Cloth  6 each Topical Daily  . feeding supplement (ENSURE ENLIVE)  237 mL Oral BID BM  . ferrous sulfate  325 mg Oral Q breakfast  . folic acid  1 mg Oral Daily  . mouth rinse  15 mL Mouth Rinse q12n4p  . metoprolol tartrate  12.5 mg Oral BID  . multivitamin with minerals  1 tablet Oral Daily  . nicotine  21 mg Transdermal Daily  . pantoprazole  40 mg Oral BID AC  . thiamine  100 mg Oral Daily   Continuous Infusions: . sodium chloride    . ampicillin-sulbactam (UNASYN) IV 3 g (04/15/17 0505)     LOS: 5 days    Time spent: 25 minutes    Edwin Dada, MD [facility] 04/15/2017, 2:54 PM     Pager [phone] --- please page though AMION:  [email or web address] Password [email or web address] If 7PM-7AM, please contact night-coverage

## 2017-04-15 NOTE — Consult Note (Signed)
[facility] Face-to-Face Psychiatry Consult   Reason for Consult:  Depression or Dementia Referring Physician:  Dr. Loleta Books Patient Identification: Patrick [name] MRN:  [id number] Principal Diagnosis: Adjustment disorder with depressed mood Diagnosis:   Patient Active Problem List   Diagnosis Date Noted  . Adjustment disorder with depressed mood [F43.21] 04/15/2017  . Protein-calorie malnutrition, severe [E43] 04/12/2017  . Palliative care by specialist [Z51.5]   . DNR (do not resuscitate) [Z66]   . GI bleed [K92.2] 04/10/2017  . FTT (failure to thrive) in adult [R62.7] 04/09/2017  . Generalized weakness [R53.1]   . Alcohol withdrawal ([place]) [F10.239] 01/04/2017  . Esophagitis [K20.9] 01/04/2017  . Decubitus ulcer of buttock, stage 1 [L89.301]   . Upper GI bleed [K92.2] 12/20/2016  . AKI (acute kidney injury) ([place]) [N17.9] 12/20/2016  . 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 Encounter for palliative care [Z51.5]   . Goals of care, counseling/discussion [Z71.89]   . Dysphagia [R13.10] 11/30/2016  . Symptomatic anemia [D64.9] 11/25/2016  . Pressure ulcer of sacral region, stage 2 [L89.152] 11/25/2016  . Alcohol abuse [F10.10] 11/25/2016  . Hypotension [I95.9] 11/25/2016  . Hypomagnesemia [E83.42] 11/25/2016  . Hyponatremia [E87.1] 11/25/2016  . Non-traumatic rhabdomyolysis [M62.82] 11/25/2016  . Erosive esophagitis [K22.10] 05/14/2015  . Hypokalemia [E87.6] 04/30/2015  . Thrombocytopenia ([place]) [D69.6] 04/16/2015  . Alcohol dependence ([place]) [F10.20] 03/17/2015  . Change in bowel habits [R19.4] 02/23/2015  . Rectal bleeding [K62.5] 02/23/2015  . Constipation [K59.00] 02/23/2015  . Transaminasemia [R74.0] 03/27/2013  . Essential tremor [G25.0] 03/27/2013  . Hypertension [I10] 07/19/2012  . TOBACCO USE [F17.200] 03/01/2010  . DIVERTICULOSIS, COLON [K57.30] 05/14/2007  . DEPRESSION [F32.9] 11/05/2006  . COLONIC POLYPS, HX OF [Z86.010] 11/05/2006    Total Time  spent with patient: 45 minutes  Subjective:   Patrick [name] is a 67 y.o. male patient admitted with weakness and buttock ulcer.  HPI:  Thanks for asking me to do a psychiatric consult on Patrick [name], a 67 yo Male with past medical history significant for alcoholism and esophagitis who presents with 2-3 months progressive weakness and  buttock ulcers. Patient denies prior history of mental illness. He reports that he lives with his son and he did not verbalizes any form of maltreatment by his family. Patient reports that he feels sad from time to time due to have a non healing buttock Ulcer but denies feeling hopeless, suicidal, memory problem, psychotic or Delusional. He reports history of occasional alcohol use in the past  but denies current alcohol or substance abuse. Patient says he is not interested in taking medication for depression but wants solution to his buttock ulcer.Patient scored 29/30 on mini-mental status examination.   Past Psychiatric History: Denies  Risk to Self: Is patient at risk for suicide?: No Risk to Others:   Prior Inpatient Therapy:   Prior Outpatient Therapy:    Past Medical History:  Past Medical History:  Diagnosis Date  . Colon polyps   . Depression   . Diverticulitis   . Diverticulosis of colon   . GERD (gastroesophageal reflux disease)   . Kidney stone     Past Surgical History:  Procedure Laterality Date  . COLONOSCOPY    . ESOPHAGOGASTRODUODENOSCOPY (EGD) WITH PROPOFOL N/A 12/21/2016   Procedure: ESOPHAGOGASTRODUODENOSCOPY (EGD) WITH PROPOFOL;  Surgeon: Doran Stabler, MD;  Location: [facility] ENDOSCOPY;  Service: Gastroenterology;  Laterality: N/A;  . UPPER GASTROINTESTINAL ENDOSCOPY     Family History:  Family History  Problem Relation Age  of Onset  . Heart disease Father 33       cabg   Family Psychiatric  History:  Social History:  Social History   Substance and Sexual Activity  Alcohol Use Yes  . Alcohol/week: 8.4 oz  . Types: 14 Glasses  of wine per week     Social History   Substance and Sexual Activity  Drug Use No    Social History   Socioeconomic History  . Marital status: Divorced    Spouse name: None  . Number of children: None  . Years of education: None  . Highest education level: None  Social Needs  . Financial resource strain: None  . Food insecurity - worry: None  . Food insecurity - inability: None  . Transportation needs - medical: None  . Transportation needs - non-medical: None  Occupational History  . None  Tobacco Use  . Smoking status: Current Every Day Smoker    Packs/day: 1.00    Types: Cigarettes  . Smokeless tobacco: Never Used  Substance and Sexual Activity  . Alcohol use: Yes    Alcohol/week: 8.4 oz    Types: 14 Glasses of wine per week  . Drug use: No  . Sexual activity: No  Other Topics Concern  . None  Social History Narrative  . None   Additional Social History:    Allergies:  No Known Allergies  Labs:  Results for orders placed or performed during the hospital encounter of 04/09/17 (from the past 48 hour(s))  Comprehensive metabolic panel     Status: Abnormal   Collection Time: 04/13/17  7:14 PM  Result Value Ref Range   Sodium 136 135 - 145 mmol/L   Potassium 4.8 3.5 - 5.1 mmol/L   Chloride 108 101 - 111 mmol/L   CO2 21 (L) 22 - 32 mmol/L   Glucose, Bld 104 (H) 65 - 99 mg/dL   BUN 5 (L) 6 - 20 mg/dL   Creatinine, Ser 0.71 0.61 - 1.24 mg/dL   Calcium 8.7 (L) 8.9 - 10.3 mg/dL   Total Protein 5.4 (L) 6.5 - 8.1 g/dL   Albumin 1.9 (L) 3.5 - 5.0 g/dL   AST 95 (H) 15 - 41 U/L   ALT 37 17 - 63 U/L   Alkaline Phosphatase 333 (H) 38 - 126 U/L   Total Bilirubin 1.7 (H) 0.3 - 1.2 mg/dL   GFR calc non Af Amer >60 >60 mL/min   GFR calc Af Amer >60 >60 mL/min    Comment: (NOTE) The eGFR has been calculated using the CKD EPI equation. This calculation has not been validated in all clinical situations. eGFR's persistently <60 mL/min signify possible Chronic  Kidney Disease.    Anion gap 7 5 - 15  Magnesium     Status: Abnormal   Collection Time: 04/13/17  7:14 PM  Result Value Ref Range   Magnesium 1.4 (L) 1.7 - 2.4 mg/dL  CBC     Status: Abnormal   Collection Time: 04/13/17  7:14 PM  Result Value Ref Range   WBC 7.8 4.0 - 10.5 K/uL   RBC 3.03 (L) 4.22 - 5.81 MIL/uL   Hemoglobin 9.7 (L) 13.0 - 17.0 g/dL    Comment: DELTA CHECK NOTED REPEATED TO VERIFY    HCT 30.0 (L) 39.0 - 52.0 %   MCV 99.0 78.0 - 100.0 fL   MCH 32.0 26.0 - 34.0 pg   MCHC 32.3 30.0 - 36.0 g/dL   RDW 18.6 (H) 11.5 - 15.5 %  Platelets 199 150 - 400 K/uL  Culture, blood (routine x 2)     Status: None (Preliminary result)   Collection Time: 04/13/17 10:58 PM  Result Value Ref Range   Specimen Description BLOOD RIGHT HAND    Special Requests IN PEDIATRIC BOTTLE Blood Culture adequate volume    Culture NO GROWTH 1 DAY    Report Status PENDING   Culture, blood (routine x 2)     Status: None (Preliminary result)   Collection Time: 04/13/17 11:02 PM  Result Value Ref Range   Specimen Description BLOOD LEFT HAND    Special Requests IN PEDIATRIC BOTTLE Blood Culture adequate volume    Culture NO GROWTH 1 DAY    Report Status PENDING   Urinalysis, Routine w reflex microscopic     Status: Abnormal   Collection Time: 04/14/17  3:05 AM  Result Value Ref Range   Color, Urine AMBER (A) YELLOW    Comment: BIOCHEMICALS MAY BE AFFECTED BY COLOR   APPearance CLEAR CLEAR   Specific Gravity, Urine 1.027 1.005 - 1.030   pH 5.0 5.0 - 8.0   Glucose, UA NEGATIVE NEGATIVE mg/dL   Hgb urine dipstick NEGATIVE NEGATIVE   Bilirubin Urine NEGATIVE NEGATIVE   Ketones, ur NEGATIVE NEGATIVE mg/dL   Protein, ur NEGATIVE NEGATIVE mg/dL   Nitrite NEGATIVE NEGATIVE   Leukocytes, UA NEGATIVE NEGATIVE  CBC     Status: Abnormal   Collection Time: 04/14/17  4:29 AM  Result Value Ref Range   WBC 6.9 4.0 - 10.5 K/uL   RBC 2.51 (L) 4.22 - 5.81 MIL/uL   Hemoglobin 7.9 (L) 13.0 - 17.0 g/dL    HCT 24.9 (L) 39.0 - 52.0 %   MCV 99.2 78.0 - 100.0 fL   MCH 31.5 26.0 - 34.0 pg   MCHC 31.7 30.0 - 36.0 g/dL   RDW 18.4 (H) 11.5 - 15.5 %   Platelets 171 150 - 400 K/uL  Basic metabolic panel     Status: Abnormal   Collection Time: 04/14/17  4:29 AM  Result Value Ref Range   Sodium 135 135 - 145 mmol/L   Potassium 4.2 3.5 - 5.1 mmol/L   Chloride 107 101 - 111 mmol/L   CO2 22 22 - 32 mmol/L   Glucose, Bld 89 65 - 99 mg/dL   BUN 5 (L) 6 - 20 mg/dL   Creatinine, Ser 0.69 0.61 - 1.24 mg/dL   Calcium 8.2 (L) 8.9 - 10.3 mg/dL   GFR calc non Af Amer >60 >60 mL/min   GFR calc Af Amer >60 >60 mL/min    Comment: (NOTE) The eGFR has been calculated using the CKD EPI equation. This calculation has not been validated in all clinical situations. eGFR's persistently <60 mL/min signify possible Chronic Kidney Disease.    Anion gap 6 5 - 15  CBC     Status: Abnormal   Collection Time: 04/15/17  3:40 AM  Result Value Ref Range   WBC 3.5 (L) 4.0 - 10.5 K/uL   RBC 2.34 (L) 4.22 - 5.81 MIL/uL   Hemoglobin 7.3 (L) 13.0 - 17.0 g/dL   HCT 23.5 (L) 39.0 - 52.0 %   MCV 100.4 (H) 78.0 - 100.0 fL   MCH 31.2 26.0 - 34.0 pg   MCHC 31.1 30.0 - 36.0 g/dL   RDW 18.3 (H) 11.5 - 15.5 %   Platelets 187 150 - 400 K/uL  Basic metabolic panel     Status: Abnormal   Collection Time: 04/15/17  3:40 AM  Result Value Ref Range   Sodium 133 (L) 135 - 145 mmol/L   Potassium 4.0 3.5 - 5.1 mmol/L   Chloride 107 101 - 111 mmol/L   CO2 22 22 - 32 mmol/L   Glucose, Bld 78 65 - 99 mg/dL   BUN 5 (L) 6 - 20 mg/dL   Creatinine, Ser 0.65 0.61 - 1.24 mg/dL   Calcium 8.3 (L) 8.9 - 10.3 mg/dL   GFR calc non Af Amer >60 >60 mL/min   GFR calc Af Amer >60 >60 mL/min    Comment: (NOTE) The eGFR has been calculated using the CKD EPI equation. This calculation has not been validated in all clinical situations. eGFR's persistently <60 mL/min signify possible Chronic Kidney Disease.    Anion gap 4 (L) 5 - 15  Type and  screen Meta     Status: None (Preliminary result)   Collection Time: 04/15/17  2:20 PM  Result Value Ref Range   ABO/RH(D) A POS    Antibody Screen PENDING    Sample Expiration 04/18/2017     Current Facility-Administered Medications  Medication Dose Route Frequency Provider Last Rate Last Dose  . 0.9 %  sodium chloride infusion   Intravenous Once Danford, Suann Larry, MD      . acetaminophen (TYLENOL) tablet 650 mg  650 mg Oral Q6H PRN Vertis Kelch, NP   650 mg at 04/13/17 2143  . albuterol (PROVENTIL) (2.5 MG/3ML) 0.083% nebulizer solution 2.5 mg  2.5 mg Nebulization Q4H PRN Sampson Goon, MD   2.5 mg at 04/10/17 2108  . Ampicillin-Sulbactam (UNASYN) 3 g in sodium chloride 0.9 % 100 mL IVPB  3 g Intravenous Q8H Erenest Blank, RPH 200 mL/hr at 04/15/17 1458 3 g at 04/15/17 1458  . chlorhexidine (PERIDEX) 0.12 % solution 15 mL  15 mL Mouth Rinse BID Sampson Goon, MD   15 mL at 04/13/17 1003  . Chlorhexidine Gluconate Cloth 2 % PADS 6 each  6 each Topical Daily Sampson Goon, MD   6 each at 04/11/17 0930  . feeding supplement (ENSURE ENLIVE) (ENSURE ENLIVE) liquid 237 mL  237 mL Oral BID BM Derrill Kay A, MD   237 mL at 04/13/17 1005  . ferrous sulfate tablet 325 mg  325 mg Oral Q breakfast Edwin Dada, MD   325 mg at 04/15/17 0804  . folic acid (FOLVITE) tablet 1 mg  1 mg Oral Daily Derrill Kay A, MD   1 mg at 04/15/17 1053  . HYDROcodone-acetaminophen (NORCO/VICODIN) 5-325 MG per tablet 1-2 tablet  1-2 tablet Oral Q6H PRN Laverle Hobby, MD   2 tablet at 04/15/17 0804  . hydrocortisone cream 0.5 %   Topical BID PRN Edwin Dada, MD      . MEDLINE mouth rinse  15 mL Mouth Rinse q12n4p Sampson Goon, MD   15 mL at 04/11/17 1600  . metoprolol tartrate (LOPRESSOR) tablet 12.5 mg  12.5 mg Oral BID Derrill Kay A, MD   12.5 mg at 04/15/17 1052  . multivitamin with minerals tablet 1 tablet  1 tablet Oral Daily Phillips Grout, MD   1 tablet at 04/15/17 1052  . nicotine (NICODERM CQ - dosed in mg/24 hours) patch 21 mg  21 mg Transdermal Daily Derrill Kay A, MD   21 mg at 04/15/17 1053  . ondansetron (ZOFRAN) tablet 4 mg  4 mg Oral Q6H PRN Phillips Grout, MD  4 mg at 04/12/17 0606   Or  . ondansetron (ZOFRAN) injection 4 mg  4 mg Intravenous Q6H PRN Phillips Grout, MD   4 mg at 04/13/17 1846  . pantoprazole (PROTONIX) EC tablet 40 mg  40 mg Oral BID AC Danford, Suann Larry, MD   40 mg at 04/15/17 0804  . thiamine (VITAMIN B-1) tablet 100 mg  100 mg Oral Daily Schorr, Rhetta Mura, NP   100 mg at 04/15/17 1053    Musculoskeletal: Strength & Muscle Tone: not tested Gait & Station: not tested Patient leans: N/A  Psychiatric Specialty Exam: Physical Exam  Psychiatric: His speech is normal. Judgment and thought content normal. His affect is blunt. He is withdrawn. Cognition and memory are normal.    Review of Systems  Constitutional: Positive for malaise/fatigue.  HENT: Negative.   Eyes: Negative.   Respiratory: Negative.   Cardiovascular: Negative.   Gastrointestinal: Negative.   Genitourinary: Negative.   Musculoskeletal: Negative.   Skin: Negative.   Neurological: Positive for weakness.  Psychiatric/Behavioral: Positive for depression.    Blood pressure 106/78, pulse 94, temperature 98.5 F (36.9 C), temperature source Oral, resp. rate 18, height 5' 7"  (1.702 m), weight 59.4 kg (130 lb 15.3 oz), SpO2 100 %.Body mass index is 20.51 kg/m.  General Appearance: Casual  Eye Contact:  Good  Speech:  Clear and Coherent  Volume:  Normal  Mood:  Dysphoric  Affect:  Constricted  Thought Process:  Coherent and Descriptions of Associations: Intact  Orientation:  Full (Time, Place, and Person)  Thought Content:  Logical  Suicidal Thoughts:  No  Homicidal Thoughts:  No  Memory:  Immediate;   Good Recent;   Good Remote;   Good  Judgement:  Intact  Insight:  Fair  Psychomotor Activity:  Psychomotor  Retardation  Concentration:  Concentration: Good and Attention Span: Good  Recall:  Good  Fund of Knowledge:  Good  Language:  Good  Akathisia:  No  Handed:  Right  AIMS (if indicated):     Assets:  Communication Skills  ADL's:  marginal  Cognition:  WNL  Sleep:   fair     Treatment Plan Summary: -Patient will benefit from referral to a therapy for counseling upon discharge to address his ocassional sadness due to his medical condition. -I do not see any evidence of Dementia but you may refer patient to a Neurologist upon discharge for full Dementia work up.  Disposition: Patient is cleared by psychiatric service.  Patrick Pilgrim, MD 04/15/2017 3:34 PM

## 2017-04-15 NOTE — Progress Notes (Signed)
Refuses to get oob, up to chair or to ambulate

## 2017-04-16 DIAGNOSIS — J181 Lobar pneumonia, unspecified organism: Secondary | ICD-10-CM

## 2017-04-16 DIAGNOSIS — R112 Nausea with vomiting, unspecified: Secondary | ICD-10-CM

## 2017-04-16 DIAGNOSIS — Z66 Do not resuscitate: Secondary | ICD-10-CM

## 2017-04-16 DIAGNOSIS — Z515 Encounter for palliative care: Secondary | ICD-10-CM

## 2017-04-16 DIAGNOSIS — E43 Unspecified severe protein-calorie malnutrition: Secondary | ICD-10-CM

## 2017-04-16 DIAGNOSIS — R509 Fever, unspecified: Secondary | ICD-10-CM

## 2017-04-16 DIAGNOSIS — E871 Hypo-osmolality and hyponatremia: Secondary | ICD-10-CM

## 2017-04-16 DIAGNOSIS — R531 Weakness: Secondary | ICD-10-CM

## 2017-04-16 DIAGNOSIS — F4321 Adjustment disorder with depressed mood: Secondary | ICD-10-CM

## 2017-04-16 DIAGNOSIS — L89152 Pressure ulcer of sacral region, stage 2: Secondary | ICD-10-CM

## 2017-04-16 LAB — BPAM RBC
BLOOD PRODUCT EXPIRATION DATE: 201902012359
ISSUE DATE / TIME: 201901061710
UNIT TYPE AND RH: 6200

## 2017-04-16 LAB — TYPE AND SCREEN
ABO/RH(D): A POS
ANTIBODY SCREEN: NEGATIVE
Unit division: 0

## 2017-04-16 LAB — BASIC METABOLIC PANEL
ANION GAP: 5 (ref 5–15)
BUN: <5 mg/dL — ABNORMAL LOW (ref 6–20)
CALCIUM: 8.3 mg/dL — AB (ref 8.9–10.3)
CHLORIDE: 108 mmol/L (ref 101–111)
CO2: 25 mmol/L (ref 22–32)
CREATININE: 0.72 mg/dL (ref 0.61–1.24)
GFR calc non Af Amer: >60 mL/min (ref >60)
Glucose, Bld: 74 mg/dL (ref 65–99)
Potassium: 4.3 mmol/L (ref 3.5–5.1)
SODIUM: 138 mmol/L (ref 135–145)

## 2017-04-16 LAB — CBC
HEMATOCRIT: 27.1 % — AB (ref 39.0–52.0)
HEMOGLOBIN: 8.6 g/dL — AB (ref 13.0–17.0)
MCH: 31.5 pg (ref 26.0–34.0)
MCHC: 31.7 g/dL (ref 30.0–36.0)
MCV: 99.3 fL (ref 78.0–100.0)
Platelets: 251 10*3/uL (ref 150–400)
RBC: 2.73 MIL/uL — ABNORMAL LOW (ref 4.22–5.81)
RDW: 18.3 % — AB (ref 11.5–15.5)
WBC: 4.6 10*3/uL (ref 4.0–10.5)

## 2017-04-16 MED ORDER — AMOXICILLIN-POT CLAVULANATE 875-125 MG PO TABS: 1.0000 | ORAL_TABLET | Freq: Two times a day (BID) | ORAL | 0 refills | Status: DC

## 2017-04-16 MED ORDER — AMOXICILLIN-POT CLAVULANATE 875-125 MG PO TABS
1.0000 | ORAL_TABLET | Freq: Two times a day (BID) | ORAL | Status: DC
  Administered 2017-04-16: 1 via ORAL
  Filled 2017-04-16 (×2): qty 1

## 2017-04-16 NOTE — Progress Notes (Signed)
Physical Therapy Treatment Patient Details Name: Patrick [name] MRN: [id number] DOB: 29-Sep-1950 Today's Date: 04/16/2017    History of Present Illness Pt is a 67 y/o male admitted secondary to weakness and found to be hypokalemic. PMH including but not limited to chronic alcohol abuse.     PT Comments    Pt needed maximal encouragement to get him to participate and then he participated very little.  Pt needing min assist for OOB activity in general.  Emphasis on transfer safety and gait stability/stamina.  Could not get pt to progress gait distance.  Pt does not want HHPT, but I'm recommending because pt needs structured/directed assist to strengthen and recondition.    Follow Up Recommendations  Home health PT;Supervision - Intermittent     Equipment Recommendations       Recommendations for Other Services       Precautions / Restrictions Precautions Precautions: Fall    Mobility  Bed Mobility Overal bed mobility: Needs Assistance Bed Mobility: Supine to Sit;Sit to Supine     Supine to sit: Supervision Sit to supine: Supervision   General bed mobility comments: increased time, supervision for safety  Transfers   Equipment used: Rolling walker (2 wheeled) Transfers: Sit to/from Stand Sit to Stand: Min assist;Min guard         General transfer comment: pulls up consistently on the RW unless is cued to push off bed or chair.  Ambulation/Gait Ambulation/Gait assistance: Min guard;Min assist Ambulation Distance (Feet): 20 Feet(x2) Assistive device: Rolling walker (2 wheeled) Gait Pattern/deviations: Step-through pattern Gait velocity: slow Gait velocity interpretation: Below normal speed for age/gender General Gait Details: pt slow and slump of posture.  Encouragement to push himself further netted about 5 extra feet.  Pt very flat and apathetic and hard to encourage.   Stairs            Wheelchair Mobility    Modified Rankin (Stroke Patients Only)        Balance Overall balance assessment: Needs assistance Sitting-balance support: No upper extremity supported Sitting balance-Leahy Scale: Good       Standing balance-Leahy Scale: Poor(to fair) Standing balance comment: reliant on bilateral UEs on RW and min A; pt quickly fatiguing after standing for ~30 seconds with bilateral LE's trembling                            Cognition Arousal/Alertness: Awake/alert Behavior During Therapy: Flat affect Overall Cognitive Status: Impaired/Different from baseline Area of Impairment: Following commands;Safety/judgement;Awareness;Problem solving                       Following Commands: Follows one step commands with increased time Safety/Judgement: Decreased awareness of safety Awareness: Intellectual Problem Solving: Difficulty sequencing;Requires verbal cues;Requires tactile cues        Exercises      General Comments General comments (skin integrity, edema, etc.): During ambulation pt's sats maintained low to mid 90's, but pt report being dizzy and OOB      Pertinent Vitals/Pain Pain Assessment: Faces Faces Pain Scale: No hurt    Home Living                      Prior Function            PT Goals (current goals can now be found in the care plan section) Acute Rehab PT Goals Patient Stated Goal: improve strength PT Goal Formulation: With patient  Time For Goal Achievement: 04/26/17 Potential to Achieve Goals: Fair Progress towards PT goals: Progressing toward goals(Due to pt apathy, don't expect fast gains)    Frequency    Min 3X/week      PT Plan Discharge plan needs to be updated    Co-evaluation              AM-PAC PT "6 Clicks" Daily Activity  Outcome Measure  Difficulty turning over in bed (including adjusting bedclothes, sheets and blankets)?: None Difficulty moving from lying on back to sitting on the side of the bed? : None Difficulty sitting down on and standing  up from a chair with arms (e.g., wheelchair, bedside commode, etc,.)?: Unable Help needed moving to and from a bed to chair (including a wheelchair)?: A Little Help needed walking in hospital room?: A Little Help needed climbing 3-5 steps with a railing? : A Lot 6 Click Score: 17    End of Session   Activity Tolerance: Patient limited by fatigue Patient left: in bed;with call bell/phone within reach;with bed alarm set Nurse Communication: Mobility status PT Visit Diagnosis: Other abnormalities of gait and mobility (R26.89);Difficulty in walking, not elsewhere classified (R26.2)     Time: 1884-1660 PT Time Calculation (min) (ACUTE ONLY): 20 min  Charges:  $Therapeutic Activity: 8-22 mins                    G Codes:       2017/04/30  Donnella Sham, PT [phone]  (pager)   Patrick [name] 04/30/17, 3:30 PM

## 2017-04-16 NOTE — Clinical Social Work Note (Signed)
Patient medically stable for discharge and will be trasnported home by ambulance. Charge nurse talked with patient's sister and someone will be at home and patient has the keys to his house. CSW signing off as no other SW intervention services needed.  Patrick [name], MSW, LCSW Licensed Clinical Social Worker [place] [phone]

## 2017-04-16 NOTE — Discharge Summary (Signed)
Physician Discharge Summary  Patrick [name] XBM:[id number] DOB: Oct 27, 1950 DOA: 04/09/2017  PCP: Patrick Post, MD  Admit date: 04/09/2017 Discharge date: 04/16/2017  Admitted From: Home  Disposition:  Home with HHPT   Recommendations for Outpatient Follow-up:  1. Follow up with PCP in 1-2 weeks 2. Please follow up with GI in 2-3 weeks 3. Please obtain BMP/CBC in one week   Home Health: PT, RN, wound care  Equipment/Devices: Walker  Discharge Condition: Poor  CODE STATUS: DO NOT RESUSCITATE Diet recommendation: Regular  Brief/Interim Summary: Patrick [name] is a 67 yo M with past medical history significant for alcoholism and esophagitis who presents with 2-3 months progressive weakness, buttock ulcers.  Started on IV fluids in the ER, then became paradoxically hypotensive, requiring ICU admission.     Hypotension This resolved after 2 days with fluids and Levophed.  Started on IV antibiotics in ICU for possible aspiration event.     Aspiration pneumonia Patient initially treated empirically with Zosyn in the ICU.  After 3 days this was stopped.  Within 24 hours, he spiked fever, was started on Unasyn.  On Unasyn for 4 days, his fever resolved.  He was discharged on Augmentin to complete a seven day course from start of Unasyn.  Failure to thrive Generalized Weakness Severe protein calorie malnutrition Weakness from anemia, hypokalemia/hypoMg/hypoCa, and severe protein calorie malnutrition.  Concern for colon cancer raised by GI, they plan colonoscopy as outpatient.  Anemia of chronic blood loss Evaluated by GI in the ICU.  Recent EGD had shown severe esophagitis, no ulcer, no varices, and so was not repeated.  He was transfused 3 units total during his stay.  He was discharged on PPI, oral iron and with GI follow up with [facility].  Depression vs dementia The patient had flat affect and anergy and expressed hopelessness but denied depression.  He was evaluated by Psychiatry  who felt he was not depressed or cognitively impaired.  Hypokalemia Hypomagnesemia Hypocalcemia Replaced IV and orally.  Alcoholism Treated with thiamine.  No evidence of withdrawals.  SW were consulted and provided alcohol cessation resources.  Patient expressed desire to me to continue drinking "a little".        Discharge Diagnoses:  Principal Problem:   Adjustment disorder with depressed mood Active Problems:   Hypokalemia   Pressure ulcer of sacral region, stage 2   Alcohol abuse   Hypotension   Hypomagnesemia   FTT (failure to thrive) in adult   GI bleed   Palliative care by specialist   DNR (do not resuscitate)   Protein-calorie malnutrition, severe    Discharge Instructions  Discharge Instructions    Diet - low sodium heart healthy   Complete by:  As directed    Discharge instructions   Complete by:  As directed    From Patrick [name]: You were admitted for anemia, severe low potassium, and malnutrition. You were also found to have pneumonia from aspiration while you were here.  Schedule a follow up appointment with your primary care doctor Patrick [name] within 2 weeks.    For your anemia, you were transfused.  You should follow up with [facility]. Call their office at: [phone] to schedule an appointment This is very important, it is important that you get a colonoscopy with them. It is also important that you take iron ferrous sulfate 325 twice a day with food.  For your potassium, it was replaced.  You should eat a balanced diet and abstain from all  alcohol.  For your malnutrition: You should take Ensure twice daily.  Abstain from alcohol. Eat a balanced diet with at least 5 servings of fruits and vegetables daily.  For your wounds on your buttocks:  You should cover it with a foam dressing, and replace the dressing every three days Use a barrier cream like Desitin after bathing, or toileting.   Have the Home Health agency  assist you in managing this wound  For your pneumonia: Finish the antibiotic Augmentin tablet twice daily for the next 4 days.  Take until all tablets are gone.    Return for worsening fever, cough, confusion, or difficulty breathing.   Increase activity slowly   Complete by:  As directed      Allergies as of 04/16/2017   No Known Allergies     Medication List    STOP taking these medications   dibucaine 1 % Oint Commonly known as:  NUPERCAINAL   Gerhardt's butt cream Crea   polyethylene glycol powder powder Commonly known as:  GLYCOLAX/MIRALAX   RESOURCE THICKENUP CLEAR Powd     TAKE these medications   albuterol 108 (90 Base) MCG/ACT inhaler Commonly known as:  VENTOLIN HFA Inhale 2 puffs into the lungs every 4 (four) hours as needed for wheezing or shortness of breath.   amoxicillin-clavulanate 875-125 MG tablet Commonly known as:  AUGMENTIN Take 1 tablet by mouth every 12 (twelve) hours for 9 doses.   feeding supplement (ENSURE ENLIVE) Liqd Take 237 mLs by mouth 2 (two) times daily between meals.   ferrous sulfate 325 (65 FE) MG tablet Take 1 tablet (325 mg total) by mouth 3 (three) times daily with meals.   folic acid 1 MG tablet Commonly known as:  FOLVITE Take 1 tablet (1 mg total) by mouth daily.   metoprolol tartrate 25 MG tablet Commonly known as:  LOPRESSOR Take 0.5 tablets (12.5 mg total) by mouth 2 (two) times daily.   nicotine 21 mg/24hr patch Commonly known as:  NICODERM CQ - dosed in mg/24 hours Place 1 patch (21 mg total) onto the skin daily.   pantoprazole 40 MG tablet Commonly known as:  PROTONIX Take 1 tablet (40 mg total) by mouth 2 (two) times daily.   thiamine 100 MG tablet Take 1 tablet (100 mg total) by mouth daily.      Follow-up Information    [facility] Follow up.   Specialty:  Gastroenterology Contact information: [place] [id number] [place] Follow up.   Specialty:  Internal Medicine Why:  appointment on 04/20/2017 at 9 am. please bring your ID. if you cannot keep appointment. call to reschedule Contact information: [place] Follow up.   Specialty:  Home Health Services Why:  Home Health RN, Physical Therapy and Social Worker Contact information: [address] [place] [address] [phone]          No Known Allergies  Consultations:  GI  Psychiatry   Procedures/Studies: Dg Chest 2 View  Result Date: 04/09/2017 CLINICAL DATA:  Chronic cough and failure to thrive. EXAM: CHEST  2 VIEW COMPARISON:  12/20/2016 FINDINGS: The heart size and mediastinal contours are within normal limits. Minimal aortic atherosclerosis at the arch without aneurysm. Moderate aortic atherosclerosis, stable in appearance. Emphysematous hyperinflation of the lungs without pneumonic consolidation or CHF. Osteopenic  appearance of the thoracic spine. No acute osseous abnormality. IMPRESSION: Hyperinflated lungs consistent with COPD. Electronically Signed   By: Ashley Royalty M.D.   On: 04/09/2017 18:14   Dg Chest Port 1 View  Result Date: 04/13/2017 CLINICAL DATA:  Fever. EXAM: PORTABLE CHEST 1 VIEW COMPARISON:  04/12/2017 FINDINGS: Heart size and pulmonary vascularity are normal. Slight right lung base opacity again demonstrated, possibly representing pneumonia. Left lung bases clearing. No blunting of costophrenic angles. No pneumothorax. Mediastinal contours appear intact. Calcification of the aorta. Probable esophageal hiatal hernia behind the heart. Degenerative changes in the spine and shoulders. IMPRESSION: Persistent right lung base opacity may indicate pneumonia. Left lung bases clearing since previous study. No pneumothorax or effusion. Aortic atherosclerosis. Electronically Signed   By: Lucienne Capers M.D.   On:  04/13/2017 22:49   Dg Chest Port 1 View  Result Date: 04/12/2017 CLINICAL DATA:  Possible aspiration. Recent episodes of nausea and vomiting. EXAM: PORTABLE CHEST 1 VIEW COMPARISON:  Chest x-ray of April 10, 2017 and PA and lateral chest x-ray of April 09, 2017. FINDINGS: The lungs are well-expanded. There is increased density in the right infrahilar region likely in the lower lobe. The perihilar lung markings inferiorly on the left are increased as well. The heart and pulmonary vascularity are normal. There is no significant pleural effusion. There is calcification in the wall of the aortic arch. The bony thorax exhibits no acute abnormality. IMPRESSION: Increased densities likely in the lower lobes bilaterally worrisome for pneumonia, possibly related to aspiration. Thoracic aortic atherosclerosis. Electronically Signed   By: David  Martinique M.D.   On: 04/12/2017 07:03   Dg Chest Port 1 View  Result Date: 04/10/2017 CLINICAL DATA:  Pneumonia EXAM: PORTABLE CHEST 1 VIEW COMPARISON:  04/09/2017 FINDINGS: 1118 hours. The lungs are clear without focal pneumonia, edema, pneumothorax or pleural effusion. No The cardiopericardial silhouette is within normal limits for size. Small to moderate hiatal hernia. The visualized bony structures of the thorax are intact. Telemetry leads overlie the chest. IMPRESSION: No active disease. Electronically Signed   By: Misty Stanley M.D.   On: 04/10/2017 11:40       Subjective: Feels weak.  Buttocks hurt.  No new fever, confusion, cough.    Discharge Exam: Vitals:   04/16/17 0913 04/16/17 1710  BP: 124/83 121/81  Pulse: 98 94  Resp:  18  Temp:  98.4 F (36.9 C)  SpO2:  100%   Vitals:   04/16/17 0425 04/16/17 0845 04/16/17 0913 04/16/17 1710  BP: (!) 91/57 124/83 124/83 121/81  Pulse: 99 (!) 106 98 94  Resp: 18 18  18   Temp: 98.7 F (37.1 C) 97.8 F (36.6 C)  98.4 F (36.9 C)  TempSrc: Oral Oral  Oral  SpO2: 94% 98%  100%  Weight:      Height:         General: Pt is alert, awake, not in acute distress Cardiovascular: RRR, S1/S2 +, no rubs, no gallops Respiratory: No wheezing, I actually appreciate rales in the left base, not right Abdominal: Soft, NT, ND, bowel sounds + Extremities: no edema, no cyanosis MSK: Severe subq fat and muscle loss.   Neuro: Shuffling gait; strength 5/5 and symmetric, oriented x4 Skin: Small stage II ulcers on buttocks bilaterally and sacrum   The results of significant diagnostics from this hospitalization (including imaging, microbiology, ancillary and laboratory) are listed below for reference.     Microbiology: Recent Results (from the past 240 hour(s))  Urine culture  Status: None   Collection Time: 04/10/17 12:42 PM  Result Value Ref Range Status   Specimen Description URINE, CATHETERIZED  Final   Special Requests Normal  Final   Culture NO GROWTH  Final   Report Status 04/11/2017 FINAL  Final  MRSA PCR Screening     Status: None   Collection Time: 04/10/17  2:00 PM  Result Value Ref Range Status   MRSA by PCR NEGATIVE NEGATIVE Final    Comment:        The GeneXpert MRSA Assay (FDA approved for NASAL specimens only), is one component of a comprehensive MRSA colonization surveillance program. It is not intended to diagnose MRSA infection nor to guide or monitor treatment for MRSA infections.   Culture, blood (routine x 2)     Status: None   Collection Time: 04/10/17  8:50 PM  Result Value Ref Range Status   Specimen Description BLOOD LEFT ANTECUBITAL  Final   Special Requests IN PEDIATRIC BOTTLE Blood Culture adequate volume  Final   Culture NO GROWTH 5 DAYS  Final   Report Status 04/15/2017 FINAL  Final  Culture, blood (routine x 2)     Status: None   Collection Time: 04/10/17  8:50 PM  Result Value Ref Range Status   Specimen Description BLOOD RIGHT HAND  Final   Special Requests IN PEDIATRIC BOTTLE Blood Culture adequate volume  Final   Culture NO GROWTH 5 DAYS  Final    Report Status 04/15/2017 FINAL  Final  Culture, blood (routine x 2)     Status: None (Preliminary result)   Collection Time: 04/13/17 10:58 PM  Result Value Ref Range Status   Specimen Description BLOOD RIGHT HAND  Final   Special Requests IN PEDIATRIC BOTTLE Blood Culture adequate volume  Final   Culture NO GROWTH 2 DAYS  Final   Report Status PENDING  Incomplete  Culture, blood (routine x 2)     Status: None (Preliminary result)   Collection Time: 04/13/17 11:02 PM  Result Value Ref Range Status   Specimen Description BLOOD LEFT HAND  Final   Special Requests IN PEDIATRIC BOTTLE Blood Culture adequate volume  Final   Culture NO GROWTH 2 DAYS  Final   Report Status PENDING  Incomplete     Labs: BNP (last 3 results) No results for input(s): BNP in the last 8760 hours. Basic Metabolic Panel: Recent Labs  Lab 04/10/17 0429 04/11/17 0401 04/12/17 0435 04/13/17 0545 04/13/17 1914 04/14/17 0429 04/15/17 0340 04/16/17 0602  NA 133* 135 138 136 136 135 133* 138  K 3.4* 3.0* 3.3* 4.0 4.8 4.2 4.0 4.3  CL 103 109 112* 109 108 107 107 108  CO2 26 20* 21* 24 21* 22 22 25   GLUCOSE 123* 117* 71 84 104* 89 78 74  BUN 11 11 6  5* 5* 5* 5* <5*  CREATININE 0.71 0.80 0.79 0.80 0.71 0.69 0.65 0.72  CALCIUM 6.7* 7.2* 7.7* 8.2* 8.7* 8.2* 8.3* 8.3*  MG 1.5* 1.8 1.5*  --  1.4*  --   --   --   PHOS  --  2.4* 2.7  --   --   --   --   --    Liver Function Tests: Recent Labs  Lab 04/13/17 1914  AST 95*  ALT 37  ALKPHOS 333*  BILITOT 1.7*  PROT 5.4*  ALBUMIN 1.9*   No results for input(s): LIPASE, AMYLASE in the last 168 hours. Recent Labs  Lab 04/10/17 1610  AMMONIA 18  CBC: Recent Labs  Lab 04/13/17 0545 04/13/17 1914 04/14/17 0429 04/15/17 0340 04/16/17 0602  WBC 4.9 7.8 6.9 3.5* 4.6  HGB 7.6* 9.7* 7.9* 7.3* 8.6*  HCT 23.6* 30.0* 24.9* 23.5* 27.1*  MCV 97.1 99.0 99.2 100.4* 99.3  PLT 158 199 171 187 251   Cardiac Enzymes: Recent Labs  Lab 04/10/17 1624 04/11/17 0037  04/11/17 0401  TROPONINI <0.03 <0.03 <0.03   BNP: Invalid input(s): POCBNP CBG: No results for input(s): GLUCAP in the last 168 hours. D-Dimer No results for input(s): DDIMER in the last 72 hours. Hgb A1c No results for input(s): HGBA1C in the last 72 hours. Lipid Profile No results for input(s): CHOL, HDL, LDLCALC, TRIG, CHOLHDL, LDLDIRECT in the last 72 hours. Thyroid function studies No results for input(s): TSH, T4TOTAL, T3FREE, THYROIDAB in the last 72 hours.  Invalid input(s): FREET3 Anemia work up No results for input(s): VITAMINB12, FOLATE, FERRITIN, TIBC, IRON, RETICCTPCT in the last 72 hours. Urinalysis    Component Value Date/Time   COLORURINE AMBER (A) 04/14/2017 0305   APPEARANCEUR CLEAR 04/14/2017 0305   LABSPEC 1.027 04/14/2017 0305   PHURINE 5.0 04/14/2017 0305   GLUCOSEU NEGATIVE 04/14/2017 0305   HGBUR NEGATIVE 04/14/2017 0305   HGBUR negative 02/21/2010 1055   BILIRUBINUR NEGATIVE 04/14/2017 0305   BILIRUBINUR n 03/11/2014 1025   KETONESUR NEGATIVE 04/14/2017 0305   PROTEINUR NEGATIVE 04/14/2017 0305   UROBILINOGEN 0.2 03/11/2014 1025   UROBILINOGEN 0.2 02/21/2010 1055   NITRITE NEGATIVE 04/14/2017 0305   LEUKOCYTESUR NEGATIVE 04/14/2017 0305   Sepsis Labs Invalid input(s): PROCALCITONIN,  WBC,  LACTICIDVEN Microbiology Recent Results (from the past 240 hour(s))  Urine culture     Status: None   Collection Time: 04/10/17 12:42 PM  Result Value Ref Range Status   Specimen Description URINE, CATHETERIZED  Final   Special Requests Normal  Final   Culture NO GROWTH  Final   Report Status 04/11/2017 FINAL  Final  MRSA PCR Screening     Status: None   Collection Time: 04/10/17  2:00 PM  Result Value Ref Range Status   MRSA by PCR NEGATIVE NEGATIVE Final    Comment:        The GeneXpert MRSA Assay (FDA approved for NASAL specimens only), is one component of a comprehensive MRSA colonization surveillance program. It is not intended to diagnose  MRSA infection nor to guide or monitor treatment for MRSA infections.   Culture, blood (routine x 2)     Status: None   Collection Time: 04/10/17  8:50 PM  Result Value Ref Range Status   Specimen Description BLOOD LEFT ANTECUBITAL  Final   Special Requests IN PEDIATRIC BOTTLE Blood Culture adequate volume  Final   Culture NO GROWTH 5 DAYS  Final   Report Status 04/15/2017 FINAL  Final  Culture, blood (routine x 2)     Status: None   Collection Time: 04/10/17  8:50 PM  Result Value Ref Range Status   Specimen Description BLOOD RIGHT HAND  Final   Special Requests IN PEDIATRIC BOTTLE Blood Culture adequate volume  Final   Culture NO GROWTH 5 DAYS  Final   Report Status 04/15/2017 FINAL  Final  Culture, blood (routine x 2)     Status: None (Preliminary result)   Collection Time: 04/13/17 10:58 PM  Result Value Ref Range Status   Specimen Description BLOOD RIGHT HAND  Final   Special Requests IN PEDIATRIC BOTTLE Blood Culture adequate volume  Final   Culture NO GROWTH  2 DAYS  Final   Report Status PENDING  Incomplete  Culture, blood (routine x 2)     Status: None (Preliminary result)   Collection Time: 04/13/17 11:02 PM  Result Value Ref Range Status   Specimen Description BLOOD LEFT HAND  Final   Special Requests IN PEDIATRIC BOTTLE Blood Culture adequate volume  Final   Culture NO GROWTH 2 DAYS  Final   Report Status PENDING  Incomplete     Time coordinating discharge: Over 30 minutes  SIGNED:   Edwin Dada, MD  [facility] 04/16/2017, 7:19 PM

## 2017-04-16 NOTE — Progress Notes (Signed)
Patient refusing to be D/C'd today. Patient states that it does not have a ride. Ride offered to patient. Patient still refusing to be D/C'd. Kami, Agricultural consultant notified. Kami, RN explained to patient that since he had D/C orders in, that he could not stay here another night, but that we could call him a ride. Patient now agreeable to Home Health. Case Manager consulted. Home Health set up. Social Worker Event organiser contacted. Ambulance transport arranged for patient. D/C instructions reviewed with patient. Will continue to monitor.

## 2017-04-16 NOTE — Progress Notes (Signed)
NCM spoke to [facility] and pt agreed to [facility]. Jonnie Finner RN CCM Case Mgmt phone [phone]

## 2017-04-17 ENCOUNTER — Telehealth: Payer: Self-pay | Admitting: Family Medicine

## 2017-04-17 NOTE — Telephone Encounter (Signed)
I spoke with pt, he would prefer to call the office back later today.

## 2017-04-18 NOTE — Telephone Encounter (Signed)
I left a voice message for pt to return my call.  

## 2017-04-19 LAB — CULTURE, BLOOD (ROUTINE X 2)
Culture: NO GROWTH
Culture: NO GROWTH
Special Requests: ADEQUATE
Special Requests: ADEQUATE

## 2017-04-19 NOTE — Telephone Encounter (Signed)
I left a voice message for pt to return my call.  

## 2017-04-20 ENCOUNTER — Ambulatory Visit: Payer: Self-pay | Admitting: Family Medicine

## 2017-04-20 ENCOUNTER — Inpatient Hospital Stay (HOSPITAL_COMMUNITY)
Admission: EM | Admit: 2017-04-20 | Discharge: 2017-04-27 | DRG: 640 | Disposition: A | Discharge status: Home / Self Care | Payer: Self-pay | Attending: Internal Medicine | Admitting: Internal Medicine

## 2017-04-20 ENCOUNTER — Emergency Department (HOSPITAL_COMMUNITY): Payer: Self-pay

## 2017-04-20 ENCOUNTER — Encounter (HOSPITAL_COMMUNITY): Payer: Self-pay | Admitting: Emergency Medicine

## 2017-04-20 DIAGNOSIS — E43 Unspecified severe protein-calorie malnutrition: Secondary | ICD-10-CM | POA: Diagnosis present

## 2017-04-20 DIAGNOSIS — F17210 Nicotine dependence, cigarettes, uncomplicated: Secondary | ICD-10-CM | POA: Diagnosis present

## 2017-04-20 DIAGNOSIS — E871 Hypo-osmolality and hyponatremia: Principal | ICD-10-CM | POA: Diagnosis present

## 2017-04-20 DIAGNOSIS — F17200 Nicotine dependence, unspecified, uncomplicated: Secondary | ICD-10-CM | POA: Diagnosis present

## 2017-04-20 DIAGNOSIS — R627 Adult failure to thrive: Secondary | ICD-10-CM | POA: Diagnosis present

## 2017-04-20 DIAGNOSIS — K2210 Ulcer of esophagus without bleeding: Secondary | ICD-10-CM | POA: Diagnosis present

## 2017-04-20 DIAGNOSIS — E860 Dehydration: Secondary | ICD-10-CM | POA: Diagnosis present

## 2017-04-20 DIAGNOSIS — B369 Superficial mycosis, unspecified: Secondary | ICD-10-CM | POA: Diagnosis present

## 2017-04-20 DIAGNOSIS — R159 Full incontinence of feces: Secondary | ICD-10-CM | POA: Diagnosis present

## 2017-04-20 DIAGNOSIS — E876 Hypokalemia: Secondary | ICD-10-CM | POA: Diagnosis present

## 2017-04-20 DIAGNOSIS — R531 Weakness: Secondary | ICD-10-CM

## 2017-04-20 DIAGNOSIS — R1312 Dysphagia, oropharyngeal phase: Secondary | ICD-10-CM

## 2017-04-20 DIAGNOSIS — K219 Gastro-esophageal reflux disease without esophagitis: Secondary | ICD-10-CM | POA: Diagnosis present

## 2017-04-20 DIAGNOSIS — Z79899 Other long term (current) drug therapy: Secondary | ICD-10-CM

## 2017-04-20 DIAGNOSIS — F1020 Alcohol dependence, uncomplicated: Secondary | ICD-10-CM | POA: Diagnosis present

## 2017-04-20 DIAGNOSIS — Z87442 Personal history of urinary calculi: Secondary | ICD-10-CM

## 2017-04-20 DIAGNOSIS — I10 Essential (primary) hypertension: Secondary | ICD-10-CM | POA: Diagnosis present

## 2017-04-20 DIAGNOSIS — R32 Unspecified urinary incontinence: Secondary | ICD-10-CM | POA: Diagnosis present

## 2017-04-20 DIAGNOSIS — R1310 Dysphagia, unspecified: Secondary | ICD-10-CM

## 2017-04-20 DIAGNOSIS — E873 Alkalosis: Secondary | ICD-10-CM | POA: Diagnosis present

## 2017-04-20 DIAGNOSIS — L89152 Pressure ulcer of sacral region, stage 2: Secondary | ICD-10-CM | POA: Diagnosis present

## 2017-04-20 DIAGNOSIS — F329 Major depressive disorder, single episode, unspecified: Secondary | ICD-10-CM | POA: Diagnosis present

## 2017-04-20 DIAGNOSIS — R000 Tachycardia, unspecified: Secondary | ICD-10-CM | POA: Diagnosis present

## 2017-04-20 DIAGNOSIS — Z66 Do not resuscitate: Secondary | ICD-10-CM | POA: Diagnosis present

## 2017-04-20 DIAGNOSIS — Z86010 Personal history of colonic polyps: Secondary | ICD-10-CM

## 2017-04-20 DIAGNOSIS — Z6820 Body mass index (BMI) 20.0-20.9, adult: Secondary | ICD-10-CM

## 2017-04-20 DIAGNOSIS — K922 Gastrointestinal hemorrhage, unspecified: Secondary | ICD-10-CM | POA: Diagnosis present

## 2017-04-20 LAB — TSH: TSH: 2.983 u[IU]/mL (ref 0.350–4.500)

## 2017-04-20 LAB — CBC WITH DIFFERENTIAL/PLATELET
Basophils Absolute: 0.1 10*3/uL (ref 0.0–0.1)
Basophils Relative: 1 %
EOS ABS: 0.1 10*3/uL (ref 0.0–0.7)
Eosinophils Relative: 1 %
HCT: 26.6 % — ABNORMAL LOW (ref 39.0–52.0)
Hemoglobin: 9 g/dL — ABNORMAL LOW (ref 13.0–17.0)
LYMPHS ABS: 1.7 10*3/uL (ref 0.7–4.0)
LYMPHS PCT: 21 %
MCH: 33.1 pg (ref 26.0–34.0)
MCHC: 33.8 g/dL (ref 30.0–36.0)
MCV: 97.8 fL (ref 78.0–100.0)
MONO ABS: 0.9 10*3/uL (ref 0.1–1.0)
Monocytes Relative: 11 %
Neutro Abs: 5.3 10*3/uL (ref 1.7–7.7)
Neutrophils Relative %: 66 %
PLATELETS: 405 10*3/uL — AB (ref 150–400)
RBC: 2.72 MIL/uL — ABNORMAL LOW (ref 4.22–5.81)
RDW: 16.7 % — AB (ref 11.5–15.5)
WBC: 8 10*3/uL (ref 4.0–10.5)

## 2017-04-20 LAB — URINALYSIS, ROUTINE W REFLEX MICROSCOPIC
BILIRUBIN URINE: NEGATIVE
Glucose, UA: NEGATIVE mg/dL
Hgb urine dipstick: NEGATIVE
Ketones, ur: 5 mg/dL — AB
LEUKOCYTES UA: NEGATIVE
NITRITE: NEGATIVE
PH: 5 (ref 5.0–8.0)
Protein, ur: NEGATIVE mg/dL
Specific Gravity, Urine: 1.019 (ref 1.005–1.030)

## 2017-04-20 LAB — COMPREHENSIVE METABOLIC PANEL
ALT: 16 U/L — ABNORMAL LOW (ref 17–63)
ANION GAP: 5 (ref 5–15)
AST: 33 U/L (ref 15–41)
Albumin: 1.7 g/dL — ABNORMAL LOW (ref 3.5–5.0)
Alkaline Phosphatase: 119 U/L (ref 38–126)
BUN: 5 mg/dL — ABNORMAL LOW (ref 6–20)
CO2: 23 mmol/L (ref 22–32)
Calcium: 8 mg/dL — ABNORMAL LOW (ref 8.9–10.3)
Chloride: 107 mmol/L (ref 101–111)
Creatinine, Ser: 0.68 mg/dL (ref 0.61–1.24)
Glucose, Bld: 86 mg/dL (ref 65–99)
POTASSIUM: 3.4 mmol/L — AB (ref 3.5–5.1)
SODIUM: 135 mmol/L (ref 135–145)
Total Bilirubin: 1.1 mg/dL (ref 0.3–1.2)
Total Protein: 5.1 g/dL — ABNORMAL LOW (ref 6.5–8.1)

## 2017-04-20 LAB — MAGNESIUM: MAGNESIUM: 1.3 mg/dL — AB (ref 1.7–2.4)

## 2017-04-20 LAB — I-STAT CG4 LACTIC ACID, ED: Lactic Acid, Venous: 0.73 mmol/L (ref 0.5–1.9)

## 2017-04-20 MED ORDER — FOLIC ACID 1 MG PO TABS
1.0000 mg | ORAL_TABLET | Freq: Every day | ORAL | Status: DC
  Administered 2017-04-21 – 2017-04-25 (×5): 1 mg via ORAL
  Filled 2017-04-20 (×6): qty 1

## 2017-04-20 MED ORDER — PANTOPRAZOLE SODIUM 40 MG PO TBEC
40.0000 mg | DELAYED_RELEASE_TABLET | Freq: Two times a day (BID) | ORAL | Status: DC
  Administered 2017-04-21 – 2017-04-27 (×13): 40 mg via ORAL
  Filled 2017-04-20 (×13): qty 1

## 2017-04-20 MED ORDER — SODIUM CHLORIDE 0.9 % IV BOLUS (SEPSIS)
1000.0000 mL | Freq: Once | INTRAVENOUS | Status: AC
  Administered 2017-04-20: 1000 mL via INTRAVENOUS

## 2017-04-20 MED ORDER — MORPHINE SULFATE (PF) 4 MG/ML IV SOLN
4.0000 mg | Freq: Once | INTRAVENOUS | Status: AC
  Administered 2017-04-20: 4 mg via INTRAVENOUS
  Filled 2017-04-20: qty 1

## 2017-04-20 MED ORDER — ALBUTEROL SULFATE (2.5 MG/3ML) 0.083% IN NEBU: 2.5000 mg | INHALATION_SOLUTION | RESPIRATORY_TRACT | Status: DC | PRN

## 2017-04-20 MED ORDER — VITAMIN B-1 100 MG PO TABS
100.0000 mg | ORAL_TABLET | Freq: Every day | ORAL | Status: DC
  Administered 2017-04-21 – 2017-04-25 (×5): 100 mg via ORAL
  Filled 2017-04-20 (×6): qty 1

## 2017-04-20 MED ORDER — KCL IN DEXTROSE-NACL 20-5-0.45 MEQ/L-%-% IV SOLN
INTRAVENOUS | Status: DC
  Administered 2017-04-20 – 2017-04-21 (×2): via INTRAVENOUS
  Filled 2017-04-20 (×3): qty 1000

## 2017-04-20 MED ORDER — ENOXAPARIN SODIUM 40 MG/0.4ML ~~LOC~~ SOLN
40.0000 mg | SUBCUTANEOUS | Status: DC
  Administered 2017-04-21 – 2017-04-27 (×7): 40 mg via SUBCUTANEOUS
  Filled 2017-04-20 (×7): qty 0.4

## 2017-04-20 MED ORDER — ALBUTEROL SULFATE HFA 108 (90 BASE) MCG/ACT IN AERS: 2.0000 | INHALATION_SPRAY | RESPIRATORY_TRACT | Status: DC | PRN

## 2017-04-20 MED ORDER — FERROUS SULFATE 325 (65 FE) MG PO TABS
325.0000 mg | ORAL_TABLET | Freq: Three times a day (TID) | ORAL | Status: DC
  Administered 2017-04-21 – 2017-04-27 (×21): 325 mg via ORAL
  Filled 2017-04-20 (×21): qty 1

## 2017-04-20 MED ORDER — MORPHINE SULFATE (PF) 4 MG/ML IV SOLN
4.0000 mg | INTRAVENOUS | Status: AC | PRN
  Administered 2017-04-20 – 2017-04-21 (×2): 4 mg via INTRAVENOUS
  Filled 2017-04-20 (×2): qty 1

## 2017-04-20 MED ORDER — NICOTINE 14 MG/24HR TD PT24
14.0000 mg | MEDICATED_PATCH | Freq: Every day | TRANSDERMAL | Status: DC
  Administered 2017-04-21 – 2017-04-27 (×7): 14 mg via TRANSDERMAL
  Filled 2017-04-20 (×7): qty 1

## 2017-04-20 MED ORDER — SODIUM CHLORIDE 0.9 % IV SOLN: INTRAVENOUS | Status: DC

## 2017-04-20 NOTE — ED Notes (Signed)
Need 3 gold tubes per lab

## 2017-04-20 NOTE — H&P (Addendum)
History and Physical    JAZIEL BENNETT ELF:[id number] DOB: 08-03-1950 DOA: 04/20/2017  PCP: [name], MD  Patient coming from: home   Chief Complaint: weakness  HPI: CHAYNCE [name] is a 67 y.o. male with medical history significant for recent admission for weakness and failure to thrive, smoking, severe esophagitis causing upper GI bleed, sacral decubitus ulcers, alcohol abuse, malnutrition, presenting  Discharged from hospital 5 days ago. Had presented w/ a few months worsening fatigue and weakness. Hypotensive that admission, requiring pressors and icu stay. Recently diagnosed severe esophagitis, seen by GI. Found to be anemic during hospitalization. Also treated for possible aspiration pneumonia Transfused 3 units. Per patient GI thinks he may have an intestinal malignancy, recommending outpatient f/u.  Since returning home weakness persists. Unablet o get out of bed. PO has been decreased. Denies cough or chest pain. Denies abdominal pain. Denies hematochezia or melena. Denies hematemesis. Unsure what meds he is taking. Says feels very weak. Denies palpitations. Says son feels he can't care for him any more. Is incontinent to urine and stool.   ED Course: fluids, labs, imaging  Review of Systems: As per HPI otherwise 10 point review of systems negative.    Past Medical History:  Diagnosis Date  . Colon polyps   . Depression   . Diverticulitis   . Diverticulosis of colon   . GERD (gastroesophageal reflux disease)   . Kidney stone     Past Surgical History:  Procedure Laterality Date  . COLONOSCOPY    . ESOPHAGOGASTRODUODENOSCOPY (EGD) WITH PROPOFOL N/A 12/21/2016   Procedure: ESOPHAGOGASTRODUODENOSCOPY (EGD) WITH PROPOFOL;  Surgeon: Doran Stabler, MD;  Location: [facility];  Service: Gastroenterology;  Laterality: N/A;  . UPPER GASTROINTESTINAL ENDOSCOPY       reports that he has been smoking cigarettes.  He has been smoking about 1.00 pack per day. he has  never used smokeless tobacco. He reports that he drinks about 8.4 oz of alcohol per week. He reports that he does not use drugs.  No Known Allergies  Family History  Problem Relation Age of Onset  . Heart disease Father 74       cabg    Prior to Admission medications   Medication Sig Start Date End Date Taking? Authorizing Provider  amoxicillin-clavulanate (AUGMENTIN) 875-125 MG tablet Take 1 tablet by mouth every 12 (twelve) hours for 9 doses. 04/16/17 04/21/17 Yes Danford, Suann Larry, MD  pantoprazole (PROTONIX) 40 MG tablet Take 1 tablet (40 mg total) by mouth 2 (two) times daily. 12/01/16  Yes Short,  Delaine, MD  albuterol (VENTOLIN HFA) 108 (90 BASE) MCG/ACT inhaler Inhale 2 puffs into the lungs every 4 (four) hours as needed for wheezing or shortness of breath. Patient not taking: Reported on 04/09/2017 08/17/14   [name], MD  feeding supplement, ENSURE ENLIVE, (ENSURE ENLIVE) LIQD Take 237 mLs by mouth 2 (two) times daily between meals. Patient not taking: Reported on 01/04/2017 12/01/16   Janece Canterbury, MD  ferrous sulfate 325 (65 FE) MG tablet Take 1 tablet (325 mg total) by mouth 3 (three) times daily with meals. Patient not taking: Reported on 04/20/2017 12/01/16   Janece Canterbury, MD  folic acid (FOLVITE) 1 MG tablet Take 1 tablet (1 mg total) by mouth daily. Patient not taking: Reported on 04/09/2017 12/02/16   Janece Canterbury, MD  metoprolol tartrate (LOPRESSOR) 25 MG tablet Take 0.5 tablets (12.5 mg total) by mouth 2 (two) times daily. Patient not taking: Reported on 04/20/2017 01/08/17  Donne Hazel, MD  nicotine (NICODERM CQ - DOSED IN MG/24 HOURS) 21 mg/24hr patch Place 1 patch (21 mg total) onto the skin daily. Patient not taking: Reported on 04/20/2017 12/02/16   Janece Canterbury, MD  thiamine 100 MG tablet Take 1 tablet (100 mg total) by mouth daily. Patient not taking: Reported on 04/09/2017 12/02/16   Janece Canterbury, MD    Physical Exam: Vitals:    04/20/17 1532 04/20/17 1604 04/20/17 1700 04/20/17 2103  BP: 131/89 116/86 104/78 115/81  Pulse: (!) 134 (!) 131 (!) 120 (!) 108  Resp: 20 20  18   Temp:  97.8 F (36.6 C)    TempSrc:  Oral    SpO2: 99% 100% 98% 98%    Constitutional: No acute distress, thin and chronically ill appearing Head: Atraumatic Eyes: Conjunctiva clear ENM: dry mucous membranes. Poor dentition.  Neck: Supple Respiratory: Clear to auscultation bilaterally, no wheezing/rales/rhonchi. Normal respiratory effort. No accessory muscle use. . Cardiovascular: tachycardic, normal rhythm. No murmurs/rubs/gallops. Abdomen: Non-tender, mildly distended. No masses. No rebound or guarding. Positive bowel sounds. Musculoskeletal: No joint deformity upper and lower extremities. Normal ROM, no contractures. Decreased muscle tone Skin: erythema of sacrum, 2 small stage 2 ulcers Extremities: trace LE pitting edema. Warm Neurologic: Alert, moving all 4 extremities. Psychiatric: Normal insight and judgement. Flat affect.   Labs on Admission: I have personally reviewed following labs and imaging studies  CBC: Recent Labs  Lab 04/14/17 0429 04/15/17 0340 04/16/17 0602 04/20/17 1845  WBC 6.9 3.5* 4.6 8.0  NEUTROABS  --   --   --  5.3  HGB 7.9* 7.3* 8.6* 9.0*  HCT 24.9* 23.5* 27.1* 26.6*  MCV 99.2 100.4* 99.3 97.8  PLT 171 187 251 716*   Basic Metabolic Panel: Recent Labs  Lab 04/14/17 0429 04/15/17 0340 04/16/17 0602 04/20/17 1845  NA 135 133* 138 135  K 4.2 4.0 4.3 3.4*  CL 107 107 108 107  CO2 22 22 25 23   GLUCOSE 89 78 74 86  BUN 5* 5* <5* 5*  CREATININE 0.69 0.65 0.72 0.68  CALCIUM 8.2* 8.3* 8.3* 8.0*  MG  --   --   --  1.3*   GFR: Estimated Creatinine Clearance: 78.4 mL/min (by C-G formula based on SCr of 0.68 mg/dL). Liver Function Tests: Recent Labs  Lab 04/20/17 1845  AST 33  ALT 16*  ALKPHOS 119  BILITOT 1.1  PROT 5.1*  ALBUMIN 1.7*   No results for input(s): LIPASE, AMYLASE in the last  168 hours. No results for input(s): AMMONIA in the last 168 hours. Coagulation Profile: No results for input(s): INR, PROTIME in the last 168 hours. Cardiac Enzymes: No results for input(s): CKTOTAL, CKMB, CKMBINDEX, TROPONINI in the last 168 hours. BNP (last 3 results) No results for input(s): PROBNP in the last 8760 hours. HbA1C: No results for input(s): HGBA1C in the last 72 hours. CBG: No results for input(s): GLUCAP in the last 168 hours. Lipid Profile: No results for input(s): CHOL, HDL, LDLCALC, TRIG, CHOLHDL, LDLDIRECT in the last 72 hours. Thyroid Function Tests: No results for input(s): TSH, T4TOTAL, FREET4, T3FREE, THYROIDAB in the last 72 hours. Anemia Panel: No results for input(s): VITAMINB12, FOLATE, FERRITIN, TIBC, IRON, RETICCTPCT in the last 72 hours. Urine analysis:    Component Value Date/Time   COLORURINE AMBER (A) 04/20/2017 1856   APPEARANCEUR CLEAR 04/20/2017 1856   LABSPEC 1.019 04/20/2017 1856   PHURINE 5.0 04/20/2017 1856   GLUCOSEU NEGATIVE 04/20/2017 1856   HGBUR NEGATIVE  04/20/2017 1856   HGBUR negative 02/21/2010 1055   BILIRUBINUR NEGATIVE 04/20/2017 1856   BILIRUBINUR n 03/11/2014 1025   KETONESUR 5 (A) 04/20/2017 1856   PROTEINUR NEGATIVE 04/20/2017 1856   UROBILINOGEN 0.2 03/11/2014 1025   UROBILINOGEN 0.2 02/21/2010 1055   NITRITE NEGATIVE 04/20/2017 1856   LEUKOCYTESUR NEGATIVE 04/20/2017 1856    Radiological Exams on Admission: Dg Chest Port 1 View  Result Date: 04/20/2017 CLINICAL DATA:  Weakness.  Shortness of breath.  Possible fever. EXAM: PORTABLE CHEST 1 VIEW COMPARISON:  04/13/2017 FINDINGS: Cardiomediastinal silhouette is normal. Mediastinal contours appear intact. Mild calcific atherosclerotic disease of the aorta. There is no evidence of focal airspace consolidation, pleural effusion or pneumothorax. Osseous structures are without acute abnormality. Soft tissues are grossly normal. IMPRESSION: No active disease. Electronically  Signed   By: Fidela Salisbury M.D.   On: 04/20/2017 16:54    EKG: Independently reviewed. Tachycardic. Non-ischemic.   Assessment/Plan Active Problems:   TOBACCO USE   Hypertension   Alcohol dependence (HCC)   Hypokalemia   Erosive esophagitis   Pressure ulcer of sacral region, stage 2   Dysphagia   Upper GI bleed   FTT (failure to thrive) in adult   Protein-calorie malnutrition, severe   Failure to thrive in adult   # Failure to thrive - etiology unclear. Recent deconditioning. Albumin very low. BP low normal. Appears mildly dehydrated, no AKI. Recent upper GI bleed likely 2/2 esophagitis. H/H stable from discharge, actually mildly improved. K mildly low, Mg pending, Na wnl. Unable to care for self at home. CT abdomen/pelvis negative 12/2016. CTA chest no significant findings 11/2016. Normal b12 and folate recently. hiv and hcv and hbv neg - will need case mgmt assistance for placement - nutrition consult - outpatient GI f/u for possible malignancy w/u - IV fluids as below - f/u tsh and crp and rpr   # Sinus tachycardia - likely 2/2 dehydration, FTT - fluids as below, monitor  # Esophagitis - H/H stable - continue home pantoprazole  # hypokalemia - mild. More significant recent hospitalization. Na low but wnl - f/u Mg - d51/2NS w/ 20 meq kcl - f/u am labs  # Smoker - 1/2 ppd - nicotine patch  # Sacral decubitus ulcer - stage 2 - wound and nutrition consults  # Hypertension - here bp low normal - hold home beta blocker  # Alcohol abuse - says drinking 1 drink/day lately. No signs withdrawal recent admission - monitor  DVT prophylaxis: lovenox, scds Code Status: dnr, confirmed w/ patient  Family Communication: ex wife kelly Kong [phone]  Disposition Plan: likely snf  Consults called: none  Admission status: med/surg    Desma Maxim MD [facility] Pager [phone]  If 7PM-7AM, please contact night-coverage [email or web address] Password  [email or web address]  04/20/2017, 9:30 PM

## 2017-04-20 NOTE — ED Triage Notes (Signed)
Per EMS-states increased coccyx pain and decreased mobility-was seen by [facility] RN who referred to ED for placement-states son lives with patient but in unwilling to take care of patient

## 2017-04-20 NOTE — ED Provider Notes (Signed)
[facility] DEPT Provider Note   CSN: [id number] Arrival date & time: 04/20/17  1208     History   Chief Complaint Chief Complaint  Patient presents with  . Tailbone Pain  . Extremity Weakness    HPI Patrick [name] is a 67 y.o. male.  67 year old male presents with increasing weakness times several days.  Was recent admitted to the hospital for a week and discharged 5 days ago.  States that since that time he has had poor oral intake as well as decreased ability to ambulate.  Patient did not relate with a walker but now cannot do that.  Has had some cough and congestion but denies any fever.  No chest or abdominal discomfort.  Denies any urinary symptoms.  No severe headaches.  No recent medication changes.  Home health aide saw the patient today and referred him to the ED for further evaluation.      Past Medical History:  Diagnosis Date  . Colon polyps   . Depression   . Diverticulitis   . Diverticulosis of colon   . GERD (gastroesophageal reflux disease)   . Kidney stone     Patient Active Problem List   Diagnosis Date Noted  . Adjustment disorder with depressed mood 04/15/2017  . Protein-calorie malnutrition, severe 04/12/2017  . Palliative care by specialist   . DNR (do not resuscitate)   . GI bleed 04/10/2017  . FTT (failure to thrive) in adult 04/09/2017  . Generalized weakness   . Alcohol withdrawal ([place]) 01/04/2017  . Esophagitis 01/04/2017  . Decubitus ulcer of buttock, stage 1   . Upper GI bleed 12/20/2016  . AKI (acute kidney injury) ([place]) 12/20/2016  . 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 Encounter for palliative care   . Goals of care, counseling/discussion   . Dysphagia 11/30/2016  . Symptomatic anemia 11/25/2016  . Pressure ulcer of sacral region, stage 2 11/25/2016  . Alcohol abuse 11/25/2016  . Hypotension 11/25/2016  . Hypomagnesemia 11/25/2016  . Hyponatremia 11/25/2016  . Non-traumatic  rhabdomyolysis 11/25/2016  . Erosive esophagitis 05/14/2015  . Hypokalemia 04/30/2015  . Thrombocytopenia ([place]) 04/16/2015  . Alcohol dependence ([place]) 03/17/2015  . Change in bowel habits 02/23/2015  . Rectal bleeding 02/23/2015  . Constipation 02/23/2015  . Transaminasemia 03/27/2013  . Essential tremor 03/27/2013  . Hypertension 07/19/2012  . TOBACCO USE 03/01/2010  . DIVERTICULOSIS, COLON 05/14/2007  . DEPRESSION 11/05/2006  . COLONIC POLYPS, HX OF 11/05/2006    Past Surgical History:  Procedure Laterality Date  . COLONOSCOPY    . ESOPHAGOGASTRODUODENOSCOPY (EGD) WITH PROPOFOL N/A 12/21/2016   Procedure: ESOPHAGOGASTRODUODENOSCOPY (EGD) WITH PROPOFOL;  Surgeon: Doran Stabler, MD;  Location: [facility];  Service: Gastroenterology;  Laterality: N/A;  . UPPER GASTROINTESTINAL ENDOSCOPY         Home Medications    Prior to Admission medications   Medication Sig Start Date End Date Taking? Authorizing Provider  albuterol (VENTOLIN HFA) 108 (90 BASE) MCG/ACT inhaler Inhale 2 puffs into the lungs every 4 (four) hours as needed for wheezing or shortness of breath. Patient not taking: Reported on 04/09/2017 08/17/14   [name], MD  amoxicillin-clavulanate (AUGMENTIN) 875-125 MG tablet Take 1 tablet by mouth every 12 (twelve) hours for 9 doses. 04/16/17 04/21/17  Danford, Suann Larry, MD  feeding supplement, ENSURE ENLIVE, (ENSURE ENLIVE) LIQD Take 237 mLs by mouth 2 (two) times daily between meals. Patient not taking: Reported on 01/04/2017 12/01/16   Short,  Noah Delaine, MD  ferrous sulfate 325 (65 FE) MG tablet Take 1 tablet (325 mg total) by mouth 3 (three) times daily with meals. Patient not taking: Reported on 04/09/2017 12/01/16   Janece Canterbury, MD  folic acid (FOLVITE) 1 MG tablet Take 1 tablet (1 mg total) by mouth daily. Patient not taking: Reported on 04/09/2017 12/02/16   Janece Canterbury, MD  metoprolol tartrate (LOPRESSOR) 25 MG tablet Take 0.5 tablets (12.5  mg total) by mouth 2 (two) times daily. Patient not taking: Reported on 04/09/2017 01/08/17   Donne Hazel, MD  nicotine (NICODERM CQ - DOSED IN MG/24 HOURS) 21 mg/24hr patch Place 1 patch (21 mg total) onto the skin daily. 12/02/16   Janece Canterbury, MD  pantoprazole (PROTONIX) 40 MG tablet Take 1 tablet (40 mg total) by mouth 2 (two) times daily. 12/01/16   Janece Canterbury, MD  thiamine 100 MG tablet Take 1 tablet (100 mg total) by mouth daily. Patient not taking: Reported on 04/09/2017 12/02/16   Janece Canterbury, MD    Family History Family History  Problem Relation Age of Onset  . Heart disease Father 93       cabg    Social History Social History   Tobacco Use  . Smoking status: Current Every Day Smoker    Packs/day: 1.00    Types: Cigarettes  . Smokeless tobacco: Never Used  Substance Use Topics  . Alcohol use: Yes    Alcohol/week: 8.4 oz    Types: 14 Glasses of wine per week  . Drug use: No     Allergies   Patient has no known allergies.   Review of Systems Review of Systems  All other systems reviewed and are negative.    Physical Exam Updated Vital Signs BP 116/86 (BP Location: Left Arm)   Pulse (!) 131   Temp 97.8 F (36.6 C) (Oral)   Resp 20   SpO2 100%   Physical Exam  Constitutional: He is oriented to person, place, and time. He appears cachectic.  Non-toxic appearance. No distress.  HENT:  Head: Normocephalic and atraumatic.  Eyes: Conjunctivae, EOM and lids are normal. Pupils are equal, round, and reactive to light.  Neck: Normal range of motion. Neck supple. No tracheal deviation present. No thyroid mass present.  Cardiovascular: Regular rhythm and normal heart sounds. Tachycardia present. Exam reveals no gallop.  No murmur heard. Pulmonary/Chest: Effort normal and breath sounds normal. No stridor. No respiratory distress. He has no decreased breath sounds. He has no wheezes. He has no rhonchi. He has no rales.  Abdominal: Soft. Normal  appearance and bowel sounds are normal. He exhibits no distension. There is no tenderness. There is no rebound and no CVA tenderness.  Musculoskeletal: Normal range of motion. He exhibits no edema or tenderness.  Neurological: He is alert and oriented to person, place, and time. He has normal strength. No cranial nerve deficit or sensory deficit. GCS eye subscore is 4. GCS verbal subscore is 5. GCS motor subscore is 6.  Skin: Skin is warm and dry. No abrasion and no rash noted. There is pallor.  Psychiatric: He has a normal mood and affect. His speech is normal and behavior is normal.  Nursing note and vitals reviewed.    ED Treatments / Results  Labs (all labs ordered are listed, but only abnormal results are displayed) Labs Reviewed  CBC WITH DIFFERENTIAL/PLATELET  COMPREHENSIVE METABOLIC PANEL  URINALYSIS, ROUTINE W REFLEX MICROSCOPIC  I-STAT CG4 LACTIC ACID, ED    EKG  EKG Interpretation  Date/Time:  Friday April 20 2017 16:22:47 EST Ventricular Rate:  128 PR Interval:    QRS Duration: 88 QT Interval:  333 QTC Calculation: 486 R Axis:   87 Text Interpretation:  Sinus tachycardia Ventricular premature complex Aberrant complex Borderline right axis deviation Low voltage, precordial leads Borderline T abnormalities, anterior leads Borderline prolonged QT interval Confirmed by Lacretia Leigh ([id number]) on 04/20/2017 4:28:08 PM       Radiology No results found.  Procedures Procedures (including critical care time)  Medications Ordered in ED Medications  sodium chloride 0.9 % bolus 1,000 mL (not administered)  0.9 %  sodium chloride infusion (not administered)     Initial Impression / Assessment and Plan / ED Course  I have reviewed the triage vital signs and the nursing notes.  Pertinent labs & imaging results that were available during my care of the patient were reviewed by me and considered in my medical decision making (see chart for details).     Given IV  fluids but has persistent tachycardia.  Complains of diffuse weakness.  Will consult hospitalist for admission  Final Clinical Impressions(s) / ED Diagnoses   Final diagnoses:  None    ED Discharge Orders    None       Lacretia Leigh, MD 04/20/17 2107

## 2017-04-20 NOTE — ED Notes (Signed)
ED TO INPATIENT HANDOFF REPORT  Name/Age/Gender Patrick [name] 67 y.o. male  Code Status Code Status History    Date Active Date Inactive Code Status Order ID Comments User Context   04/11/2017 15:10 04/17/2017 02:07 DNR [id number]  Knox Royalty, NP Inpatient   04/10/2017 11:14 04/11/2017 15:10 Full Code [id number]  Sampson Goon, MD ED   04/09/2017 21:03 04/10/2017 11:14 Full Code [id number]  Phillips Grout, MD ED   01/04/2017 07:46 01/08/2017 21:46 Full Code [id number]  Reyne Dumas, MD ED   12/20/2016 16:38 12/23/2016 22:07 Full Code [id number]  Annita Brod, MD Inpatient   11/25/2016 04:58 12/01/2016 18:33 Full Code [id number]  Norval Morton, MD ED   11/25/2016 01:40 11/25/2016 04:58 Full Code [id number]  Antonietta Breach, PA-C ED    Questions for Most Recent Historical Code Status (Order [id number])    Question Answer Comment   In the event of cardiac or respiratory ARREST Do not call a "code blue"    In the event of cardiac or respiratory ARREST Do not perform Intubation, CPR, defibrillation or ACLS    In the event of cardiac or respiratory ARREST Use medication by any route, position, wound care, and other measures to relive pain and suffering. May use oxygen, suction and manual treatment of airway obstruction as needed for comfort.       Home/SNF/Other    Chief Complaint weakness   Level of Care/Admitting Diagnosis ED Disposition    ED Disposition Condition Comment   Admit  Hospital Area: [facility] [[id number]]  Level of Care: Med-Surg [16]  Diagnosis: Failure to thrive in adult [220254]  Admitting Physician: Eston Esters  Attending Physician: Gwynne Edinger [[id number]]  Estimated length of stay: past midnight tomorrow  Certification:: I certify this patient will need inpatient services for at least 2 midnights  PT Class (Do Not Modify): Inpatient [101]  PT Acc Code (Do Not Modify): Private [1]       Medical History Past Medical History:   Diagnosis Date  . Colon polyps   . Depression   . Diverticulitis   . Diverticulosis of colon   . GERD (gastroesophageal reflux disease)   . Kidney stone     Allergies No Known Allergies  IV Location/Drains/Wounds Patient Lines/Drains/Airways Status   Active Line/Drains/Airways    Name:   Placement date:   Placement time:   Site:   Days:   Peripheral IV 04/20/17 Left Antecubital   04/20/17    1845    Antecubital   less than 1   Pressure Injury 11/25/16 Stage I -  Intact skin with non-blanchable redness of a localized area usually over a bony prominence.   11/25/16    0630     146   Pressure Injury 04/10/17 Stage II -  Partial thickness loss of dermis presenting as a shallow open ulcer with a red, pink wound bed without slough. 3cm x 1cm area of blanchable redness with 2 small stage 2 ulcers in the mid sacral area   04/10/17    1330     10   Pressure Injury 04/10/17 Stage II -  Partial thickness loss of dermis presenting as a shallow open ulcer with a red, pink wound bed without slough. this is multiple areas of full thickness loss related to moisture associated skin damage   04/10/17    1330     10          Labs/Imaging Results for orders  placed or performed during the hospital encounter of 04/20/17 (from the past 48 hour(s))  CBC with Differential/Platelet     Status: Abnormal   Collection Time: 04/20/17  6:45 PM  Result Value Ref Range   WBC 8.0 4.0 - 10.5 K/uL   RBC 2.72 (L) 4.22 - 5.81 MIL/uL   Hemoglobin 9.0 (L) 13.0 - 17.0 g/dL   HCT 26.6 (L) 39.0 - 52.0 %   MCV 97.8 78.0 - 100.0 fL   MCH 33.1 26.0 - 34.0 pg   MCHC 33.8 30.0 - 36.0 g/dL   RDW 16.7 (H) 11.5 - 15.5 %   Platelets 405 (H) 150 - 400 K/uL   Neutrophils Relative % 66 %   Neutro Abs 5.3 1.7 - 7.7 K/uL   Lymphocytes Relative 21 %   Lymphs Abs 1.7 0.7 - 4.0 K/uL   Monocytes Relative 11 %   Monocytes Absolute 0.9 0.1 - 1.0 K/uL   Eosinophils Relative 1 %   Eosinophils Absolute 0.1 0.0 - 0.7 K/uL   Basophils  Relative 1 %   Basophils Absolute 0.1 0.0 - 0.1 K/uL  Comprehensive metabolic panel     Status: Abnormal   Collection Time: 04/20/17  6:45 PM  Result Value Ref Range   Sodium 135 135 - 145 mmol/L   Potassium 3.4 (L) 3.5 - 5.1 mmol/L   Chloride 107 101 - 111 mmol/L   CO2 23 22 - 32 mmol/L   Glucose, Bld 86 65 - 99 mg/dL   BUN 5 (L) 6 - 20 mg/dL   Creatinine, Ser 0.68 0.61 - 1.24 mg/dL   Calcium 8.0 (L) 8.9 - 10.3 mg/dL   Total Protein 5.1 (L) 6.5 - 8.1 g/dL   Albumin 1.7 (L) 3.5 - 5.0 g/dL   AST 33 15 - 41 U/L   ALT 16 (L) 17 - 63 U/L   Alkaline Phosphatase 119 38 - 126 U/L   Total Bilirubin 1.1 0.3 - 1.2 mg/dL   GFR calc non Af Amer >60 >60 mL/min   GFR calc Af Amer >60 >60 mL/min    Comment: (NOTE) The eGFR has been calculated using the CKD EPI equation. This calculation has not been validated in all clinical situations. eGFR's persistently <60 mL/min signify possible Chronic Kidney Disease.    Anion gap 5 5 - 15  Magnesium     Status: Abnormal   Collection Time: 04/20/17  6:45 PM  Result Value Ref Range   Magnesium 1.3 (L) 1.7 - 2.4 mg/dL  Urinalysis, Routine w reflex microscopic     Status: Abnormal   Collection Time: 04/20/17  6:56 PM  Result Value Ref Range   Color, Urine AMBER (A) YELLOW    Comment: BIOCHEMICALS MAY BE AFFECTED BY COLOR   APPearance CLEAR CLEAR   Specific Gravity, Urine 1.019 1.005 - 1.030   pH 5.0 5.0 - 8.0   Glucose, UA NEGATIVE NEGATIVE mg/dL   Hgb urine dipstick NEGATIVE NEGATIVE   Bilirubin Urine NEGATIVE NEGATIVE   Ketones, ur 5 (A) NEGATIVE mg/dL   Protein, ur NEGATIVE NEGATIVE mg/dL   Nitrite NEGATIVE NEGATIVE   Leukocytes, UA NEGATIVE NEGATIVE  I-Stat CG4 Lactic Acid, ED     Status: None   Collection Time: 04/20/17  6:59 PM  Result Value Ref Range   Lactic Acid, Venous 0.73 0.5 - 1.9 mmol/L   Dg Chest Port 1 View  Result Date: 04/20/2017 CLINICAL DATA:  Weakness.  Shortness of breath.  Possible fever. EXAM: PORTABLE CHEST 1 VIEW  COMPARISON:  04/13/2017 FINDINGS: Cardiomediastinal silhouette is normal. Mediastinal contours appear intact. Mild calcific atherosclerotic disease of the aorta. There is no evidence of focal airspace consolidation, pleural effusion or pneumothorax. Osseous structures are without acute abnormality. Soft tissues are grossly normal. IMPRESSION: No active disease. Electronically Signed   By: Fidela Salisbury M.D.   On: 04/20/2017 16:54    Pending Labs Unresulted Labs (From admission, onward)   Start     Ordered   04/20/17 2153  RPR  Add-on,   R     04/20/17 2152   04/20/17 2151  TSH  Add-on,   R     04/20/17 2150   04/20/17 2151  C-reactive protein  Add-on,   R     04/20/17 2150   Signed and Held  CBC  (enoxaparin (LOVENOX)    CrCl >/= 30 ml/min)  Once,   R    Comments:  Baseline for enoxaparin therapy IF NOT ALREADY DRAWN.  Notify MD if PLT < 100 K.    Signed and Held   Signed and Held  Creatinine, serum  (enoxaparin (LOVENOX)    CrCl >/= 30 ml/min)  Once,   R    Comments:  Baseline for enoxaparin therapy IF NOT ALREADY DRAWN.    Signed and Held   Signed and Held  Creatinine, serum  (enoxaparin (LOVENOX)    CrCl >/= 30 ml/min)  Weekly,   R    Comments:  while on enoxaparin therapy    Signed and Held   Signed and Held  Comprehensive metabolic panel  Tomorrow morning,   R     Signed and Held   Signed and Held  CBC  Tomorrow morning,   R     Signed and Held   Signed and Held  Magnesium  Tomorrow morning,   R     Signed and Held      Vitals/Pain Today's Vitals   04/20/17 2155 04/20/17 2155 04/20/17 2155 04/20/17 2215  BP:    96/66  Pulse:   99 (!) 117  Resp:   18   Temp:      TempSrc:      SpO2:    100%  PainSc: 6  6       Isolation Precautions No active isolations  Medications Medications  0.9 %  sodium chloride infusion (not administered)  sodium chloride 0.9 % bolus 1,000 mL (0 mLs Intravenous Stopped 04/20/17 2156)  morphine 4 MG/ML injection 4 mg (4 mg Intravenous  Given 04/20/17 1909)    Mobility

## 2017-04-20 NOTE — Telephone Encounter (Signed)
Attempted to reach patient X 3 days unable, will close encounter.

## 2017-04-21 ENCOUNTER — Other Ambulatory Visit: Payer: Self-pay

## 2017-04-21 LAB — COMPREHENSIVE METABOLIC PANEL
ALT: 15 U/L — ABNORMAL LOW (ref 17–63)
AST: 29 U/L (ref 15–41)
Albumin: 1.5 g/dL — ABNORMAL LOW (ref 3.5–5.0)
Alkaline Phosphatase: 96 U/L (ref 38–126)
Anion gap: 4 — ABNORMAL LOW (ref 5–15)
BUN: <5 mg/dL — ABNORMAL LOW (ref 6–20)
CO2: 22 mmol/L (ref 22–32)
Calcium: 7.6 mg/dL — ABNORMAL LOW (ref 8.9–10.3)
Chloride: 104 mmol/L (ref 101–111)
Creatinine, Ser: 0.65 mg/dL (ref 0.61–1.24)
GFR calc Af Amer: >60 mL/min (ref >60)
GFR calc non Af Amer: >60 mL/min (ref >60)
Glucose, Bld: 90 mg/dL (ref 65–99)
Potassium: 3.4 mmol/L — ABNORMAL LOW (ref 3.5–5.1)
Sodium: 130 mmol/L — ABNORMAL LOW (ref 135–145)
Total Bilirubin: 0.7 mg/dL (ref 0.3–1.2)
Total Protein: 4.5 g/dL — ABNORMAL LOW (ref 6.5–8.1)

## 2017-04-21 LAB — RPR: RPR Ser Ql: NONREACTIVE

## 2017-04-21 LAB — CBC
HCT: 24.8 % — ABNORMAL LOW (ref 39.0–52.0)
Hemoglobin: 8.4 g/dL — ABNORMAL LOW (ref 13.0–17.0)
MCH: 33.6 pg (ref 26.0–34.0)
MCHC: 33.9 g/dL (ref 30.0–36.0)
MCV: 99.2 fL (ref 78.0–100.0)
Platelets: 345 10*3/uL (ref 150–400)
RBC: 2.5 MIL/uL — ABNORMAL LOW (ref 4.22–5.81)
RDW: 16.9 % — ABNORMAL HIGH (ref 11.5–15.5)
WBC: 4.9 10*3/uL (ref 4.0–10.5)

## 2017-04-21 LAB — C-REACTIVE PROTEIN: CRP: 3.9 mg/dL — AB (ref <1.0)

## 2017-04-21 LAB — MAGNESIUM: MAGNESIUM: 1.2 mg/dL — AB (ref 1.7–2.4)

## 2017-04-21 MED ORDER — SODIUM CHLORIDE 0.9 % IV SOLN
INTRAVENOUS | Status: DC
  Administered 2017-04-21 – 2017-04-25 (×5): via INTRAVENOUS

## 2017-04-21 MED ORDER — GERHARDT'S BUTT CREAM
TOPICAL_CREAM | Freq: Three times a day (TID) | CUTANEOUS | Status: DC
  Administered 2017-04-21 – 2017-04-25 (×12): via TOPICAL
  Administered 2017-04-25: 1 via TOPICAL
  Administered 2017-04-25: 17:00:00 via TOPICAL
  Administered 2017-04-26: 1 via TOPICAL
  Administered 2017-04-26 (×2): via TOPICAL
  Filled 2017-04-21: qty 1

## 2017-04-21 MED ORDER — FLUCONAZOLE 100 MG PO TABS
100.0000 mg | ORAL_TABLET | Freq: Every day | ORAL | Status: AC
  Administered 2017-04-21 – 2017-04-27 (×7): 100 mg via ORAL
  Filled 2017-04-21 (×7): qty 1

## 2017-04-21 MED ORDER — POTASSIUM CHLORIDE CRYS ER 20 MEQ PO TBCR
20.0000 meq | EXTENDED_RELEASE_TABLET | Freq: Every day | ORAL | Status: DC
  Administered 2017-04-21 – 2017-04-23 (×3): 20 meq via ORAL
  Filled 2017-04-21 (×4): qty 1

## 2017-04-21 MED ORDER — BOOST / RESOURCE BREEZE PO LIQD CUSTOM
1.0000 | Freq: Three times a day (TID) | ORAL | Status: DC
  Administered 2017-04-21 – 2017-04-22 (×2): 1 via ORAL

## 2017-04-21 MED ORDER — MORPHINE SULFATE (PF) 4 MG/ML IV SOLN
4.0000 mg | INTRAVENOUS | Status: AC | PRN
  Administered 2017-04-21 – 2017-04-22 (×2): 4 mg via INTRAVENOUS
  Filled 2017-04-21 (×2): qty 1

## 2017-04-21 MED ORDER — MAGNESIUM OXIDE 400 (241.3 MG) MG PO TABS
400.0000 mg | ORAL_TABLET | Freq: Every day | ORAL | Status: DC
  Administered 2017-04-21 – 2017-04-25 (×5): 400 mg via ORAL
  Filled 2017-04-21 (×6): qty 1

## 2017-04-21 MED ORDER — HYOSCYAMINE SULFATE 0.5 MG/ML IJ SOLN
0.5000 mg | INTRAMUSCULAR | Status: DC | PRN
  Filled 2017-04-21: qty 1

## 2017-04-21 NOTE — Progress Notes (Signed)
[facility] PROGRESS NOTE  LINTON STOLP GMW:[id number] DOB: 1950/05/03 DOA: 04/20/2017 PCP: [name], MD  Interim summary and history of present illness 67 year old male with a past medical history significant for protein calorie malnutrition, tobacco abuse, alcohol abuse, hypertension and a recent admission secondary to esophagitis with upper GI bleed and aspiration pneumonia.  Who presented to the hospital secondary to generalized weakness and failure to thrive. Family unable to care after him.  Assessment/Plan: 1-failure to thrive and deconditioning -Appears to be secondary to physical decline  -patient with recent PNA and hospitalization -will have PT/OT evaluation -SW/CM on board to assist with safe discharge  2-dehydration/hyponatremia/sinus tachycardia -Will provide fluid resuscitation -Follow VS and electrolyte trend   3-recent esophagitis with GI bleed -No signs of overt bleeding currently -Continue PPI  4-hypokalemia -Daily supplementation with potassium and magnesium initiated. -Follow trend and further replete as needed.  5-HTN -soft BP appreciated -will hold antihypertensive regimen for now -if needed will add low dose b-blocker for rebound tachycardia (he was on it at home)  6-sacral decubitus ulcer : stage 2 -wound care consulted -will follow rec's -treat with diflucan for 7 days (fungal dermatitis) -Consult repositioning and physical measures for breakdown prevention (barrier creams and the use of air mattress).  7-chronic severe protein calorie malnutrition -Nutritional service has been consulted -Started on boost 3 times a day  8-alcohol abuse antibiotic abuse -Cessation counseling has been provided -Patient reports since last admission he had not been drinking -No history or signs of active withdrawal -Nicotine patch has been ordered.  Code Status: DNR Family Communication: No family at bedside Disposition Plan: Will keep in the  hospital, continue IV fluids, initiate feeding supplements, follow dietitian recommendation and have him evaluated by physical therapy and occupational therapy.   Consultants: None  Procedures:  See below for x-ray reports.  Antibiotics:  None   HPI/Subjective: Afebrile, no chest pain, no shortness of breath, no nausea or vomiting.  Patient reporting pain in his lower back and reporting weakness/deconditioning, to the point of being unable to perform any activities.  Objective: Vitals:   04/21/17 0800 04/21/17 1434  BP: 107/66 107/68  Pulse: (!) 103 (!) 114  Resp: 16 16  Temp: 98.3 F (36.8 C) 99.5 F (37.5 C)  SpO2: 99% 100%    Intake/Output Summary (Last 24 hours) at 04/21/2017 1508 Last data filed at 04/21/2017 1300 Gross per 24 hour  Intake 1585 ml  Output 1025 ml  Net 560 ml   There were no vitals filed for this visit.  Exam:   General: Afebrile, no chest pain, no shortness of breath.  Patient reporting pain in his lower back and reporting feeling significantly weak and unable to perform any activities.  Cardiovascular: S1 and S2, no rubs, no gallops, no JVD appreciated on exam.  Respiratory: Good air movement bilaterally, no wheezing, no crackles.  Abdomen: Soft, nontender, nondistended, positive bowel sounds.  Musculoskeletal: 1-2+ edema bilaterally, no cyanosis or clubbing.  Skin: Patient with stage II decubitus ulcer present since the moment of admission, with some surrounding redness and serosanguineous drainage (with concern for fungal dermatitis).  Data Reviewed: Basic Metabolic Panel: Recent Labs  Lab 04/15/17 0340 04/16/17 0602 04/20/17 1845 04/21/17 0607  NA 133* 138 135 130*  K 4.0 4.3 3.4* 3.4*  CL 107 108 107 104  CO2 22 25 23 22   GLUCOSE 78 74 86 90  BUN 5* <5* 5* <5*  CREATININE 0.65 0.72 0.68 0.65  CALCIUM 8.3* 8.3* 8.0*  7.6*  MG  --   --  1.3* 1.2*   Liver Function Tests: Recent Labs  Lab 04/20/17 1845 04/21/17 0607  AST  33 29  ALT 16* 15*  ALKPHOS 119 96  BILITOT 1.1 0.7  PROT 5.1* 4.5*  ALBUMIN 1.7* 1.5*   CBC: Recent Labs  Lab 04/15/17 0340 04/16/17 0602 04/20/17 1845 04/21/17 0607  WBC 3.5* 4.6 8.0 4.9  NEUTROABS  --   --  5.3  --   HGB 7.3* 8.6* 9.0* 8.4*  HCT 23.5* 27.1* 26.6* 24.8*  MCV 100.4* 99.3 97.8 99.2  PLT 187 251 405* 345   CBG: No results for input(s): GLUCAP in the last 168 hours.  Recent Results (from the past 240 hour(s))  Culture, blood (routine x 2)     Status: None   Collection Time: 04/13/17 10:58 PM  Result Value Ref Range Status   Specimen Description BLOOD RIGHT HAND  Final   Special Requests IN PEDIATRIC BOTTLE Blood Culture adequate volume  Final   Culture NO GROWTH 5 DAYS  Final   Report Status 04/19/2017 FINAL  Final  Culture, blood (routine x 2)     Status: None   Collection Time: 04/13/17 11:02 PM  Result Value Ref Range Status   Specimen Description BLOOD LEFT HAND  Final   Special Requests IN PEDIATRIC BOTTLE Blood Culture adequate volume  Final   Culture NO GROWTH 5 DAYS  Final   Report Status 04/19/2017 FINAL  Final     Studies: Dg Chest Port 1 View  Result Date: 04/20/2017 CLINICAL DATA:  Weakness.  Shortness of breath.  Possible fever. EXAM: PORTABLE CHEST 1 VIEW COMPARISON:  04/13/2017 FINDINGS: Cardiomediastinal silhouette is normal. Mediastinal contours appear intact. Mild calcific atherosclerotic disease of the aorta. There is no evidence of focal airspace consolidation, pleural effusion or pneumothorax. Osseous structures are without acute abnormality. Soft tissues are grossly normal. IMPRESSION: No active disease. Electronically Signed   By: Fidela Salisbury M.D.   On: 04/20/2017 16:54    Scheduled Meds: . enoxaparin (LOVENOX) injection  40 mg Subcutaneous Q24H  . ferrous sulfate  325 mg Oral TID WC  . fluconazole  100 mg Oral Daily  . folic acid  1 mg Oral Daily  . Gerhardt's butt cream   Topical TID  . nicotine  14 mg Transdermal  Daily  . pantoprazole  40 mg Oral BID  . thiamine  100 mg Oral Daily   Continuous Infusions: . dextrose 5 % and 0.45 % NaCl with KCl 20 mEq/L 100 mL/hr at 04/21/17 0950    Time spent: 25 minutes    [facility] [facility] Pager [phone]. If 7PM-7AM, please contact night-coverage at [email or web address], password [facility] 04/21/2017, 3:08 PM  LOS: 1 day

## 2017-04-21 NOTE — Progress Notes (Signed)
Patient complain of upper gastric pain. Patient states he feels some better when he sits straight up. Patient requesting pain medication. On call notified via text page.

## 2017-04-21 NOTE — Consult Note (Addendum)
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 Last seen by my partner, D. Engels on 04/11/17. Wound type:Moisture Associated Skin Damage, specifically incontinence associated dermatitis Pressure Injury POA: N/A Measurement:Scattered partial thickness tissue loss, largest area is on right buttocks and measures 4cm x 2cm x 0.1cm. All areas are red, moist and present with a scant amount of serous exudate. Wound bed: Drainage (amount, consistency, odor)  Periwound: Satellite lesions surround the confluent red center consistent with fungal overgrowth. Dressing procedure/placement/frequency: Air mattress and topical care with zinc oxide: hydrocortisone: lotrimin cream (Gerhart's Butt Cream) instructions are provided for Nursing as well as guidance for turning and repositioning off of the supine position. Additionally, it is recommended that patient be provided with a system antifungal (eg., Diflucan).  If you agree, please order.  [place] nursing team will not follow, but will remain available to this patient, the nursing and medical teams.  Please re-consult if needed. Thanks, Maudie Flakes, MSN, RN, [place], Arther Abbott  Pager# [phone]

## 2017-04-21 NOTE — Progress Notes (Signed)
Patient transferred to mattress replacement bed.  Patient tolerated well.

## 2017-04-22 MED ORDER — ENSURE ENLIVE PO LIQD: 237.0000 mL | Freq: Two times a day (BID) | ORAL | Status: DC

## 2017-04-22 MED ORDER — BOOST / RESOURCE BREEZE PO LIQD CUSTOM
1.0000 | Freq: Three times a day (TID) | ORAL | Status: DC
  Administered 2017-04-22 – 2017-04-24 (×5): 1 via ORAL

## 2017-04-22 MED ORDER — TRAZODONE HCL 50 MG PO TABS
50.0000 mg | ORAL_TABLET | Freq: Every evening | ORAL | Status: DC | PRN
  Administered 2017-04-22 – 2017-04-26 (×5): 50 mg via ORAL
  Filled 2017-04-22 (×5): qty 1

## 2017-04-22 MED ORDER — ADULT MULTIVITAMIN W/MINERALS CH
1.0000 | ORAL_TABLET | Freq: Every day | ORAL | Status: DC
  Administered 2017-04-22 – 2017-04-25 (×4): 1 via ORAL
  Filled 2017-04-22 (×5): qty 1

## 2017-04-22 MED ORDER — GI COCKTAIL ~~LOC~~
30.0000 mL | Freq: Three times a day (TID) | ORAL | Status: DC | PRN
  Administered 2017-04-22: 30 mL via ORAL
  Filled 2017-04-22 (×2): qty 30

## 2017-04-22 NOTE — Progress Notes (Signed)
Initial Nutrition Assessment  DOCUMENTATION CODES:   Severe malnutrition in context of chronic illness  INTERVENTION:   -Provide Ensure Enlive po BID between meals, each supplement provides 350 kcal and 20 grams of protein -Continue Boost Breeze po TID with meals, each supplement provides 250 kcal and 9 grams of protein -Provide Multivitamin with minerals daily  RD will continue to monitor  NUTRITION DIAGNOSIS:   Severe Malnutrition related to chronic illness(chronic alcoholism, esophagitis, dysphagia) as evidenced by severe fat depletion, severe muscle depletion, percent weight loss.  GOAL:   Patient will meet greater than or equal to 90% of their needs  MONITOR:   PO intake, Supplement acceptance, Labs, Weight trends, I & O's, Skin  REASON FOR ASSESSMENT:   Consult Assessment of nutrition requirement/status  ASSESSMENT:   67 year old male with a past medical history significant for protein calorie malnutrition, tobacco abuse, alcohol abuse, hypertension and a recent admission secondary to esophagitis with upper GI bleed and aspiration pneumonia.  Who presented to the hospital secondary to generalized weakness and failure to thrive. Family unable to care after him.  Pt with variable intakes, did eat 100% of breakfast on 1/12. Pt drinking supplements provided [facility]). Will order Ensure supplements as well along with a daily MVI.  Poor intakes have continued at home, one meal a day. Pt reports not drinking ETOH since previous admission at [facility] in early January 2019.   Per chart review, pt has lost 17 lb since 11/25/16 (12% wt loss x  5 months, significant for time frame). Medications: Ferrous sulfate tablet TID, folic acid tablet daily, MAG-OX tablet daily, K-DUR tablet daily, Thiamine tablet daily, GI cocktail PRN Labs reviewed: Low Na, K, Mg   NUTRITION - FOCUSED PHYSICAL EXAM:    Most Recent Value  Orbital Region  Severe depletion  Upper Arm Region  Severe  depletion  Thoracic and Lumbar Region  Unable to assess  Buccal Region  Moderate depletion  Temple Region  Severe depletion  Clavicle Bone Region  Severe depletion  Clavicle and Acromion Bone Region  Severe depletion  Scapular Bone Region  Severe depletion  Dorsal Hand  Moderate depletion  Patellar Region  Moderate depletion  Anterior Thigh Region  Moderate depletion  Posterior Calf Region  Moderate depletion  Edema (RD Assessment)  None       Diet Order:  Diet regular Room service appropriate? Yes; Fluid consistency: Thin  EDUCATION NEEDS:   No education needs have been identified at this time  Skin:  Skin Integrity Issues:: Stage II Stage II: sacrum, buttocks  Last BM:  1/12  Height:   Ht Readings from Last 1 Encounters:  04/22/17 5\' 7"  (1.702 m)    Weight:   Wt Readings from Last 1 Encounters:  04/22/17 128 lb (58.1 kg)    Ideal Body Weight:  67.3 kg  BMI:  Body mass index is 20.05 kg/m.  Estimated Nutritional Needs:   Kcal:  1800-2000  Protein:  90-100g  Fluid:  1.8L/day   Clayton Bibles, MS, RD, LDN [place] [facility] Pager: [phone] After Hours Pager: [phone]

## 2017-04-22 NOTE — Progress Notes (Signed)
[facility] PROGRESS NOTE  Patrick [name] HDQ:[id number] DOB: 1950/05/30 DOA: 04/20/2017 PCP: [name], MD  Interim summary and history of present illness 67 year old male with a past medical history significant for protein calorie malnutrition, tobacco abuse, alcohol abuse, hypertension and a recent admission secondary to esophagitis with upper GI bleed and aspiration pneumonia.  Who presented to the hospital secondary to generalized weakness and failure to thrive. Family unable to care after him.  Assessment/Plan: 1-failure to thrive and deconditioning -Appears to be secondary to physical decline and further deconditioning. -patient with recent PNA and hospitalization; no acute findings on CXR and good O2 sat on ra currently. -will follow PT/OT assessment and recommendations -SW/CM on board to assist with safe discharge.  2-dehydration/hyponatremia/sinus tachycardia -continue IVF's resuscitation  -follow electrolyte and VS  3-recent esophagitis with GI bleed -No signs of overt bleeding -Continue PPI, dose adjusted for better control.  4-hypokalemia -Continue Daily supplementation with potassium and magnesium initiated. -Follow trend and further replete as needed.  5-HTN -soft BP appreciated, but has remained stable  -continue holding antihypertensive regimen for now.  -if needed will recommend adding low dose b-blocker for rebound tachycardia (he was supposedly taking this at home); but HR is well controlled currently w/o meds.  6-sacral decubitus ulcer: stage 2 -wound care consulted, appreciate rec's  -continue tx with diflucan for 7 days (fungal dermatitis) -Continue repositioning and physical measures for breakdown prevention (barrier creams and the use of air mattress).  7-chronic severe protein calorie malnutrition -Nutritional service has been consulted -Continue on boost 3 times a day  8-alcohol abuse antibiotic abuse -Cessation counseling has been  provided -Patient reports since last admission he had not been drinking; encouraged to keep himself alcohol free. -No history or signs of active withdrawal appreciated -continue Nicotine patch  Code Status: DNR Family Communication: No family at bedside Disposition Plan: Will keep in the hospital, continue IV fluids, continue feeding supplements, follow dietitian recommendation and have him evaluated by physical therapy and occupational therapy. PPI dose adjusted for better symptoms control.   Consultants: None  Procedures:  See below for x-ray reports.  Antibiotics:  None   HPI/Subjective: No fever, no CP, no SOB, no nausea, no vomiting. Patient some epigastric discomfort; reported feeling weak and deconditioned.  Objective: Vitals:   04/21/17 2048 04/22/17 0554  BP: 132/85 105/70  Pulse: (!) 107 99  Resp: 16 15  Temp: 98.4 F (36.9 C) 98.6 F (37 C)  SpO2: 100% 96%    Intake/Output Summary (Last 24 hours) at 04/22/2017 1242 Last data filed at 04/22/2017 0630 Gross per 24 hour  Intake 1142.5 ml  Output 951 ml  Net 191.5 ml   Filed Weights   04/22/17 1207  Weight: 58.1 kg (128 lb)    Exam:   General: no fever, no CP, no SOB. Reports having some epigastric discomfort this morning. Continue to feel weak and deconditioned.  Cardiovascular: S1 and S2, no rubs, no gallops, no JVD.  Respiratory: good air movement, no wheezing, no crackles, good air saturation on RA.   Abdomen: soft, mild epigastric pain reported on palpation, positive BS, no guarding.  Musculoskeletal: 1-2+ edema bilaterally, no cyanosis or clubbing.  Skin: unchanged skin lesions as described on my note 1/12:  stage II decubitus ulcer present since the moment of admission, with some surrounding redness and serosanguineous drainage (with concern for fungal dermatitis).  Data Reviewed: Basic Metabolic Panel: Recent Labs  Lab 04/16/17 0602 04/20/17 1845 04/21/17 0607  NA 138 135  130*  K 4.3  3.4* 3.4*  CL 108 107 104  CO2 25 23 22   GLUCOSE 74 86 90  BUN <5* 5* <5*  CREATININE 0.72 0.68 0.65  CALCIUM 8.3* 8.0* 7.6*  MG  --  1.3* 1.2*   Liver Function Tests: Recent Labs  Lab 04/20/17 1845 04/21/17 0607  AST 33 29  ALT 16* 15*  ALKPHOS 119 96  BILITOT 1.1 0.7  PROT 5.1* 4.5*  ALBUMIN 1.7* 1.5*   CBC: Recent Labs  Lab 04/16/17 0602 04/20/17 1845 04/21/17 0607  WBC 4.6 8.0 4.9  NEUTROABS  --  5.3  --   HGB 8.6* 9.0* 8.4*  HCT 27.1* 26.6* 24.8*  MCV 99.3 97.8 99.2  PLT 251 405* 345   CBG: No results for input(s): GLUCAP in the last 168 hours.  Recent Results (from the past 240 hour(s))  Culture, blood (routine x 2)     Status: None   Collection Time: 04/13/17 10:58 PM  Result Value Ref Range Status   Specimen Description BLOOD RIGHT HAND  Final   Special Requests IN PEDIATRIC BOTTLE Blood Culture adequate volume  Final   Culture NO GROWTH 5 DAYS  Final   Report Status 04/19/2017 FINAL  Final  Culture, blood (routine x 2)     Status: None   Collection Time: 04/13/17 11:02 PM  Result Value Ref Range Status   Specimen Description BLOOD LEFT HAND  Final   Special Requests IN PEDIATRIC BOTTLE Blood Culture adequate volume  Final   Culture NO GROWTH 5 DAYS  Final   Report Status 04/19/2017 FINAL  Final     Studies: Dg Chest Port 1 View  Result Date: 04/20/2017 CLINICAL DATA:  Weakness.  Shortness of breath.  Possible fever. EXAM: PORTABLE CHEST 1 VIEW COMPARISON:  04/13/2017 FINDINGS: Cardiomediastinal silhouette is normal. Mediastinal contours appear intact. Mild calcific atherosclerotic disease of the aorta. There is no evidence of focal airspace consolidation, pleural effusion or pneumothorax. Osseous structures are without acute abnormality. Soft tissues are grossly normal. IMPRESSION: No active disease. Electronically Signed   By: Fidela Salisbury M.D.   On: 04/20/2017 16:54    Scheduled Meds: . enoxaparin (LOVENOX) injection  40 mg Subcutaneous  Q24H  . feeding supplement  1 Container Oral TID WC  . feeding supplement (ENSURE ENLIVE)  237 mL Oral BID BM  . ferrous sulfate  325 mg Oral TID WC  . fluconazole  100 mg Oral Daily  . folic acid  1 mg Oral Daily  . Gerhardt's butt cream   Topical TID  . magnesium oxide  400 mg Oral Daily  . nicotine  14 mg Transdermal Daily  . pantoprazole  40 mg Oral BID  . potassium chloride  20 mEq Oral Daily  . thiamine  100 mg Oral Daily   Continuous Infusions: . sodium chloride 75 mL/hr at 04/21/17 1532    Time spent: 25 minutes   [facility] Pager [phone]. If 7PM-7AM, please contact night-coverage at [email or web address], password [facility] 04/22/2017, 12:42 PM  LOS: 2 days

## 2017-04-23 MED ORDER — ACETAMINOPHEN 325 MG PO TABS
650.0000 mg | ORAL_TABLET | Freq: Four times a day (QID) | ORAL | Status: DC | PRN
  Administered 2017-04-24: 650 mg via ORAL
  Filled 2017-04-23: qty 2

## 2017-04-23 MED ORDER — MORPHINE SULFATE (PF) 4 MG/ML IV SOLN
1.0000 mg | Freq: Once | INTRAVENOUS | Status: AC
  Administered 2017-04-23: 1 mg via INTRAVENOUS
  Filled 2017-04-23: qty 1

## 2017-04-23 NOTE — Evaluation (Signed)
Occupational Therapy Evaluation Patient Details Name: Patrick [name] MRN: [id number] DOB: May 17, 1950 Today's Date: 04/23/2017    History of Present Illness 67 year old male with a past medical history significant for protein calorie malnutrition, tobacco abuse, alcohol abuse, hypertension and a recent admission secondary to esophagitis with upper GI bleed and aspiration pneumonia.  Who presented to the hospital secondary to generalized weakness and failure to thrive. Family unable to care after him.   Clinical Impression   Pt admitted with FTT. Pt currently with functional limitations due to the deficits listed below (see OT Problem List).  Pt will benefit from skilled OT to increase their safety and independence with ADL and functional mobility for ADL to facilitate discharge to venue listed below.      Follow Up Recommendations  SNF;Supervision/Assistance - 24 hour(pt may need increased level of care as pt has been in hospital 5 times in 6 monthes)    Equipment Recommendations  None recommended by OT    Recommendations for Other Services       Precautions / Restrictions Precautions Precautions: Fall      Mobility Bed Mobility Overal bed mobility: Needs Assistance Bed Mobility: Supine to Sit;Sit to Supine     Supine to sit: Min assist Sit to supine: Min assist   General bed mobility comments: only sat briefly before throwing up and returning to supine  Transfers                 General transfer comment: NT as pt vomited        ADL either performed or assessed with clinical judgement   ADL Overall ADL's : Needs assistance/impaired Eating/Feeding: Set up;Sitting   Grooming: Set up;Sitting   Upper Body Bathing: Set up;Sitting   Lower Body Bathing: Moderate assistance;Bed level;Sitting/lateral leans   Upper Body Dressing : Set up;Sitting   Lower Body Dressing: Moderate assistance;Sitting/lateral leans;Bed level                 General ADL  Comments: did not stand as pt threw up upon sitting.  Pt stated he just took pills, RN notified.     Vision Patient Visual Report: No change from baseline              Pertinent Vitals/Pain Faces Pain Scale: Hurts even more Pain Location: upper belly Pain Descriptors / Indicators: Discomfort Pain Intervention(s): Limited activity within patient's tolerance;Repositioned     Hand Dominance     Extremity/Trunk Assessment Upper Extremity Assessment Upper Extremity Assessment: Overall WFL for tasks assessed           Communication Communication Communication: No difficulties   Cognition Arousal/Alertness: Awake/alert Behavior During Therapy: WFL for tasks assessed/performed Overall Cognitive Status: Within Functional Limits for tasks assessed                                                Home Living Family/patient expects to be discharged to:: Private residence Living Arrangements: Children Available Help at Discharge: Family Type of Home: House Home Access: Stairs to enter     Home Layout: One level     Bathroom Shower/Tub: (pt sponge bathing in downstairs sink)         Home Equipment: Walker - 2 wheels          Prior Functioning/Environment Level of Independence: Needs assistance  Gait / Transfers Assistance Needed:  pt does not get up much- has BSC      Comments: reports using RW sometimes, states he hasn't gone to second floor in home for a couple weeks as he cannot manage the stairs        OT Problem List: Decreased strength;Decreased activity tolerance;Decreased knowledge of use of DME or AE;Pain;Impaired balance (sitting and/or standing)      OT Treatment/Interventions: Self-care/ADL training;Patient/family education;DME and/or AE instruction    OT Goals(Current goals can be found in the care plan section) Acute Rehab OT Goals Patient Stated Goal: get well OT Goal Formulation: With patient Time For Goal Achievement:  05/07/17  OT Frequency: Min 2X/week   Barriers to D/C:               AM-PAC PT "6 Clicks" Daily Activity     Outcome Measure Help from another person eating meals?: None Help from another person taking care of personal grooming?: None Help from another person toileting, which includes using toliet, bedpan, or urinal?: A Lot Help from another person bathing (including washing, rinsing, drying)?: A Lot Help from another person to put on and taking off regular upper body clothing?: None Help from another person to put on and taking off regular lower body clothing?: A Lot 6 Click Score: 18   End of Session Nurse Communication: Other (comment)(notifed nurse of vomiting)  Activity Tolerance: Other (comment)(limited due to vomiting) Patient left: in bed;with call bell/phone within reach;with bed alarm set  OT Visit Diagnosis: Unsteadiness on feet (R26.81);History of falling (Z91.81);Other abnormalities of gait and mobility (R26.89);Pain                Time: 7017-7939 OT Time Calculation (min): 20 min Charges:  OT General Charges $OT Visit: 1 Visit OT Evaluation $OT Eval Moderate Complexity: 1 Mod G-Codes:     Kari Baars, [place]  [name] 04/23/2017, 10:52 AM

## 2017-04-23 NOTE — Evaluation (Signed)
Physical Therapy Evaluation Patient Details Name: Patrick [name] MRN: [id number] DOB: 22-Aug-1950 Today's Date: 04/23/2017   History of Present Illness  67 year old male with a past medical history significant for protein calorie malnutrition, tobacco abuse, alcohol abuse, hypertension and a recent admission secondary to esophagitis with upper GI bleed and aspiration pneumonia.  Who presented to the hospital secondary to generalized weakness and failure to thrive. Family unable to care after him.  Clinical Impression  Patient presents with decreased independence and safety with mobility due to general weakness, decreased balance, poor activity tolerance and will benefit from skilled PT in the acute setting to allow return home following SNF level rehab staty.    Follow Up Recommendations SNF    Equipment Recommendations  Wheelchair (measurements PT);Wheelchair cushion (measurements PT)    Recommendations for Other Services       Precautions / Restrictions Precautions Precautions: Fall      Mobility  Bed Mobility Overal bed mobility: Needs Assistance       Supine to sit: Supervision     General bed mobility comments: used bed rails  Transfers Overall transfer level: Needs assistance Equipment used: Rolling walker (2 wheeled) Transfers: Sit to/from [facility] Sit to Stand: Min assist         General transfer comment: cues for hand placement; cues for technique to turn to sit in recliner  Ambulation/Gait             General Gait Details: deferred   Stairs            Wheelchair Mobility    Modified Rankin (Stroke Patients Only)       Balance Overall balance assessment: Needs assistance Sitting-balance support: No upper extremity supported Sitting balance-Leahy Scale: Good     Standing balance support: During functional activity;Bilateral upper extremity supported Standing balance-Leahy Scale: Poor Standing balance comment: UE  support for balance                             Pertinent Vitals/Pain Faces Pain Scale: Hurts whole lot Pain Location: generalized, including buttocks Pain Descriptors / Indicators: Discomfort;Tender;Sore Pain Intervention(s): Limited activity within patient's tolerance;Monitored during session;Repositioned    Home Living Family/patient expects to be discharged to:: Private residence Living Arrangements: Children Available Help at Discharge: Family;Available PRN/intermittently Type of Home: House Home Access: Stairs to enter Entrance Stairs-Rails: Right Entrance Stairs-Number of Steps: 3 Home Layout: Two level;Bed/bath upstairs Home Equipment: Walker - 2 wheels;Bedside commode      Prior Function Level of Independence: Needs assistance   Gait / Transfers Assistance Needed: pt does not get up much- has BSC            Hand Dominance   Dominant Hand: Right    Extremity/Trunk Assessment   Upper Extremity Assessment Upper Extremity Assessment: Defer to OT evaluation    Lower Extremity Assessment Lower Extremity Assessment: RLE deficits/detail;LLE deficits/detail RLE Deficits / Details: AROM grossly WFL, strength hip flexion 3/5, knee extension 4-/5, ankle DF 4/5; has neuropathy  LLE Deficits / Details: AROM grossly WFL, strength hip flexion 3/5, knee extension 4-/5, ankle DF 4/5; has neuropathy     Cervical / Trunk Assessment Cervical / Trunk Assessment: Kyphotic  Communication   Communication: No difficulties  Cognition Arousal/Alertness: Awake/alert Behavior During Therapy: WFL for tasks assessed/performed Overall Cognitive Status: No family/caregiver present to determine baseline cognitive functioning  General Comments: seems to function WNL in hospital setting, making phone calls, etc, but problem solving at home just decides not to get up because "he can't, is weaker"      General Comments       Exercises     Assessment/Plan    PT Assessment Patient needs continued PT services  PT Problem List Decreased strength;Decreased knowledge of use of DME;Decreased activity tolerance;Decreased balance;Decreased knowledge of precautions;Decreased mobility       PT Treatment Interventions DME instruction;Gait training;Stair training;Functional mobility training;Therapeutic activities;Therapeutic exercise;Balance training;Neuromuscular re-education;Patient/family education    PT Goals (Current goals can be found in the Care Plan section)  Acute Rehab PT Goals Patient Stated Goal: to go to rehab PT Goal Formulation: With patient Time For Goal Achievement: 04/30/17 Potential to Achieve Goals: Fair    Frequency Min 3X/week   Barriers to discharge Decreased caregiver support son lives with pt, but not there 24/7    Co-evaluation               AM-PAC PT "6 Clicks" Daily Activity  Outcome Measure Difficulty turning over in bed (including adjusting bedclothes, sheets and blankets)?: None Difficulty moving from lying on back to sitting on the side of the bed? : A Little Difficulty sitting down on and standing up from a chair with arms (e.g., wheelchair, bedside commode, etc,.)?: Unable Help needed moving to and from a bed to chair (including a wheelchair)?: A Little Help needed walking in hospital room?: A Little Help needed climbing 3-5 steps with a railing? : A Lot 6 Click Score: 16    End of Session Equipment Utilized During Treatment: Gait belt Activity Tolerance: Patient limited by fatigue Patient left: in chair;with call bell/phone within reach;with chair alarm set   PT Visit Diagnosis: Other abnormalities of gait and mobility (R26.89);Adult, failure to thrive (R62.7);Muscle weakness (generalized) (M62.81)    Time: 1542-1600 PT Time Calculation (min) (ACUTE ONLY): 18 min   Charges:   PT Evaluation $PT Eval Moderate Complexity: 1 Mod     PT G CodesMagda [name], [place] [phone] 04/23/2017   Patrick [name] 04/23/2017, 4:45 PM

## 2017-04-23 NOTE — Progress Notes (Signed)
PROGRESS NOTE    Patrick [name]  NWG:[id number] DOB: Nov 08, 1950 DOA: 04/20/2017 PCP: [name], MD    Brief Narrative:  67 year old male who presented with generalized weakness. Patient does have significant past medical history for alcoholism, ambulatory dysfunction and sacral decubitus ulcer. On initial physical examination blood pressure 89/63, heart rate 110, respiratory 19, oxygen saturation 98%. Moist mucous membranes, lungs clear to auscultation bilaterally, heart S1-S2 present and rhythmic, and was soft nontender, no lower extremity edema. Sodium 129, potassium 2.1, chloride 83, bicarbonate 30, glucose 98, BUN 5, creatinine 0.66, magnesium 1.1, white count 7.4, hemoglobin 8.2, hematocrit 24.1, platelets 258. Urinalysis negative for infection. Chest x-ray was negative for infiltrates. EKG sinus tachycardia with significant interference.  Patient was admitted to the hospital working diagnosis of severe hypo-natremia, hypokalemia, contraction alkalosis and dehydration.   Assessment & Plan:   Active Problems:   TOBACCO USE   Hypertension   Alcohol dependence (HCC)   Hypokalemia   Erosive esophagitis   Pressure ulcer of sacral region, stage 2   Dysphagia   Upper GI bleed   FTT (failure to thrive) in adult   Weakness   Protein-calorie malnutrition, severe   Failure to thrive in adult   1. Generalized weakness. Patient very weak and deconditioned, social services consulted for discharge planing Will add acetaminophen as needed for pain, avoid narcotics.   2. Dehydration with hyponatremia and hypokalemia. Poor oral intake, will continue hydration with isotonic saline at 75 ml per hour. Will continue nutritional supplements. Will continue k supplements. Serum K 3,4 with serum cr at 0.65 and serum bicarbonate 22 on 1/12. Follow renal panel in am.   3. Esophagitis candida. Continue pantoprazole bid, po as tolerated. Patient declined GI cocktail. Will continue fluconazole.    4. Sacral decubitus ulcer stage 2. Local wound care and physical therapy evaluation.   5. Severe caloric protein malnutrition. Continue nutritional supplements, vitamins and iron supplements.   6. Alcohol abuse. No signs of alcohol withdrawal syndrome, will continue neuro checks per unit protocol. Continue thiamine.   DVT prophylaxis: enoxaparin   Code Status:  dnr Family Communication: no family at the bedside Disposition Plan: home   Consultants:     Procedures:     Antimicrobials:       Subjective: Patient deconditioned, no nausea or vomiting, positive weakness and poor appetite. Positive generalized pain. No dyspnea.   Objective: Vitals:   04/22/17 1207 04/22/17 1416 04/22/17 2035 04/23/17 0530  BP:  117/76 125/84 109/71  Pulse:  95 (!) 101 (!) 101  Resp:  18 15 14   Temp:  99 F (37.2 C) 98.7 F (37.1 C) 98.8 F (37.1 C)  TempSrc:  Oral Oral Oral  SpO2:  99% 99% 99%  Weight: 58.1 kg (128 lb)     Height: 5\' 7"  (1.702 m)       Intake/Output Summary (Last 24 hours) at 04/23/2017 1246 Last data filed at 04/23/2017 0557 Gross per 24 hour  Intake 1586.25 ml  Output 575 ml  Net 1011.25 ml   Filed Weights   04/22/17 1207  Weight: 58.1 kg (128 lb)    Examination:   General: deconditioned and ill looking appearing Neurology: Awake and alert, non focal  E ENT: mild pallor, no icterus, oral mucosa moist Cardiovascular: No JVD. S1-S2 present, rhythmic, no gallops, rubs, or murmurs. Trace dependent lower extremity edema. Pulmonary: vesicular breath sounds bilaterally, adequate air movement, no wheezing, rhonchi or rales. Mild decreased breath sounds at bases.  Gastrointestinal. Abdomen  flat, no organomegaly, non tender, no rebound or guarding Skin. No rashes Musculoskeletal: no joint deformities     Data Reviewed: I have personally reviewed following labs and imaging studies  CBC: Recent Labs  Lab 04/20/17 1845 04/21/17 0607  WBC 8.0 4.9   NEUTROABS 5.3  --   HGB 9.0* 8.4*  HCT 26.6* 24.8*  MCV 97.8 99.2  PLT 405* 974   Basic Metabolic Panel: Recent Labs  Lab 04/20/17 1845 04/21/17 0607  NA 135 130*  K 3.4* 3.4*  CL 107 104  CO2 23 22  GLUCOSE 86 90  BUN 5* <5*  CREATININE 0.68 0.65  CALCIUM 8.0* 7.6*  MG 1.3* 1.2*   GFR: Estimated Creatinine Clearance: 76.7 mL/min (by C-G formula based on SCr of 0.65 mg/dL). Liver Function Tests: Recent Labs  Lab 04/20/17 1845 04/21/17 0607  AST 33 29  ALT 16* 15*  ALKPHOS 119 96  BILITOT 1.1 0.7  PROT 5.1* 4.5*  ALBUMIN 1.7* 1.5*   No results for input(s): LIPASE, AMYLASE in the last 168 hours. No results for input(s): AMMONIA in the last 168 hours. Coagulation Profile: No results for input(s): INR, PROTIME in the last 168 hours. Cardiac Enzymes: No results for input(s): CKTOTAL, CKMB, CKMBINDEX, TROPONINI in the last 168 hours. BNP (last 3 results) No results for input(s): PROBNP in the last 8760 hours. HbA1C: No results for input(s): HGBA1C in the last 72 hours. CBG: No results for input(s): GLUCAP in the last 168 hours. Lipid Profile: No results for input(s): CHOL, HDL, LDLCALC, TRIG, CHOLHDL, LDLDIRECT in the last 72 hours. Thyroid Function Tests: Recent Labs    04/20/17 2205  TSH 2.983   Anemia Panel: No results for input(s): VITAMINB12, FOLATE, FERRITIN, TIBC, IRON, RETICCTPCT in the last 72 hours.    Radiology Studies: I have reviewed all of the imaging during this hospital visit personally     Scheduled Meds: . enoxaparin (LOVENOX) injection  40 mg Subcutaneous Q24H  . feeding supplement  1 Container Oral TID WC  . feeding supplement (ENSURE ENLIVE)  237 mL Oral BID BM  . ferrous sulfate  325 mg Oral TID WC  . fluconazole  100 mg Oral Daily  . folic acid  1 mg Oral Daily  . Gerhardt's butt cream   Topical TID  . magnesium oxide  400 mg Oral Daily  . multivitamin with minerals  1 tablet Oral Daily  . nicotine  14 mg Transdermal  Daily  . pantoprazole  40 mg Oral BID  . potassium chloride  20 mEq Oral Daily  . thiamine  100 mg Oral Daily   Continuous Infusions: . sodium chloride 75 mL/hr at 04/23/17 0955     LOS: 3 days        Mekel Haverstock Gerome Apley, MD [facility] Pager [phone]

## 2017-04-23 NOTE — Progress Notes (Signed)
Date:  April 23, 2017 Chart reviewed for concurrent status and case management needs.  Will continue to follow patient progress. Failure to thrive. Discharge Planning: following for needs. Family unable to care further for the patient will follow for palcement needs. None present at this time of review. Expected discharge date: January 172019 Rhonda Davis, BSN, [place], [place]

## 2017-04-24 LAB — BASIC METABOLIC PANEL
ANION GAP: 6 (ref 5–15)
BUN: <5 mg/dL — ABNORMAL LOW (ref 6–20)
CALCIUM: 8 mg/dL — AB (ref 8.9–10.3)
CO2: 22 mmol/L (ref 22–32)
CREATININE: 0.67 mg/dL (ref 0.61–1.24)
Chloride: 107 mmol/L (ref 101–111)
Glucose, Bld: 79 mg/dL (ref 65–99)
Potassium: 3.6 mmol/L (ref 3.5–5.1)
SODIUM: 135 mmol/L (ref 135–145)

## 2017-04-24 MED ORDER — POTASSIUM CHLORIDE CRYS ER 20 MEQ PO TBCR
40.0000 meq | EXTENDED_RELEASE_TABLET | Freq: Once | ORAL | Status: AC
  Administered 2017-04-24: 40 meq via ORAL

## 2017-04-24 MED ORDER — SUCRALFATE 1 GM/10ML PO SUSP
1.0000 g | Freq: Three times a day (TID) | ORAL | Status: DC
  Administered 2017-04-24 – 2017-04-27 (×14): 1 g via ORAL
  Filled 2017-04-24 (×14): qty 10

## 2017-04-24 NOTE — Progress Notes (Signed)
PROGRESS NOTE    Patrick [name]  SHF:[id number] DOB: [birth year] DOA: 04/20/2017 PCP: [name], MD    Brief Narrative:  67 year old male who presented with generalized weakness. Patient does have significant past medical history for alcoholism, ambulatory dysfunction and sacral decubitus ulcer. On initial physical examination blood pressure 89/63, heart rate 110, respiratory 19, oxygen saturation 98%. Moist mucous membranes, lungs clear to auscultation bilaterally, heart S1-S2 present and rhythmic, and was soft nontender, no lower extremity edema. Sodium 129, potassium 2.1, chloride 83, bicarbonate 30, glucose 98, BUN 5, creatinine 0.66, magnesium 1.1, white count 7.4, hemoglobin 8.2, hematocrit 24.1, platelets 258. Urinalysis negative for infection. Chest x-ray was negative for infiltrates. EKG sinus tachycardia with significant interference.  Patient was admitted to the hospital working diagnosis of severe hypo-natremia, hypokalemia, contraction alkalosis and dehydration.   Assessment & Plan:   Active Problems:   TOBACCO USE   Hypertension   Alcohol dependence (HCC)   Hypokalemia   Erosive esophagitis   Pressure ulcer of sacral region, stage 2   Dysphagia   Upper GI bleed   FTT (failure to thrive) in adult   Weakness   Protein-calorie malnutrition, severe   Failure to thrive in adult   1. Generalized weakness. Patient continue to be very weak and deconditioned. Plan for SNF for rehab.   2. Dehydration with hyponatremia and hypokalemia.Hydration with isotonic saline at 75 ml per hour, plus oral nutritional supplements. Serum K 3,6 with serum cr at 0.67 and serum bicarbonate 22. Continue K repletion.  3. Esophagitis candida. This am with severe reflux symptoms, causing emesis, will continue with pantoprazole bid, will add sucralfate and instructed to keep seated during and after meals.  4. Sacral decubitus ulcer stage 2. Continue local wound care, out of bed to chair  as tolerated, physical therapy evaluation.   5. Severe caloric protein malnutrition. Nutritional supplements, plus vitamins and iron supplements.   6. Alcohol abuse. Currently with no signs of alcohol withdrawal, continue thiamine.   DVT prophylaxis: enoxaparin   Code Status:  dnr Family Communication: no family at the bedside Disposition Plan: home   Consultants:     Procedures:     Antimicrobials:      Subjective: Patient with positive postprandial vomiting, associated with reflux. Still very weak and deconditioned, no chest pain or dyspnea.    Objective: Vitals:   04/23/17 0530 04/23/17 1441 04/23/17 2225 04/24/17 0614  BP: 109/71 137/88 113/72 121/86  Pulse: (!) 101 98 (!) 105 92  Resp: 14 14 16 16   Temp: 98.8 F (37.1 C) 98.4 F (36.9 C) 98.4 F (36.9 C) 98 F (36.7 C)  TempSrc: Oral Oral Oral   SpO2: 99% 100% 97% 98%  Weight:      Height:        Intake/Output Summary (Last 24 hours) at 04/24/2017 0910 Last data filed at 04/24/2017 0600 Gross per 24 hour  Intake 1575 ml  Output 350 ml  Net 1225 ml   Filed Weights   04/22/17 1207  Weight: 58.1 kg (128 lb)    Examination:   General: Not in pain or dyspnea, deconditioned Neurology: Awake and alert, non focal  E ENT: no pallor, no icterus, oral mucosa moist Cardiovascular: No JVD. S1-S2 present, rhythmic, no gallops, rubs, or murmurs. No lower extremity edema. Pulmonary: vesicular breath sounds bilaterally, adequate air movement, no wheezing, rhonchi or rales. Mild decreased breath sounds at bases.  Gastrointestinal. Abdomen flat, no organomegaly, non tender, no rebound or guarding Skin. No  rashes Musculoskeletal: no joint deformities     Data Reviewed: I have personally reviewed following labs and imaging studies  CBC: Recent Labs  Lab 04/20/17 1845 04/21/17 0607  WBC 8.0 4.9  NEUTROABS 5.3  --   HGB 9.0* 8.4*  HCT 26.6* 24.8*  MCV 97.8 99.2  PLT 405* 732   Basic Metabolic  Panel: Recent Labs  Lab 04/20/17 1845 04/21/17 0607 04/24/17 0702  NA 135 130* 135  K 3.4* 3.4* 3.6  CL 107 104 107  CO2 23 22 22   GLUCOSE 86 90 79  BUN 5* <5* <5*  CREATININE 0.68 0.65 0.67  CALCIUM 8.0* 7.6* 8.0*  MG 1.3* 1.2*  --    GFR: Estimated Creatinine Clearance: 76.7 mL/min (by C-G formula based on SCr of 0.67 mg/dL). Liver Function Tests: Recent Labs  Lab 04/20/17 1845 04/21/17 0607  AST 33 29  ALT 16* 15*  ALKPHOS 119 96  BILITOT 1.1 0.7  PROT 5.1* 4.5*  ALBUMIN 1.7* 1.5*   No results for input(s): LIPASE, AMYLASE in the last 168 hours. No results for input(s): AMMONIA in the last 168 hours. Coagulation Profile: No results for input(s): INR, PROTIME in the last 168 hours. Cardiac Enzymes: No results for input(s): CKTOTAL, CKMB, CKMBINDEX, TROPONINI in the last 168 hours. BNP (last 3 results) No results for input(s): PROBNP in the last 8760 hours. HbA1C: No results for input(s): HGBA1C in the last 72 hours. CBG: No results for input(s): GLUCAP in the last 168 hours. Lipid Profile: No results for input(s): CHOL, HDL, LDLCALC, TRIG, CHOLHDL, LDLDIRECT in the last 72 hours. Thyroid Function Tests: No results for input(s): TSH, T4TOTAL, FREET4, T3FREE, THYROIDAB in the last 72 hours. Anemia Panel: No results for input(s): VITAMINB12, FOLATE, FERRITIN, TIBC, IRON, RETICCTPCT in the last 72 hours.    Radiology Studies: I have reviewed all of the imaging during this hospital visit personally     Scheduled Meds: . enoxaparin (LOVENOX) injection  40 mg Subcutaneous Q24H  . feeding supplement  1 Container Oral TID WC  . feeding supplement (ENSURE ENLIVE)  237 mL Oral BID BM  . ferrous sulfate  325 mg Oral TID WC  . fluconazole  100 mg Oral Daily  . folic acid  1 mg Oral Daily  . Gerhardt's butt cream   Topical TID  . magnesium oxide  400 mg Oral Daily  . multivitamin with minerals  1 tablet Oral Daily  . nicotine  14 mg Transdermal Daily  .  pantoprazole  40 mg Oral BID  . potassium chloride  20 mEq Oral Daily  . thiamine  100 mg Oral Daily   Continuous Infusions: . sodium chloride 75 mL/hr at 04/23/17 2318     LOS: 4 days        Mauricio Gerome Apley, MD [facility] Pager [phone]

## 2017-04-25 LAB — BASIC METABOLIC PANEL
ANION GAP: 9 (ref 5–15)
BUN: <5 mg/dL — ABNORMAL LOW (ref 6–20)
CHLORIDE: 105 mmol/L (ref 101–111)
CO2: 19 mmol/L — AB (ref 22–32)
Calcium: 8.1 mg/dL — ABNORMAL LOW (ref 8.9–10.3)
Creatinine, Ser: 0.76 mg/dL (ref 0.61–1.24)
GFR calc Af Amer: >60 mL/min (ref >60)
Glucose, Bld: 60 mg/dL — ABNORMAL LOW (ref 65–99)
POTASSIUM: 3.7 mmol/L (ref 3.5–5.1)
Sodium: 133 mmol/L — ABNORMAL LOW (ref 135–145)

## 2017-04-25 LAB — MAGNESIUM: Magnesium: 1.5 mg/dL — ABNORMAL LOW (ref 1.7–2.4)

## 2017-04-25 NOTE — Progress Notes (Signed)
PROGRESS NOTE  Patrick [name]  QIO:[id number] DOB: March 29, 1951 DOA: 04/20/2017   PCP: [name], MD   Brief Narrative:  Pt is 67 yo male with known history of alcohol use and ambulatory dysfunction, sacral decubitus ulcer, presented with generalized weakness.  Patient was admitted with working diagnosis of severe hyponatremia, hypokalemia and contraction alkalosis.   Assessment & Plan:   Generalized weakness - persistent weakness, plan for SNF placement   Dehydration with hyponatremia and hypokalemia - with also low Mg - will need repeat blood work to check on status - Mg was low but no follow up blood work, will check today   Esophagitis candida  - This am with severe reflux symptoms, causing emesis - continue PPI, sucralfate - ensure sitting during meals   Sacral decubitus ulcer stage 2 - continue local wound care   Severe caloric protein malnutrition - continue nutritional supplements   Alcohol abuse - no signs of alcohol withdrawal   DVT prophylaxis: enoxaparin  Code Status:  DNR Family Communication: pt at bedside  Disposition Plan: wil go to SNF when bed available   Consultants:   None  Procedures:   None  Antimicrobials:    None  Subjective: Pt denies any specific concerns this AM.   Objective: Vitals:   04/24/17 1435 04/24/17 2118 04/25/17 0558 04/25/17 1337  BP: (!) 139/95 129/89 124/82 (!) 141/90  Pulse: 96 94 97 97  Resp: 16 18 18 16   Temp: 98.1 F (36.7 C) 98.5 F (36.9 C) 97.6 F (36.4 C) 98.5 F (36.9 C)  TempSrc: Oral Oral Oral Oral  SpO2: 100% 99% 100% 100%  Weight:      Height:        Intake/Output Summary (Last 24 hours) at 04/25/2017 1446 Last data filed at 04/24/2017 1600 Gross per 24 hour  Intake 591.67 ml  Output -  Net 591.67 ml   Filed Weights   04/22/17 1207  Weight: 58.1 kg (128 lb)    Physical Exam  Constitutional: Appears calm, NAD CVS: RRR, S1/S2 +, no murmurs, no gallops, no carotid  bruit.  Pulmonary: Effort and breath sounds normal, no stridor, rhonchi, wheezes, rales.  Abdominal: Soft. BS +,  no distension, tenderness, rebound or guarding.   Data Reviewed: I have personally reviewed following labs and imaging studies  CBC: Recent Labs  Lab 04/20/17 1845 04/21/17 0607  WBC 8.0 4.9  NEUTROABS 5.3  --   HGB 9.0* 8.4*  HCT 26.6* 24.8*  MCV 97.8 99.2  PLT 405* 324   Basic Metabolic Panel: Recent Labs  Lab 04/20/17 1845 04/21/17 0607 04/24/17 0702  NA 135 130* 135  K 3.4* 3.4* 3.6  CL 107 104 107  CO2 23 22 22   GLUCOSE 86 90 79  BUN 5* <5* <5*  CREATININE 0.68 0.65 0.67  CALCIUM 8.0* 7.6* 8.0*  MG 1.3* 1.2*  --    Liver Function Tests: Recent Labs  Lab 04/20/17 1845 04/21/17 0607  AST 33 29  ALT 16* 15*  ALKPHOS 119 96  BILITOT 1.1 0.7  PROT 5.1* 4.5*  ALBUMIN 1.7* 1.5*   Radiology Studies: I have reviewed blood work, VS, imaging studies personally  Scheduled Meds: . enoxaparin (LOVENOX) injection  40 mg Subcutaneous Q24H  . feeding supplement  1 Container Oral TID WC  . feeding supplement (ENSURE ENLIVE)  237 mL Oral BID BM  . ferrous sulfate  325 mg Oral TID WC  . fluconazole  100 mg Oral Daily  . folic  acid  1 mg Oral Daily  . Gerhardt's butt cream   Topical TID  . magnesium oxide  400 mg Oral Daily  . multivitamin with minerals  1 tablet Oral Daily  . nicotine  14 mg Transdermal Daily  . pantoprazole  40 mg Oral BID  . sucralfate  1 g Oral TID WC & HS  . thiamine  100 mg Oral Daily   Continuous Infusions: . sodium chloride 50 mL/hr at 04/24/17 0940     LOS: 5 days   Time spent 25 minutes   Faye Ramsay, MD [facility] Pager [phone]

## 2017-04-25 NOTE — Clinical Social Work Note (Addendum)
Clinical Social Work Assessment  Patient Details  Name: Patrick [name] MRN: [id number] Date of Birth: [birth year]  Date of referral:  04/25/17               Reason for consult:  Facility Placement                Permission sought to share information with:  Permission granted to share information::    Name::       Agency::     Relationship::    Contact Information:     Housing/Transportation Living arrangements for the past 2 months:  Single Family Home Source of Information:  Patient Patient Interpreter Needed:  None Criminal Activity/Legal Involvement Pertinent to Current Situation/Hospitalization:  No - Comment as needed Significant Relationships:  Adult Children Lives with:    Do you feel safe going back to the place where you live?  Yes Need for family participation in patient care:  Yes (Comment)  Care giving concerns:  Patient reports it is difficult to ambulate even with a walker or cane. Patient does not have insurance. Patient previously admitted at [facility] and is awaiting paperwork to complete medicaid packet.    Social Worker assessment / plan:  LCSW consulted for SNF placement.   Patient admitted for weakness.  LCSW met with patient at bedside. No family present.  Patient was admitted to [facility] in October 2018 and seen by CSW. At that time patient was living alone. An APS report was made and patient refused SNF.   Patient reports that he is living with his son.   Patient stated that he had home health and the home health nurse and supervisor sent him to the hospital this admission.   Patient has no payor source. LCSW asked patient if he would be agreeable to SNF if resources were available. Patient declined SNF and stated he prefers to go home and rely on his son.   Patient reports that his son is able to assist him.   Patient also stated he was no longer interested in home health.   Patient reports it has been difficult for him to get around for the past year. Prior  to that patient reports using a walker to ambulate. Presently he states he can barely walk at all even with assistance.   Patient did not give LCSW permission to speak with family.   PLAN: Patient will go home at DC.   Employment status:  Disabled (Comment on whether or not currently receiving Disability) Insurance information:  Self Pay (Medicaid Pending) PT Recommendations:  [place] / Referral to community resources:     Patient/Family's Response to care:  Patient appeared to be frustrated with his current situation. Patient is not willing to utilize resources to assist in his treatment.   Patient/Family's Understanding of and Emotional Response to Diagnosis, Current Treatment, and Prognosis:  Patient is understanding of his current diagnosis. Patient is not agreeable to SNF placement and has no payor source. Patient is not interested in home health services.   Emotional Assessment Appearance:  Appears older than stated age Attitude/Demeanor/Rapport:    Affect (typically observed):  Calm, Guarded Orientation:  Oriented to Self, Oriented to Place, Oriented to  Time, Oriented to Situation Alcohol / Substance use:  Not Applicable Psych involvement (Current and /or in the community):  No (Comment)  Discharge Needs  Concerns to be addressed:  Financial / Insurance Concerns Readmission within the last 30 days:  Yes Current discharge risk:  Physical Impairment Barriers to Discharge:  Inadequate or no insurance, No SNF bed   Servando Snare, LCSW 04/25/2017, 10:00 AM

## 2017-04-25 NOTE — Progress Notes (Signed)
Physical Therapy Treatment Patient Details Name: Patrick [name] MRN: [id number] DOB: Apr 11, 1950 Today's Date: 04/25/2017    History of Present Illness 67 year old male with a past medical history significant for protein calorie malnutrition, tobacco abuse, alcohol abuse, hypertension and a recent admission secondary to esophagitis with upper GI bleed and aspiration pneumonia.  Who presented to the hospital secondary to generalized weakness and failure to thrive. Family unable to care after him.    PT Comments    Patient progressing with ambulation in hallway this session, but self limited with distance.  LE weakness evident, but participative in therex for strengthening.  Continues to be appropriate for SNF level rehab.   Follow Up Recommendations  SNF     Equipment Recommendations  Wheelchair (measurements PT);Wheelchair cushion (measurements PT)    Recommendations for Other Services       Precautions / Restrictions Precautions Precautions: Fall    Mobility  Bed Mobility Overal bed mobility: Needs Assistance Bed Mobility: Sit to Supine     Supine to sit: Supervision Sit to supine: Supervision   General bed mobility comments: cues for positioning  Transfers Overall transfer level: Needs assistance Equipment used: Rolling walker (2 wheeled) Transfers: Sit to/from Stand Sit to Stand: Min guard Stand pivot transfers: Min assist       General transfer comment: assist to steady   Ambulation/Gait Ambulation/Gait assistance: Min assist Ambulation Distance (Feet): 40 Feet Assistive device: Rolling walker (2 wheeled) Gait Pattern/deviations: Step-through pattern;Decreased stride length     General Gait Details: self limited, reports cannot walk that far   Stairs            Wheelchair Mobility    Modified Rankin (Stroke Patients Only)       Balance Overall balance assessment: Needs assistance Sitting-balance support: Feet supported Sitting  balance-Leahy Scale: Good     Standing balance support: During functional activity;Bilateral upper extremity supported Standing balance-Leahy Scale: Poor Standing balance comment: reliant on bil. UE support                             Cognition Arousal/Alertness: Awake/alert Behavior During Therapy: WFL for tasks assessed/performed Overall Cognitive Status: No family/caregiver present to determine baseline cognitive functioning Area of Impairment: Following commands;Safety/judgement;Problem solving                       Following Commands: Follows one step commands with increased time Safety/Judgement: Decreased awareness of safety   Problem Solving: Slow processing General Comments: Pt with decreased awareness and decreased judgement       Exercises General Exercises - Lower Extremity Hip Flexion/Marching: Strengthening;Both;Supine;5 reps Low Level/ICU Exercises Stabilized Bridging: Strengthening;Both;Supine;5 reps    General Comments General comments (skin integrity, edema, etc.): Pt reports he is very sedentary at home.  He sits on couch and transfers to [facility].  Performs sponge bath seated on couch.  Explained benefits of increased activity at home, and encouraged him to do more to increase endurance and strength       Pertinent Vitals/Pain Pain Assessment: Faces Faces Pain Scale: Hurts little more Pain Location: buttocks Pain Descriptors / Indicators: Sore Pain Intervention(s): Limited activity within patient's tolerance;Monitored during session    Home Living                      Prior Function            PT Goals (current goals can now  be found in the care plan section) Progress towards PT goals: Progressing toward goals    Frequency    Min 3X/week      PT Plan Current plan remains appropriate    Co-evaluation              AM-PAC PT "6 Clicks" Daily Activity  Outcome Measure  Difficulty turning over in bed  (including adjusting bedclothes, sheets and blankets)?: None Difficulty moving from lying on back to sitting on the side of the bed? : A Little Difficulty sitting down on and standing up from a chair with arms (e.g., wheelchair, bedside commode, etc,.)?: Unable Help needed moving to and from a bed to chair (including a wheelchair)?: A Little Help needed walking in hospital room?: A Little Help needed climbing 3-5 steps with a railing? : A Lot 6 Click Score: 16    End of Session Equipment Utilized During Treatment: Gait belt Activity Tolerance: Patient limited by fatigue Patient left: in bed;with call bell/phone within reach;with bed alarm set   PT Visit Diagnosis: Other abnormalities of gait and mobility (R26.89);Adult, failure to thrive (R62.7);Muscle weakness (generalized) (M62.81)     Time: 1025-8527 PT Time Calculation (min) (ACUTE ONLY): 16 min  Charges:  $Gait Training: 8-22 mins                    G CodesMagda Kiel, [place] [phone] 04/25/2017    [name] 04/25/2017, 4:07 PM

## 2017-04-25 NOTE — Progress Notes (Signed)
LCSW following for SNF placement.   After further investigation it is noted that patient was previously found not to have capacity and sister, Vinnie Level is patients HCPOA.   LCSW confirmed HCPOA with patient and got patients permission to contact son and sister.   Patient does not have payor source, but will staff case with CSW Surveyor, quantity.   LCSW will continue to follow.   Carolin Coy [place]

## 2017-04-25 NOTE — Progress Notes (Signed)
Occupational Therapy Treatment Patient Details Name: Patrick [name] MRN: [id number] DOB: 1950/11/16 Today's Date: 04/25/2017    History of present illness 67 year old male with a past medical history significant for protein calorie malnutrition, tobacco abuse, alcohol abuse, hypertension and a recent admission secondary to esophagitis with upper GI bleed and aspiration pneumonia.  Who presented to the hospital secondary to generalized weakness and failure to thrive. Family unable to care after him.   OT comments  Pt with limited participation, but did agree to transfer to recliner.  He requires min A for LB ADLs and min A for functional transfers due to impaired balance.  He would benefit from SNF level rehab to allow him to maximize safety and independence with ADLs.   Follow Up Recommendations  SNF;Supervision/Assistance - 24 hour    Equipment Recommendations  None recommended by OT    Recommendations for Other Services      Precautions / Restrictions Precautions Precautions: Fall       Mobility Bed Mobility Overal bed mobility: Needs Assistance Bed Mobility: Supine to Sit     Supine to sit: Supervision        Transfers Overall transfer level: Needs assistance Equipment used: Rolling walker (2 wheeled) Transfers: Sit to/from [facility] Sit to Stand: Min assist Stand pivot transfers: Min assist       General transfer comment: assist to steady     Balance Overall balance assessment: Needs assistance Sitting-balance support: No upper extremity supported Sitting balance-Leahy Scale: Good     Standing balance support: During functional activity;Bilateral upper extremity supported Standing balance-Leahy Scale: Poor Standing balance comment: reliant on bil. UE support                            ADL either performed or assessed with clinical judgement   ADL Overall ADL's : Needs assistance/impaired             Lower Body Bathing:  Minimal assistance;Sit to/from stand       Lower Body Dressing: Minimal assistance;Sit to/from stand   Toilet Transfer: Minimal assistance;Stand-pivot;BSC;RW   Toileting- Clothing Manipulation and Hygiene: Minimal assistance;Sit to/from stand       Functional mobility during ADLs: Minimal assistance General ADL Comments: min A for balance.  He was able to cross ankles over knees to access bil. feet for LB ADLs      Vision       Perception     Praxis      Cognition Arousal/Alertness: Awake/alert Behavior During Therapy: WFL for tasks assessed/performed Overall Cognitive Status: No family/caregiver present to determine baseline cognitive functioning                                 General Comments: Pt with decreased awareness and decreased judgement         Exercises     Shoulder Instructions       General Comments Pt reports he is very sedentary at home.  He sits on couch and transfers to [facility]  Performs sponge bath seated on couch.  Explained benefits of increased activity at home, and encouraged him to do more to increase endurance and strength     Pertinent Vitals/ Pain       Pain Assessment: Faces Faces Pain Scale: Hurts little more Pain Location: generalized  Pain Descriptors / Indicators: Grimacing Pain Intervention(s): Monitored during session  Home  Living                                          Prior Functioning/Environment              Frequency  Min 2X/week        Progress Toward Goals  OT Goals(current goals can now be found in the care plan section)  Progress towards OT goals: Progressing toward goals     Plan Discharge plan remains appropriate    Co-evaluation                 AM-PAC PT "6 Clicks" Daily Activity     Outcome Measure   Help from another person eating meals?: None Help from another person taking care of personal grooming?: A Little Help from another person toileting, which  includes using toliet, bedpan, or urinal?: A Little Help from another person bathing (including washing, rinsing, drying)?: A Little Help from another person to put on and taking off regular upper body clothing?: A Little Help from another person to put on and taking off regular lower body clothing?: A Little 6 Click Score: 19    End of Session Equipment Utilized During Treatment: Rolling walker  OT Visit Diagnosis: Unsteadiness on feet (R26.81);History of falling (Z91.81);Other abnormalities of gait and mobility (R26.89);Pain   Activity Tolerance Patient limited by fatigue   Patient Left in chair;with call bell/phone within reach;with chair alarm set;with nursing/sitter in room   Nurse Communication Mobility status        Time: 1345-1400 OT Time Calculation (min): 15 min  Charges: OT Treatments $Therapeutic Activity: 8-22 mins  [facility], OTR/L [phone]    Lucille Passy M 04/25/2017, 2:11 PM

## 2017-04-26 LAB — BASIC METABOLIC PANEL
ANION GAP: 5 (ref 5–15)
CALCIUM: 8.2 mg/dL — AB (ref 8.9–10.3)
CO2: 24 mmol/L (ref 22–32)
Chloride: 108 mmol/L (ref 101–111)
Creatinine, Ser: 0.68 mg/dL (ref 0.61–1.24)
GLUCOSE: 76 mg/dL (ref 65–99)
POTASSIUM: 3.4 mmol/L — AB (ref 3.5–5.1)
Sodium: 137 mmol/L (ref 135–145)

## 2017-04-26 LAB — CBC
HCT: 25.3 % — ABNORMAL LOW (ref 39.0–52.0)
HEMOGLOBIN: 8.5 g/dL — AB (ref 13.0–17.0)
MCH: 32.9 pg (ref 26.0–34.0)
MCHC: 33.6 g/dL (ref 30.0–36.0)
MCV: 98.1 fL (ref 78.0–100.0)
Platelets: 318 10*3/uL (ref 150–400)
RBC: 2.58 MIL/uL — ABNORMAL LOW (ref 4.22–5.81)
RDW: 15.9 % — ABNORMAL HIGH (ref 11.5–15.5)
WBC: 5.2 10*3/uL (ref 4.0–10.5)

## 2017-04-26 LAB — MAGNESIUM: MAGNESIUM: 1.4 mg/dL — AB (ref 1.7–2.4)

## 2017-04-26 MED ORDER — ACETAMINOPHEN 325 MG PO TABS: 650.0000 mg | ORAL_TABLET | Freq: Four times a day (QID) | ORAL | Status: DC | PRN

## 2017-04-26 MED ORDER — MAGNESIUM SULFATE 2 GM/50ML IV SOLN
2.0000 g | Freq: Once | INTRAVENOUS | Status: AC
  Administered 2017-04-26: 2 g via INTRAVENOUS
  Filled 2017-04-26: qty 50

## 2017-04-26 MED ORDER — POTASSIUM CHLORIDE CRYS ER 20 MEQ PO TBCR
40.0000 meq | EXTENDED_RELEASE_TABLET | Freq: Once | ORAL | Status: AC
  Administered 2017-04-26: 40 meq via ORAL
  Filled 2017-04-26: qty 2

## 2017-04-26 MED ORDER — HYDROCODONE-ACETAMINOPHEN 5-325 MG PO TABS
1.0000 | ORAL_TABLET | ORAL | Status: DC | PRN
  Administered 2017-04-26 – 2017-04-27 (×4): 1 via ORAL
  Filled 2017-04-26 (×4): qty 1

## 2017-04-26 NOTE — Progress Notes (Signed)
PROGRESS NOTE  LEDON WEIHE  NID:[id number] DOB: 04/12/1950 DOA: 04/20/2017   PCP: [name], MD   Brief Narrative:  Pt is 67 yo male with known history of alcohol use and ambulatory dysfunction, sacral decubitus ulcer, presented with generalized weakness.  Patient was admitted with working diagnosis of severe hyponatremia, hypokalemia and contraction alkalosis.   Assessment & Plan:   Generalized weakness - ongoing weakness - PT eval done again, SNF recommended, SNF placement in progress   Dehydration with hyponatremia and hypokalemia - Mg still low  - K also low - will supplement and repeat Mg in AM  Esophagitis candida  - no emesis overnight  - continue PPI ans sucralfate  - ensure sitting during meals   Sacral decubitus ulcer stage 2 - continue local wound care   Severe caloric protein malnutrition - continue nutritional supplements   - appreciate nutritionist team assistance   Alcohol abuse - no signs of withdrawal   DVT prophylaxis: enoxaparin  Code Status:  DNR Family Communication: pt at bedside  Disposition Plan: SNF when bed available   Consultants:   None  Procedures:   None  Antimicrobials:    None  Subjective: Pt denies any specific concerns this AM.   Objective: Vitals:   04/25/17 1337 04/25/17 2134 04/26/17 0620 04/26/17 1517  BP: (!) 141/90 126/82 119/82 119/86  Pulse: 97 99 96 (!) 101  Resp: 16 20 18 18   Temp: 98.5 F (36.9 C) 98.3 F (36.8 C) 98.2 F (36.8 C) 99.1 F (37.3 C)  TempSrc: Oral Oral Oral Oral  SpO2: 100% 97% 98% 100%  Weight:      Height:        Intake/Output Summary (Last 24 hours) at 04/26/2017 1625 Last data filed at 04/26/2017 0620 Gross per 24 hour  Intake -  Output 800 ml  Net -800 ml   Filed Weights   04/22/17 1207  Weight: 58.1 kg (128 lb)   Physical Exam  Constitutional: Appears calm, NAD CVS: Tachycardic, S1/S2 +, no gallops, no carotid bruit.  Pulmonary: Effort and breath  sounds normal, no stridor, rhonchi, wheezes, rales.  Abdominal: Soft. BS +,  no distension, tenderness, rebound or guarding.   Data Reviewed: I have personally reviewed following labs and imaging studies  CBC: Recent Labs  Lab 04/20/17 1845 04/21/17 0607 04/26/17 0638  WBC 8.0 4.9 5.2  NEUTROABS 5.3  --   --   HGB 9.0* 8.4* 8.5*  HCT 26.6* 24.8* 25.3*  MCV 97.8 99.2 98.1  PLT 405* 345 144   Basic Metabolic Panel: Recent Labs  Lab 04/20/17 1845 04/21/17 0607 04/24/17 0702 04/25/17 1552 04/26/17 0638  NA 135 130* 135 133* 137  K 3.4* 3.4* 3.6 3.7 3.4*  CL 107 104 107 105 108  CO2 23 22 22  19* 24  GLUCOSE 86 90 79 60* 76  BUN 5* <5* <5* <5* <5*  CREATININE 0.68 0.65 0.67 0.76 0.68  CALCIUM 8.0* 7.6* 8.0* 8.1* 8.2*  MG 1.3* 1.2*  --  1.5* 1.4*   Liver Function Tests: Recent Labs  Lab 04/20/17 1845 04/21/17 0607  AST 33 29  ALT 16* 15*  ALKPHOS 119 96  BILITOT 1.1 0.7  PROT 5.1* 4.5*  ALBUMIN 1.7* 1.5*   Radiology Studies: I have reviewed blood work, VS, imaging studies personally  Scheduled Meds: . enoxaparin (LOVENOX) injection  40 mg Subcutaneous Q24H  . feeding supplement  1 Container Oral TID WC  . ferrous sulfate  325 mg Oral  TID WC  . fluconazole  100 mg Oral Daily  . folic acid  1 mg Oral Daily  . Gerhardt's butt cream   Topical TID  . magnesium oxide  400 mg Oral Daily  . multivitamin with minerals  1 tablet Oral Daily  . nicotine  14 mg Transdermal Daily  . pantoprazole  40 mg Oral BID  . sucralfate  1 g Oral TID WC & HS  . thiamine  100 mg Oral Daily   Continuous Infusions: . sodium chloride 50 mL/hr at 04/25/17 1611     LOS: 6 days   Time spent 25 minutes   Faye Ramsay, MD [facility] Pager [phone]

## 2017-04-26 NOTE — Progress Notes (Signed)
Nutrition Follow-up  DOCUMENTATION CODES:   Severe malnutrition in context of chronic illness  INTERVENTION:   -D/c Ensure supplements -Continue Boost Breeze po TID, each supplement provides 250 kcal and 9 grams of protein -Continue Multivitamin with minerals daily  NUTRITION DIAGNOSIS:   Severe Malnutrition related to chronic illness(chronic alcoholism, esophagitis, dysphagia) as evidenced by severe fat depletion, severe muscle depletion, percent weight loss.  Ongoing.  GOAL:   Patient will meet greater than or equal to 90% of their needs  Not meeting.  MONITOR:   PO intake, Supplement acceptance, Labs, Weight trends, I & O's, Skin  REASON FOR ASSESSMENT:   Consult Assessment of nutrition requirement/status  ASSESSMENT:   67 year old male with a past medical history significant for protein calorie malnutrition, tobacco abuse, alcohol abuse, hypertension and a recent admission secondary to esophagitis with upper GI bleed and aspiration pneumonia.  Who presented to the hospital secondary to generalized weakness and failure to thrive. Family unable to care after him.  Pt has been declining nutritional supplements. Poor PO continues given emesis related to reflux. Will d/c Ensure supplements and encourage Boost Breeze.  No new weights taken.  Medications: Ferrous sulfate tablet TID, Folic acid tablet daily, MAG-OX tablet daily, Multivitamin with minerals daily, Thiamine tablet daily, IV Mg sulfate once Labs reviewed: Low K, Mg  Diet Order:  Diet regular Room service appropriate? Yes; Fluid consistency: Thin  EDUCATION NEEDS:   No education needs have been identified at this time  Skin:  Skin Integrity Issues:: Stage II Stage II: sacrum, buttocks  Last BM:  1/16  Height:   Ht Readings from Last 1 Encounters:  04/22/17 5\' 7"  (1.702 m)    Weight:   Wt Readings from Last 1 Encounters:  04/22/17 128 lb (58.1 kg)    Ideal Body Weight:  67.3 kg  BMI:  Body  mass index is 20.05 kg/m.  Estimated Nutritional Needs:   Kcal:  1800-2000  Protein:  90-100g  Fluid:  1.8L/day  Clayton Bibles, MS, RD, LDN [place] [facility] Pager: [phone] After Hours Pager: [phone]

## 2017-04-26 NOTE — Progress Notes (Addendum)
LCSW following for SNF placement.   Patient has no payor source and does not have medicaid and has not applied for medicaid. Patient reports that he can not pay privately for SNF.   Patient said he was seen by financial services a few days ago at [facility] and is supposed to receive a application by mail.   LCSW followed up with financial services. Patient may or may not have a educational savings account that could be a barrier to qualifying for medicaid.   According to financial services, patient reports that he has gotten rid of the funds within the last 24 hrs, but would not elaborate on the details and became agitated when probed.   LCSW has attempted several times to reach patients son by phone and has been unsuccessful. Son does not have a voicemail that is set up.   Patient does not want SNF and prefers to go home. Patient states that his son will help him.   PLAN: Patient will go home at DC.   Carolin Coy [place]

## 2017-04-27 DIAGNOSIS — F1029 Alcohol dependence with unspecified alcohol-induced disorder: Secondary | ICD-10-CM

## 2017-04-27 LAB — BASIC METABOLIC PANEL
Anion gap: 4 — ABNORMAL LOW (ref 5–15)
CO2: 23 mmol/L (ref 22–32)
CREATININE: 0.69 mg/dL (ref 0.61–1.24)
Calcium: 8 mg/dL — ABNORMAL LOW (ref 8.9–10.3)
Chloride: 108 mmol/L (ref 101–111)
Glucose, Bld: 83 mg/dL (ref 65–99)
Potassium: 3.6 mmol/L (ref 3.5–5.1)
SODIUM: 135 mmol/L (ref 135–145)

## 2017-04-27 LAB — MAGNESIUM: MAGNESIUM: 1.7 mg/dL (ref 1.7–2.4)

## 2017-04-27 MED ORDER — AMOXICILLIN-POT CLAVULANATE 875-125 MG PO TABS: 1.0000 | ORAL_TABLET | Freq: Two times a day (BID) | ORAL | 0 refills | Status: AC

## 2017-04-27 MED ORDER — MAGNESIUM OXIDE 400 (241.3 MG) MG PO TABS: 400.0000 mg | ORAL_TABLET | Freq: Every day | ORAL | 1 refills | Status: AC

## 2017-04-27 MED ORDER — GERHARDT'S BUTT CREAM: 1.0000 "application " | TOPICAL_CREAM | Freq: Three times a day (TID) | CUTANEOUS | 5 refills | Status: DC

## 2017-04-27 MED ORDER — TRAZODONE HCL 50 MG PO TABS: 50.0000 mg | ORAL_TABLET | Freq: Every evening | ORAL | 0 refills | Status: DC | PRN

## 2017-04-27 MED ORDER — SUCRALFATE 1 GM/10ML PO SUSP: 1.0000 g | Freq: Three times a day (TID) | ORAL | 0 refills | Status: DC

## 2017-04-27 MED ORDER — HYDROCODONE-ACETAMINOPHEN 5-325 MG PO TABS: 1.0000 | ORAL_TABLET | ORAL | 0 refills | Status: DC | PRN

## 2017-04-27 MED ORDER — FLUCONAZOLE 100 MG PO TABS: 100.0000 mg | ORAL_TABLET | Freq: Every day | ORAL | 0 refills | Status: DC

## 2017-04-27 NOTE — Progress Notes (Signed)
Date: April 27, 2017 Chart review for discharge needs:  RN, PT and aide arranged through [facility] due to patient with no insurance. Suanne Marker Davis,BSN,RN3,[phone]

## 2017-04-27 NOTE — Discharge Summary (Signed)
Physician Discharge Summary  Patrick [name] WGN:[id number] DOB: June 19, 1950 DOA: 04/20/2017  PCP: [name], MD  Admit date: 04/20/2017 Discharge date: 04/27/2017  Recommendations for Outpatient Follow-up:  1. Pt will need to follow up with PCP in 1-2 weeks post discharge 2. Please obtain BMP to evaluate electrolytes and kidney function, Mg level  3. Please also check CBC to evaluate Hg and Hct levels 4. Please note pt says he no longer takes metoprolol and this medication was therefor removed from his medical list   Discharge Diagnoses:  Active Problems:   TOBACCO USE   Hypertension   Alcohol dependence (HCC)   Hypokalemia   Erosive esophagitis   Pressure ulcer of sacral region, stage 2   Failure to thrive in adult  Discharge Condition: Stable  Diet recommendation: Heart healthy diet discussed in details   History of present illness:  Pt is 67 yo male with known history of alcohol use and ambulatory dysfunction, sacral decubitus ulcer, presented with generalized weakness.  Patient was admitted with working diagnosis of severe hyponatremia, hypokalemia and contraction alkalosis.   Assessment & Plan:   Generalized weakness - ongoing weakness - PT eval done again, SNF recommended, due to financial issues pt can not go to SNF - will be discharged home   Dehydration with hyponatremia and hypokalemia - electrolytes supplemented, will need outpatient follow up on electrolytes   Esophagitiscandida  - no emesis overnight  - continue PPI ans sucralfate  - sitting during meals   Sacral decubitus ulcer stage 2 - continue local wound care   Severe caloric protein malnutrition - continue nutritional supplements   - appreciate nutritionist team assistance   Alcohol abuse - no signs of withdrawal   DVT prophylaxis:enoxaparin Code Status:DNR Family Communication:pt at bedside  Disposition Plan: home    Consultants:  None  Procedures:  None  Antimicrobials:   None   Procedures/Studies: Dg Chest 2 View  Result Date: 04/09/2017 CLINICAL DATA:  Chronic cough and failure to thrive. EXAM: CHEST  2 VIEW COMPARISON:  12/20/2016 FINDINGS: The heart size and mediastinal contours are within normal limits. Minimal aortic atherosclerosis at the arch without aneurysm. Moderate aortic atherosclerosis, stable in appearance. Emphysematous hyperinflation of the lungs without pneumonic consolidation or CHF. Osteopenic appearance of the thoracic spine. No acute osseous abnormality. IMPRESSION: Hyperinflated lungs consistent with COPD. Electronically Signed   By: Ashley Royalty M.D.   On: 04/09/2017 18:14   Dg Chest Port 1 View  Result Date: 04/20/2017 CLINICAL DATA:  Weakness.  Shortness of breath.  Possible fever. EXAM: PORTABLE CHEST 1 VIEW COMPARISON:  04/13/2017 FINDINGS: Cardiomediastinal silhouette is normal. Mediastinal contours appear intact. Mild calcific atherosclerotic disease of the aorta. There is no evidence of focal airspace consolidation, pleural effusion or pneumothorax. Osseous structures are without acute abnormality. Soft tissues are grossly normal. IMPRESSION: No active disease. Electronically Signed   By: Fidela Salisbury M.D.   On: 04/20/2017 16:54   Dg Chest Port 1 View  Result Date: 04/13/2017 CLINICAL DATA:  Fever. EXAM: PORTABLE CHEST 1 VIEW COMPARISON:  04/12/2017 FINDINGS: Heart size and pulmonary vascularity are normal. Slight right lung base opacity again demonstrated, possibly representing pneumonia. Left lung bases clearing. No blunting of costophrenic angles. No pneumothorax. Mediastinal contours appear intact. Calcification of the aorta. Probable esophageal hiatal hernia behind the heart. Degenerative changes in the spine and shoulders. IMPRESSION: Persistent right lung base opacity may indicate pneumonia. Left lung bases clearing since previous study. No  pneumothorax or effusion. Aortic atherosclerosis.  Electronically Signed   By: Lucienne Capers M.D.   On: 04/13/2017 22:49   Dg Chest Port 1 View  Result Date: 04/12/2017 CLINICAL DATA:  Possible aspiration. Recent episodes of nausea and vomiting. EXAM: PORTABLE CHEST 1 VIEW COMPARISON:  Chest x-ray of April 10, 2017 and PA and lateral chest x-ray of April 09, 2017. FINDINGS: The lungs are well-expanded. There is increased density in the right infrahilar region likely in the lower lobe. The perihilar lung markings inferiorly on the left are increased as well. The heart and pulmonary vascularity are normal. There is no significant pleural effusion. There is calcification in the wall of the aortic arch. The bony thorax exhibits no acute abnormality. IMPRESSION: Increased densities likely in the lower lobes bilaterally worrisome for pneumonia, possibly related to aspiration. Thoracic aortic atherosclerosis. Electronically Signed   By: David  Martinique M.D.   On: 04/12/2017 07:03   Dg Chest Port 1 View  Result Date: 04/10/2017 CLINICAL DATA:  Pneumonia EXAM: PORTABLE CHEST 1 VIEW COMPARISON:  04/09/2017 FINDINGS: 1118 hours. The lungs are clear without focal pneumonia, edema, pneumothorax or pleural effusion. No The cardiopericardial silhouette is within normal limits for size. Small to moderate hiatal hernia. The visualized bony structures of the thorax are intact. Telemetry leads overlie the chest. IMPRESSION: No active disease. Electronically Signed   By: Misty Stanley M.D.   On: 04/10/2017 11:40    Discharge Exam: Vitals:   04/26/17 2043 04/27/17 0516  BP: 120/82 105/76  Pulse: 98 67  Resp: 18 18  Temp: 98.1 F (36.7 C) 98.1 F (36.7 C)  SpO2: 99% 96%   Vitals:   04/26/17 0620 04/26/17 1517 04/26/17 2043 04/27/17 0516  BP: 119/82 119/86 120/82 105/76  Pulse: 96 (!) 101 98 67  Resp: 18 18 18 18   Temp: 98.2 F (36.8 C) 99.1 F (37.3 C) 98.1 F (36.7 C) 98.1 F (36.7 C)  TempSrc: Oral  Oral Oral Oral  SpO2: 98% 100% 99% 96%  Weight:      Height:        General: Pt is alert, frail and tired appearing  Cardiovascular: Regular rate and rhythm, S1/S2 +, no murmurs, no rubs, no gallops Respiratory: Clear to auscultation bilaterally, no wheezing, no crackles, no rhonchi Abdominal: Soft, non tender, non distended, bowel sounds +, no guarding  Discharge Instructions  Discharge Instructions    Diet - low sodium heart healthy   Complete by:  As directed    Increase activity slowly   Complete by:  As directed      Allergies as of 04/27/2017   No Known Allergies     Medication List    STOP taking these medications   metoprolol tartrate 25 MG tablet Commonly known as:  LOPRESSOR     TAKE these medications   albuterol 108 (90 Base) MCG/ACT inhaler Commonly known as:  VENTOLIN HFA Inhale 2 puffs into the lungs every 4 (four) hours as needed for wheezing or shortness of breath.   amoxicillin-clavulanate 875-125 MG tablet Commonly known as:  AUGMENTIN Take 1 tablet by mouth every 12 (twelve) hours for 9 doses.   feeding supplement (ENSURE ENLIVE) Liqd Take 237 mLs by mouth 2 (two) times daily between meals.   ferrous sulfate 325 (65 FE) MG tablet Take 1 tablet (325 mg total) by mouth 3 (three) times daily with meals.   fluconazole 100 MG tablet Commonly known as:  DIFLUCAN Take 1 tablet (100 mg total) by mouth daily.   folic acid 1  MG tablet Commonly known as:  FOLVITE Take 1 tablet (1 mg total) by mouth daily.   Gerhardt's butt cream Crea Apply 1 application topically 3 (three) times daily.   HYDROcodone-acetaminophen 5-325 MG tablet Commonly known as:  NORCO/VICODIN Take 1-2 tablets by mouth every 4 (four) hours as needed for moderate pain or severe pain.   magnesium oxide 400 (241.3 Mg) MG tablet Commonly known as:  MAG-OX Take 1 tablet (400 mg total) by mouth daily. Start taking on:  04/28/2017   nicotine 21 mg/24hr patch Commonly known as:   NICODERM CQ - dosed in mg/24 hours Place 1 patch (21 mg total) onto the skin daily.   pantoprazole 40 MG tablet Commonly known as:  PROTONIX Take 1 tablet (40 mg total) by mouth 2 (two) times daily.   sucralfate 1 GM/10ML suspension Commonly known as:  CARAFATE Take 10 mLs (1 g total) by mouth 4 (four) times daily -  with meals and at bedtime.   thiamine 100 MG tablet Take 1 tablet (100 mg total) by mouth daily.   traZODone 50 MG tablet Commonly known as:  DESYREL Take 1 tablet (50 mg total) by mouth at bedtime as needed for sleep.       Follow-up Information    Burchette, Alinda Sierras, MD Follow up.   Specialty:  Family Medicine Contact information: [place] [address] [phone]        Theodis Blaze, MD Follow up.   Specialty:  Internal Medicine Why:  call me questions [phone] Contact information: [address] [place] [address] [phone]            The results of significant diagnostics from this hospitalization (including imaging, microbiology, ancillary and laboratory) are listed below for reference.     Microbiology: No results found for this or any previous visit (from the past 240 hour(s)).   Labs: Basic Metabolic Panel: Recent Labs  Lab 04/20/17 1845 04/21/17 3664 04/24/17 0702 04/25/17 1552 04/26/17 0638 04/27/17 0445  NA 135 130* 135 133* 137 135  K 3.4* 3.4* 3.6 3.7 3.4* 3.6  CL 107 104 107 105 108 108  CO2 23 22 22  19* 24 23  GLUCOSE 86 90 79 60* 76 83  BUN 5* <5* <5* <5* <5* <5*  CREATININE 0.68 0.65 0.67 0.76 0.68 0.69  CALCIUM 8.0* 7.6* 8.0* 8.1* 8.2* 8.0*  MG 1.3* 1.2*  --  1.5* 1.4* 1.7   Liver Function Tests: Recent Labs  Lab 04/20/17 1845 04/21/17 0607  AST 33 29  ALT 16* 15*  ALKPHOS 119 96  BILITOT 1.1 0.7  PROT 5.1* 4.5*  ALBUMIN 1.7* 1.5*   CBC: Recent Labs  Lab 04/20/17 1845 04/21/17 0607 04/26/17 0638  WBC 8.0 4.9 5.2  NEUTROABS 5.3  --   --   HGB  9.0* 8.4* 8.5*  HCT 26.6* 24.8* 25.3*  MCV 97.8 99.2 98.1  PLT 405* 345 318    SIGNED: Time coordinating discharge: 45 minutes  Faye Ramsay, MD  [facility] 04/27/2017, 10:22 AM Pager [phone]  If 7PM-7AM, please contact night-coverage [email or web address] Password [email or web address]

## 2017-04-27 NOTE — Discharge Instructions (Signed)

## 2017-04-27 NOTE — Progress Notes (Signed)
Occupational Therapy Treatment Patient Details Name: Patrick [name] MRN: [id number] DOB: [birth year] Today's Date: 04/27/2017    History of present illness 67 year old male with a past medical history significant for protein calorie malnutrition, tobacco abuse, alcohol abuse, hypertension and a recent admission secondary to esophagitis with upper GI bleed and aspiration pneumonia.  Who presented to the hospital secondary to generalized weakness and failure to thrive. Family unable to care after him.   OT comments  Pt needed lots of encouragement to participate with OT.   Explained importance of getting OOB   Follow Up Recommendations  SNF;Supervision/Assistance - 24 hour    Equipment Recommendations  None recommended by OT    Recommendations for Other Services      Precautions / Restrictions Precautions Precautions: Fall Restrictions Weight Bearing Restrictions: No       Mobility Bed Mobility Overal bed mobility: Needs Assistance Bed Mobility: Sit to Supine       Sit to supine: Supervision   General bed mobility comments: cues for positioning  Transfers Overall transfer level: Needs assistance Equipment used: Rolling walker (2 wheeled) Transfers: Sit to/from [facility] Sit to Stand: Min guard Stand pivot transfers: Min guard                ADL either performed or assessed with clinical judgement   ADL Overall ADL's : Needs assistance/impaired             Lower Body Bathing: Minimal assistance;Sit to/from stand   Upper Body Dressing : Set up;Sitting   Lower Body Dressing: Minimal assistance;Sit to/from stand   Toilet Transfer: Minimal assistance;Stand-pivot;BSC;RW   Toileting- Clothing Manipulation and Hygiene: Minimal assistance;Sit to/from stand         General ADL Comments: pt plans on going home.  Son will A him per pt.  Pt declines SNF and states he doesnt want HH               Cognition Arousal/Alertness:  Awake/alert Behavior During Therapy: WFL for tasks assessed/performed Overall Cognitive Status: No family/caregiver present to determine baseline cognitive functioning                                 General Comments: Pt continues with decreased awareness and decreased judgement                    Pertinent Vitals/ Pain       Pain Assessment: 0-10 Pain Score: 3  Pain Location: buttocks Pain Descriptors / Indicators: Sore Pain Intervention(s): Limited activity within patient's tolerance;Monitored during session     Prior Functioning/Environment              Frequency  Min 2X/week        Progress Toward Goals  OT Goals(current goals can now be found in the care plan section)  Progress towards OT goals: Progressing toward goals     Plan Discharge plan remains appropriate    Co-evaluation                 AM-PAC PT "6 Clicks" Daily Activity     Outcome Measure   Help from another person eating meals?: None Help from another person taking care of personal grooming?: A Little Help from another person toileting, which includes using toliet, bedpan, or urinal?: A Little Help from another person bathing (including washing, rinsing, drying)?: A Little Help from another person to put on and  taking off regular upper body clothing?: A Little Help from another person to put on and taking off regular lower body clothing?: A Little 6 Click Score: 19    End of Session Equipment Utilized During Treatment: Rolling walker  OT Visit Diagnosis: Unsteadiness on feet (R26.81);History of falling (Z91.81);Other abnormalities of gait and mobility (R26.89);Pain   Activity Tolerance Patient limited by fatigue   Patient Left in chair;with call bell/phone within reach;with chair alarm set;with nursing/sitter in room   Nurse Communication Mobility status        Time: 0109-3235 OT Time Calculation (min): 21 min  Charges: OT General Charges $OT Visit: 1  Visit OT Treatments $Self Care/Home Management : 8-22 mins  [name], [name]   [name] 04/27/2017, 11:31 AM

## 2017-04-30 ENCOUNTER — Telehealth: Payer: Self-pay | Admitting: *Deleted

## 2017-04-30 NOTE — Telephone Encounter (Signed)
Transition Care Management Follow-up Telephone Call  Per Discharge Summary:  PCP: [name], MD  Admit date: 04/20/2017 Discharge date: 04/27/2017  Recommendations for Outpatient Follow-up:  1. Pt will need to follow up with PCP in 1-2 weeks post discharge 2. Please obtain BMP to evaluate electrolytes and kidney function, Mg level  3. Please also check CBC to evaluate Hg and Hct levels 4. Please note pt says he no longer takes metoprolol and this medication was therefor removed from his medical list   Discharge Diagnoses:  Active Problems:   TOBACCO USE   Hypertension   Alcohol dependence ([place])   Hypokalemia   Erosive esophagitis   Pressure ulcer of sacral region, stage 2   Failure to thrive in adult  Discharge Condition: Stable  Diet recommendation: Heart healthy diet discussed in details   --   How have you been since you were released from the hospital? "Not real great. I still can't walk and I've got pains and that's about it." Patient does report he is at baseline since discharge and does not feel acutely worse.   Do you understand why you were in the hospital? yes   Do you understand the discharge instructions? yes   Where were you discharged to? Home   Items Reviewed:  Medications reviewed: yes  Allergies reviewed: yes  Dietary changes reviewed: yes  Referrals reviewed: yes, [facility]    Functional Questionnaire:   Activities of Daily Living (ADLs):   He states they are independent in the following: feeding, continence and toileting States they require assistance with the following: ambulation, bathing and hygiene and dressing. Son/family can assist w/ some ADLs, but otherwise he is largely sedentary.   Any transportation issues/concerns?: no, son will drive him to appts   Any patient concerns? no   Confirmed importance and date/time of follow-up visits scheduled yes  Provider Appointment booked with Dr. Carolann Littler 05/04/17 @ 10:45am  Confirmed with patient if condition begins to worsen call PCP or go to the ER.  Patient was given the office number and encouraged to call back with question or concerns.  : yes

## 2017-05-04 ENCOUNTER — Ambulatory Visit (INDEPENDENT_AMBULATORY_CARE_PROVIDER_SITE_OTHER): Payer: Self-pay | Admitting: Family Medicine

## 2017-05-04 ENCOUNTER — Encounter: Payer: Self-pay | Admitting: Family Medicine

## 2017-05-04 VITALS — BP 120/60 | HR 123 | Temp 98.4°F

## 2017-05-04 DIAGNOSIS — K209 Esophagitis, unspecified without bleeding: Secondary | ICD-10-CM

## 2017-05-04 DIAGNOSIS — K2210 Ulcer of esophagus without bleeding: Secondary | ICD-10-CM

## 2017-05-04 DIAGNOSIS — E871 Hypo-osmolality and hyponatremia: Secondary | ICD-10-CM

## 2017-05-04 DIAGNOSIS — L89301 Pressure ulcer of unspecified buttock, stage 1: Secondary | ICD-10-CM

## 2017-05-04 DIAGNOSIS — E43 Unspecified severe protein-calorie malnutrition: Secondary | ICD-10-CM

## 2017-05-04 DIAGNOSIS — F1029 Alcohol dependence with unspecified alcohol-induced disorder: Secondary | ICD-10-CM

## 2017-05-04 LAB — COMPREHENSIVE METABOLIC PANEL
ALT: 12 U/L (ref 0–53)
AST: 22 U/L (ref 0–37)
Albumin: 2.4 g/dL — ABNORMAL LOW (ref 3.5–5.2)
Alkaline Phosphatase: 111 U/L (ref 39–117)
BILIRUBIN TOTAL: 0.9 mg/dL (ref 0.2–1.2)
BUN: 6 mg/dL (ref 6–23)
CALCIUM: 8.7 mg/dL (ref 8.4–10.5)
CO2: 26 mEq/L (ref 19–32)
Chloride: 98 mEq/L (ref 96–112)
Creatinine, Ser: 0.65 mg/dL (ref 0.40–1.50)
GFR: 131.3 mL/min (ref >60.00)
GLUCOSE: 102 mg/dL — AB (ref 70–99)
Potassium: 3.5 mEq/L (ref 3.5–5.1)
Sodium: 134 mEq/L — ABNORMAL LOW (ref 135–145)
TOTAL PROTEIN: 6.3 g/dL (ref 6.0–8.3)

## 2017-05-04 LAB — CBC WITH DIFFERENTIAL/PLATELET
BASOS ABS: 0 10*3/uL (ref 0.0–0.1)
Basophils Relative: 0.2 % (ref 0.0–3.0)
Eosinophils Absolute: 0 10*3/uL (ref 0.0–0.7)
Eosinophils Relative: 0.1 % (ref 0.0–5.0)
HEMATOCRIT: 35.3 % — AB (ref 39.0–52.0)
Hemoglobin: 11.6 g/dL — ABNORMAL LOW (ref 13.0–17.0)
Lymphs Abs: 1.1 10*3/uL (ref 0.7–4.0)
MCHC: 32.8 g/dL (ref 30.0–36.0)
MCV: 97.2 fl (ref 78.0–100.0)
MONOS PCT: 7.3 % (ref 3.0–12.0)
Monocytes Absolute: 1.2 10*3/uL — ABNORMAL HIGH (ref 0.1–1.0)
Neutro Abs: 13.4 10*3/uL — ABNORMAL HIGH (ref 1.4–7.7)
Neutrophils Relative %: 85.3 % — ABNORMAL HIGH (ref 43.0–77.0)
Platelets: 303 10*3/uL (ref 150.0–400.0)
RBC: 3.63 Mil/uL — AB (ref 4.22–5.81)
RDW: 15.4 % (ref 11.5–15.5)
WBC: 15.7 10*3/uL — ABNORMAL HIGH (ref 4.0–10.5)

## 2017-05-04 LAB — MAGNESIUM: Magnesium: 1.4 mg/dL — ABNORMAL LOW (ref 1.5–2.5)

## 2017-05-04 MED ORDER — FLUCONAZOLE 100 MG PO TABS: 100.0000 mg | ORAL_TABLET | Freq: Every day | ORAL | 0 refills | Status: DC

## 2017-05-04 NOTE — Patient Instructions (Signed)
Get back on Multivitamin with thiamine.

## 2017-05-04 NOTE — Progress Notes (Signed)
Subjective:     Patient ID: Patrick [name], male   DOB: 09-24-1950, 67 y.o.   MRN: [id number]  HPI Patient seen for hospital follow-up. He's actually had multiple hospitalizations recently related to complications from alcoholism. We have not seen him in quite some time. Most recent admission was 04/20/17 through 04/27/17. Patient is accompanied today by his son. Very poor social situation. Son recently moved back home to help him out. He has 2 sons that have had problems with alcohol and drug addiction themselves  Patient has long-standing history of alcohol abuse, smoking, recent upper GI bleed, hypertension history, erosive esophagitis, essential tremor, alcohol induced Korsakoff syndrome, recent sacral decubiti, protein calorie malnutrition, hyponatremia, hypomagnesemia, failure to thrive in adulthood  Recent admission notes reviewed with patient and son. He presented with generalized weakness on most recent admission. He had severe hyponatremia and hypokalemia and contraction alkalosis. Skilled nursing facility was recommended but due to financial issues he could not go. He was discharged back home. He's basically unable to ambulate much at this time because of weakness. He is still using alcohol. He states he is drinking gin- about 2 drinks per day.  Recent Candida esophagitis. He was discharged on fluconazole but apparently not taking. He does remain on proton pump tender and sucralfate. Very poor nutritional intake. He's had significant input from nutritional team in the hospital.  Past Medical History:  Diagnosis Date  . Colon polyps   . Depression   . Diverticulitis   . Diverticulosis of colon   . GERD (gastroesophageal reflux disease)   . Kidney stone    Past Surgical History:  Procedure Laterality Date  . COLONOSCOPY    . ESOPHAGOGASTRODUODENOSCOPY (EGD) WITH PROPOFOL N/A 12/21/2016   Procedure: ESOPHAGOGASTRODUODENOSCOPY (EGD) WITH PROPOFOL;  Surgeon: Doran Stabler, MD;   Location: [facility];  Service: Gastroenterology;  Laterality: N/A;  . UPPER GASTROINTESTINAL ENDOSCOPY      reports that he has been smoking cigarettes.  He has been smoking about 1.00 pack per day. he has never used smokeless tobacco. He reports that he drinks about 8.4 oz of alcohol per week. He reports that he does not use drugs. family history includes Heart disease (age of onset: 15) in his father. No Known Allergies    Review of Systems  Constitutional: Positive for fatigue. Negative for chills and unexpected weight change.  Respiratory: Negative for wheezing.   Cardiovascular: Positive for leg swelling. Negative for chest pain and palpitations.  Gastrointestinal: Negative for abdominal pain and diarrhea.  Genitourinary: Negative for dysuria.  Neurological: Positive for weakness. Negative for seizures and syncope.  Psychiatric/Behavioral: Negative for confusion.       Objective:   Physical Exam  Constitutional:  Alert cooperative thin somewhat disheveled appearing 32 old male  HENT:  Mouth/Throat: Oropharynx is clear and moist.  Neck: Neck supple.  Cardiovascular:  Slightly tachycardic but regular  Pulmonary/Chest:  Somewhat diminished breath sounds throughout. He has a few scattered wheezes.  Musculoskeletal: He exhibits edema.  Trace pitting edema lower legs bilaterally  Neurological: He is alert.  Psychiatric:  Somewhat bland affect. Appears somewhat withdrawn. Cooperative and answers questions appropriately       Assessment:     #1 long-standing history of alcohol abuse with multiple complications including recent upper GI bleed and erosive esophagitis  #2 adult failure to thrive with severe protein calorie malnutrition  #3 recent electrolyte disturbance with hyponatremia, hypokalemia, and hypomagnesemia all likely related to poor intake and alcoholism  #4 history  of Candida esophagitis  #5 history of recent sacral decubitus ulcers improving  #6  probable COPD with ongoing nicotine use     Plan:     -Long discussion with patient's son regarding goals of care and goals in general. We reminded him that without total abstinence his prognosis is very bleak. Not clear he has any significant motivation to quit drinking. He does admit that this is creating significant health concerns -Patient is trying to get Medicaid which would facilitate more options for potential skilled nursing facility placement -Recheck labs with CBC, comprehensive metabolic panel, magnesium level -We stressed importance of no alcohol whatsoever. Patient initially stated he would try to cut back to one drink a day but we explained that total abstinence is the only thing that will work with his history of alcohol abuse  [name] MD [facility]

## 2017-05-08 ENCOUNTER — Ambulatory Visit: Payer: Self-pay | Admitting: Nurse Practitioner

## 2017-05-08 ENCOUNTER — Telehealth: Payer: Self-pay | Admitting: Family Medicine

## 2017-05-08 MED ORDER — BOUDREAUXS BABY BUTT SMOOTH EX OINT: TOPICAL_OINTMENT | CUTANEOUS | 0 refills | Status: DC

## 2017-05-08 NOTE — Telephone Encounter (Signed)
Ok to call in

## 2017-05-08 NOTE — Telephone Encounter (Signed)
Copied from [place] [id number]. Topic: Quick Communication - Rx Refill/Question >> May 08, 2017 11:49 AM Scherrie Gerlach wrote: Medication: Hydrocortisone (GERHARDT'S BUTT CREAM) CREAM   (Unable to find)  Pt states the pharmacy advised him they have a comparable med.  Pt has found this at [place]   Would like Dr to call in a new Rx for this to [facility]

## 2017-05-11 ENCOUNTER — Telehealth: Payer: Self-pay | Admitting: Family Medicine

## 2017-05-11 ENCOUNTER — Other Ambulatory Visit: Payer: Self-pay | Admitting: Family Medicine

## 2017-05-11 NOTE — Telephone Encounter (Signed)
Copied from [place] [id number]. Topic: Quick Communication - Rx Refill/Question >> May 11, 2017  3:27 PM Patrice Paradise wrote: Medication: traZODone (DESYREL) 50 MG tablet    Has the patient contacted their pharmacy? Yes.     (Agent: If no, request that the patient contact the pharmacy for the refill.)   Preferred Pharmacy (with phone number or street name):   [facility] [place] - [address] [place] [address] Phone: [phone] Fax: [phone]     Agent: Please be advised that RX refills may take up to 3 business days. We ask that you follow-up with your pharmacy.

## 2017-05-11 NOTE — Telephone Encounter (Signed)
Call attempt for additional information R/T refill request. Unable to leave message, "mailbox full."

## 2017-05-12 ENCOUNTER — Telehealth: Payer: Self-pay | Admitting: Family Medicine

## 2017-05-12 NOTE — Telephone Encounter (Signed)
Pt called the nurse line stating he needs a Rx traZODone (DESYREL) 50 MG tablet it was prescribed when was in the hospital. Please advise?  Pharmacy is [facility], [place]  Call pt @ [phone]. Thank you!

## 2017-05-12 NOTE — Telephone Encounter (Signed)
Refill with 2 addtitional refills.

## 2017-05-14 MED ORDER — TRAZODONE HCL 50 MG PO TABS: 50.0000 mg | ORAL_TABLET | Freq: Every day | ORAL | 2 refills | Status: AC

## 2017-05-14 NOTE — Telephone Encounter (Signed)
Rx done. 

## 2017-05-14 NOTE — Addendum Note (Signed)
Addended by: Agnes Lawrence on: 05/14/2017 09:31 AM   Modules accepted: Orders

## 2017-05-14 NOTE — Telephone Encounter (Signed)
Refill already sent in. this Refill not sent.

## 2017-05-14 NOTE — Telephone Encounter (Signed)
Refills OK. 

## 2017-05-15 ENCOUNTER — Ambulatory Visit: Payer: Self-pay | Admitting: Nurse Practitioner

## 2017-05-23 ENCOUNTER — Ambulatory Visit (INDEPENDENT_AMBULATORY_CARE_PROVIDER_SITE_OTHER): Payer: Self-pay | Admitting: Nurse Practitioner

## 2017-05-23 ENCOUNTER — Other Ambulatory Visit (INDEPENDENT_AMBULATORY_CARE_PROVIDER_SITE_OTHER): Payer: Self-pay

## 2017-05-23 ENCOUNTER — Encounter: Payer: Self-pay | Admitting: Nurse Practitioner

## 2017-05-23 VITALS — BP 100/60 | HR 86 | Ht 67.0 in | Wt 105.0 lb

## 2017-05-23 DIAGNOSIS — D649 Anemia, unspecified: Secondary | ICD-10-CM

## 2017-05-23 DIAGNOSIS — K209 Esophagitis, unspecified without bleeding: Secondary | ICD-10-CM

## 2017-05-23 DIAGNOSIS — Z1211 Encounter for screening for malignant neoplasm of colon: Secondary | ICD-10-CM

## 2017-05-23 DIAGNOSIS — R634 Abnormal weight loss: Secondary | ICD-10-CM

## 2017-05-23 LAB — CBC
HEMATOCRIT: 33.8 % — AB (ref 39.0–52.0)
HEMOGLOBIN: 11.2 g/dL — AB (ref 13.0–17.0)
MCHC: 33.2 g/dL (ref 30.0–36.0)
MCV: 95.7 fl (ref 78.0–100.0)
Platelets: 314 10*3/uL (ref 150.0–400.0)
RBC: 3.54 Mil/uL — ABNORMAL LOW (ref 4.22–5.81)
RDW: 14.7 % (ref 11.5–15.5)
WBC: 7.2 10*3/uL (ref 4.0–10.5)

## 2017-05-23 MED ORDER — PANTOPRAZOLE SODIUM 40 MG PO TBEC: 40.0000 mg | DELAYED_RELEASE_TABLET | Freq: Two times a day (BID) | ORAL | 3 refills | Status: DC

## 2017-05-23 NOTE — Progress Notes (Signed)
IMPRESSION and PLAN:     22. 67 year old male recently hospitalized with recurrent , severe normocytic anemia felt to be secondary to known severe LA grade D esophagitis, possibly nutritional deficiencies too. Appropriate response to blood transfusion, most recent hgb 11.6.  -Not taking iron prescribed at time of discharge, says he didn't get the Rx for it.  His ferritin was actually elevated to 837 ( ? ETOH related). Will check CBC today and if hgb still holding will likely not start the iron. -poor candidate for colonoscopy but the eventual need for one still exists given the anemia and weight loss. .      2. Severe chest discomfort with most PO intake / significant weight loss. Tolerating only milkshakes, yogurt. This may be secondary to severe reflux esophagitis. Other etiologies not excluded. No evidence for malignancy on CT scan of abd / pelvis with contrast late September. -Not taking Carafate as prescribed. Advised AC and HS.  -taking PPI once daily, it was prescribed BID. Advised him to increase PPI to BID for now. -wedge pillow recommended -to bed on empty stomach -if no improvement in 2-3 weeks then consider repeat EGD. He will call with a condition update.   3. ETOH abuse / anxiety. Trazadone helps anxiety and reduces desire to drink but he is taking THREE a day and I think this dose exceeds what was intended.  -Advised patient and his son to contact PCP about the Trazadone dosage and not to take more than one / day until in contact with PCP.   4. Colon cancer screening. Patient is overdue for colonoscopy, due for repeat two years ago. He does not want a colonoscopy and he is a poor candidate for one but it may have to be considered if weight loss / anemia persists.    HPI:    Chief Complaint:    Patient is a 67 yo male with a hx of severe esophagitis and allcoholism. He is known to Dr. Fuller Plan but more recently to some of our other physician through recent  hospitalizations. Patient was seen in the hospital in Sept for evaluation of hematemesis and acute on chronic anemia. EGD revealed severe esophagitis and a 10 cm hiatal hernia.  He had severe esophagitis on previous EGDs as well. He responded appropriately to blood transfusions .  Readmitted early January with recurrent, severe anemia.  Hemoglobin down to 5 0 from 8 range.  Patient was not sure if he had been taking a PPI at home .  Presentation was similar to previous admissions so repeat EGD not done. He is here for hospital follow up.    Patient was recently started on trazodone which helps his anxiety and leads to decreased desire to drink.  He has not had alcohol though admits to taking more than prescribed dosage (sometimes up to 3 trazadone a day).   Richardson Landry has terrible terrible pain in chest after eating. Pain is hard to describe but burning is how he describes it. Ice cream and milkshakes and yogurt are about the only things he can tolerate. Doesn't like the taste of Ensure, but going to try Boost. Weight down from 128 pounds mid January down to 105 today.  Taking Pantoprazole but only once daily as written on bottle but hospital discharge lmeds list state BID. He hasn't been taking Carafate as prescribed. No blood in stool. No hematemesis.   ROS: No SOB, positive for weakness.   Past Medical History:  Diagnosis Date  .  Colon polyps   . Depression   . Diverticulitis   . Diverticulosis of colon   . GERD (gastroesophageal reflux disease)   . Kidney stone    Patient's surgical history, family medical history, social history, medications and allergies were all reviewed in Epic    Review of systems:    Physical Exam:     BP 100/60   Pulse 86   Ht 5\' 7"  (1.702 m)   Wt 105 lb (47.6 kg)   BMI 16.45 kg/m   GENERAL:  Chronically ill thin white male in NAD PSYCH: :Pleasant, cooperative, normal affect EENT:  conjunctiva pink, mucous membranes moist, no white plaques in oral cavity.  Neck supple without masses CARDIAC:  RRR, no peripheral edema PULM: Normal respiratory effort, lungs CTA bilaterally, no wheezing ABDOMEN:  Limited exam in wheelchair. Abd nondistended, soft, nontender. No obvious masses, no hepatomegaly,  normal bowel sounds SKIN:  turgor, no lesions seen NEURO: Alert and oriented x 3, no focal neurologic deficits   Tye Savoy , NP 05/23/2017, 11:08 AM

## 2017-05-23 NOTE — Patient Instructions (Addendum)
If you are age 65 or older, your body mass index should be between 23-30. Your Body mass index is 16.45 kg/m. If this is out of the aforementioned range listed, please consider follow up with your Primary Care Provider.  If you are age 70 or younger, your body mass index should be between 19-25. Your Body mass index is 16.45 kg/m. If this is out of the aformentioned range listed, please consider follow up with your Primary Care Provider.   Your physician has requested that you go to the basement for the following lab work before leaving today: CBC  We have sent the following medications to your pharmacy for you to pick up at your convenience: Pantoprazole take twice daily before meals.  Take Carafate as prescribed - before meals and at bedtime.  Use a wedge pillow.  Go to bed on an empty stomach.  Call in two weeks with an update.  If no improvement will consider repeat EGD.  Thank you for choosing me and [facility].   Tye Savoy, NP

## 2017-05-24 ENCOUNTER — Encounter: Payer: Self-pay | Admitting: Nurse Practitioner

## 2017-05-28 ENCOUNTER — Telehealth: Payer: Self-pay

## 2017-05-28 NOTE — Progress Notes (Signed)
Reviewed and agree with initial management plan.  Ilda Laskin T. Santiago Stenzel, MD FACG 

## 2017-05-29 NOTE — Telephone Encounter (Signed)
Lab results (CBC) closed before contact was documented. Patient has been advised of his lab results. He will not start taking iron and will follow the plan outlined in his recent visit.

## 2017-06-01 ENCOUNTER — Ambulatory Visit: Payer: Self-pay | Admitting: Family Medicine

## 2017-06-04 ENCOUNTER — Encounter (HOSPITAL_COMMUNITY): Payer: Self-pay | Admitting: Emergency Medicine

## 2017-06-04 ENCOUNTER — Telehealth: Payer: Self-pay | Admitting: *Deleted

## 2017-06-04 DIAGNOSIS — F1010 Alcohol abuse, uncomplicated: Secondary | ICD-10-CM | POA: Diagnosis present

## 2017-06-04 DIAGNOSIS — Z681 Body mass index (BMI) 19 or less, adult: Secondary | ICD-10-CM

## 2017-06-04 DIAGNOSIS — R1310 Dysphagia, unspecified: Secondary | ICD-10-CM | POA: Diagnosis present

## 2017-06-04 DIAGNOSIS — K2210 Ulcer of esophagus without bleeding: Principal | ICD-10-CM | POA: Diagnosis present

## 2017-06-04 DIAGNOSIS — R5381 Other malaise: Secondary | ICD-10-CM | POA: Diagnosis present

## 2017-06-04 DIAGNOSIS — R700 Elevated erythrocyte sedimentation rate: Secondary | ICD-10-CM | POA: Diagnosis present

## 2017-06-04 DIAGNOSIS — K5730 Diverticulosis of large intestine without perforation or abscess without bleeding: Secondary | ICD-10-CM | POA: Diagnosis present

## 2017-06-04 DIAGNOSIS — R4702 Dysphasia: Secondary | ICD-10-CM | POA: Diagnosis present

## 2017-06-04 DIAGNOSIS — R627 Adult failure to thrive: Secondary | ICD-10-CM | POA: Diagnosis present

## 2017-06-04 DIAGNOSIS — E871 Hypo-osmolality and hyponatremia: Secondary | ICD-10-CM | POA: Diagnosis present

## 2017-06-04 DIAGNOSIS — E43 Unspecified severe protein-calorie malnutrition: Secondary | ICD-10-CM | POA: Diagnosis present

## 2017-06-04 DIAGNOSIS — F329 Major depressive disorder, single episode, unspecified: Secondary | ICD-10-CM | POA: Diagnosis present

## 2017-06-04 DIAGNOSIS — K222 Esophageal obstruction: Secondary | ICD-10-CM | POA: Diagnosis present

## 2017-06-04 DIAGNOSIS — E860 Dehydration: Secondary | ICD-10-CM | POA: Diagnosis present

## 2017-06-04 DIAGNOSIS — K5900 Constipation, unspecified: Secondary | ICD-10-CM | POA: Diagnosis present

## 2017-06-04 DIAGNOSIS — G9341 Metabolic encephalopathy: Secondary | ICD-10-CM | POA: Diagnosis present

## 2017-06-04 DIAGNOSIS — K210 Gastro-esophageal reflux disease with esophagitis: Secondary | ICD-10-CM | POA: Diagnosis present

## 2017-06-04 DIAGNOSIS — K449 Diaphragmatic hernia without obstruction or gangrene: Secondary | ICD-10-CM | POA: Diagnosis present

## 2017-06-04 DIAGNOSIS — D473 Essential (hemorrhagic) thrombocythemia: Secondary | ICD-10-CM | POA: Diagnosis present

## 2017-06-04 DIAGNOSIS — Z8719 Personal history of other diseases of the digestive system: Secondary | ICD-10-CM

## 2017-06-04 DIAGNOSIS — E875 Hyperkalemia: Secondary | ICD-10-CM | POA: Diagnosis present

## 2017-06-04 DIAGNOSIS — F17210 Nicotine dependence, cigarettes, uncomplicated: Secondary | ICD-10-CM | POA: Diagnosis present

## 2017-06-04 DIAGNOSIS — Z79899 Other long term (current) drug therapy: Secondary | ICD-10-CM

## 2017-06-04 DIAGNOSIS — T471X6A Underdosing of other antacids and anti-gastric-secretion drugs, initial encounter: Secondary | ICD-10-CM | POA: Diagnosis present

## 2017-06-04 DIAGNOSIS — Z5329 Procedure and treatment not carried out because of patient's decision for other reasons: Secondary | ICD-10-CM | POA: Diagnosis not present

## 2017-06-04 DIAGNOSIS — N179 Acute kidney failure, unspecified: Secondary | ICD-10-CM | POA: Diagnosis present

## 2017-06-04 DIAGNOSIS — D649 Anemia, unspecified: Secondary | ICD-10-CM | POA: Diagnosis present

## 2017-06-04 DIAGNOSIS — Z66 Do not resuscitate: Secondary | ICD-10-CM | POA: Diagnosis present

## 2017-06-04 DIAGNOSIS — R918 Other nonspecific abnormal finding of lung field: Secondary | ICD-10-CM | POA: Diagnosis present

## 2017-06-04 LAB — CBC
HEMATOCRIT: 40.8 % (ref 39.0–52.0)
HEMOGLOBIN: 13.8 g/dL (ref 13.0–17.0)
MCH: 31.2 pg (ref 26.0–34.0)
MCHC: 33.8 g/dL (ref 30.0–36.0)
MCV: 92.1 fL (ref 78.0–100.0)
Platelets: 470 10*3/uL — ABNORMAL HIGH (ref 150–400)
RBC: 4.43 MIL/uL (ref 4.22–5.81)
RDW: 13.1 % (ref 11.5–15.5)
WBC: 9.8 10*3/uL (ref 4.0–10.5)

## 2017-06-04 LAB — COMPREHENSIVE METABOLIC PANEL
ALBUMIN: 3.7 g/dL (ref 3.5–5.0)
ALT: 20 U/L (ref 17–63)
ANION GAP: 14 (ref 5–15)
AST: 47 U/L — ABNORMAL HIGH (ref 15–41)
Alkaline Phosphatase: 216 U/L — ABNORMAL HIGH (ref 38–126)
BILIRUBIN TOTAL: 0.9 mg/dL (ref 0.3–1.2)
BUN: 28 mg/dL — ABNORMAL HIGH (ref 6–20)
CHLORIDE: 94 mmol/L — AB (ref 101–111)
CO2: 26 mmol/L (ref 22–32)
Calcium: 10.5 mg/dL — ABNORMAL HIGH (ref 8.9–10.3)
Creatinine, Ser: 1.7 mg/dL — ABNORMAL HIGH (ref 0.61–1.24)
GFR calc Af Amer: 47 mL/min — ABNORMAL LOW (ref >60)
GFR calc non Af Amer: 41 mL/min — ABNORMAL LOW (ref >60)
Glucose, Bld: 116 mg/dL — ABNORMAL HIGH (ref 65–99)
POTASSIUM: 4 mmol/L (ref 3.5–5.1)
Sodium: 134 mmol/L — ABNORMAL LOW (ref 135–145)
TOTAL PROTEIN: 8.7 g/dL — AB (ref 6.5–8.1)

## 2017-06-04 LAB — LIPASE, BLOOD: LIPASE: 25 U/L (ref 11–51)

## 2017-06-04 MED ORDER — ONDANSETRON 4 MG PO TBDP
4.0000 mg | ORAL_TABLET | Freq: Once | ORAL | Status: AC | PRN
  Administered 2017-06-04: 4 mg via ORAL
  Filled 2017-06-04: qty 1

## 2017-06-04 NOTE — ED Notes (Signed)
Pt aware a urine sample is needed.Pt sts he is unable at this time.

## 2017-06-04 NOTE — Telephone Encounter (Signed)
Patient had difficulty walking into the office.  Patient was confused that and thought his appointment was today.  He complained that he has lost 40 pounds in 40 days.  911 was called because the patient can not drive home,  Labs, and further testing would be needed.  Patient verbally agreed.

## 2017-06-04 NOTE — ED Triage Notes (Signed)
Patient is from PCP office via [facility]. Per EMS, he is alert, oriented x 3. However, PCP office would like him to be evaluated due to he came in the office today verses his schedule appointment tomorrow. He is complaining of right leg pain and buttock pain for the last 3 months. Denies any injuries. Also, experiencing nausea with vomiting for the last 3 weeks. Last vomited this morning.

## 2017-06-04 NOTE — Telephone Encounter (Signed)
Agree with note.  Patient was confused with regard to date, president, left shoe on right foot and vice versa.  He himself admitted he was not capable of driving home. He was feeling extremely weak overall. We recommended EMS transfer to hospital for further evaluation

## 2017-06-05 ENCOUNTER — Inpatient Hospital Stay (HOSPITAL_COMMUNITY)
Admission: EM | Admit: 2017-06-05 | Discharge: 2017-06-09 | DRG: 380 | Disposition: A | Discharge status: Home / Self Care | Payer: Self-pay | Attending: Internal Medicine | Admitting: Internal Medicine

## 2017-06-05 ENCOUNTER — Emergency Department (HOSPITAL_COMMUNITY): Payer: Self-pay

## 2017-06-05 ENCOUNTER — Inpatient Hospital Stay (HOSPITAL_COMMUNITY): Payer: Self-pay

## 2017-06-05 DIAGNOSIS — R1310 Dysphagia, unspecified: Secondary | ICD-10-CM

## 2017-06-05 DIAGNOSIS — R410 Disorientation, unspecified: Secondary | ICD-10-CM

## 2017-06-05 DIAGNOSIS — N179 Acute kidney failure, unspecified: Secondary | ICD-10-CM

## 2017-06-05 DIAGNOSIS — G9340 Encephalopathy, unspecified: Secondary | ICD-10-CM | POA: Diagnosis present

## 2017-06-05 DIAGNOSIS — D473 Essential (hemorrhagic) thrombocythemia: Secondary | ICD-10-CM

## 2017-06-05 DIAGNOSIS — R634 Abnormal weight loss: Secondary | ICD-10-CM

## 2017-06-05 DIAGNOSIS — D75839 Thrombocytosis, unspecified: Secondary | ICD-10-CM

## 2017-06-05 DIAGNOSIS — E871 Hypo-osmolality and hyponatremia: Secondary | ICD-10-CM | POA: Diagnosis present

## 2017-06-05 DIAGNOSIS — K222 Esophageal obstruction: Secondary | ICD-10-CM

## 2017-06-05 DIAGNOSIS — K209 Esophagitis, unspecified: Secondary | ICD-10-CM

## 2017-06-05 DIAGNOSIS — I10 Essential (primary) hypertension: Secondary | ICD-10-CM | POA: Diagnosis present

## 2017-06-05 DIAGNOSIS — Z66 Do not resuscitate: Secondary | ICD-10-CM | POA: Diagnosis present

## 2017-06-05 LAB — ETHANOL

## 2017-06-05 LAB — COMPREHENSIVE METABOLIC PANEL
ALBUMIN: 2.9 g/dL — AB (ref 3.5–5.0)
ALK PHOS: 176 U/L — AB (ref 38–126)
ALT: 14 U/L — ABNORMAL LOW (ref 17–63)
ANION GAP: 10 (ref 5–15)
AST: 55 U/L — ABNORMAL HIGH (ref 15–41)
BILIRUBIN TOTAL: 1 mg/dL (ref 0.3–1.2)
BUN: 28 mg/dL — ABNORMAL HIGH (ref 6–20)
CALCIUM: 9.6 mg/dL (ref 8.9–10.3)
CO2: 24 mmol/L (ref 22–32)
Chloride: 99 mmol/L — ABNORMAL LOW (ref 101–111)
Creatinine, Ser: 1.8 mg/dL — ABNORMAL HIGH (ref 0.61–1.24)
GFR calc non Af Amer: 38 mL/min — ABNORMAL LOW (ref >60)
GFR, EST AFRICAN AMERICAN: 44 mL/min — AB (ref >60)
GLUCOSE: 90 mg/dL (ref 65–99)
POTASSIUM: 5.1 mmol/L (ref 3.5–5.1)
SODIUM: 133 mmol/L — AB (ref 135–145)
TOTAL PROTEIN: 6.7 g/dL (ref 6.5–8.1)

## 2017-06-05 LAB — SALICYLATE LEVEL

## 2017-06-05 LAB — RAPID URINE DRUG SCREEN, HOSP PERFORMED
AMPHETAMINES: NOT DETECTED
Barbiturates: NOT DETECTED
Benzodiazepines: POSITIVE — AB
Cocaine: NOT DETECTED
OPIATES: NOT DETECTED
TETRAHYDROCANNABINOL: POSITIVE — AB

## 2017-06-05 LAB — CBC WITH DIFFERENTIAL/PLATELET
BASOS PCT: 1 %
Basophils Absolute: 0.1 10*3/uL (ref 0.0–0.1)
EOS ABS: 0.2 10*3/uL (ref 0.0–0.7)
Eosinophils Relative: 2 %
HCT: 39.9 % (ref 39.0–52.0)
Hemoglobin: 13.5 g/dL (ref 13.0–17.0)
LYMPHS ABS: 2.4 10*3/uL (ref 0.7–4.0)
Lymphocytes Relative: 23 %
MCH: 31.5 pg (ref 26.0–34.0)
MCHC: 33.8 g/dL (ref 30.0–36.0)
MCV: 93.2 fL (ref 78.0–100.0)
MONO ABS: 1.1 10*3/uL — AB (ref 0.1–1.0)
MONOS PCT: 11 %
Neutro Abs: 6.4 10*3/uL (ref 1.7–7.7)
Neutrophils Relative %: 63 %
Platelets: 445 10*3/uL — ABNORMAL HIGH (ref 150–400)
RBC: 4.28 MIL/uL (ref 4.22–5.81)
RDW: 13.3 % (ref 11.5–15.5)
WBC: 10.2 10*3/uL (ref 4.0–10.5)

## 2017-06-05 LAB — URINALYSIS, ROUTINE W REFLEX MICROSCOPIC
Bilirubin Urine: NEGATIVE
GLUCOSE, UA: NEGATIVE mg/dL
Hgb urine dipstick: NEGATIVE
KETONES UR: NEGATIVE mg/dL
LEUKOCYTES UA: NEGATIVE
Nitrite: NEGATIVE
PROTEIN: NEGATIVE mg/dL
Specific Gravity, Urine: 1.018 (ref 1.005–1.030)
pH: 5 (ref 5.0–8.0)

## 2017-06-05 LAB — I-STAT TROPONIN, ED: Troponin i, poc: 0 ng/mL (ref 0.00–0.08)

## 2017-06-05 LAB — TSH: TSH: 0.909 u[IU]/mL (ref 0.350–4.500)

## 2017-06-05 LAB — C-REACTIVE PROTEIN

## 2017-06-05 LAB — ACETAMINOPHEN LEVEL: Acetaminophen (Tylenol), Serum: <10 ug/mL — ABNORMAL LOW (ref 10–30)

## 2017-06-05 LAB — SEDIMENTATION RATE: Sed Rate: 58 mm/hr — ABNORMAL HIGH (ref 0–16)

## 2017-06-05 LAB — AMMONIA: AMMONIA: 34 umol/L (ref 9–35)

## 2017-06-05 LAB — VITAMIN B12: VITAMIN B 12: 383 pg/mL (ref 180–914)

## 2017-06-05 LAB — MAGNESIUM: Magnesium: 1.9 mg/dL (ref 1.7–2.4)

## 2017-06-05 MED ORDER — FLUCONAZOLE 100MG IVPB
100.0000 mg | INTRAVENOUS | Status: DC
  Administered 2017-06-06: 100 mg via INTRAVENOUS
  Filled 2017-06-05: qty 50

## 2017-06-05 MED ORDER — GERHARDT'S BUTT CREAM
1.0000 "application " | TOPICAL_CREAM | Freq: Three times a day (TID) | CUTANEOUS | Status: DC
  Administered 2017-06-06 – 2017-06-08 (×3): 1 via TOPICAL
  Filled 2017-06-05 (×2): qty 1

## 2017-06-05 MED ORDER — POLYETHYLENE GLYCOL 3350 17 G PO PACK
17.0000 g | PACK | Freq: Every day | ORAL | Status: DC | PRN
  Administered 2017-06-07: 17 g via ORAL
  Filled 2017-06-05 (×2): qty 1

## 2017-06-05 MED ORDER — ALBUTEROL SULFATE HFA 108 (90 BASE) MCG/ACT IN AERS
2.0000 | INHALATION_SPRAY | RESPIRATORY_TRACT | Status: DC | PRN
  Filled 2017-06-05: qty 6.7

## 2017-06-05 MED ORDER — ONDANSETRON HCL 4 MG PO TABS: 4.0000 mg | ORAL_TABLET | Freq: Four times a day (QID) | ORAL | Status: DC | PRN

## 2017-06-05 MED ORDER — SODIUM CHLORIDE 0.9 % IV BOLUS (SEPSIS)
1000.0000 mL | Freq: Once | INTRAVENOUS | Status: AC
  Administered 2017-06-05: 1000 mL via INTRAVENOUS

## 2017-06-05 MED ORDER — THIAMINE HCL 100 MG/ML IJ SOLN: 100.0000 mg | Freq: Every day | INTRAMUSCULAR | Status: DC

## 2017-06-05 MED ORDER — ONDANSETRON HCL 4 MG/2ML IJ SOLN: 4.0000 mg | Freq: Four times a day (QID) | INTRAMUSCULAR | Status: DC | PRN

## 2017-06-05 MED ORDER — SODIUM CHLORIDE 0.9 % IV SOLN
INTRAVENOUS | Status: DC
  Administered 2017-06-05: 06:00:00 via INTRAVENOUS

## 2017-06-05 MED ORDER — ACETAMINOPHEN 325 MG PO TABS: 650.0000 mg | ORAL_TABLET | Freq: Four times a day (QID) | ORAL | Status: DC | PRN

## 2017-06-05 MED ORDER — NICOTINE 21 MG/24HR TD PT24
21.0000 mg | MEDICATED_PATCH | Freq: Every day | TRANSDERMAL | Status: DC
  Administered 2017-06-05 – 2017-06-09 (×4): 21 mg via TRANSDERMAL
  Filled 2017-06-05 (×5): qty 1

## 2017-06-05 MED ORDER — PANTOPRAZOLE SODIUM 40 MG IV SOLR
40.0000 mg | Freq: Two times a day (BID) | INTRAVENOUS | Status: DC
  Administered 2017-06-05 (×2): 40 mg via INTRAVENOUS
  Filled 2017-06-05 (×3): qty 40

## 2017-06-05 MED ORDER — VITAMIN B-1 100 MG PO TABS
100.0000 mg | ORAL_TABLET | Freq: Every day | ORAL | Status: DC
  Administered 2017-06-07 – 2017-06-09 (×3): 100 mg via ORAL
  Filled 2017-06-05 (×3): qty 1

## 2017-06-05 MED ORDER — FLUCONAZOLE IN SODIUM CHLORIDE 200-0.9 MG/100ML-% IV SOLN
200.0000 mg | INTRAVENOUS | Status: AC
  Administered 2017-06-05: 200 mg via INTRAVENOUS
  Filled 2017-06-05: qty 100

## 2017-06-05 MED ORDER — SODIUM CHLORIDE 0.9 % IV BOLUS (SEPSIS): 500.0000 mL | Freq: Once | INTRAVENOUS | Status: DC

## 2017-06-05 MED ORDER — SODIUM POLYSTYRENE SULFONATE PO POWD
15.0000 g | Freq: Once | ORAL | Status: DC
  Filled 2017-06-05: qty 15

## 2017-06-05 MED ORDER — FOLIC ACID 1 MG PO TABS
1.0000 mg | ORAL_TABLET | Freq: Every day | ORAL | Status: DC
  Administered 2017-06-07 – 2017-06-09 (×3): 1 mg via ORAL
  Filled 2017-06-05 (×3): qty 1

## 2017-06-05 MED ORDER — LORAZEPAM 1 MG PO TABS
0.0000 mg | ORAL_TABLET | Freq: Four times a day (QID) | ORAL | Status: DC
  Administered 2017-06-05: 2 mg via ORAL
  Filled 2017-06-05: qty 2

## 2017-06-05 MED ORDER — LORAZEPAM 2 MG/ML IJ SOLN
1.0000 mg | Freq: Four times a day (QID) | INTRAMUSCULAR | Status: DC | PRN
  Administered 2017-06-06 (×2): 1 mg via INTRAVENOUS
  Filled 2017-06-05 (×2): qty 1

## 2017-06-05 MED ORDER — LORAZEPAM 1 MG PO TABS: 1.0000 mg | ORAL_TABLET | Freq: Four times a day (QID) | ORAL | Status: DC | PRN

## 2017-06-05 MED ORDER — ALBUTEROL SULFATE (2.5 MG/3ML) 0.083% IN NEBU: 2.5000 mg | INHALATION_SOLUTION | RESPIRATORY_TRACT | Status: DC | PRN

## 2017-06-05 MED ORDER — ACETAMINOPHEN 650 MG RE SUPP: 650.0000 mg | Freq: Four times a day (QID) | RECTAL | Status: DC | PRN

## 2017-06-05 MED ORDER — THIAMINE HCL 100 MG/ML IJ SOLN
100.0000 mg | Freq: Once | INTRAMUSCULAR | Status: AC
  Administered 2017-06-05: 100 mg via INTRAVENOUS
  Filled 2017-06-05: qty 2

## 2017-06-05 MED ORDER — ADULT MULTIVITAMIN W/MINERALS CH
1.0000 | ORAL_TABLET | Freq: Every day | ORAL | Status: DC
  Administered 2017-06-07: 1 via ORAL
  Filled 2017-06-05 (×2): qty 1

## 2017-06-05 MED ORDER — TRAZODONE HCL 50 MG PO TABS: 50.0000 mg | ORAL_TABLET | Freq: Every day | ORAL | Status: DC

## 2017-06-05 MED ORDER — ENSURE ENLIVE PO LIQD: 237.0000 mL | Freq: Two times a day (BID) | ORAL | Status: DC

## 2017-06-05 MED ORDER — SUCRALFATE 1 GM/10ML PO SUSP
1.0000 g | Freq: Three times a day (TID) | ORAL | Status: DC
  Administered 2017-06-05 – 2017-06-09 (×12): 1 g via ORAL
  Filled 2017-06-05 (×13): qty 10

## 2017-06-05 MED ORDER — LORAZEPAM 1 MG PO TABS: 0.0000 mg | ORAL_TABLET | Freq: Two times a day (BID) | ORAL | Status: DC

## 2017-06-05 MED ORDER — DEXTROSE-NACL 5-0.45 % IV SOLN
INTRAVENOUS | Status: DC
  Administered 2017-06-05 – 2017-06-06 (×3): via INTRAVENOUS

## 2017-06-05 MED ORDER — ENSURE ENLIVE PO LIQD
237.0000 mL | Freq: Two times a day (BID) | ORAL | Status: DC
  Filled 2017-06-05: qty 237

## 2017-06-05 MED ORDER — TRAZODONE HCL 50 MG PO TABS
50.0000 mg | ORAL_TABLET | Freq: Every day | ORAL | Status: DC
  Administered 2017-06-05 – 2017-06-08 (×4): 50 mg via ORAL
  Filled 2017-06-05 (×4): qty 1

## 2017-06-05 MED ORDER — MAGNESIUM OXIDE 400 (241.3 MG) MG PO TABS
400.0000 mg | ORAL_TABLET | Freq: Every day | ORAL | Status: DC
  Administered 2017-06-07 – 2017-06-09 (×3): 400 mg via ORAL
  Filled 2017-06-05 (×4): qty 1

## 2017-06-05 NOTE — Consult Note (Signed)
Referring Provider: [facility]   Primary Care Physician:  [name], MD Primary Gastroenterologist: Lucio Edward,  MD  Reason for Consultation:  Odynophagia / weight loss   ASSESSMENT AND PLAN:    67 yo male with severe odynophagia / significant weight loss. He has known severe esophagitis (last EGD Sept 2018). His odynophagia has not improved with PPI / carafate.  -Needs repeat EGD to reassess esophagitis and look for new etiologies of severe odynophagia (fungal, viral ?). The risks and benefits of EGD were discussed and the patient agrees to proceed. Will plan for procedure to be done tomorrow. Will give clear liquids today. -he is getting Diflucan, I assume empirically for monilial esophagitis.  -continue BID IV PPI -continue carafate.   HPI: Patrick [name] is a 67 y.o. male with a history of alcohol abuse and GERD with severe esophagitis.  He was hospitalized in 2016 for hematemesis, inpatient EGD showed severe esophagitis and a 5 cm hiatal hernia.  Patient was rehospitalized September 2018 with acute on chronic anemia.  Repeat EGD revealed severe esophagitis and a 10 cm hiatal hernia.  Hospitalized again early January of this year with recurrent severe anemia and failure to thrive. No active GI bleeding at that time so. EGD not repeated. He responded to blood transfusions.  I saw Patrick [name] for an office follow-up 05/23/2017, please refer to that dictation.  Basically at the time I saw patient he was complaining of severe odynophagia and had lost a significant amount of weight.  He was tolerating only milk shakes and yogurt.  Patient was taking PPI only once daily and was not taking the correct amount of Carafate either .  I recommended a wedge pillow, antireflux measures, 4 times daily Carafate and twice daily PPI.  Plan was to reevaluate him in a couple of weeks and if no improvement, probable repeat EGD.   Patrick [name] presented to the emergency department via EMS yesterday.   He was taken from his PCPs office were patient showed up confused and weak .  Patrick [name] has been taking twice daily PPI.  Sounds like he is taking Carafate at least 3 times a day.  He is still having significant pain with swallowing, vomiting mucus. Living off of yogurt and ice cream. Denies overt GI bleeding.  Says he has had no alcohol in a month.  Evaluation: Head CT negative for acute abnormalities.  He has AK I with creatinine of 1.8, serum potassium 5.1.  Albumin 2.9, liver labs otherwise normal.  Ammonia normal.  WBC normal.  Hemoglobin normal.  Alcohol and salicylate levels normal.   Past Medical History:  Diagnosis Date  . Colon polyps   . Depression   . Diverticulitis   . Diverticulosis of colon   . GERD (gastroesophageal reflux disease)   . Kidney stone     Past Surgical History:  Procedure Laterality Date  . COLONOSCOPY    . ESOPHAGOGASTRODUODENOSCOPY (EGD) WITH PROPOFOL N/A 12/21/2016   Procedure: ESOPHAGOGASTRODUODENOSCOPY (EGD) WITH PROPOFOL;  Surgeon: Doran Stabler, MD;  Location: [facility];  Service: Gastroenterology;  Laterality: N/A;  . UPPER GASTROINTESTINAL ENDOSCOPY      Prior to Admission medications   Medication Sig Start Date End Date Taking? Authorizing Provider  albuterol (VENTOLIN HFA) 108 (90 BASE) MCG/ACT inhaler Inhale 2 puffs into the lungs every 4 (four) hours as needed for wheezing or shortness of breath. 08/17/14  Yes Burchette, Alinda Sierras, MD  magnesium oxide (MAG-OX) 400 (241.3 Mg)  MG tablet Take 1 tablet (400 mg total) by mouth daily. 04/28/17  Yes Theodis Blaze, MD  nicotine (NICODERM CQ - DOSED IN MG/24 HOURS) 21 mg/24hr patch Place 1 patch (21 mg total) onto the skin daily. 12/02/16  Yes Short, Noah Delaine, MD  pantoprazole (PROTONIX) 40 MG tablet Take 1 tablet (40 mg total) by mouth 2 (two) times daily. 05/23/17  Yes Willia Craze, NP  sucralfate (CARAFATE) 1 GM/10ML suspension Take 10 mLs (1 g total) by mouth 4 (four) times daily -  with meals  and at bedtime. 04/27/17  Yes Theodis Blaze, MD  traZODone (DESYREL) 50 MG tablet Take 1 tablet (50 mg total) by mouth at bedtime. 05/14/17  Yes Burchette, Alinda Sierras, MD  Emollient (BOUDREAUXS BABY BUTT SMOOTH) OINT Apply to topically three times a day Patient not taking: Reported on 05/23/2017 05/08/17   [name], MD  feeding supplement, ENSURE ENLIVE, (ENSURE ENLIVE) LIQD Take 237 mLs by mouth 2 (two) times daily between meals. Patient not taking: Reported on 05/23/2017 12/01/16   Janece Canterbury, MD  ferrous sulfate 325 (65 FE) MG tablet Take 1 tablet (325 mg total) by mouth 3 (three) times daily with meals. Patient not taking: Reported on 06/05/2017 12/01/16   Janece Canterbury, MD  fluconazole (DIFLUCAN) 100 MG tablet Take 1 tablet (100 mg total) by mouth daily. Patient not taking: Reported on 05/23/2017 05/04/17   [name], MD  folic acid (FOLVITE) 1 MG tablet Take 1 tablet (1 mg total) by mouth daily. Patient not taking: Reported on 06/05/2017 12/02/16   Janece Canterbury, MD  HYDROcodone-acetaminophen (NORCO/VICODIN) 5-325 MG tablet Take 1-2 tablets by mouth every 4 (four) hours as needed for moderate pain or severe pain. Patient not taking: Reported on 05/23/2017 04/27/17   Theodis Blaze, MD  Hydrocortisone (GERHARDT'S BUTT CREAM) CREA Apply 1 application topically 3 (three) times daily. Patient not taking: Reported on 05/23/2017 04/27/17   Theodis Blaze, MD  thiamine 100 MG tablet Take 1 tablet (100 mg total) by mouth daily. Patient not taking: Reported on 06/05/2017 12/02/16   Janece Canterbury, MD    Current Facility-Administered Medications  Medication Dose Route Frequency Provider Last Rate Last Dose  . acetaminophen (TYLENOL) tablet 650 mg  650 mg Oral Q6H PRN Charlynne Cousins, MD       Or  . acetaminophen (TYLENOL) suppository 650 mg  650 mg Rectal Q6H PRN Charlynne Cousins, MD      . albuterol (PROVENTIL HFA;VENTOLIN HFA) 108 (90 Base) MCG/ACT inhaler 2 puff  2 puff  Inhalation Q4H PRN Charlynne Cousins, MD      . dextrose 5 %-0.45 % sodium chloride infusion   Intravenous Continuous Charlynne Cousins, MD      . feeding supplement (ENSURE ENLIVE) (ENSURE ENLIVE) liquid 237 mL  237 mL Oral BID BM Charlynne Cousins, MD      . feeding supplement (ENSURE ENLIVE) (ENSURE ENLIVE) liquid 237 mL  237 mL Oral BID BM Charlynne Cousins, MD      . Derrill Memo ON 06/06/2017] fluconazole (DIFLUCAN) IVPB 100 mg  100 mg Intravenous Q24H Charlynne Cousins, MD      . fluconazole (DIFLUCAN) IVPB 200 mg  200 mg Intravenous Q24H Charlynne Cousins, MD      . folic acid (FOLVITE) tablet 1 mg  1 mg Oral Daily Charlynne Cousins, MD      . Gerhardt's butt cream 1 application  1 application Topical TID  Charlynne Cousins, MD      . LORazepam (ATIVAN) tablet 1 mg  1 mg Oral Q6H PRN Charlynne Cousins, MD       Or  . LORazepam (ATIVAN) injection 1 mg  1 mg Intravenous Q6H PRN Charlynne Cousins, MD      . LORazepam (ATIVAN) tablet 0-4 mg  0-4 mg Oral Q6H Charlynne Cousins, MD       Followed by  . [START ON 06/07/2017] LORazepam (ATIVAN) tablet 0-4 mg  0-4 mg Oral Q12H Charlynne Cousins, MD      . magnesium oxide (MAG-OX) tablet 400 mg  400 mg Oral Daily Charlynne Cousins, MD      . multivitamin with minerals tablet 1 tablet  1 tablet Oral Daily Charlynne Cousins, MD      . nicotine (NICODERM CQ - dosed in mg/24 hours) patch 21 mg  21 mg Transdermal Daily Charlynne Cousins, MD      . ondansetron [facility]) tablet 4 mg  4 mg Oral Q6H PRN Charlynne Cousins, MD       Or  . ondansetron [facility]) injection 4 mg  4 mg Intravenous Q6H PRN Charlynne Cousins, MD      . pantoprazole (PROTONIX) injection 40 mg  40 mg Intravenous Q12H Charlynne Cousins, MD      . polyethylene glycol [facility] Floria Raveling) packet 17 g  17 g Oral Daily PRN Charlynne Cousins, MD      . sodium chloride 0.9 % bolus 1,000 mL  1,000 mL Intravenous Once Aileen Fass, Tammi Klippel, MD        . sodium chloride 0.9 % bolus 500 mL  500 mL Intravenous Once Aileen Fass, Tammi Klippel, MD      . sodium polystyrene (KAYEXALATE) powder 15 g  15 g Oral Once Aileen Fass, Tammi Klippel, MD      . sucralfate (CARAFATE) 1 GM/10ML suspension 1 g  1 g Oral TID WC & HS Charlynne Cousins, MD      . thiamine (VITAMIN B-1) tablet 100 mg  100 mg Oral Daily Charlynne Cousins, MD       Or  . thiamine (B-1) injection 100 mg  100 mg Intravenous Daily Charlynne Cousins, MD      . traZODone (DESYREL) tablet 50 mg  50 mg Oral QHS Joy, Shawn C, PA-C   50 mg at 06/05/17 5643   Current Outpatient Medications  Medication Sig Dispense Refill  . albuterol (VENTOLIN HFA) 108 (90 BASE) MCG/ACT inhaler Inhale 2 puffs into the lungs every 4 (four) hours as needed for wheezing or shortness of breath. 1 Inhaler 1  . magnesium oxide (MAG-OX) 400 (241.3 Mg) MG tablet Take 1 tablet (400 mg total) by mouth daily. 30 tablet 1  . nicotine (NICODERM CQ - DOSED IN MG/24 HOURS) 21 mg/24hr patch Place 1 patch (21 mg total) onto the skin daily. 28 patch 0  . pantoprazole (PROTONIX) 40 MG tablet Take 1 tablet (40 mg total) by mouth 2 (two) times daily. 60 tablet 3  . sucralfate (CARAFATE) 1 GM/10ML suspension Take 10 mLs (1 g total) by mouth 4 (four) times daily -  with meals and at bedtime. 420 mL 0  . traZODone (DESYREL) 50 MG tablet Take 1 tablet (50 mg total) by mouth at bedtime. 30 tablet 2  . Emollient (BOUDREAUXS BABY BUTT SMOOTH) OINT Apply to topically three times a day (Patient not taking: Reported on 05/23/2017) 85 g 0  .  feeding supplement, ENSURE ENLIVE, (ENSURE ENLIVE) LIQD Take 237 mLs by mouth 2 (two) times daily between meals. (Patient not taking: Reported on 05/23/2017) 60 Bottle 0  . ferrous sulfate 325 (65 FE) MG tablet Take 1 tablet (325 mg total) by mouth 3 (three) times daily with meals. (Patient not taking: Reported on 06/05/2017) 90 tablet 0  . fluconazole (DIFLUCAN) 100 MG tablet Take 1 tablet (100 mg total) by  mouth daily. (Patient not taking: Reported on 05/23/2017) 10 tablet 0  . folic acid (FOLVITE) 1 MG tablet Take 1 tablet (1 mg total) by mouth daily. (Patient not taking: Reported on 06/05/2017) 30 tablet 0  . HYDROcodone-acetaminophen (NORCO/VICODIN) 5-325 MG tablet Take 1-2 tablets by mouth every 4 (four) hours as needed for moderate pain or severe pain. (Patient not taking: Reported on 05/23/2017) 30 tablet 0  . Hydrocortisone (GERHARDT'S BUTT CREAM) CREA Apply 1 application topically 3 (three) times daily. (Patient not taking: Reported on 05/23/2017) 1 each 5  . thiamine 100 MG tablet Take 1 tablet (100 mg total) by mouth daily. (Patient not taking: Reported on 06/05/2017) 30 tablet 0    Allergies as of 06/04/2017  . (No Known Allergies)    Family History  Problem Relation Age of Onset  . Heart disease Father 75       cabg    Social History   Socioeconomic History  . Marital status: Divorced    Spouse name: Not on file  . Number of children: Not on file  . Years of education: Not on file  . Highest education level: Not on file  Social Needs  . Financial resource strain: Not on file  . Food insecurity - worry: Not on file  . Food insecurity - inability: Not on file  . Transportation needs - medical: Not on file  . Transportation needs - non-medical: Not on file  Occupational History  . Not on file  Tobacco Use  . Smoking status: Current Every Day Smoker    Packs/day: 1.00    Types: Cigarettes  . Smokeless tobacco: Never Used  Substance and Sexual Activity  . Alcohol use: Yes    Alcohol/week: 8.4 oz    Types: 14 Glasses of wine per week  . Drug use: No  . Sexual activity: No  Other Topics Concern  . Not on file  Social History Narrative  . Not on file    Review of Systems: Positive for right leg / right buttock pain. All other systems reviewed and negative except where noted in HPI.  Physical Exam: Vital signs in last 24 hours: Temp:  [97.4 F (36.3 C)-97.6 F  (36.4 C)] 97.4 F (36.3 C) (02/26 0458) Pulse Rate:  [91-110] 99 (02/26 0944) Resp:  [14-20] 20 (02/26 0944) BP: (93-128)/(69-89) 117/76 (02/26 0944) SpO2:  [92 %-100 %] 100 % (02/26 0944)   General:   Alert, thin chronically ill appearing white male in NAD Psych:  Pleasant, cooperative. Normal mood and affect. Eyes:  Pupils equal, sclera clear, no icterus.   Conjunctiva pink. Ears:  Normal auditory acuity. Nose:  No deformity, discharge,  or lesions. Neck:  Supple; no masses Lungs:  Clear throughout to auscultation.  Heart:  Regular rate and rhythm; no murmurs, no edema Abdomen:  Soft, non-distended, nontender, BS active, no palp mass    Rectal:  Deferred  Msk:  Symmetrical without gross deformities. . Neurologic:  Alert and  oriented x4;  grossly normal neurologically. Skin:  Intact without significant lesions or rashes.Marland Kitchen  Intake/Output from previous day: 02/25 0701 - 02/26 0700 In: 1000 [IV Piggyback:1000] Out: -  Intake/Output this shift: No intake/output data recorded.  Lab Results: Recent Labs    06/04/17 1629 06/05/17 0217  WBC 9.8 10.2  HGB 13.8 13.5  HCT 40.8 39.9  PLT 470* 445*   BMET Recent Labs    06/04/17 1629 06/05/17 0217  NA 134* 133*  K 4.0 5.1  CL 94* 99*  CO2 26 24  GLUCOSE 116* 90  BUN 28* 28*  CREATININE 1.70* 1.80*  CALCIUM 10.5* 9.6   LFT Recent Labs    06/05/17 0217  PROT 6.7  ALBUMIN 2.9*  AST 55*  ALT 14*  ALKPHOS 176*  BILITOT 1.0    Studies/Results: Dg Chest 2 View  Result Date: 06/05/2017 CLINICAL DATA:  68 y/o M; confusion, dehydration, and difficulty walking. Altered mental status. EXAM: CHEST  2 VIEW COMPARISON:  04/20/2017 chest radiograph.  11/29/2016 CT chest. FINDINGS: Normal cardiac silhouette. Aortic atherosclerosis with calcification. Small to moderate hiatal hernia. Emphysema hyperinflated lungs compatible with COPD. No focal consolidation. No pleural effusion or pneumothorax. No acute osseous abnormality is  evident. IMPRESSION: Small to moderate hiatal hernia. COPD. No acute pulmonary process identified. Electronically Signed   By: Kristine Garbe M.D.   On: 06/05/2017 02:06   Ct Head Wo Contrast  Result Date: 06/05/2017 CLINICAL DATA:  67 y/o  M; confusion and weakness. EXAM: CT HEAD WITHOUT CONTRAST TECHNIQUE: Contiguous axial images were obtained from the base of the skull through the vertex without intravenous contrast. COMPARISON:  None. FINDINGS: Brain: No evidence of acute infarction, hemorrhage, hydrocephalus, extra-axial collection or mass lesion/mass effect. Scattered nonspecific foci of hypoattenuation in subcortical and periventricular white matter compatible with moderate chronic microvascular ischemic changes for age and there is moderate frontal parietal predominance brain parenchymal volume loss. Vascular: Mild calcific atherosclerosis of carotid siphons. No hyperdense vessel. Skull: Normal. Negative for fracture or focal lesion. Sinuses/Orbits: No acute finding. Other: None. IMPRESSION: 1. No acute intracranial abnormality identified. 2. Moderate for age chronic microvascular ischemic changes. Moderate brain atrophy with frontal and parietal predominance. Electronically Signed   By: Kristine Garbe M.D.   On: 06/05/2017 02:03   Mr Brain Wo Contrast  Result Date: 06/05/2017 CLINICAL DATA:  67 year old male with confusion and altered mental status since yesterday. EXAM: MRI HEAD WITHOUT CONTRAST TECHNIQUE: Multiplanar, multiecho pulse sequences of the brain and surrounding structures were obtained without intravenous contrast. COMPARISON:  Head CT without contrast 0151 hours today. FINDINGS: Brain: No restricted diffusion to suggest acute infarction. No midline shift, mass effect, evidence of mass lesion, ventriculomegaly, extra-axial collection or acute intracranial hemorrhage. Cervicomedullary junction and pituitary are within normal limits. Cerebral volume loss over that  expected for age. There seems to be disproportionate atrophy of the frontal and parietal lobes (series 10, image 14). No cortical encephalomalacia identified. No chronic cerebral blood products. Mild to moderate for age patchy periventricular, splenium corpus callosum, and pontine nonspecific T2 and FLAIR hyperintensity. The deep gray matter nuclei and cerebellum appear within normal limits for age. Vascular: Major intracranial vascular flow voids are preserved. Skull and upper cervical spine: Normal visible cervical spine. Normal bone marrow signal. Sinuses/Orbits: Postoperative changes to both globes, otherwise normal orbits soft tissues. Paranasal sinuses are clear. Other: Visible internal auditory structures appear normal. Mastoids are clear. Scalp and face soft tissues appear negative. IMPRESSION: 1.  No acute intracranial abnormality. 2. Cerebral volume loss with disproportionate bilateral frontoparietal involvement. 3. Mild to moderate for  age nonspecific signal changes in the cerebral white matter and pons, most commonly due to chronic small vessel disease. Electronically Signed   By: Genevie Ann M.D.   On: 06/05/2017 07:16   Dg Hip Unilat W Or Wo Pelvis 2-3 Views Right  Result Date: 06/05/2017 CLINICAL DATA:  67 year old male with right hip pain. EXAM: DG HIP (WITH OR WITHOUT PELVIS) 2-3V RIGHT; RIGHT FEMUR 2 VIEWS COMPARISON:  Abdominal CT dated 01/04/2017. FINDINGS: There is no acute fracture or dislocation. 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 There is narrowing of the inferior aspect of the right femoroacetabular joint space. The bones are osteopenic. The soft tissues appear unremarkable. IMPRESSION: 1. No acute fracture or dislocation. 2. Osteopenia and osteoarthritic changes of the hips. Electronically Signed   By: Anner Crete M.D.   On: 06/05/2017 02:02   Dg Femur Min 2 Views Right  Result Date: 06/05/2017 CLINICAL DATA:  67 year old male with right hip  pain. EXAM: DG HIP (WITH OR WITHOUT PELVIS) 2-3V RIGHT; RIGHT FEMUR 2 VIEWS COMPARISON:  Abdominal CT dated 01/04/2017. FINDINGS: There is no acute fracture or dislocation. 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 There is narrowing of the inferior aspect of the right femoroacetabular joint space. The bones are osteopenic. The soft tissues appear unremarkable. IMPRESSION: 1. No acute fracture or dislocation. 2. Osteopenia and osteoarthritic changes of the hips. Electronically Signed   By: Anner Crete M.D.   On: 06/05/2017 02:02     Tye Savoy, NP-C @  06/05/2017, 11:23 AM  Pager number [phone]

## 2017-06-05 NOTE — ED Notes (Signed)
Bed: WA33 Expected date:  Expected time:  Means of arrival:  Comments: Hall B 

## 2017-06-05 NOTE — ED Notes (Signed)
PT ambulate to and from restroom with one assist. PT unable to provide urine sample at this time

## 2017-06-05 NOTE — Plan of Care (Signed)
Received signout from [place], [place]. Patrick [name] is a 67 year old male with pmh of alcohol abuse and GERD; who presents after being found to be altered.  Went to PCP office at wrong time with issues on backwards.  Patient only oriented to person and place.  Reporting complaints of right leg pain, emesis, and weight loss of 40 pounds possibly.  Reports no drink in several weeks.  Vital signs: Temperature afebrile, pulse 94-110, respirations 14-18, blood pressure 93/69 to 101/78, O2 saturations 92-100% on room air.  Labs revealed WBC 9.8, hemoglobin 13.8, platelets 470, BUN 28, creatinine 1.7, calcium 10.5, alkaline phosphatase 1-16, ammonia 34.  In and out cath to be performed and urinalysis pending.  Patient had been given 1 L of normal saline IV fluids, Zofran, 50 mg of trazodone, and 100 mg of thiamine IV. Admit to a telemetry bed inpatient for evaluation for acute encephalopathy, acute kidney injury, and dehydration.

## 2017-06-05 NOTE — ED Notes (Signed)
Pharmacy to send morning meds.

## 2017-06-05 NOTE — ED Notes (Signed)
Pt not able to tolerate breakfast.  Continues to cough when he tries to eat.  States he is hungry but just can't eat.

## 2017-06-05 NOTE — H&P (View-Only) (Signed)
Referring Provider: [facility]   Primary Care Physician:  [name], MD Primary Gastroenterologist: Lucio Edward,  MD  Reason for Consultation:  Odynophagia / weight loss   ASSESSMENT AND PLAN:    67 yo male with severe odynophagia / significant weight loss. He has known severe esophagitis (last EGD Sept 2018). His odynophagia has not improved with PPI / carafate.  -Needs repeat EGD to reassess esophagitis and look for new etiologies of severe odynophagia (fungal, viral ?). The risks and benefits of EGD were discussed and the patient agrees to proceed. Will plan for procedure to be done tomorrow. Will give clear liquids today. -he is getting Diflucan, I assume empirically for monilial esophagitis.  -continue BID IV PPI -continue carafate.   HPI: Patrick [name] is a 67 y.o. male with a history of alcohol abuse and GERD with severe esophagitis.  He was hospitalized in 2016 for hematemesis, inpatient EGD showed severe esophagitis and a 5 cm hiatal hernia.  Patient was rehospitalized September 2018 with acute on chronic anemia.  Repeat EGD revealed severe esophagitis and a 10 cm hiatal hernia.  Hospitalized again early January of this year with recurrent severe anemia and failure to thrive. No active GI bleeding at that time so. EGD not repeated. He responded to blood transfusions.  I saw [name] for an office follow-up 05/23/2017, please refer to that dictation.  Basically at the time I saw patient he was complaining of severe odynophagia and had lost a significant amount of weight.  He was tolerating only milk shakes and yogurt.  Patient was taking PPI only once daily and was not taking the correct amount of Carafate either .  I recommended a wedge pillow, antireflux measures, 4 times daily Carafate and twice daily PPI.  Plan was to reevaluate him in a couple of weeks and if no improvement, probable repeat EGD.   [name] presented to the emergency department via EMS yesterday.   He was taken from his PCPs office were patient showed up confused and weak .  Mr. Delone has been taking twice daily PPI.  Sounds like he is taking Carafate at least 3 times a day.  He is still having significant pain with swallowing, vomiting mucus. Living off of yogurt and ice cream. Denies overt GI bleeding.  Says he has had no alcohol in a month.  Evaluation: Head CT negative for acute abnormalities.  He has AK I with creatinine of 1.8, serum potassium 5.1.  Albumin 2.9, liver labs otherwise normal.  Ammonia normal.  WBC normal.  Hemoglobin normal.  Alcohol and salicylate levels normal.   Past Medical History:  Diagnosis Date  . Colon polyps   . Depression   . Diverticulitis   . Diverticulosis of colon   . GERD (gastroesophageal reflux disease)   . Kidney stone     Past Surgical History:  Procedure Laterality Date  . COLONOSCOPY    . ESOPHAGOGASTRODUODENOSCOPY (EGD) WITH PROPOFOL N/A 12/21/2016   Procedure: ESOPHAGOGASTRODUODENOSCOPY (EGD) WITH PROPOFOL;  Surgeon: Doran Stabler, MD;  Location: [facility];  Service: Gastroenterology;  Laterality: N/A;  . UPPER GASTROINTESTINAL ENDOSCOPY      Prior to Admission medications   Medication Sig Start Date End Date Taking? Authorizing Provider  albuterol (VENTOLIN HFA) 108 (90 BASE) MCG/ACT inhaler Inhale 2 puffs into the lungs every 4 (four) hours as needed for wheezing or shortness of breath. 08/17/14  Yes Burchette, Alinda Sierras, MD  magnesium oxide (MAG-OX) 400 (241.3 Mg)  MG tablet Take 1 tablet (400 mg total) by mouth daily. 04/28/17  Yes Theodis Blaze, MD  nicotine (NICODERM CQ - DOSED IN MG/24 HOURS) 21 mg/24hr patch Place 1 patch (21 mg total) onto the skin daily. 12/02/16  Yes Short, Noah Delaine, MD  pantoprazole (PROTONIX) 40 MG tablet Take 1 tablet (40 mg total) by mouth 2 (two) times daily. 05/23/17  Yes Willia Craze, NP  sucralfate (CARAFATE) 1 GM/10ML suspension Take 10 mLs (1 g total) by mouth 4 (four) times daily -  with meals  and at bedtime. 04/27/17  Yes Theodis Blaze, MD  traZODone (DESYREL) 50 MG tablet Take 1 tablet (50 mg total) by mouth at bedtime. 05/14/17  Yes Burchette, Alinda Sierras, MD  Emollient (BOUDREAUXS BABY BUTT SMOOTH) OINT Apply to topically three times a day Patient not taking: Reported on 05/23/2017 05/08/17   [name], MD  feeding supplement, ENSURE ENLIVE, (ENSURE ENLIVE) LIQD Take 237 mLs by mouth 2 (two) times daily between meals. Patient not taking: Reported on 05/23/2017 12/01/16   Janece Canterbury, MD  ferrous sulfate 325 (65 FE) MG tablet Take 1 tablet (325 mg total) by mouth 3 (three) times daily with meals. Patient not taking: Reported on 06/05/2017 12/01/16   Janece Canterbury, MD  fluconazole (DIFLUCAN) 100 MG tablet Take 1 tablet (100 mg total) by mouth daily. Patient not taking: Reported on 05/23/2017 05/04/17   [name], MD  folic acid (FOLVITE) 1 MG tablet Take 1 tablet (1 mg total) by mouth daily. Patient not taking: Reported on 06/05/2017 12/02/16   Janece Canterbury, MD  HYDROcodone-acetaminophen (NORCO/VICODIN) 5-325 MG tablet Take 1-2 tablets by mouth every 4 (four) hours as needed for moderate pain or severe pain. Patient not taking: Reported on 05/23/2017 04/27/17   Theodis Blaze, MD  Hydrocortisone (GERHARDT'S BUTT CREAM) CREA Apply 1 application topically 3 (three) times daily. Patient not taking: Reported on 05/23/2017 04/27/17   Theodis Blaze, MD  thiamine 100 MG tablet Take 1 tablet (100 mg total) by mouth daily. Patient not taking: Reported on 06/05/2017 12/02/16   Janece Canterbury, MD    Current Facility-Administered Medications  Medication Dose Route Frequency Provider Last Rate Last Dose  . acetaminophen (TYLENOL) tablet 650 mg  650 mg Oral Q6H PRN Charlynne Cousins, MD       Or  . acetaminophen (TYLENOL) suppository 650 mg  650 mg Rectal Q6H PRN Charlynne Cousins, MD      . albuterol (PROVENTIL HFA;VENTOLIN HFA) 108 (90 Base) MCG/ACT inhaler 2 puff  2 puff  Inhalation Q4H PRN Charlynne Cousins, MD      . dextrose 5 %-0.45 % sodium chloride infusion   Intravenous Continuous Charlynne Cousins, MD      . feeding supplement (ENSURE ENLIVE) (ENSURE ENLIVE) liquid 237 mL  237 mL Oral BID BM Charlynne Cousins, MD      . feeding supplement (ENSURE ENLIVE) (ENSURE ENLIVE) liquid 237 mL  237 mL Oral BID BM Charlynne Cousins, MD      . Derrill Memo ON 06/06/2017] fluconazole (DIFLUCAN) IVPB 100 mg  100 mg Intravenous Q24H Charlynne Cousins, MD      . fluconazole (DIFLUCAN) IVPB 200 mg  200 mg Intravenous Q24H Charlynne Cousins, MD      . folic acid (FOLVITE) tablet 1 mg  1 mg Oral Daily Charlynne Cousins, MD      . Gerhardt's butt cream 1 application  1 application Topical TID  Charlynne Cousins, MD      . LORazepam (ATIVAN) tablet 1 mg  1 mg Oral Q6H PRN Charlynne Cousins, MD       Or  . LORazepam (ATIVAN) injection 1 mg  1 mg Intravenous Q6H PRN Charlynne Cousins, MD      . LORazepam (ATIVAN) tablet 0-4 mg  0-4 mg Oral Q6H Charlynne Cousins, MD       Followed by  . [START ON 06/07/2017] LORazepam (ATIVAN) tablet 0-4 mg  0-4 mg Oral Q12H Charlynne Cousins, MD      . magnesium oxide (MAG-OX) tablet 400 mg  400 mg Oral Daily Charlynne Cousins, MD      . multivitamin with minerals tablet 1 tablet  1 tablet Oral Daily Charlynne Cousins, MD      . nicotine (NICODERM CQ - dosed in mg/24 hours) patch 21 mg  21 mg Transdermal Daily Charlynne Cousins, MD      . ondansetron [facility]) tablet 4 mg  4 mg Oral Q6H PRN Charlynne Cousins, MD       Or  . ondansetron [facility]) injection 4 mg  4 mg Intravenous Q6H PRN Charlynne Cousins, MD      . pantoprazole (PROTONIX) injection 40 mg  40 mg Intravenous Q12H Charlynne Cousins, MD      . polyethylene glycol [facility] / Floria Raveling) packet 17 g  17 g Oral Daily PRN Charlynne Cousins, MD      . sodium chloride 0.9 % bolus 1,000 mL  1,000 mL Intravenous Once Aileen Fass, Tammi Klippel, MD        . sodium chloride 0.9 % bolus 500 mL  500 mL Intravenous Once Aileen Fass, Tammi Klippel, MD      . sodium polystyrene (KAYEXALATE) powder 15 g  15 g Oral Once Aileen Fass, Tammi Klippel, MD      . sucralfate (CARAFATE) 1 GM/10ML suspension 1 g  1 g Oral TID WC & HS Charlynne Cousins, MD      . thiamine (VITAMIN B-1) tablet 100 mg  100 mg Oral Daily Charlynne Cousins, MD       Or  . thiamine (B-1) injection 100 mg  100 mg Intravenous Daily Charlynne Cousins, MD      . traZODone (DESYREL) tablet 50 mg  50 mg Oral QHS Joy, Shawn C, PA-C   50 mg at 06/05/17 9326   Current Outpatient Medications  Medication Sig Dispense Refill  . albuterol (VENTOLIN HFA) 108 (90 BASE) MCG/ACT inhaler Inhale 2 puffs into the lungs every 4 (four) hours as needed for wheezing or shortness of breath. 1 Inhaler 1  . magnesium oxide (MAG-OX) 400 (241.3 Mg) MG tablet Take 1 tablet (400 mg total) by mouth daily. 30 tablet 1  . nicotine (NICODERM CQ - DOSED IN MG/24 HOURS) 21 mg/24hr patch Place 1 patch (21 mg total) onto the skin daily. 28 patch 0  . pantoprazole (PROTONIX) 40 MG tablet Take 1 tablet (40 mg total) by mouth 2 (two) times daily. 60 tablet 3  . sucralfate (CARAFATE) 1 GM/10ML suspension Take 10 mLs (1 g total) by mouth 4 (four) times daily -  with meals and at bedtime. 420 mL 0  . traZODone (DESYREL) 50 MG tablet Take 1 tablet (50 mg total) by mouth at bedtime. 30 tablet 2  . Emollient (BOUDREAUXS BABY BUTT SMOOTH) OINT Apply to topically three times a day (Patient not taking: Reported on 05/23/2017) 85 g 0  .  feeding supplement, ENSURE ENLIVE, (ENSURE ENLIVE) LIQD Take 237 mLs by mouth 2 (two) times daily between meals. (Patient not taking: Reported on 05/23/2017) 60 Bottle 0  . ferrous sulfate 325 (65 FE) MG tablet Take 1 tablet (325 mg total) by mouth 3 (three) times daily with meals. (Patient not taking: Reported on 06/05/2017) 90 tablet 0  . fluconazole (DIFLUCAN) 100 MG tablet Take 1 tablet (100 mg total) by  mouth daily. (Patient not taking: Reported on 05/23/2017) 10 tablet 0  . folic acid (FOLVITE) 1 MG tablet Take 1 tablet (1 mg total) by mouth daily. (Patient not taking: Reported on 06/05/2017) 30 tablet 0  . HYDROcodone-acetaminophen (NORCO/VICODIN) 5-325 MG tablet Take 1-2 tablets by mouth every 4 (four) hours as needed for moderate pain or severe pain. (Patient not taking: Reported on 05/23/2017) 30 tablet 0  . Hydrocortisone (GERHARDT'S BUTT CREAM) CREA Apply 1 application topically 3 (three) times daily. (Patient not taking: Reported on 05/23/2017) 1 each 5  . thiamine 100 MG tablet Take 1 tablet (100 mg total) by mouth daily. (Patient not taking: Reported on 06/05/2017) 30 tablet 0    Allergies as of 06/04/2017  . (No Known Allergies)    Family History  Problem Relation Age of Onset  . Heart disease Father 95       cabg    Social History   Socioeconomic History  . Marital status: Divorced    Spouse name: Not on file  . Number of children: Not on file  . Years of education: Not on file  . Highest education level: Not on file  Social Needs  . Financial resource strain: Not on file  . Food insecurity - worry: Not on file  . Food insecurity - inability: Not on file  . Transportation needs - medical: Not on file  . Transportation needs - non-medical: Not on file  Occupational History  . Not on file  Tobacco Use  . Smoking status: Current Every Day Smoker    Packs/day: 1.00    Types: Cigarettes  . Smokeless tobacco: Never Used  Substance and Sexual Activity  . Alcohol use: Yes    Alcohol/week: 8.4 oz    Types: 14 Glasses of wine per week  . Drug use: No  . Sexual activity: No  Other Topics Concern  . Not on file  Social History Narrative  . Not on file    Review of Systems: Positive for right leg / right buttock pain. All other systems reviewed and negative except where noted in HPI.  Physical Exam: Vital signs in last 24 hours: Temp:  [97.4 F (36.3 C)-97.6 F  (36.4 C)] 97.4 F (36.3 C) (02/26 0458) Pulse Rate:  [91-110] 99 (02/26 0944) Resp:  [14-20] 20 (02/26 0944) BP: (93-128)/(69-89) 117/76 (02/26 0944) SpO2:  [92 %-100 %] 100 % (02/26 0944)   General:   Alert, thin chronically ill appearing white male in NAD Psych:  Pleasant, cooperative. Normal mood and affect. Eyes:  Pupils equal, sclera clear, no icterus.   Conjunctiva pink. Ears:  Normal auditory acuity. Nose:  No deformity, discharge,  or lesions. Neck:  Supple; no masses Lungs:  Clear throughout to auscultation.  Heart:  Regular rate and rhythm; no murmurs, no edema Abdomen:  Soft, non-distended, nontender, BS active, no palp mass    Rectal:  Deferred  Msk:  Symmetrical without gross deformities. . Neurologic:  Alert and  oriented x4;  grossly normal neurologically. Skin:  Intact without significant lesions or rashes.Marland Kitchen  Intake/Output from previous day: 02/25 0701 - 02/26 0700 In: 1000 [IV Piggyback:1000] Out: -  Intake/Output this shift: No intake/output data recorded.  Lab Results: Recent Labs    06/04/17 1629 06/05/17 0217  WBC 9.8 10.2  HGB 13.8 13.5  HCT 40.8 39.9  PLT 470* 445*   BMET Recent Labs    06/04/17 1629 06/05/17 0217  NA 134* 133*  K 4.0 5.1  CL 94* 99*  CO2 26 24  GLUCOSE 116* 90  BUN 28* 28*  CREATININE 1.70* 1.80*  CALCIUM 10.5* 9.6   LFT Recent Labs    06/05/17 0217  PROT 6.7  ALBUMIN 2.9*  AST 55*  ALT 14*  ALKPHOS 176*  BILITOT 1.0    Studies/Results: Dg Chest 2 View  Result Date: 06/05/2017 CLINICAL DATA:  67 y/o M; confusion, dehydration, and difficulty walking. Altered mental status. EXAM: CHEST  2 VIEW COMPARISON:  04/20/2017 chest radiograph.  11/29/2016 CT chest. FINDINGS: Normal cardiac silhouette. Aortic atherosclerosis with calcification. Small to moderate hiatal hernia. Emphysema hyperinflated lungs compatible with COPD. No focal consolidation. No pleural effusion or pneumothorax. No acute osseous abnormality is  evident. IMPRESSION: Small to moderate hiatal hernia. COPD. No acute pulmonary process identified. Electronically Signed   By: Kristine Garbe M.D.   On: 06/05/2017 02:06   Ct Head Wo Contrast  Result Date: 06/05/2017 CLINICAL DATA:  67 y/o  M; confusion and weakness. EXAM: CT HEAD WITHOUT CONTRAST TECHNIQUE: Contiguous axial images were obtained from the base of the skull through the vertex without intravenous contrast. COMPARISON:  None. FINDINGS: Brain: No evidence of acute infarction, hemorrhage, hydrocephalus, extra-axial collection or mass lesion/mass effect. Scattered nonspecific foci of hypoattenuation in subcortical and periventricular white matter compatible with moderate chronic microvascular ischemic changes for age and there is moderate frontal parietal predominance brain parenchymal volume loss. Vascular: Mild calcific atherosclerosis of carotid siphons. No hyperdense vessel. Skull: Normal. Negative for fracture or focal lesion. Sinuses/Orbits: No acute finding. Other: None. IMPRESSION: 1. No acute intracranial abnormality identified. 2. Moderate for age chronic microvascular ischemic changes. Moderate brain atrophy with frontal and parietal predominance. Electronically Signed   By: Kristine Garbe M.D.   On: 06/05/2017 02:03   Mr Brain Wo Contrast  Result Date: 06/05/2017 CLINICAL DATA:  67 year old male with confusion and altered mental status since yesterday. EXAM: MRI HEAD WITHOUT CONTRAST TECHNIQUE: Multiplanar, multiecho pulse sequences of the brain and surrounding structures were obtained without intravenous contrast. COMPARISON:  Head CT without contrast 0151 hours today. FINDINGS: Brain: No restricted diffusion to suggest acute infarction. No midline shift, mass effect, evidence of mass lesion, ventriculomegaly, extra-axial collection or acute intracranial hemorrhage. Cervicomedullary junction and pituitary are within normal limits. Cerebral volume loss over that  expected for age. There seems to be disproportionate atrophy of the frontal and parietal lobes (series 10, image 14). No cortical encephalomalacia identified. No chronic cerebral blood products. Mild to moderate for age patchy periventricular, splenium corpus callosum, and pontine nonspecific T2 and FLAIR hyperintensity. The deep gray matter nuclei and cerebellum appear within normal limits for age. Vascular: Major intracranial vascular flow voids are preserved. Skull and upper cervical spine: Normal visible cervical spine. Normal bone marrow signal. Sinuses/Orbits: Postoperative changes to both globes, otherwise normal orbits soft tissues. Paranasal sinuses are clear. Other: Visible internal auditory structures appear normal. Mastoids are clear. Scalp and face soft tissues appear negative. IMPRESSION: 1.  No acute intracranial abnormality. 2. Cerebral volume loss with disproportionate bilateral frontoparietal involvement. 3. Mild to moderate for  age nonspecific signal changes in the cerebral white matter and pons, most commonly due to chronic small vessel disease. Electronically Signed   By: Genevie Ann M.D.   On: 06/05/2017 07:16   Dg Hip Unilat W Or Wo Pelvis 2-3 Views Right  Result Date: 06/05/2017 CLINICAL DATA:  67 year old male with right hip pain. EXAM: DG HIP (WITH OR WITHOUT PELVIS) 2-3V RIGHT; RIGHT FEMUR 2 VIEWS COMPARISON:  Abdominal CT dated 01/04/2017. FINDINGS: There is no acute fracture or dislocation. 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 There is narrowing of the inferior aspect of the right femoroacetabular joint space. The bones are osteopenic. The soft tissues appear unremarkable. IMPRESSION: 1. No acute fracture or dislocation. 2. Osteopenia and osteoarthritic changes of the hips. Electronically Signed   By: Anner Crete M.D.   On: 06/05/2017 02:02   Dg Femur Min 2 Views Right  Result Date: 06/05/2017 CLINICAL DATA:  67 year old male with right hip  pain. EXAM: DG HIP (WITH OR WITHOUT PELVIS) 2-3V RIGHT; RIGHT FEMUR 2 VIEWS COMPARISON:  Abdominal CT dated 01/04/2017. FINDINGS: There is no acute fracture or dislocation. 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 There is narrowing of the inferior aspect of the right femoroacetabular joint space. The bones are osteopenic. The soft tissues appear unremarkable. IMPRESSION: 1. No acute fracture or dislocation. 2. Osteopenia and osteoarthritic changes of the hips. Electronically Signed   By: Anner Crete M.D.   On: 06/05/2017 02:02     Tye Savoy, NP-C @  06/05/2017, 11:23 AM  Pager number [phone]

## 2017-06-05 NOTE — ED Notes (Signed)
Pt assisted to sit up in bed to eat breakfast.

## 2017-06-05 NOTE — ED Provider Notes (Signed)
[facility] DEPT Provider Note   CSN: [id number] Arrival date & time: 06/04/17  1533     History   Chief Complaint Chief Complaint  Patient presents with  . Leg Pain  . Emesis    HPI ODYSSEUS CADA is a 67 y.o. male.  HPI   Level 5 caveat due to altered mental status.  PAZ WINSETT is a 67 y.o. male, with a history of GERD and previous alcohol use, presenting to the ED with right leg weakness for the last 6 months.  Mild to moderate, aching, right upper leg and right hip pain worsening over the last several months.  He states he has had difficulty eating with significant increase in his GERD symptoms as well as "vomiting mucus."  He adds he has lost 40 pounds in 40 days, has developed a tremor, and an increased cough.  Constipation over the last week, last bowel movement yesterday and was hard.  He is a current 0.75 PVD smoker.  He states he used to be a daily alcohol drinker, consuming 2-3 drinks of gin daily, but adds he quit a month ago.   Patient was sent here by his PCP's office.  They had concerned about patient confusion stating he arrived for his appointment a day early, had his shoes on the wrong feet, and could not correctly answer the date or the president.  Patient denies shortness of breath, chest pain, abdominal pain, urinary complaints, lower extremity swelling, fever/chills, diarrhea, falls/trauma, or any other complaints.  Patient states he lives with his son.  Past Medical History:  Diagnosis Date  . Colon polyps   . Depression   . Diverticulitis   . Diverticulosis of colon   . GERD (gastroesophageal reflux disease)   . Kidney stone     Patient Active Problem List   Diagnosis Date Noted  . Acute encephalopathy 06/05/2017  . Failure to thrive in adult 04/20/2017  . Adjustment disorder with depressed mood 04/15/2017  . Protein-calorie malnutrition, severe 04/12/2017  . Palliative care by specialist   . DNR (do not  resuscitate)   . GI bleed 04/10/2017  . FTT (failure to thrive) in adult 04/09/2017  . Weakness   . Alcohol withdrawal ([place]) 01/04/2017  . Esophagitis 01/04/2017  . Decubitus ulcer of buttock, stage 1   . Upper GI bleed 12/20/2016  . AKI (acute kidney injury) ([place]) 12/20/2016  . 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 Encounter for palliative care   . Goals of care, counseling/discussion   . Dysphagia 11/30/2016  . Anemia 11/25/2016  . Pressure ulcer of sacral region, stage 2 11/25/2016  . Alcohol abuse 11/25/2016  . Hypotension 11/25/2016  . Hypomagnesemia 11/25/2016  . Hyponatremia 11/25/2016  . Non-traumatic rhabdomyolysis 11/25/2016  . Erosive esophagitis 05/14/2015  . Hypokalemia 04/30/2015  . Thrombocytopenia ([place]) 04/16/2015  . Alcohol dependence ([place]) 03/17/2015  . Change in bowel habits 02/23/2015  . Rectal bleeding 02/23/2015  . Constipation 02/23/2015  . Transaminasemia 03/27/2013  . Essential tremor 03/27/2013  . Hypertension 07/19/2012  . TOBACCO USE 03/01/2010  . DIVERTICULOSIS, COLON 05/14/2007  . DEPRESSION 11/05/2006  . COLONIC POLYPS, HX OF 11/05/2006    Past Surgical History:  Procedure Laterality Date  . COLONOSCOPY    . ESOPHAGOGASTRODUODENOSCOPY (EGD) WITH PROPOFOL N/A 12/21/2016   Procedure: ESOPHAGOGASTRODUODENOSCOPY (EGD) WITH PROPOFOL;  Surgeon: Doran Stabler, MD;  Location: [facility] ENDOSCOPY;  Service: Gastroenterology;  Laterality: N/A;  . UPPER GASTROINTESTINAL ENDOSCOPY  Home Medications    Prior to Admission medications   Medication Sig Start Date End Date Taking? Authorizing Provider  albuterol (VENTOLIN HFA) 108 (90 BASE) MCG/ACT inhaler Inhale 2 puffs into the lungs every 4 (four) hours as needed for wheezing or shortness of breath. 08/17/14  Yes Burchette, Alinda Sierras, MD  magnesium oxide (MAG-OX) 400 (241.3 Mg) MG tablet Take 1 tablet (400 mg total) by mouth daily. 04/28/17  Yes Theodis Blaze, MD  nicotine  (NICODERM CQ - DOSED IN MG/24 HOURS) 21 mg/24hr patch Place 1 patch (21 mg total) onto the skin daily. 12/02/16  Yes Short, Noah Delaine, MD  pantoprazole (PROTONIX) 40 MG tablet Take 1 tablet (40 mg total) by mouth 2 (two) times daily. 05/23/17  Yes Willia Craze, NP  sucralfate (CARAFATE) 1 GM/10ML suspension Take 10 mLs (1 g total) by mouth 4 (four) times daily -  with meals and at bedtime. 04/27/17  Yes Theodis Blaze, MD  traZODone (DESYREL) 50 MG tablet Take 1 tablet (50 mg total) by mouth at bedtime. 05/14/17  Yes Burchette, Alinda Sierras, MD  Emollient (BOUDREAUXS BABY BUTT SMOOTH) OINT Apply to topically three times a day Patient not taking: Reported on 05/23/2017 05/08/17   [name], MD  feeding supplement, ENSURE ENLIVE, (ENSURE ENLIVE) LIQD Take 237 mLs by mouth 2 (two) times daily between meals. Patient not taking: Reported on 05/23/2017 12/01/16   Janece Canterbury, MD  ferrous sulfate 325 (65 FE) MG tablet Take 1 tablet (325 mg total) by mouth 3 (three) times daily with meals. Patient not taking: Reported on 06/05/2017 12/01/16   Janece Canterbury, MD  fluconazole (DIFLUCAN) 100 MG tablet Take 1 tablet (100 mg total) by mouth daily. Patient not taking: Reported on 05/23/2017 05/04/17   [name], MD  folic acid (FOLVITE) 1 MG tablet Take 1 tablet (1 mg total) by mouth daily. Patient not taking: Reported on 06/05/2017 12/02/16   Janece Canterbury, MD  HYDROcodone-acetaminophen (NORCO/VICODIN) 5-325 MG tablet Take 1-2 tablets by mouth every 4 (four) hours as needed for moderate pain or severe pain. Patient not taking: Reported on 05/23/2017 04/27/17   Theodis Blaze, MD  Hydrocortisone (GERHARDT'S BUTT CREAM) CREA Apply 1 application topically 3 (three) times daily. Patient not taking: Reported on 05/23/2017 04/27/17   Theodis Blaze, MD  thiamine 100 MG tablet Take 1 tablet (100 mg total) by mouth daily. Patient not taking: Reported on 06/05/2017 12/02/16   Janece Canterbury, MD    Family  History Family History  Problem Relation Age of Onset  . Heart disease Father 8       cabg    Social History Social History   Tobacco Use  . Smoking status: Current Every Day Smoker    Packs/day: 1.00    Types: Cigarettes  . Smokeless tobacco: Never Used  Substance Use Topics  . Alcohol use: Yes    Alcohol/week: 8.4 oz    Types: 14 Glasses of wine per week  . Drug use: No     Allergies   Patient has no known allergies.   Review of Systems Review of Systems  Constitutional: Negative for chills, diaphoresis and fever.  Respiratory: Negative for shortness of breath.   Cardiovascular: Negative for chest pain and leg swelling.  Gastrointestinal: Positive for constipation, nausea and vomiting. Negative for abdominal pain, blood in stool and diarrhea.  Genitourinary: Negative for dysuria, frequency and hematuria.  Neurological: Negative for dizziness, light-headedness and headaches.  All other systems reviewed  and are negative.    Physical Exam Updated Vital Signs BP 93/69 (BP Location: Right Arm)   Pulse (!) 110   Temp 97.6 F (36.4 C) (Oral)   Resp 18   SpO2 92%   Physical Exam  Constitutional: He appears cachectic. No distress.  HENT:  Head: Normocephalic and atraumatic.  Eyes: Conjunctivae and EOM are normal. Pupils are equal, round, and reactive to light.  Neck: Normal range of motion. Neck supple.  Cardiovascular: Regular rhythm and normal heart sounds. Tachycardia present.  Pulses:      Popliteal pulses are 2+ on the right side, and 2+ on the left side.       Dorsalis pedis pulses are Detected w/ doppler on the right side, and Detected w/ doppler on the left side.       Posterior tibial pulses are Detected w/ doppler on the right side, and 2+ on the left side.  Pulmonary/Chest: Effort normal and breath sounds normal. No respiratory distress.  Abdominal: Soft. There is no tenderness. There is no guarding.  Musculoskeletal: He exhibits no edema.  Minor  tenderness to the right lateral hip without noted swelling, deformity, crepitus, or laxity.  Patient has full range of motion in the cardinal directions of each joint of the upper and lower extremities.  Bilateral lower extremities are equally cool to the touch starting around the lower third of the lower legs. Motor function intact bilaterally. The extremities are not pale or blue.   Lymphadenopathy:    He has no cervical adenopathy.  Neurological: He is alert.  Patient is oriented to self and place, but cannot correctly answer the date, the president, or why his PCP office sent him to the hospital. His shoes were indeed on the wrong feet. No noted sensory deficits.  No noted speech deficits. No aphasia. Patient handles oral secretions without difficulty. No noted swallowing defects.  Equal grip strength bilaterally. Strength 4/5 in the upper extremities. Strength 4/5 with flexion and extension of the hips, knees, and ankles bilaterally.  Patellar DTRs 2+ bilaterally Negative Romberg. No gait disturbance.  Coordination intact including heel to shin and finger to nose.  Cranial nerves III-XII grossly intact.  No facial droop.   Patient has both resting and intention tremor in the upper extremities bilaterally.  Skin: Skin is warm and dry. Capillary refill takes more than 3 seconds. Capillary refill delayed in the toes bilaterally.Marland Kitchen He is not diaphoretic.  Psychiatric: He has a normal mood and affect. His behavior is normal.  Nursing note and vitals reviewed.    ED Treatments / Results  Labs (all labs ordered are listed, but only abnormal results are displayed) Labs Reviewed  COMPREHENSIVE METABOLIC PANEL - Abnormal; Notable for the following components:      Result Value   Sodium 134 (*)    Chloride 94 (*)    Glucose, Bld 116 (*)    BUN 28 (*)    Creatinine, Ser 1.70 (*)    Calcium 10.5 (*)    Total Protein 8.7 (*)    AST 47 (*)    Alkaline Phosphatase 216 (*)    GFR calc  non Af Amer 41 (*)    GFR calc Af Amer 47 (*)    All other components within normal limits  CBC - Abnormal; Notable for the following components:   Platelets 470 (*)    All other components within normal limits  ACETAMINOPHEN LEVEL - Abnormal; Notable for the following components:   Acetaminophen (Tylenol), Serum <  10 (*)    All other components within normal limits  CBC WITH DIFFERENTIAL/PLATELET - Abnormal; Notable for the following components:   Platelets 445 (*)    Monocytes Absolute 1.1 (*)    All other components within normal limits  LIPASE, BLOOD  AMMONIA  SALICYLATE LEVEL  ETHANOL  MAGNESIUM  URINALYSIS, ROUTINE W REFLEX MICROSCOPIC  RAPID URINE DRUG SCREEN, HOSP PERFORMED  COMPREHENSIVE METABOLIC PANEL  SEDIMENTATION RATE  C-REACTIVE PROTEIN  I-STAT TROPONIN, ED    EKG  EKG Interpretation  Date/Time:  Tuesday June 05 2017 02:42:36 EST Ventricular Rate:  92 PR Interval:    QRS Duration: 115 QT Interval:  375 QTC Calculation: 464 R Axis:   86 Text Interpretation:  Sinus rhythm Nonspecific intraventricular conduction delay Confirmed by Pryor Curia [phone]) on 06/05/2017 2:44:41 AM       Radiology Dg Chest 2 View  Result Date: 06/05/2017 CLINICAL DATA:  67 y/o M; confusion, dehydration, and difficulty walking. Altered mental status. EXAM: CHEST  2 VIEW COMPARISON:  04/20/2017 chest radiograph.  11/29/2016 CT chest. FINDINGS: Normal cardiac silhouette. Aortic atherosclerosis with calcification. Small to moderate hiatal hernia. Emphysema hyperinflated lungs compatible with COPD. No focal consolidation. No pleural effusion or pneumothorax. No acute osseous abnormality is evident. IMPRESSION: Small to moderate hiatal hernia. COPD. No acute pulmonary process identified. Electronically Signed   By: Kristine Garbe M.D.   On: 06/05/2017 02:06   Ct Head Wo Contrast  Result Date: 06/05/2017 CLINICAL DATA:  67 y/o  M; confusion and weakness. EXAM: CT HEAD  WITHOUT CONTRAST TECHNIQUE: Contiguous axial images were obtained from the base of the skull through the vertex without intravenous contrast. COMPARISON:  None. FINDINGS: Brain: No evidence of acute infarction, hemorrhage, hydrocephalus, extra-axial collection or mass lesion/mass effect. Scattered nonspecific foci of hypoattenuation in subcortical and periventricular white matter compatible with moderate chronic microvascular ischemic changes for age and there is moderate frontal parietal predominance brain parenchymal volume loss. Vascular: Mild calcific atherosclerosis of carotid siphons. No hyperdense vessel. Skull: Normal. Negative for fracture or focal lesion. Sinuses/Orbits: No acute finding. Other: None. IMPRESSION: 1. No acute intracranial abnormality identified. 2. Moderate for age chronic microvascular ischemic changes. Moderate brain atrophy with frontal and parietal predominance. Electronically Signed   By: Kristine Garbe M.D.   On: 06/05/2017 02:03   Dg Hip Unilat W Or Wo Pelvis 2-3 Views Right  Result Date: 06/05/2017 CLINICAL DATA:  67 year old male with right hip pain. EXAM: DG HIP (WITH OR WITHOUT PELVIS) 2-3V RIGHT; RIGHT FEMUR 2 VIEWS COMPARISON:  Abdominal CT dated 01/04/2017. FINDINGS: There is no acute fracture or dislocation. 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 There is narrowing of the inferior aspect of the right femoroacetabular joint space. The bones are osteopenic. The soft tissues appear unremarkable. IMPRESSION: 1. No acute fracture or dislocation. 2. Osteopenia and osteoarthritic changes of the hips. Electronically Signed   By: Anner Crete M.D.   On: 06/05/2017 02:02   Dg Femur Min 2 Views Right  Result Date: 06/05/2017 CLINICAL DATA:  67 year old male with right hip pain. EXAM: DG HIP (WITH OR WITHOUT PELVIS) 2-3V RIGHT; RIGHT FEMUR 2 VIEWS COMPARISON:  Abdominal CT dated 01/04/2017. FINDINGS: There is no acute fracture or  dislocation. 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 There is narrowing of the inferior aspect of the right femoroacetabular joint space. The bones are osteopenic. The soft tissues appear unremarkable. IMPRESSION: 1. No acute fracture or dislocation. 2.  Osteopenia and osteoarthritic changes of the hips. Electronically Signed   By: Anner Crete M.D.   On: 06/05/2017 02:02    Procedures Procedures (including critical care time)  Medications Ordered in ED Medications  0.9 %  sodium chloride infusion (not administered)  traZODone (DESYREL) tablet 50 mg (not administered)  ondansetron (ZOFRAN-ODT) disintegrating tablet 4 mg (4 mg Oral Given 06/04/17 1550)  thiamine (B-1) injection 100 mg (100 mg Intravenous Given 06/05/17 0224)  sodium chloride 0.9 % bolus 1,000 mL (0 mLs Intravenous Stopped 06/05/17 0358)     Initial Impression / Assessment and Plan / ED Course  I have reviewed the triage vital signs and the nursing notes.  Pertinent labs & imaging results that were available during my care of the patient were reviewed by me and considered in my medical decision making (see chart for details).  Clinical Course as of Jun 05 520  Tue Jun 05, 2017  4599 Spoke with Dr. Tamala Julian, hospitalist. Agrees to admit the patient.  [SJ]    Clinical Course User Index [SJ] Shanaye Rief C, PA-C    Patient presents with confusion.  Per patient's PCPs note, this is abnormal for the patient.  No acute abnormalities noted on imaging.  AKI noted with creatinine increase, 0.65 a month ago and 1.70 today.  Hypercalcemia and elevated alk phos also noted.  I do suspect a pathologic process causing the patient's symptoms and lab abnormalities, one of which would be neoplasm.  Doubt sepsis or infectious cause, though urinalysis pending on admission. MRI brain also pending.   Findings and plan of care discussed with [facility], DO. Dr. Leonides Schanz personally evaluated and examined this  patient.  Vitals:   06/04/17 1548 06/04/17 2147 06/05/17 0238 06/05/17 0458  BP: 101/78 93/69 97/73  128/89  Pulse: (!) 101 (!) 110 94 91  Resp: 18 18 14 16   Temp: 97.6 F (36.4 C)  (!) 97.4 F (36.3 C) (!) 97.4 F (36.3 C)  TempSrc: Oral  Oral Oral  SpO2: 100% 92% 95% 100%      Final Clinical Impressions(s) / ED Diagnoses   Final diagnoses:  Confusion  Hypercalcemia  AKI (acute kidney injury) [facility])    ED Discharge Orders    None       Lorayne Bender, PA-C 06/05/17 0522    Ward, Delice Bison, DO 06/05/17 [phone]

## 2017-06-05 NOTE — H&P (Signed)
History and Physical    Patrick [name] ZSW:[id number] DOB: June 24, 1950 DOA: 06/05/2017  PCP: [name], MD  Patient coming from: Home   Chief Complaint: sent from PCP office  HPI: Patrick [name] is a 67 y.o. male with medical history significant of past medical history of alcohol abuse more recently discharged from the hospital for generalized weakness dehydration hyponatremia and questionable Candida esophagitis (his last endoscopy on 12/21/2016 showed reflux esophagitis) sent in by his PCPs office as patient went on the wrong date was oriented only to person, complaining of emesis weight loss of 40 pounds in the last 30 days, patient relates significant pain with swallowing and occasional emesis.  ED Course:  In the ED he showed hyponatremia hyperkalemia new acute renal failure (with a baseline creatinine of less than 0.8).  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    Past Medical History:  Diagnosis Date  . Colon polyps   . Depression   . Diverticulitis   . Diverticulosis of colon   . GERD (gastroesophageal reflux disease)   . Kidney stone     Past Surgical History:  Procedure Laterality Date  . COLONOSCOPY    . ESOPHAGOGASTRODUODENOSCOPY (EGD) WITH PROPOFOL N/A 12/21/2016   Procedure: ESOPHAGOGASTRODUODENOSCOPY (EGD) WITH PROPOFOL;  Surgeon: Doran Stabler, MD;  Location: [facility];  Service: Gastroenterology;  Laterality: N/A;  . UPPER GASTROINTESTINAL ENDOSCOPY       reports that he has been smoking cigarettes.  He has been smoking about 1.00 pack per day. he has never used smokeless tobacco. He reports that he drinks about 8.4 oz of alcohol per week. He reports that he does not use drugs.  No Known Allergies  Family History  Problem Relation Age of  Onset  . Heart disease Father 64       cabg     Prior to Admission medications   Medication Sig Start Date End Date Taking? Authorizing Provider  albuterol (VENTOLIN HFA) 108 (90 BASE) MCG/ACT inhaler Inhale 2 puffs into the lungs every 4 (four) hours as needed for wheezing or shortness of breath. 08/17/14  Yes Burchette, Alinda Sierras, MD  magnesium oxide (MAG-OX) 400 (241.3 Mg) MG tablet Take 1 tablet (400 mg total) by mouth daily. 04/28/17  Yes Theodis Blaze, MD  nicotine (NICODERM CQ - DOSED IN MG/24 HOURS) 21 mg/24hr patch Place 1 patch (21 mg total) onto the skin daily. 12/02/16  Yes Short, Noah Delaine, MD  pantoprazole (PROTONIX) 40 MG tablet Take 1 tablet (40 mg total) by mouth 2 (two) times daily. 05/23/17  Yes Willia Craze, NP  sucralfate (CARAFATE) 1 GM/10ML suspension Take 10 mLs (1 g total) by mouth 4 (four) times daily -  with meals and at bedtime. 04/27/17  Yes Theodis Blaze, MD  traZODone (DESYREL) 50 MG tablet Take 1 tablet (50 mg total) by mouth at bedtime. 05/14/17  Yes Burchette, Alinda Sierras, MD  Emollient (BOUDREAUXS BABY BUTT SMOOTH) OINT Apply to topically three times a day Patient not taking: Reported on 05/23/2017 05/08/17   [name], MD  feeding supplement, ENSURE ENLIVE, (ENSURE ENLIVE) LIQD Take 237 mLs by mouth 2 (two) times daily between meals. Patient not taking: Reported on 05/23/2017 12/01/16   Janece Canterbury, MD  ferrous  sulfate 325 (65 FE) MG tablet Take 1 tablet (325 mg total) by mouth 3 (three) times daily with meals. Patient not taking: Reported on 06/05/2017 12/01/16   Janece Canterbury, MD  fluconazole (DIFLUCAN) 100 MG tablet Take 1 tablet (100 mg total) by mouth daily. Patient not taking: Reported on 05/23/2017 05/04/17   [name], MD  folic acid (FOLVITE) 1 MG tablet Take 1 tablet (1 mg total) by mouth daily. Patient not taking: Reported on 06/05/2017 12/02/16   Janece Canterbury, MD  HYDROcodone-acetaminophen (NORCO/VICODIN) 5-325 MG tablet Take 1-2  tablets by mouth every 4 (four) hours as needed for moderate pain or severe pain. Patient not taking: Reported on 05/23/2017 04/27/17   Theodis Blaze, MD  Hydrocortisone (GERHARDT'S BUTT CREAM) CREA Apply 1 application topically 3 (three) times daily. Patient not taking: Reported on 05/23/2017 04/27/17   Theodis Blaze, MD  thiamine 100 MG tablet Take 1 tablet (100 mg total) by mouth daily. Patient not taking: Reported on 06/05/2017 12/02/16   Janece Canterbury, MD    Physical Exam: Vitals:   06/04/17 1548 06/04/17 2147 06/05/17 0238 06/05/17 0458  BP: 1_0 128/89  Pulse: (!) 101 (!) 110 94 91  Resp: _1 Temp: 97.6 F (36.4 C)  (!) 97.4 F (36.3 C) (!) 97.4 F (36.3 C)  TempSrc: Oral  Oral Oral  SpO2: 100% 92% 95% 100%    Constitutional: NAD, calm, comfortable, cachectic Vitals:   06/04/17 1548 06/04/17 2147 06/05/17 0238 06/05/17 0458  BP: 1_2 128/89  Pulse: (!) 101 (!) 110 94 91  Resp: _3 Temp: 97.6 F (36.4 C)  (!) 97.4 F (36.3 C) (!) 97.4 F (36.3 C)  TempSrc: Oral  Oral Oral  SpO2: 100% 92% 95% 100%   Eyes: PERRL, lids and conjunctivae normal ENMT: Mucous membranes are moist. Posterior pharynx clear of any exudate or lesions.Normal dentition.  Neck: No JVD Respiratory: Good air movement and clear to auscultation Cardiovascular: Regular rate and rhythm, no murmurs / rubs / gallops. No extremity edema. 2+ pedal pulses. No carotid bruits.  Abdomen: no tenderness, no masses palpated. No hepatosplenomegaly. Bowel sounds positive.  Musculoskeletal: no clubbing / cyanosis. No joint deformity upper and lower extremities. Good ROM, no contractures. Normal muscle tone.  Skin: no rashes, lesions, ulcers. No induration Neurologic: CN 2-12 grossly intact. Sensation intact, DTR normal. Strength 5/5 in all 4.  Psychiatric: Normal judgment and insight. Alert and oriented x 3. Normal mood.    Labs on Admission: I have personally reviewed  following labs and imaging studies  CBC: Recent Labs  Lab 06/04/17 1629 06/05/17 0217  WBC 9.8 10.2  NEUTROABS  --  6.4  HGB 13.8 13.5  HCT 40.8 39.9  MCV 92.1 93.2  PLT 470* 062*   Basic Metabolic Panel: Recent Labs  Lab 06/04/17 1629 06/05/17 0217  NA 134* 133*  K 4.0 5.1  CL 94* 99*  CO2 26 24  GLUCOSE 116* 90  BUN 28* 28*  CREATININE 1.70* 1.80*  CALCIUM 10.5* 9.6  MG  --  1.9   GFR: Estimated Creatinine Clearance: 27.9 mL/min (A) (by C-G formula based on SCr of 1.8 mg/dL (H)). Liver Function Tests: Recent Labs  Lab 06/04/17 1629 06/05/17 0217  AST 47* 55*  ALT 20 14*  ALKPHOS 216* 176*  BILITOT 0.9 1.0  PROT 8.7* 6.7  ALBUMIN 3.7 2.9*   Recent Labs  Lab 06/04/17 1629  LIPASE 25  Recent Labs  Lab 06/05/17 0217  AMMONIA 34   Coagulation Profile: No results for input(s): INR, PROTIME in the last 168 hours. Cardiac Enzymes: No results for input(s): CKTOTAL, CKMB, CKMBINDEX, TROPONINI in the last 168 hours. BNP (last 3 results) No results for input(s): PROBNP in the last 8760 hours. HbA1C: No results for input(s): HGBA1C in the last 72 hours. CBG: No results for input(s): GLUCAP in the last 168 hours. Lipid Profile: No results for input(s): CHOL, HDL, LDLCALC, TRIG, CHOLHDL, LDLDIRECT in the last 72 hours. Thyroid Function Tests: No results for input(s): TSH, T4TOTAL, FREET4, T3FREE, THYROIDAB in the last 72 hours. Anemia Panel: No results for input(s): VITAMINB12, FOLATE, FERRITIN, TIBC, IRON, RETICCTPCT in the last 72 hours. Urine analysis:    Component Value Date/Time   COLORURINE AMBER (A) 04/20/2017 1856   APPEARANCEUR CLEAR 04/20/2017 1856   LABSPEC 1.019 04/20/2017 1856   PHURINE 5.0 04/20/2017 1856   GLUCOSEU NEGATIVE 04/20/2017 1856   HGBUR NEGATIVE 04/20/2017 1856   HGBUR negative 02/21/2010 1055   BILIRUBINUR NEGATIVE 04/20/2017 1856   BILIRUBINUR n 03/11/2014 1025   KETONESUR 5 (A) 04/20/2017 1856   PROTEINUR NEGATIVE  04/20/2017 1856   UROBILINOGEN 0.2 03/11/2014 1025   UROBILINOGEN 0.2 02/21/2010 1055   NITRITE NEGATIVE 04/20/2017 1856   LEUKOCYTESUR NEGATIVE 04/20/2017 1856    Radiological Exams on Admission: Dg Chest 2 View  Result Date: 06/05/2017 CLINICAL DATA:  67 y/o M; confusion, dehydration, and difficulty walking. Altered mental status. EXAM: CHEST  2 VIEW COMPARISON:  04/20/2017 chest radiograph.  11/29/2016 CT chest. FINDINGS: Normal cardiac silhouette. Aortic atherosclerosis with calcification. Small to moderate hiatal hernia. Emphysema hyperinflated lungs compatible with COPD. No focal consolidation. No pleural effusion or pneumothorax. No acute osseous abnormality is evident. IMPRESSION: Small to moderate hiatal hernia. COPD. No acute pulmonary process identified. Electronically Signed   By: Kristine Garbe M.D.   On: 06/05/2017 02:06   Ct Head Wo Contrast  Result Date: 06/05/2017 CLINICAL DATA:  67 y/o  M; confusion and weakness. EXAM: CT HEAD WITHOUT CONTRAST TECHNIQUE: Contiguous axial images were obtained from the base of the skull through the vertex without intravenous contrast. COMPARISON:  None. FINDINGS: Brain: No evidence of acute infarction, hemorrhage, hydrocephalus, extra-axial collection or mass lesion/mass effect. Scattered nonspecific foci of hypoattenuation in subcortical and periventricular white matter compatible with moderate chronic microvascular ischemic changes for age and there is moderate frontal parietal predominance brain parenchymal volume loss. Vascular: Mild calcific atherosclerosis of carotid siphons. No hyperdense vessel. Skull: Normal. Negative for fracture or focal lesion. Sinuses/Orbits: No acute finding. Other: None. IMPRESSION: 1. No acute intracranial abnormality identified. 2. Moderate for age chronic microvascular ischemic changes. Moderate brain atrophy with frontal and parietal predominance. Electronically Signed   By: Kristine Garbe M.D.    On: 06/05/2017 02:03   Mr Brain Wo Contrast  Result Date: 06/05/2017 CLINICAL DATA:  67 year old male with confusion and altered mental status since yesterday. EXAM: MRI HEAD WITHOUT CONTRAST TECHNIQUE: Multiplanar, multiecho pulse sequences of the brain and surrounding structures were obtained without intravenous contrast. COMPARISON:  Head CT without contrast 0151 hours today. FINDINGS: Brain: No restricted diffusion to suggest acute infarction. No midline shift, mass effect, evidence of mass lesion, ventriculomegaly, extra-axial collection or acute intracranial hemorrhage. Cervicomedullary junction and pituitary are within normal limits. Cerebral volume loss over that expected for age. There seems to be disproportionate atrophy of the frontal and parietal lobes (series 10, image 14). No cortical encephalomalacia identified. No chronic cerebral  blood products. Mild to moderate for age patchy periventricular, splenium corpus callosum, and pontine nonspecific T2 and FLAIR hyperintensity. The deep gray matter nuclei and cerebellum appear within normal limits for age. Vascular: Major intracranial vascular flow voids are preserved. Skull and upper cervical spine: Normal visible cervical spine. Normal bone marrow signal. Sinuses/Orbits: Postoperative changes to both globes, otherwise normal orbits soft tissues. Paranasal sinuses are clear. Other: Visible internal auditory structures appear normal. Mastoids are clear. Scalp and face soft tissues appear negative. IMPRESSION: 1.  No acute intracranial abnormality. 2. Cerebral volume loss with disproportionate bilateral frontoparietal involvement. 3. Mild to moderate for age nonspecific signal changes in the cerebral white matter and pons, most commonly due to chronic small vessel disease. Electronically Signed   By: Genevie Ann M.D.   On: 06/05/2017 07:16   Dg Hip Unilat W Or Wo Pelvis 2-3 Views Right  Result Date: 06/05/2017 CLINICAL DATA:  67 year old male with right  hip pain. EXAM: DG HIP (WITH OR WITHOUT PELVIS) 2-3V RIGHT; RIGHT FEMUR 2 VIEWS COMPARISON:  Abdominal CT dated 01/04/2017. FINDINGS: There is no acute fracture or dislocation. 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 There is narrowing of the inferior aspect of the right femoroacetabular joint space. The bones are osteopenic. The soft tissues appear unremarkable. IMPRESSION: 1. No acute fracture or dislocation. 2. Osteopenia and osteoarthritic changes of the hips. Electronically Signed   By: Anner Crete M.D.   On: 06/05/2017 02:02   Dg Femur Min 2 Views Right  Result Date: 06/05/2017 CLINICAL DATA:  67 year old male with right hip pain. EXAM: DG HIP (WITH OR WITHOUT PELVIS) 2-3V RIGHT; RIGHT FEMUR 2 VIEWS COMPARISON:  Abdominal CT dated 01/04/2017. FINDINGS: There is no acute fracture or dislocation. 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 There is narrowing of the inferior aspect of the right femoroacetabular joint space. The bones are osteopenic. The soft tissues appear unremarkable. IMPRESSION: 1. No acute fracture or dislocation. 2. Osteopenia and osteoarthritic changes of the hips. Electronically Signed   By: Anner Crete M.D.   On: 06/05/2017 02:02    EKG: Independently reviewed.  Sinus rhythm nonspecific intraventricular blocks.  Assessment/Plan Acute encephalopathy: Unclear etiology, it could be because of his new acute renal failure. More of the brain show frontotemporal atrophy, and his encephalopathy could be the beginning of a frontal temporal dementia.  So in the differential could be Wernicke's encephalopathy although he is not confabulating. I Have been unsuccessful contacting his brother which is who he lives with. He has remained afebrile and has no leukocytosis acetaminophen alcohol level and salicylate levels are low. He has an elevated sed rate  Odynophagia likely due to esophagitis: Will start on  Protonix IV twice a day and IV Diflucan we will call GI for possible endoscopy. Also start him on his Carafate going back to the GIs notes he has not been compliant with his Carafate and only taking PPI once a day. Patient and ESR and thrombocytosis likely due to esophagitis.  Acute kidney injury: Likely due to decreased oral intake, will start him on aggressive IV fluid hydration recheck in the morning.  Hypovolemic hyponatremia Likely prerenal in etiology we will bolus him IV fluids and recheck in the morning   Hypercalcemia Hypercalcemia with anemia, and a protein albumin dissociation will get a bone survey also in the differential could be dehydration we will start him on IV fluids recheck in the morning. Also get a bone survey.  Weight loss It is likely due to his chronic odynophagia which he relates he has been trying to avoid eating due to the pain. Unlikely to be a malignancy.  Thrombocytosis ([place]) Likely reactive check in the morning.   DVT prophylaxis: Scd, due to the high risk of bleeding due to his esophagitis Code Status: DNR Family Communication:  Disposition Plan:  Consults called: Gi Admission status: inpatient   Charlynne Cousins MD [facility] Pager [phone]  If 7PM-7AM, please contact night-coverage [email or web address] Password [email or web address]  06/05/2017, 9:10 AM

## 2017-06-06 ENCOUNTER — Ambulatory Visit: Payer: Self-pay | Admitting: Family Medicine

## 2017-06-06 ENCOUNTER — Other Ambulatory Visit: Payer: Self-pay

## 2017-06-06 ENCOUNTER — Inpatient Hospital Stay (HOSPITAL_COMMUNITY): Payer: Self-pay | Admitting: Certified Registered"

## 2017-06-06 ENCOUNTER — Encounter (HOSPITAL_COMMUNITY)
Admission: EM | Disposition: A | Discharge status: Home / Self Care | Payer: Self-pay | Source: Home / Self Care | Attending: Internal Medicine

## 2017-06-06 ENCOUNTER — Encounter (HOSPITAL_COMMUNITY): Payer: Self-pay | Admitting: Certified Registered"

## 2017-06-06 DIAGNOSIS — R1312 Dysphagia, oropharyngeal phase: Secondary | ICD-10-CM

## 2017-06-06 DIAGNOSIS — K222 Esophageal obstruction: Secondary | ICD-10-CM

## 2017-06-06 DIAGNOSIS — R410 Disorientation, unspecified: Secondary | ICD-10-CM

## 2017-06-06 DIAGNOSIS — K2210 Ulcer of esophagus without bleeding: Principal | ICD-10-CM

## 2017-06-06 HISTORY — PX: ESOPHAGOGASTRODUODENOSCOPY: SHX5428

## 2017-06-06 LAB — BASIC METABOLIC PANEL
ANION GAP: 6 (ref 5–15)
BUN: 17 mg/dL (ref 6–20)
CO2: 24 mmol/L (ref 22–32)
Calcium: 9.2 mg/dL (ref 8.9–10.3)
Chloride: 102 mmol/L (ref 101–111)
Creatinine, Ser: 1.08 mg/dL (ref 0.61–1.24)
GFR calc Af Amer: >60 mL/min (ref >60)
GLUCOSE: 90 mg/dL (ref 65–99)
POTASSIUM: 3.7 mmol/L (ref 3.5–5.1)
Sodium: 132 mmol/L — ABNORMAL LOW (ref 135–145)

## 2017-06-06 LAB — CBC
HEMATOCRIT: 32.5 % — AB (ref 39.0–52.0)
Hemoglobin: 11 g/dL — ABNORMAL LOW (ref 13.0–17.0)
MCH: 30.6 pg (ref 26.0–34.0)
MCHC: 33.8 g/dL (ref 30.0–36.0)
MCV: 90.3 fL (ref 78.0–100.0)
PLATELETS: 246 10*3/uL (ref 150–400)
RBC: 3.6 MIL/uL — AB (ref 4.22–5.81)
RDW: 12.8 % (ref 11.5–15.5)
WBC: 9.7 10*3/uL (ref 4.0–10.5)

## 2017-06-06 LAB — HIV ANTIBODY (ROUTINE TESTING W REFLEX): HIV Screen 4th Generation wRfx: NONREACTIVE

## 2017-06-06 SURGERY — EGD (ESOPHAGOGASTRODUODENOSCOPY)
Anesthesia: Monitor Anesthesia Care

## 2017-06-06 MED ORDER — PROPOFOL 10 MG/ML IV BOLUS
INTRAVENOUS | Status: AC
  Filled 2017-06-06: qty 20

## 2017-06-06 MED ORDER — LACTATED RINGERS IV SOLN
INTRAVENOUS | Status: DC
  Administered 2017-06-06: 1000 mL via INTRAVENOUS

## 2017-06-06 MED ORDER — PANTOPRAZOLE SODIUM 40 MG PO PACK
40.0000 mg | PACK | Freq: Two times a day (BID) | ORAL | Status: DC
  Administered 2017-06-06 – 2017-06-09 (×7): 40 mg via ORAL
  Filled 2017-06-06 (×7): qty 20

## 2017-06-06 MED ORDER — PROPOFOL 10 MG/ML IV BOLUS
INTRAVENOUS | Status: DC | PRN
  Administered 2017-06-06 (×3): 20 mg via INTRAVENOUS

## 2017-06-06 MED ORDER — LORAZEPAM 1 MG PO TABS: 1.0000 mg | ORAL_TABLET | Freq: Four times a day (QID) | ORAL | Status: DC | PRN

## 2017-06-06 MED ORDER — ONDANSETRON HCL 4 MG/2ML IJ SOLN
INTRAMUSCULAR | Status: DC | PRN
  Administered 2017-06-06: 4 mg via INTRAVENOUS

## 2017-06-06 MED ORDER — PROPOFOL 500 MG/50ML IV EMUL
INTRAVENOUS | Status: DC | PRN
  Administered 2017-06-06: 100 ug/kg/min via INTRAVENOUS

## 2017-06-06 MED ORDER — BOOST PLUS PO LIQD
237.0000 mL | Freq: Two times a day (BID) | ORAL | Status: DC
  Administered 2017-06-07 – 2017-06-08 (×3): 237 mL via ORAL
  Filled 2017-06-06 (×7): qty 237

## 2017-06-06 MED ORDER — FLUCONAZOLE IN SODIUM CHLORIDE 200-0.9 MG/100ML-% IV SOLN: 200.0000 mg | INTRAVENOUS | Status: DC

## 2017-06-06 NOTE — Progress Notes (Signed)
Initial Nutrition Assessment  DOCUMENTATION CODES:   Severe malnutrition in context of chronic illness, Underweight  INTERVENTION:   Monitor magnesium, potassium, and phosphorus daily for at least 3 days, MD to replete as needed, as pt is at risk for refeeding syndrome given severe malnutrition and poor PO intake x 2 months.   Provide Boost Plus chocolate BID- Each supplement provides 360kcal and 14g protein.    TF recommendations: Initiate Osmolite 1.5 @ 20 ml/hr, advance by 10 ml every 12 hours to goal rate of 60 ml/hr. This will provide 2160 kcal, 90g protein and  1097 ml H2O.  Will monitor for plan regarding nutrition support.  NUTRITION DIAGNOSIS:   Severe Malnutrition related to chronic illness(severe esophagitis, esophageal stenosis) as evidenced by percent weight loss, energy intake < or equal to 75% for > or equal to 1 month, moderate fat depletion, severe muscle depletion.  GOAL:   Patient will meet greater than or equal to 90% of their needs  MONITOR:   PO intake, Supplement acceptance, Skin, Labs, Weight trends  REASON FOR ASSESSMENT:   Malnutrition Screening Tool    ASSESSMENT:    67 year old male with a medical history significant for past alcohol abuse and GERD with severe esophagitis who presented to the ED on 06/04/2017 via EMS per his PCP due to extreme confusion where he was only oriented to person and place 2/27: s/p EGD -per chart review showed severe esophagitis and stenosis. MD recommending alternative nutrition.  Pt in room with no family at bedside. Pt was able to answer RD's questions. Pt reports for the last 2 months he has started to have issues with his swallowing. It worsened to where he could not swallow solid foods. He's been eating mostly liquids. States he last ate yesterday when he had pudding, ice cream and juice. Pt now on a full liquid diet following EGD. Pt does not like Ensure supplements but would prefer Boost. Will order Boost  Plus.  Provide recommendations for tube feeding above.   Per chart review, pt has lost 25 lb since 1/7 (19% wt loss x 2 months, significant for time frame).   Labs reviewed. Medications: Folic acid tablet daily, MAG-OX tablet daily, Multivitamin with minerals daily, Thiamine tablet daily, D5 -.45% NaCl infusion at 100 ml/hr -provides 408 kcal  NUTRITION - FOCUSED PHYSICAL EXAM:    Most Recent Value  Orbital Region  Mild depletion  Upper Arm Region  Severe depletion  Thoracic and Lumbar Region  Unable to assess  Buccal Region  Moderate depletion  Temple Region  Severe depletion  Clavicle Bone Region  Severe depletion  Clavicle and Acromion Bone Region  Severe depletion  Scapular Bone Region  Unable to assess  Dorsal Hand  Moderate depletion  Patellar Region  Moderate depletion  Anterior Thigh Region  Unable to assess  Posterior Calf Region  Moderate depletion  Edema (RD Assessment)  None       Diet Order:  Diet full liquid Room service appropriate? Yes; Fluid consistency: Thin  EDUCATION NEEDS:   Education needs have been addressed  Skin:  Skin Assessment: Skin Integrity Issues: Skin Integrity Issues:: Stage I Stage I: buttocks  Last BM:  PTA  Height:   Ht Readings from Last 1 Encounters:  06/06/17 5\' 7"  (1.702 m)    Weight:   Wt Readings from Last 1 Encounters:  06/06/17 105 lb (47.6 kg)    Ideal Body Weight:  67.3 kg  BMI:  Body mass index is 16.45  kg/m.  Estimated Nutritional Needs:   Kcal:  2000-2200  Protein:  100-110g  Fluid:  2L/day  Clayton Bibles, MS, RD, LDN [place] [facility] Pager: [phone] After Hours Pager: [phone]

## 2017-06-06 NOTE — Progress Notes (Addendum)
PROGRESS NOTE    Patrick [name]  ZYS:[id number] DOB: February 08, 1951 DOA: 06/05/2017 PCP: [name], MD    Brief Narrative:  67 year old male who was referred for admission due to significant weight loss and deconditioning.  Does have a significant past medical history of alcohol abuse, Candida esophagitis.  Patient complaining of dysphagia and odynophagia, associated with significant weight loss and deconditioning.  On the initial physical examination blood pressure 101/78, heart rate 110, respiratory 14, temperature 97.4, oxygen saturation 92%.  Moist mucous membranes, lungs clear to auscultation bilaterally, heart S1-S2 present rhythmic, abdomen soft nontender, no lower extremity edema.  Sodium 133, potassium 5.1, chloride 90, bicarb 24, glucose 90, BUN 28, creatinine 1.80, white count 10.2, hemoglobin 13.5, hematocrit 39.9, platelets 445.  Urinalysis negative for infection.  Drug screen positive for benzodiazepines and tetrahydrocannabinol.  Head CT negative for acute changes.  Brain MRI negative for acute changes.  Chest x-ray with hyperinflated lung fields, no infiltrates.  EKG normal sinus rhythm, normal axis, normal intervals.  Right pelvic films no acute fractures.  Patient was admitted to the hospital can diagnosis acute metabolic encephalopathy related to odynophagia/dysphasia, poor oral intake, and acute kidney injury.   Assessment & Plan:   Active Problems:   Hypertension   Hyponatremia   AKI (acute kidney injury) ([place])   DNR (do not resuscitate)   Acute encephalopathy   Hypercalcemia   Weight loss   Thrombocytosis (H[facility])   Encephalopathy acute   Encephalopathy   1.  Metabolic encephalopathy. This am patient is awake and alert, no confusion or agitation, will consult physical therapy, po diet as tolerated.   2.  Dysphasia/odynophagia. Severe esophagitis with esophageal stenosis, will continue aggressive antiacid therapy, will need nutritional support. For now continue  clear liquid diet, follow on esophagogram, discussed with GI. No signs of candida, will hold on fluconazole for now, follow on biopsies.   3.  Acute kidney injury with dehydration/hyponatremia/hypercalcemia. Continue hydration with IV fluids, will follow on renal panel. Avoid hypotension or nephrotoxic medications.   4.  Deconditioning. Follow with physical therapy, may need snf at discharge.   5. Alcohol abuse. No signs of withdrawal, will hold on protocol and will add as needed po lorazepam.    DVT prophylaxis: scd  Code Status:  full Family Communication:  No family at the bedside Disposition Plan: home/ snf   Consultants:   GI   Procedures:   Endoscopy  Antimicrobials:       Subjective: Patient is feeling deconditioned and weak, no nausea or vomiting, no chest pain or dysnea.   Objective: Vitals:   06/05/17 1703 06/05/17 2135 06/05/17 2354 06/06/17 0606  BP: 115/80 103/61 (!) 88/59 103/72  Pulse: 87 88 88 90  Resp: 18 16 16 17   Temp: 97.8 F (36.6 C) 98 F (36.7 C) 98.2 F (36.8 C) 97.8 F (36.6 C)  TempSrc: Oral Oral Oral Oral  SpO2: 100% 100% 100% 100%    Intake/Output Summary (Last 24 hours) at 06/06/2017 9323 Last data filed at 06/06/2017 0300 Gross per 24 hour  Intake 800 ml  Output -  Net 800 ml   There were no vitals filed for this visit.  Examination:   General: Not in pain or dyspnea, deconditioned.  Neurology: Awake and alert, non focal  E ENT: mild pallor, no icterus, oral mucosa moist Cardiovascular: No JVD. S1-S2 present, rhythmic, no gallops, rubs, or murmurs. No lower extremity edema. Pulmonary: decreased breath sounds bilaterally at bases, adequate air movement, no wheezing,  rhonchi or rales.  Gastrointestinal. Abdomen flat, no organomegaly, non tender, no rebound or guarding Skin. No rashes Musculoskeletal: no joint deformities     Data Reviewed: I have personally reviewed following labs and imaging studies  CBC: Recent Labs    Lab 06-21-2017 1629 06/05/17 0217 06/06/17 0556  WBC 9.8 10.2 9.7  NEUTROABS  --  6.4  --   HGB 13.8 13.5 11.0*  HCT 40.8 39.9 32.5*  MCV 92.1 93.2 90.3  PLT 470* 445* 024   Basic Metabolic Panel: Recent Labs  Lab 06/21/17 1629 06/05/17 0217 06/06/17 0556  NA 134* 133* 132*  K 4.0 5.1 3.7  CL 94* 99* 102  CO2 26 24 24   GLUCOSE 116* 90 90  BUN 28* 28* 17  CREATININE 1.70* 1.80* 1.08  CALCIUM 10.5* 9.6 9.2  MG  --  1.9  --    GFR: Estimated Creatinine Clearance: 46.5 mL/min (by C-G formula based on SCr of 1.08 mg/dL). Liver Function Tests: Recent Labs  Lab 21-Jun-2017 1629 06/05/17 0217  AST 47* 55*  ALT 20 14*  ALKPHOS 216* 176*  BILITOT 0.9 1.0  PROT 8.7* 6.7  ALBUMIN 3.7 2.9*   Recent Labs  Lab 2017/06/21 1629  LIPASE 25   Recent Labs  Lab 06/05/17 0217  AMMONIA 34   Coagulation Profile: No results for input(s): INR, PROTIME in the last 168 hours. Cardiac Enzymes: No results for input(s): CKTOTAL, CKMB, CKMBINDEX, TROPONINI in the last 168 hours. BNP (last 3 results) No results for input(s): PROBNP in the last 8760 hours. HbA1C: No results for input(s): HGBA1C in the last 72 hours. CBG: No results for input(s): GLUCAP in the last 168 hours. Lipid Profile: No results for input(s): CHOL, HDL, LDLCALC, TRIG, CHOLHDL, LDLDIRECT in the last 72 hours. Thyroid Function Tests: Recent Labs    06/05/17 1336  TSH 0.909   Anemia Panel: Recent Labs    06/05/17 1336  [place]      Radiology Studies: I have reviewed all of the imaging during this hospital visit personally     Scheduled Meds: . feeding supplement (ENSURE ENLIVE)  237 mL Oral BID BM  . folic acid  1 mg Oral Daily  . Gerhardt's butt cream  1 application Topical TID  . LORazepam  0-4 mg Oral Q6H   Followed by  . [START ON 06/07/2017] LORazepam  0-4 mg Oral Q12H  . magnesium oxide  400 mg Oral Daily  . multivitamin with minerals  1 tablet Oral Daily  . nicotine  21 mg  Transdermal Daily  . pantoprazole (PROTONIX) IV  40 mg Intravenous Q12H  . sodium polystyrene  15 g Oral Once  . sucralfate  1 g Oral TID WC & HS  . thiamine  100 mg Oral Daily   Or  . thiamine  100 mg Intravenous Daily  . traZODone  50 mg Oral QHS   Continuous Infusions: . dextrose 5 % and 0.45% NaCl 100 mL/hr at 06/06/17 0507  . fluconazole (DIFLUCAN) IV 100 mg (06/06/17 0906)  . sodium chloride       LOS: 1 day        Tawni Millers, MD [facility] Pager [phone]

## 2017-06-06 NOTE — Progress Notes (Signed)
Per Dr. Hilarie Fredrickson, okay for patient to be on a full liquid diet.  Will change orders.

## 2017-06-06 NOTE — Anesthesia Preprocedure Evaluation (Signed)
Anesthesia Evaluation  Patient identified by MRN, date of birth, ID band Patient awake    Reviewed: Allergy & Precautions, NPO status , Patient's Chart, lab work & pertinent test results  History of Anesthesia Complications Negative for: history of anesthetic complications  Airway Mallampati: II  TM Distance: >3 FB Neck ROM: Full    Dental  (+) Poor Dentition, Dental Advisory Given   Pulmonary Current Smoker,    Pulmonary exam normal breath sounds clear to auscultation       Cardiovascular hypertension, Pt. on medications negative cardio ROS Normal cardiovascular exam Rhythm:Regular Rate:Normal     Neuro/Psych PSYCHIATRIC DISORDERS Depression negative neurological ROS     GI/Hepatic PUD, GERD  ,(+)     substance abuse  alcohol use,   Endo/Other  negative endocrine ROS  Renal/GU negative Renal ROS  negative genitourinary   Musculoskeletal negative musculoskeletal ROS (+)   Abdominal   Peds negative pediatric ROS (+)  Hematology negative hematology ROS (+)   Anesthesia Other Findings   Reproductive/Obstetrics negative OB ROS                             Anesthesia Physical  Anesthesia Plan  ASA: III  Anesthesia Plan: MAC   Post-op Pain Management:    Induction:   PONV Risk Score and Plan: 0 and Ondansetron and Propofol infusion  Airway Management Planned: Nasal Cannula  Additional Equipment:   Intra-op Plan:   Post-operative Plan:   Informed Consent: I have reviewed the patients History and Physical, chart, labs and discussed the procedure including the risks, benefits and alternatives for the proposed anesthesia with the patient or authorized representative who has indicated his/her understanding and acceptance.   Dental advisory given  Plan Discussed with: CRNA  Anesthesia Plan Comments:         Anesthesia Quick Evaluation

## 2017-06-06 NOTE — Progress Notes (Signed)
Patient had some confusion over night per the night RN. This RN went in to evaluate patient. Patient answered orientation questions appropriately. Felt he was oriented to sign consent for EGD this morning.

## 2017-06-06 NOTE — Evaluation (Addendum)
Physical Therapy Evaluation Patient Details Name: Patrick [name] MRN: [id number] DOB: 1950-08-14 Today's Date: 06/06/2017   History of Present Illness  67 year old male who was referred for admission due to significant weight loss and deconditioning.  Does have a significant past medical history of alcohol abuse, Candida esophagitis.  Patient complaining of dysphagia and odynophagia, associated with significant weight loss and deconditioning.  Clinical Impression  Patient presents with decreased independence with mobility due to deficits listed in PT problem list.  Currently at high risk to fall due to weakness and limited activity tolerance.  Feel would be safest at SNF level rehab, but pt refusing so would need maximum HH services (PT; aide) if agreeable.  PT to follow acutely.    Follow Up Recommendations SNF    Equipment Recommendations  None recommended by PT    Recommendations for Other Services       Precautions / Restrictions Precautions Precautions: Fall Precaution Comments: admits to multiple falls      Mobility  Bed Mobility Overal bed mobility: Modified Independent                Transfers Overall transfer level: Needs assistance Equipment used: None Transfers: Sit to/from Stand Sit to Stand: Min guard         General transfer comment: for balance/safety  Ambulation/Gait Ambulation/Gait assistance: Min guard;Min assist Ambulation Distance (Feet): 60 Feet Assistive device: None(vs pushing IV pole) Gait Pattern/deviations: Step-to pattern;Step-through pattern;Wide base of support     General Gait Details: unsteady on his feet, but compensating reaching for wall, etc  Stairs            Wheelchair Mobility    Modified Rankin (Stroke Patients Only)       Balance Overall balance assessment: Needs assistance Sitting-balance support: Feet supported Sitting balance-Leahy Scale: Good       Standing balance-Leahy Scale: Fair Standing  balance comment: static standing with S                             Pertinent Vitals/Pain Pain Assessment: Faces Faces Pain Scale: Hurts little more Pain Location: generalizeed (all over) Pain Descriptors / Indicators: Constant Pain Intervention(s): Monitored during session;Repositioned    Home Living Family/patient expects to be discharged to:: Private residence Living Arrangements: Children(son) Available Help at Discharge: Family;Available PRN/intermittently Type of Home: House Home Access: Stairs to enter Entrance Stairs-Rails: Right Entrance Stairs-Number of Steps: 3 Home Layout: Two level;Bed/bath upstairs Home Equipment: Walker - 2 wheels;Bedside commode      Prior Function Level of Independence: Needs assistance   Gait / Transfers Assistance Needed: reports has been limping around the house without a device  ADL's / Homemaking Assistance Needed: can't stand long enough to shower, sits to sponge bathe  Comments: reports son is mean to him and that MD is wanting son to be gone and more help "healthcare" in the home     Hand Dominance   Dominant Hand: Right    Extremity/Trunk Assessment   Upper Extremity Assessment Upper Extremity Assessment: Generalized weakness    Lower Extremity Assessment Lower Extremity Assessment: Generalized weakness       Communication   Communication: No difficulties  Cognition  General Comments General comments (skin integrity, edema, etc.): pt cachectic     Exercises     Assessment/Plan    PT Assessment Patient needs continued PT services  PT Problem List Decreased strength;Decreased mobility;Decreased safety awareness;Decreased activity tolerance;Decreased balance;Decreased knowledge of use of DME       PT Treatment Interventions DME instruction;Functional mobility training;Balance training;Gait training;Therapeutic activities;Patient/family  education;Stair training;Therapeutic exercise    PT Goals (Current goals can be found in the Care Plan section)  Acute Rehab PT Goals Patient Stated Goal: To go home PT Goal Formulation: With patient Time For Goal Achievement: 06/20/17 Potential to Achieve Goals: Fair    Frequency Min 3X/week   Barriers to discharge Decreased caregiver support      Co-evaluation               AM-PAC PT "6 Clicks" Daily Activity  Outcome Measure Difficulty turning over in bed (including adjusting bedclothes, sheets and blankets)?: A Little Difficulty moving from lying on back to sitting on the side of the bed? : A Little Difficulty sitting down on and standing up from a chair with arms (e.g., wheelchair, bedside commode, etc,.)?: Unable Help needed moving to and from a bed to chair (including a wheelchair)?: A Little Help needed walking in hospital room?: A Little Help needed climbing 3-5 steps with a railing? : A Lot 6 Click Score: 15    End of Session Equipment Utilized During Treatment: Gait belt Activity Tolerance: Patient limited by fatigue Patient left: in bed;with call bell/phone within reach;with bed alarm set   PT Visit Diagnosis: Muscle weakness (generalized) (M62.81);History of falling (Z91.81);Other abnormalities of gait and mobility (R26.89);Unsteadiness on feet (R26.81)    Time: 9794-8016 PT Time Calculation (min) (ACUTE ONLY): 21 min   Charges:   PT Evaluation $PT Eval Moderate Complexity: 1 Mod     PT G CodesMagda Kiel, [place] [phone] 06/06/2017   [name] 06/06/2017, 4:51 PM

## 2017-06-06 NOTE — Progress Notes (Addendum)
Progress Note   Patrick [name] RZN:[id number] DOB: 1951/02/04 DOA: 06/05/2017 PCP: [name], MD   LOS: 1 day    Brief Narrative:  Patrick [name] is a 67 year old male with a medical history significant for past alcohol abuse and GERD with severe esophagitis who presented to the ED on 06/04/2017 via EMS per his PCP due to extreme confusion where he was only oriented to person and place. Upon initial presentation to the ED, patient was complaining of weight loss of 40 pounds in 40 days, problems swallowing both solids and liquids associated with pain, constant regurgitation of food due to reflux, right leg pain/weakness, and emesis. In the ED, patient was afebrile, HR 94-110, RR 14-18, BP 93/69-101/78, O2 sat 92-100% on room air. Initial labs in the ED demonstrated normal CBC except for elevated platelets of 470, BUN 28, creatinine 1.7, GFR 41, total protein 8.7, AST 47, Alk phos 216, Calcium 10.5, Chloride 94, sodium 134, ammonia 34. CT demonstrated no acute intracranial abnormality with moderate brain atrophy of frontal/parietal lobes. CXR showed small to moderate hiatal hernia. X-ray of femur demonstrated osteopenia and osteoarthritis changes of hips. MRI illustrated cerebral volume loss with disproportionate bilateral frontoparietal involvement and mild to moderate for age nonspecific signal changes in the cerebral white matter/pons.   Patient was admitted on the working diagnosis of acute encephalopathy which caused acute kidney injury and dehydration due to severe dysphagia.   Assessment/Plan:   Active Problems:   Hypertension   Hyponatremia   AKI (acute kidney injury) ([place])   DNR (do not resuscitate)   Acute encephalopathy   Hypercalcemia   Weight loss   Thrombocytosis (HCC)   Encephalopathy acute   Encephalopathy   1. Acute encephalopathy: Multifactoral etiology. 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 Patient was oriented x3 today and seemed to have improved mentation per nurse. On admission, patient had an elevated sed rate of 58.  -monitor trend of electrolytes, renal function, and liver enzymes with CMP tomorrow am  2. Odynophagia caused by ulcerative esophagitis: GI was consulted and EGD was performed this morning which demonstrated severe circumferential ulcerative esophagitis which was biopsied and severe esophageal stenosis that could not be passed with pediatric upper endoscopy.   -Discontinue fluconazole due to no candidiasis esophagitis -Clear liquid diet -Liquid PPI BID per GI due to severe stenosis and inability to swallow pills -Liquid sucralfate QID -Barium esophagram to determine length of stenosis -alternative nutrition to meet caloric needs such as TPN or PEG because patient is not a candidate for stent placement  3. Acute kidney injury: AKI has resolved. Current labs demonstrate BUN 17 and Creatinine 1.08  4. Hypovolemic hyponatremia: Sodium levels have decrease and are now 132. Continue to monitor sodium levels -continue IVFs  5. Hypercalcemia: Calcium levels have returned to baseline and were 9.2 this morning  6. Significant weight loss: Patient complains of 40 pound weight loss over the past 40 days most likely due to problems swallowing and associative pain -treatment plan for ulcerative esophagitis as noted above. Consider alternative nutrition to meet caloric needs -Await biopsy results for further treatment plan  7. Nicotine Abuse: Continue nicoderm cq q24h   Family Communication/Anticipated D/C date and plan/Code Status   DVT prophylaxis: SCDs Code Status: Full Family Communication: No family present at bedside Disposition Plan: Home vs. SNF depending on PT evaluation   Medical Consultants:  GI    Antimicrobials:  Subjective:  Patient states he is still having significant trouble swallowing, both solids and liquids, with  associative pain. He states that he typically regurgitates his food due to the inability to swallow which has been going on for about 1-2 months. He is also complaining of right sided weakness, mostly lower extremity that has not improved throughout his admission. Patient denies speech changes, vision changes, chest pain, or SOB.   Objective:   Vitals:   06/05/17 2354 06/06/17 0606 06/06/17 0951 06/06/17 1149  BP: (!) 88/59 103/72 109/75 106/73  Pulse: 88 90 82 76  Resp: 16 17 16 15   Temp: 98.2 F (36.8 C) 97.8 F (36.6 C) 97.6 F (36.4 C) (!) 97.4 F (36.3 C)  TempSrc: Oral Oral Oral Oral  SpO2: 100% 100% 97% 100%  Weight:   47.6 kg (105 lb)   Height:   5' 7"  (1.702 m)     Intake/Output Summary (Last 24 hours) at 06/06/2017 1205 Last data filed at 06/06/2017 1144 Gross per 24 hour  Intake 1300 ml  Output 0 ml  Net 1300 ml   Filed Weights   06/06/17 0951  Weight: 47.6 kg (105 lb)     Physical Exam:   Constitutional: NAD, appeared malnourished and frail Eyes: PERRL, lids and conjunctivae normal ENMT: Mucous membranes are moist.  Neck: normal, supple, no masses Respiratory: clear to auscultation bilaterally, no wheezing, no crackles. Normal respiratory effort. No accessory muscle use.  Cardiovascular: Regular rate and rhythm, normal S1-S2 with diminished heart sounds, no murmurs / rubs / gallops. No LE edema.   Abdomen: soft, nontender and nondistended.  Musculoskeletal: no clubbing / cyanosis. No joint deformity upper and lower extremities. Normal muscle tone.  Skin: no rashes, lesions, ulcers. No induration Neurologic: CN 2-12 grossly intact. Strength 4/5 in all 4. Grip strength equal bilaterally. Gait not assessed. Resting tremor noted bilaterally Psychiatric: Alert and oriented x 3    Data Reviewed:   I have independently reviewed following labs and imaging studies:   CBC: Recent Labs  Lab 06-18-17 1629 06/05/17 0217 06/06/17 0556  WBC 9.8 10.2 9.7    NEUTROABS  --  6.4  --   HGB 13.8 13.5 11.0*  HCT 40.8 39.9 32.5*  MCV 92.1 93.2 90.3  PLT 470* 445* 409   Basic Metabolic Panel: Recent Labs  Lab 2017-06-18 1629 06/05/17 0217 06/06/17 0556  NA 134* 133* 132*  K 4.0 5.1 3.7  CL 94* 99* 102  CO2 26 24 24   GLUCOSE 116* 90 90  BUN 28* 28* 17  CREATININE 1.70* 1.80* 1.08  CALCIUM 10.5* 9.6 9.2  MG  --  1.9  --    GFR: Estimated Creatinine Clearance: 46.5 mL/min (by C-G formula based on SCr of 1.08 mg/dL). Liver Function Tests: Recent Labs  Lab 06-18-2017 1629 06/05/17 0217  AST 47* 55*  ALT 20 14*  ALKPHOS 216* 176*  BILITOT 0.9 1.0  PROT 8.7* 6.7  ALBUMIN 3.7 2.9*   Recent Labs  Lab 18-Jun-2017 1629  LIPASE 25   Recent Labs  Lab 06/05/17 0217  AMMONIA 34   Coagulation Profile: No results for input(s): INR, PROTIME in the last 168 hours. Cardiac Enzymes: No results for input(s): CKTOTAL, CKMB, CKMBINDEX, TROPONINI in the last 168 hours. BNP (last 3 results) No results for input(s): PROBNP in the last 8760 hours. HbA1C: No results for input(s): HGBA1C in the last 72 hours. CBG: No results for input(s): GLUCAP in the last 168 hours. Lipid Profile: No results  for input(s): CHOL, HDL, LDLCALC, TRIG, CHOLHDL, LDLDIRECT in the last 72 hours. Thyroid Function Tests: Recent Labs    06/05/17 1336  TSH 0.909   Anemia Panel: Recent Labs    06/05/17 1336  VITAMINB12 383   Urine analysis:    Component Value Date/Time   COLORURINE YELLOW 06/05/2017 [place] 06/05/2017 1632   LABSPEC 1.018 06/05/2017 1632   PHURINE 5.0 06/05/2017 1632   GLUCOSEU NEGATIVE 06/05/2017 1632   HGBUR NEGATIVE 06/05/2017 1632   HGBUR negative 02/21/2010 [place] 06/05/2017 1632   BILIRUBINUR n 03/11/2014 1025   KETONESUR NEGATIVE 06/05/2017 1632   PROTEINUR NEGATIVE 06/05/2017 1632   UROBILINOGEN 0.2 03/11/2014 1025   UROBILINOGEN 0.2 02/21/2010 1055   NITRITE NEGATIVE 06/05/2017 1632    LEUKOCYTESUR NEGATIVE 06/05/2017 1632   Sepsis Labs: Invalid input(s): PROCALCITONIN, LACTICIDVEN  No results found for this or any previous visit (from the past 240 hour(s)).    Radiology Studies: Dg Chest 2 View  Result Date: 06/05/2017 CLINICAL DATA:  66 y/o M; confusion, dehydration, and difficulty walking. Altered mental status. EXAM: CHEST  2 VIEW COMPARISON:  04/20/2017 chest radiograph.  11/29/2016 CT chest. FINDINGS: Normal cardiac silhouette. Aortic atherosclerosis with calcification. Small to moderate hiatal hernia. Emphysema hyperinflated lungs compatible with COPD. No focal consolidation. No pleural effusion or pneumothorax. No acute osseous abnormality is evident. IMPRESSION: Small to moderate hiatal hernia. COPD. No acute pulmonary process identified. Electronically Signed   By: Kristine Garbe M.D.   On: 06/05/2017 02:06   Ct Head Wo Contrast  Result Date: 06/05/2017 CLINICAL DATA:  67 y/o  M; confusion and weakness. EXAM: CT HEAD WITHOUT CONTRAST TECHNIQUE: Contiguous axial images were obtained from the base of the skull through the vertex without intravenous contrast. COMPARISON:  None. FINDINGS: Brain: No evidence of acute infarction, hemorrhage, hydrocephalus, extra-axial collection or mass lesion/mass effect. Scattered nonspecific foci of hypoattenuation in subcortical and periventricular white matter compatible with moderate chronic microvascular ischemic changes for age and there is moderate frontal parietal predominance brain parenchymal volume loss. Vascular: Mild calcific atherosclerosis of carotid siphons. No hyperdense vessel. Skull: Normal. Negative for fracture or focal lesion. Sinuses/Orbits: No acute finding. Other: None. IMPRESSION: 1. No acute intracranial abnormality identified. 2. Moderate for age chronic microvascular ischemic changes. Moderate brain atrophy with frontal and parietal predominance. Electronically Signed   By: Kristine Garbe M.D.    On: 06/05/2017 02:03   Mr Brain Wo Contrast  Result Date: 06/05/2017 CLINICAL DATA:  67 year old male with confusion and altered mental status since yesterday. EXAM: MRI HEAD WITHOUT CONTRAST TECHNIQUE: Multiplanar, multiecho pulse sequences of the brain and surrounding structures were obtained without intravenous contrast. COMPARISON:  Head CT without contrast 0151 hours today. FINDINGS: Brain: No restricted diffusion to suggest acute infarction. No midline shift, mass effect, evidence of mass lesion, ventriculomegaly, extra-axial collection or acute intracranial hemorrhage. Cervicomedullary junction and pituitary are within normal limits. Cerebral volume loss over that expected for age. There seems to be disproportionate atrophy of the frontal and parietal lobes (series 10, image 14). No cortical encephalomalacia identified. No chronic cerebral blood products. Mild to moderate for age patchy periventricular, splenium corpus callosum, and pontine nonspecific T2 and FLAIR hyperintensity. The deep gray matter nuclei and cerebellum appear within normal limits for age. Vascular: Major intracranial vascular flow voids are preserved. Skull and upper cervical spine: Normal visible cervical spine. Normal bone marrow signal. Sinuses/Orbits: Postoperative changes to both globes, otherwise normal orbits soft tissues. Paranasal  sinuses are clear. Other: Visible internal auditory structures appear normal. Mastoids are clear. Scalp and face soft tissues appear negative. IMPRESSION: 1.  No acute intracranial abnormality. 2. Cerebral volume loss with disproportionate bilateral frontoparietal involvement. 3. Mild to moderate for age nonspecific signal changes in the cerebral white matter and pons, most commonly due to chronic small vessel disease. Electronically Signed   By: Genevie Ann M.D.   On: 06/05/2017 07:16   Dg Hip Unilat W Or Wo Pelvis 2-3 Views Right  Result Date: 06/05/2017 CLINICAL DATA:  67 year old male with right  hip pain. EXAM: DG HIP (WITH OR WITHOUT PELVIS) 2-3V RIGHT; RIGHT FEMUR 2 VIEWS COMPARISON:  Abdominal CT dated 01/04/2017. FINDINGS: There is no acute fracture or dislocation. 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 There is narrowing of the inferior aspect of the right femoroacetabular joint space. The bones are osteopenic. The soft tissues appear unremarkable. IMPRESSION: 1. No acute fracture or dislocation. 2. Osteopenia and osteoarthritic changes of the hips. Electronically Signed   By: Anner Crete M.D.   On: 06/05/2017 02:02   Dg Femur Min 2 Views Right  Result Date: 06/05/2017 CLINICAL DATA:  67 year old male with right hip pain. EXAM: DG HIP (WITH OR WITHOUT PELVIS) 2-3V RIGHT; RIGHT FEMUR 2 VIEWS COMPARISON:  Abdominal CT dated 01/04/2017. FINDINGS: There is no acute fracture or dislocation. 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 There is narrowing of the inferior aspect of the right femoroacetabular joint space. The bones are osteopenic. The soft tissues appear unremarkable. IMPRESSION: 1. No acute fracture or dislocation. 2. Osteopenia and osteoarthritic changes of the hips. Electronically Signed   By: Anner Crete M.D.   On: 06/05/2017 02:02      Medication:   . [MAR Hold] feeding supplement (ENSURE ENLIVE)  237 mL Oral BID BM  . [MAR Hold] folic acid  1 mg Oral Daily  . [MAR Hold] Gerhardt's butt cream  1 application Topical TID  . [MAR Hold] magnesium oxide  400 mg Oral Daily  . [MAR Hold] multivitamin with minerals  1 tablet Oral Daily  . [MAR Hold] nicotine  21 mg Transdermal Daily  . [MAR Hold] pantoprazole (PROTONIX) IV  40 mg Intravenous Q12H  . [MAR Hold] sodium polystyrene  15 g Oral Once  . [MAR Hold] sucralfate  1 g Oral TID WC & HS  . [MAR Hold] thiamine  100 mg Oral Daily   Or  . [MAR Hold] thiamine  100 mg Intravenous Daily  . [MAR Hold] traZODone  50 mg Oral QHS    Continuous  Infusions: . dextrose 5 % and 0.45% NaCl 100 mL/hr at 06/06/17 0507  . [MAR Hold] fluconazole (DIFLUCAN) IV    . lactated ringers 1,000 mL (06/06/17 1109)  . [MAR Hold] sodium chloride        Time spent: 25 minutes  Signed, Lanna Poche, PA-S [employer] Email: [email or web address]

## 2017-06-06 NOTE — Interval H&P Note (Signed)
History and Physical Interval Note: For EGD this morning to eval odynophagia, weight loss and history of severe esophagitis The nature of the procedure, as well as the risks, benefits, and alternatives were carefully and thoroughly reviewed with the patient. Ample time for discussion and questions allowed. The patient understood, was satisfied, and agreed to proceed.    06/06/2017 10:56 AM  Patrick [name]  has presented today for surgery, with the diagnosis of odynophagia and weight loss  The various methods of treatment have been discussed with the patient and family. After consideration of risks, benefits and other options for treatment, the patient has consented to  Procedure(s): ESOPHAGOGASTRODUODENOSCOPY (EGD) (N/A) as a surgical intervention .  The patient's history has been reviewed, patient examined, no change in status, stable for surgery.  I have reviewed the patient's chart and labs.  Questions were answered to the patient's satisfaction.     Lajuan Lines Rakhi Romagnoli

## 2017-06-06 NOTE — Op Note (Signed)
[facility] Patient Name: Patrick [name] Procedure Date: 06/06/2017 MRN: [id number] Attending MD: Jerene Bears , MD Date of Birth: [birth year] CSN: [id number] Age: 67 Admit Type: Inpatient Procedure:                Upper GI endoscopy Indications:              Dysphagia, Odynophagia, Esophagitis Providers:                Lajuan Lines. Hilarie Fredrickson, MD, Cleda Daub, RN, Alan Mulder, Technician Referring MD:             [facility] Medicines:                Monitored Anesthesia Care Complications:            No immediate complications. Estimated Blood Loss:     Estimated blood loss was minimal. Procedure:                Pre-Anesthesia Assessment:                           - Prior to the procedure, a History and Physical                            was performed, and patient medications and                            allergies were reviewed. The patient's tolerance of                            previous anesthesia was also reviewed. The risks                            and benefits of the procedure and the sedation                            options and risks were discussed with the patient.                            All questions were answered, and informed consent                            was obtained. Prior Anticoagulants: The patient has                            taken no previous anticoagulant or antiplatelet                            agents. ASA Grade Assessment: III - A patient with                            severe systemic disease. After reviewing the risks  and benefits, the patient was deemed in                            satisfactory condition to undergo the procedure.                           After obtaining informed consent, the endoscope was                            passed under direct vision. Throughout the                            procedure, the patient's blood pressure, pulse, and                 oxygen saturations were monitored continuously. The                            [id number] ([id number]) scope was introduced through the                            mouth, and advanced to the lower third of                            esophagus. The patient tolerated the procedure                            well. The upper GI endoscopy was technically                            difficult and complex due to narrowing. Successful                            completion of the procedure was aided by                            withdrawing the scope and replacing with the                            pediatric endoscope. Scope In: Scope Out: Findings:      Severe esophagitis with no bleeding was found 32 cm from the incisors.       This is circumferential and causing severe stenosis. The adult upper       endoscopy would not traverse this segment and thus was changed to a       pediatric endoscope. The pediatric endoscopy also would not traverse the       stenosis and there was contact bleeding. Additional force to pass the       scope was not applied due to severity of ulcerative esophagitis and       contact bleeding. Biopsies were taken with a cold forceps for histology       from the ulcer center and at the edges (rule out viral component).      An examination of the stomach was not performed.      An examination of the duodenum was not performed. Impression:               -  Severe circumferential ulcerative esophagitis.                            Biopsied.                           - Severe esophageal stenosis that could not be                            passed with pediatric upper endoscope. Moderate Sedation:      N/A Recommendation:           - Return patient to hospital ward for ongoing care.                           - Clear liquid diet.                           - Liquid PPI BID (I do not think a pill will pass                            this stenosis and make adherence more  difficult).                           - Liquid sucralfate QID.                           - Await pathology results.                           - Barium esophagram to help determine length of                            stenosis.                           - Will need alternative nutrition to meet caloric                            needs, such as TPN or PEG. No a candidate for                            esophageal stent at present.                           - May need alternative living situation to meet                            medical needs.                           - Continue present medications. Procedure Code(s):        --- Professional ---                           [phone], Esophagoscopy, flexible, transoral; with  biopsy, single or multiple Diagnosis Code(s):        --- Professional ---                           K20.9, Esophagitis, unspecified                           R13.10, Dysphagia, unspecified CPT copyright 2016 [employer] Medical Association. All rights reserved. The codes documented in this report are preliminary and upon coder review may  be revised to meet current compliance requirements. Jerene Bears, MD 06/06/2017 12:19:51 PM This report has been signed electronically. Number of Addenda: 0

## 2017-06-06 NOTE — Anesthesia Postprocedure Evaluation (Addendum)
Anesthesia Post Note  Patient: Nat Christen  Procedure(s) Performed: ESOPHAGOGASTRODUODENOSCOPY (EGD) (N/A )     Patient location during evaluation: Endoscopy Anesthesia Type: MAC Level of consciousness: awake and alert Pain management: pain level controlled Vital Signs Assessment: post-procedure vital signs reviewed and stable Respiratory status: spontaneous breathing and respiratory function stable Cardiovascular status: stable Postop Assessment: no apparent nausea or vomiting Anesthetic complications: no    Last Vitals:  Vitals:   06/06/17 1220 06/06/17 1230  BP: 122/84 124/86  Pulse: 77 81  Resp: 19 16  Temp:    SpO2: 100% 93%    Last Pain:  Vitals:   06/06/17 1149  TempSrc: Oral  PainSc:                  Lashay Osborne DANIEL

## 2017-06-06 NOTE — Addendum Note (Signed)
Addendum  created 06/06/17 1717 by Duane Boston, MD   Sign clinical note

## 2017-06-06 NOTE — Transfer of Care (Signed)
Immediate Anesthesia Transfer of Care Note  Patient: Patrick [name]  Procedure(s) Performed: ESOPHAGOGASTRODUODENOSCOPY (EGD) (N/A )  Patient Location: PACU and Endoscopy Unit  Anesthesia Type:MAC  Level of Consciousness: sedated  Airway & Oxygen Therapy: Patient Spontanous Breathing and Patient connected to nasal cannula oxygen  Post-op Assessment: Report given to RN and Post -op Vital signs reviewed and stable  Post vital signs: Reviewed and stable  Last Vitals:  Vitals:   06/06/17 0606 06/06/17 0951  BP: 103/72 109/75  Pulse: 90 82  Resp: 17 16  Temp: 36.6 C 36.4 C  SpO2: 100% 97%    Last Pain:  Vitals:   06/06/17 0951  TempSrc: Oral  PainSc: 7          Complications: No apparent anesthesia complications

## 2017-06-07 ENCOUNTER — Encounter (HOSPITAL_COMMUNITY): Payer: Self-pay | Admitting: Internal Medicine

## 2017-06-07 ENCOUNTER — Inpatient Hospital Stay (HOSPITAL_COMMUNITY): Payer: Self-pay

## 2017-06-07 DIAGNOSIS — R1310 Dysphagia, unspecified: Secondary | ICD-10-CM

## 2017-06-07 MED ORDER — IOPAMIDOL (ISOVUE-300) INJECTION 61%
INTRAVENOUS | Status: AC
  Administered 2017-06-07: 12:00:00
  Filled 2017-06-07: qty 150

## 2017-06-07 NOTE — Progress Notes (Addendum)
Progress Note   [name] ASN:[id number] DOB: October 07, 1950 DOA: 06/05/2017 PCP: [name], MD   LOS: 2 days    Brief Narrative:  Patrick [name] is a 67 year old male with a medical history significant for past alcohol abuse and GERD with severe esophagitis who presented to the ED on 06/04/2017 via EMS per his PCP due to extreme confusion where he was only oriented to person and place. Upon initial presentation to the ED, patient was complaining of weight loss of 40 pounds in 40 days, problems swallowing both solids and liquids associated with pain, constant regurgitation of food due to reflux, right leg pain/weakness, and emesis. In the ED, patient was afebrile, HR 94-110, RR 14-18, BP 93/69-101/78, O2 sat 92-100% on room air. Initial labs in the ED demonstrated normal CBC except for elevated platelets of 470, BUN 28, creatinine 1.7, GFR 41, total protein 8.7, AST 47, Alk phos 216, Calcium 10.5, Chloride 94, sodium 134, ammonia 34. CT demonstrated no acute intracranial abnormality with moderate brain atrophy of frontal/parietal lobes. CXR showed small to moderate hiatal hernia. X-ray of femur demonstrated osteopenia and osteoarthritis changes of hips. MRI illustrated cerebral volume loss with disproportionate bilateral frontoparietal involvement and mild to moderate for age nonspecific signal changes in the cerebral white matter/pons.   Patient was admitted on the working diagnosis of acute encephalopathy related to acute kidney injury and dehydration due to severe dysphagia.    Assessment/Plan:   Active Problems:   Hypertension   Hyponatremia   AKI (acute kidney injury) ([place])   DNR (do not resuscitate)   Acute encephalopathy   Hypercalcemia   Weight loss   Thrombocytosis (HCC)   Encephalopathy acute   Encephalopathy   Stricture and stenosis of esophagus  1. Acute encephalopathy: Multifactoral etiology. MRI demonstrated frontotemporal atrophy which could be suggestive of  the beginning of frontal temporal dementia. Awake and alert today with no confusion or agitation. PT evaluated patient yesterday and recommends SNF placement due to fall risk and weakness  2. Odynophagia and dysphagia caused by ulcerative esophagitis: GI was consulted and EGD was performed yesterday morning which demonstrated severe circumferential ulcerative esophagitis which was biopsied and severe esophageal stenosis that could not be passed with pediatric upper endoscopy. Barium esophagram to determine length of stenosis was performed today which demonstrated persistent distal esophageal stricture proximal to the moderate hiatal hernia measuring 3.3 cm in length etiology mostly likely malignancy vs. Infectious.   -Full liquid diet as tolerated. Nutritionist was consulted yesterday and recommended Boost plus chocolate BID and osmolite 1.5 @ 18m/hr due to severe malnutrition and the risk of refeeding syndrome. Initiate Calorie count. -Liquid PPI BID per GI due to severe stenosis and inability to swallow pills -Liquid sucralfate QID -Await biopsy results for further treatment   3. Acute kidney injury with dehydration, hyponatremia, and hypercalcemia: AKI has resolved. Most current labs that were drawn yesterday demonstrate BUN 17 and Creatinine 1.08. Sodium levels have decrease and were 132 yesterday. Calcium levels have returned to baseline and were 9.2 yesterday morning. -switch from IVFs to oral hydration  4. Severe Malnutrition: Patient complains of 40 pound weight loss over the past 40 days most likely due to problems swallowing and associative pain -treatment plan for ulcerative esophagitis as noted above.  -Nutritional recommendations as noted above  5. Nicotine Abuse: Continue nicoderm cq q24h  7. Alcohol Abuse: No signs of withdrawal. Quit over a month ago. Hold on protocol and add po lorazepam as needed.  Family Communication/Anticipated D/C date and plan/Code Status   DVT  prophylaxis: SCDs Code Status: Full Family Communication: No family at bedside Disposition Plan: SNF   Medical Consultants:  GI    Antimicrobials:     Subjective:  Patient is still feeling weaker than normal and is complaining of right leg pain and weakness that has not improved. He was able to get up and walk across the hall with PT yesterday and states he got somewhat SOB. He is still experiencing significant pain with swallowing both solids and liquids. He currently denies SOB, CP, abdominal pain, nausea, vomiting, and diarrhea.   Objective:   Vitals:   06/06/17 1425 06/06/17 1441 06/06/17 2016 06/07/17 0547  BP: 123/87  106/81 94/68  Pulse: 71  80 95  Resp: _0 Temp: (!) 97.3 F (36.3 C) (!) 97.4 F (36.3 C) 97.6 F (36.4 C) 97.8 F (36.6 C)  TempSrc: Oral  Oral Oral  SpO2: 97%  100% 98%  Weight:      Height:        Intake/Output Summary (Last 24 hours) at 06/07/2017 0846 Last data filed at 06/06/2017 1800 Gross per 24 hour  Intake 1220 ml  Output 75 ml  Net 1145 ml   Filed Weights   06/06/17 0951  Weight: 47.6 kg (105 lb)     Physical Exam:   Constitutional: NAD, appears malnourished and frail Eyes: PERRL, lids and conjunctivae normal ENMT: Mucous membranes are moist.  Neck: normal, supple, no masses Respiratory: clear to auscultation bilaterally, no wheezing, no crackles. Normal respiratory effort. No accessory muscle use.  Cardiovascular: Regular rate and rhythm, normal S1-S2 with diminished heart sounds, no murmurs / rubs / gallops. No LE edema.  Abdomen: soft, nontender and nondistended.  Musculoskeletal: no clubbing / cyanosis. No joint deformity upper and lower extremities.  Skin: no rashes, lesions, ulcers. No induration Neurologic: Awake and alert, non focal. Strength 5/5 upper extremities. Strength 4/5 lower extremities. Grip strength equal bilaterally. Psychiatric: Alert and oriented x 3    Data Reviewed:   I have  independently reviewed following labs and imaging studies:   CBC: Recent Labs  Lab 06/09/2017 1629 06/05/17 0217 06/06/17 0556  WBC 9.8 10.2 9.7  NEUTROABS  --  6.4  --   HGB 13.8 13.5 11.0*  HCT 40.8 39.9 32.5*  MCV 92.1 93.2 90.3  PLT 470* 445* 765   Basic Metabolic Panel: Recent Labs  Lab 06/09/17 1629 06/05/17 0217 06/06/17 0556  NA 134* 133* 132*  K 4.0 5.1 3.7  CL 94* 99* 102  CO2 _1 GLUCOSE 116* 90 90  BUN 28* 28* 17  CREATININE 1.70* 1.80* 1.08  CALCIUM 10.5* 9.6 9.2  MG  --  1.9  --    GFR: Estimated Creatinine Clearance: 46.5 mL/min (by C-G formula based on SCr of 1.08 mg/dL). Liver Function Tests: Recent Labs  Lab 2017-06-09 1629 06/05/17 0217  AST 47* 55*  ALT 20 14*  ALKPHOS 216* 176*  BILITOT 0.9 1.0  PROT 8.7* 6.7  ALBUMIN 3.7 2.9*   Recent Labs  Lab June 09, 2017 1629  LIPASE 25   Recent Labs  Lab 06/05/17 0217  AMMONIA 34   Coagulation Profile: No results for input(s): INR, PROTIME in the last 168 hours. Cardiac Enzymes: No results for input(s): CKTOTAL, CKMB, CKMBINDEX, TROPONINI in the last 168 hours. BNP (last 3 results) No results for input(s): PROBNP in the last 8760 hours. HbA1C: No results for input(s): HGBA1C  in the last 72 hours. CBG: No results for input(s): GLUCAP in the last 168 hours. Lipid Profile: No results for input(s): CHOL, HDL, LDLCALC, TRIG, CHOLHDL, LDLDIRECT in the last 72 hours. Thyroid Function Tests: Recent Labs    06/05/17 1336  TSH 0.909   Anemia Panel: Recent Labs    06/05/17 1336  VITAMINB12 383   Urine analysis:    Component Value Date/Time   COLORURINE YELLOW 06/05/2017 [place] 06/05/2017 1632   LABSPEC 1.018 06/05/2017 1632   PHURINE 5.0 06/05/2017 1632   GLUCOSEU NEGATIVE 06/05/2017 1632   HGBUR NEGATIVE 06/05/2017 1632   HGBUR negative 02/21/2010 [place] 06/05/2017 1632   BILIRUBINUR n 03/11/2014 1025   KETONESUR NEGATIVE 06/05/2017 1632    PROTEINUR NEGATIVE 06/05/2017 1632   UROBILINOGEN 0.2 03/11/2014 1025   UROBILINOGEN 0.2 02/21/2010 1055   NITRITE NEGATIVE 06/05/2017 1632   LEUKOCYTESUR NEGATIVE 06/05/2017 1632   Sepsis Labs: Invalid input(s): PROCALCITONIN, LACTICIDVEN  No results found for this or any previous visit (from the past 240 hour(s)).    Radiology Studies: No results found.    Medication:   . folic acid  1 mg Oral Daily  . Gerhardt's butt cream  1 application Topical TID  . lactose free nutrition  237 mL Oral BID BM  . magnesium oxide  400 mg Oral Daily  . multivitamin with minerals  1 tablet Oral Daily  . nicotine  21 mg Transdermal Daily  . pantoprazole sodium  40 mg Oral BID  . sucralfate  1 g Oral TID WC & HS  . thiamine  100 mg Oral Daily   Or  . thiamine  100 mg Intravenous Daily  . traZODone  50 mg Oral QHS    Continuous Infusions: . dextrose 5 % and 0.45% NaCl 75 mL/hr at 06/06/17 1932  . sodium chloride        Time spent: 20 minutes  Signed, Lanna Poche, PA-S [employer] 2020 Email: [email or web address]

## 2017-06-07 NOTE — Progress Notes (Signed)
Esophageal biopsy results reviewed. I spoke with Pathology and Dr. Saralyn Pilar with add on stains for HSV and CMV.

## 2017-06-07 NOTE — Clinical Social Work Note (Signed)
Clinical Social Work Assessment  Patient Details  Name: Patrick [name] MRN: [id number] Date of Birth: May 16, 1950  Date of referral:  06/07/17               Reason for consult:  Facility Placement                Permission sought to share information with:  Case Manager, Customer service manager, Family Supports Permission granted to share information::     Name::        Agency::  SNF  Relationship::     Contact Information:     Housing/Transportation Living arrangements for the past 2 months:  Single Family Home Source of Information:  Patient Patient Interpreter Needed:  None Criminal Activity/Legal Involvement Pertinent to Current Situation/Hospitalization:  No - Comment as needed Significant Relationships:  Adult Children Lives with:  Adult Children Do you feel safe going back to the place where you live?  Yes Need for family participation in patient care:  Yes (Comment)  Care giving concerns:  Patient lives in a 2 story home and has difficulty ambulating.    Social Worker assessment / plan:  LCSW consulted for SNF placement.  LCSW met at bedside with patient. No family present.  Patient reports that he lives with his son. Patients home is two levels and the bath/shower is up stairs. Patient reports that he has a walker at home but does not use it. Patient stated that he "stumbles around" at home. Patient reports that his sone helps him as much as he can. Patient is able to sponge bathe and reports that he was ale to get up stairs to the shower a few times.   Patient is agreeable to SNF, but no payor source. Patient had a pending medicaid application but did not follow up. Patient reports that he pays his bills with his retirement money.   LCSW spoke with patients sister, Patrick [name]. Patrick [name] reports that patient has run out of retirement money and that she paid his rent last month. She also reported that patient does not have a legal guardian. LCSW did not find any  documentation in Epic stating that patient had a legal guardian.   PLAN: TBD   Employment status:  Retired Forensic scientist:  Self Pay (Medicaid Pending) PT Recommendations:  [place] / Referral to community resources:     Patient/Family's Response to care:  Patient is more accepting than he has been on previous visits.   Patient/Family's Understanding of and Emotional Response to Diagnosis, Current Treatment, and Prognosis:  Patient is understanding of diagnosis and agreeable to SNF. Patient does not have a payor source for SNF placement.   Emotional Assessment Appearance:  Appears stated age Attitude/Demeanor/Rapport:    Affect (typically observed):  Calm, Accepting Orientation:  Oriented to Place, Oriented to Self, Oriented to  Time, Oriented to Situation Alcohol / Substance use:  Alcohol Use, Illicit Drugs Psych involvement (Current and /or in the community):  No (Comment)  Discharge Needs  Concerns to be addressed:  Financial / Insurance Concerns Readmission within the last 30 days:  No Current discharge risk:  Lack of support system Barriers to Discharge:  Continued Medical Work up, Inadequate or no insurance   [place], LCSW 06/07/2017, 3:51 PM
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
## 2017-06-07 NOTE — Progress Notes (Signed)
PROGRESS NOTE    Patrick [name]  WJX:[id number] DOB: July 22, 1950 DOA: 06/05/2017 PCP: [name], MD    Brief Narrative:  67 year old male who was referred for admission due to significant weight loss and deconditioning.  He does have a significant past medical history of alcohol abuse, Candida esophagitis.  Patient complaining of dysphagia and odynophagia, associated with significant weight loss and deconditioning.  On the initial physical examination blood pressure 101/78, heart rate 110, respiratory 14, temperature 97.4, oxygen saturation 92%.  Moist mucous membranes, lungs clear to auscultation bilaterally, heart S1-S2 present rhythmic, abdomen soft nontender, no lower extremity edema.  Sodium 133, potassium 5.1, chloride 90, bicarb 24, glucose 90, BUN 28, creatinine 1.80, white count 10.2, hemoglobin 13.5, hematocrit 39.9, platelets 445.  Urinalysis negative for infection.  Drug screen positive for benzodiazepines and tetrahydrocannabinol.  Head CT negative for acute changes.  Brain MRI negative for acute changes.  Chest x-ray with hyperinflated lung fields, no infiltrates.  EKG normal sinus rhythm, normal axis, normal intervals.  Right pelvic films no acute fractures.  Patient was admitted to the hospital can diagnosis acute metabolic encephalopathy related to odynophagia/dysphasia, poor oral intake, and acute kidney injury.    Assessment & Plan:   Active Problems:   Hypertension   Hyponatremia   AKI (acute kidney injury) ([place])   DNR (do not resuscitate)   Acute encephalopathy   Hypercalcemia   Weight loss   Thrombocytosis (HCC)   Encephalopathy acute   Encephalopathy   Stricture and stenosis of esophagus  1.  Metabolic encephalopathy. Clinically improved, patient will beed snf at discharge, will follow with social services, possible discharge in am. Continue neuro checks per unit protocol, will continue thiamine and multivitamins.   2.  Dysphasia/odynophagia. Severe  esophagitis with esophageal stenosis. Tolerating well full liquid diet, will continue nutritional supplements, patient has declined PEg tube at this time, likely will not benefit from TPN short term, will continue to use GI tract as tolerated, continue antiacid therapy, with pantoprazole and sucralfate, will need close follow up as outpatient.  3.  Acute kidney injury with dehydration/hyponatremia/hypercalcemia. Renal function has improved, will follow renal panel in am, hold on further IV fluids in preparation for discharge in am. Continue mg for hypomagnesemia.   4.  Deconditioning. Will need SNF at discharge, continue nutritional supplements.   5. Alcohol abuse/ tobacco abuse. tolerating well as needed lorazepam, no sings of withdrawal. Continue nicotine patch.   6. Severe calorie protein malnutrition. Will continue dietary supplementation, at this poitn declined PEG tube.   DVT prophylaxis: scd  Code Status:  full Family Communication:  No family at the bedside Disposition Plan: home/ snf   Consultants:   GI   Procedures:   Endoscopy  Antimicrobials:      Subjective: Patient is feeling better, tolerating full liquids, no nausea or vomiting, no chest pain or dyspnea. No fever or chills.   Objective: Vitals:   06/06/17 1425 06/06/17 1441 06/06/17 2016 06/07/17 0547  BP: 123/87  106/81 94/68  Pulse: 71  80 95  Resp: 16  18 18   Temp: (!) 97.3 F (36.3 C) (!) 97.4 F (36.3 C) 97.6 F (36.4 C) 97.8 F (36.6 C)  TempSrc: Oral  Oral Oral  SpO2: 97%  100% 98%  Weight:      Height:        Intake/Output Summary (Last 24 hours) at 06/07/2017 0915 Last data filed at 06/06/2017 1800 Gross per 24 hour  Intake 1220 ml  Output  75 ml  Net 1145 ml   Filed Weights   06/06/17 0951  Weight: 47.6 kg (105 lb)    Examination:   General: Not in pain or dyspnea, deconditioned Neurology: Awake and alert, non focal  E ENT: mild pallor, no icterus, oral mucosa  moist Cardiovascular: No JVD. S1-S2 present, rhythmic, no gallops, rubs, or murmurs. No lower extremity edema. Pulmonary: decreased breath sounds bilaterally at bases, adequate air movement, no wheezing, rhonchi or rales. Gastrointestinal. Abdomen flat, no organomegaly, non tender, no rebound or guarding Skin. No rashes Musculoskeletal: no joint deformities     Data Reviewed: I have personally reviewed following labs and imaging studies  CBC: Recent Labs  Lab 06/04/17 1629 06/05/17 0217 06/06/17 0556  WBC 9.8 10.2 9.7  NEUTROABS  --  6.4  --   HGB 13.8 13.5 11.0*  HCT 40.8 39.9 32.5*  MCV 92.1 93.2 90.3  PLT 470* 445* 782   Basic Metabolic Panel: Recent Labs  Lab 06/04/17 1629 06/05/17 0217 06/06/17 0556  NA 134* 133* 132*  K 4.0 5.1 3.7  CL 94* 99* 102  CO2 26 24 24   GLUCOSE 116* 90 90  BUN 28* 28* 17  CREATININE 1.70* 1.80* 1.08  CALCIUM 10.5* 9.6 9.2  MG  --  1.9  --    GFR: Estimated Creatinine Clearance: 46.5 mL/min (by C-G formula based on SCr of 1.08 mg/dL). Liver Function Tests: Recent Labs  Lab 06/04/17 1629 06/05/17 0217  AST 47* 55*  ALT 20 14*  ALKPHOS 216* 176*  BILITOT 0.9 1.0  PROT 8.7* 6.7  ALBUMIN 3.7 2.9*   Recent Labs  Lab 06/04/17 1629  LIPASE 25   Recent Labs  Lab 06/05/17 0217  AMMONIA 34   Coagulation Profile: No results for input(s): INR, PROTIME in the last 168 hours. Cardiac Enzymes: No results for input(s): CKTOTAL, CKMB, CKMBINDEX, TROPONINI in the last 168 hours. BNP (last 3 results) No results for input(s): PROBNP in the last 8760 hours. HbA1C: No results for input(s): HGBA1C in the last 72 hours. CBG: No results for input(s): GLUCAP in the last 168 hours. Lipid Profile: No results for input(s): CHOL, HDL, LDLCALC, TRIG, CHOLHDL, LDLDIRECT in the last 72 hours. Thyroid Function Tests: Recent Labs    06/05/17 1336  TSH 0.909   Anemia Panel: Recent Labs    06/05/17 1336  [place]       Radiology Studies: I have reviewed all of the imaging during this hospital visit personally     Scheduled Meds: . folic acid  1 mg Oral Daily  . Gerhardt's butt cream  1 application Topical TID  . lactose free nutrition  237 mL Oral BID BM  . magnesium oxide  400 mg Oral Daily  . multivitamin with minerals  1 tablet Oral Daily  . nicotine  21 mg Transdermal Daily  . pantoprazole sodium  40 mg Oral BID  . sucralfate  1 g Oral TID WC & HS  . thiamine  100 mg Oral Daily   Or  . thiamine  100 mg Intravenous Daily  . traZODone  50 mg Oral QHS   Continuous Infusions: . dextrose 5 % and 0.45% NaCl 75 mL/hr at 06/06/17 1932  . sodium chloride       LOS: 2 days        Tawni Millers, MD [facility] Pager [phone]

## 2017-06-07 NOTE — Progress Notes (Signed)
Calorie Count Note  48 hour calorie count ordered per MD.  RD will hang calorie count envelope on the patient's door. Staff to document percent consumed for each item on the patient's meal tray ticket and keep in envelope. Also document percent of any supplement or snack pt consumes and keep documentation in envelope for RD to review.   Will continue to monitor for plan regarding nutrition support. Tube feeding recommendations placed in note from 2/27.  Clayton Bibles, MS, RD, [place] Dietitian Pager: [phone] After Hours Pager: [phone]

## 2017-06-08 DIAGNOSIS — K208 Other esophagitis: Secondary | ICD-10-CM

## 2017-06-08 DIAGNOSIS — K253 Acute gastric ulcer without hemorrhage or perforation: Secondary | ICD-10-CM

## 2017-06-08 DIAGNOSIS — K5900 Constipation, unspecified: Secondary | ICD-10-CM

## 2017-06-08 LAB — BASIC METABOLIC PANEL
Anion gap: 5 (ref 5–15)
BUN: 8 mg/dL (ref 6–20)
CALCIUM: 8.6 mg/dL — AB (ref 8.9–10.3)
CO2: 25 mmol/L (ref 22–32)
CREATININE: 0.85 mg/dL (ref 0.61–1.24)
Chloride: 103 mmol/L (ref 101–111)
GFR calc non Af Amer: >60 mL/min (ref >60)
Glucose, Bld: 77 mg/dL (ref 65–99)
Potassium: 4 mmol/L (ref 3.5–5.1)
SODIUM: 133 mmol/L — AB (ref 135–145)

## 2017-06-08 MED ORDER — ADULT MULTIVITAMIN LIQUID CH
15.0000 mL | Freq: Every day | ORAL | Status: DC
  Administered 2017-06-08 – 2017-06-09 (×2): 15 mL via ORAL
  Filled 2017-06-08 (×2): qty 15

## 2017-06-08 MED ORDER — POLYETHYLENE GLYCOL 3350 17 G PO PACK
17.0000 g | PACK | Freq: Every day | ORAL | Status: DC
  Filled 2017-06-08: qty 1

## 2017-06-08 NOTE — Progress Notes (Signed)
Calorie Count Note  48 hour calorie count ordered.  DAY 1- 2/28-3/1  Diet: SOFT diet Supplements:  Boost Plus chocolate BID- Each supplement provides 360kcal and 14g protein.    2/28 Dinner: 190 calories, 4 g protein 3/1 Breakfast: 90 calories  3/1 Lunch: 718 calories, 34 g protein Supplements: Per nurse pt only takes sips  Estimated Nutritional Needs:  Kcal:  2000-2200 Protein:  100-110g Fluid:  2L/day  Total intake: 998 kcal (50% of minimum estimated needs)  38 protein (38% of minimum estimated needs)  Nutrition Dx: Severe Malnutrition related to chronic illness(severe esophagitis, esophageal stenosis) as evidenced by percent weight loss, energy intake < or equal to 75% for > or equal to 1 month, moderate fat depletion, severe muscle depletion.  Goal: Patient will meet greater than or equal to 90% of their needs  Intervention:  1. Provide Boost Plus chocolate BID- Each supplement provides 360kcal and 14g protein.   2. Continue calorie count 3. Will monitor for plan regarding nutrition support.  Mariana Single RD, LDN Clinical Nutrition Pager # [phone]

## 2017-06-08 NOTE — Progress Notes (Signed)
LCSW following for SNF placement.   Patient does not have a payor source. Patient has an account with [employer] and is not willing to private pay for SNF or spend down to qualify for medicaid.   LCSW staffed with CSW AD who is not willing to give an LOG since patient has resources.   LCSW updated attending and RNCM.   Patrick [name]

## 2017-06-08 NOTE — Progress Notes (Addendum)
[facility] Progress Note   Chief Complaint:    odynophagia   SUBJECTIVE:    [place] sand which was a little uncomfortable going down   ASSESSMENT AND PLAN:   67 yo male with odynophagia  / weight loss / severe ulcerative esophagitis. Viral stains on biopsy are pending. He is improving. Ate a [place] sandwhich, had a little difficulty doing so. Esophagram shows 3.3 cm distal stricture.  -await viral stains -continue BID liquid PPI and carafate.  -continue anti-reflux measures with HOB elevation -I am concerned about his diet. He could get an impaction due to stricture. Reducing diet to full liquids -Calorie count is in progress. Hopefully he can rebuild nutritional status and maintain it on full liquids and nutritional supplements, at least until esophagus heals and stricture can be dilated as outpatient.    2. Constipation, got miralax yesterday.  -will increase miralax to qd, not just prn  22..   OBJECTIVE:      Vital signs in last 24 hours: Temp:  [98.3 F (36.8 C)-100.4 F (38 C)] 98.3 F (36.8 C) (03/01 0651) Pulse Rate:  [87-114] 87 (03/01 0651) Resp:  [16-18] 18 (03/01 0651) BP: (100-104)/(56-64) 100/64 (03/01 0651) SpO2:  [96 %-97 %] 97 % (03/01 0651) Last BM Date: (pt unable to recall) General:   Alert, thin white male  in NAD EENT:  Normal hearing, non icteric sclera, conjunctive pink.  Heart:  Regular rate and rhythm, no lower extremity edema Pulm: Normal respiratory effort, lungs CTA bilaterally without wheezes or crackles. Abdomen:  Soft, nondistended, nontender.  Normal bowel sounds, no masses felt. No hepatomegaly.    Neurologic:  Alert and  oriented x4;  grossly normal neurologically. Psych:  Pleasant, cooperative.  Normal mood and affect.   Intake/Output from previous day: 02/28 0701 - 03/01 0700 In: 120 [P.O.:120] Out: 450 [Urine:450] Intake/Output this shift: No intake/output data recorded.  Lab Results: Recent Labs   06/06/17 0556  WBC 9.7  HGB 11.0*  HCT 32.5*  PLT 246   BMET Recent Labs    06/06/17 0556 06/08/17 0532  NA 132* 133*  K 3.7 4.0  CL 102 103  CO2 24 25  GLUCOSE 90 77  BUN 17 8  CREATININE 1.08 0.85  CALCIUM 9.2 8.6*   LDg Esophagus  Result Date: 06/07/2017 CLINICAL DATA:  Patient with history of esophagitis and esophageal stricture. EXAM: ESOPHOGRAM/BARIUM SWALLOW TECHNIQUE: Single contrast examination was performed using thin barium and water soluble. FLUOROSCOPY TIME:  Fluoroscopy Time:  4.5 minutes Radiation Exposure Index (if provided by the fluoroscopic device): 32.3 mGy Number of Acquired Spot Images: 0 COMPARISON:  Chest radiograph 06/05/2017; CT chest 11/29/2016 FINDINGS: Exam was initiated with water-soluble contrast and completed with thin barium. The contrast passed through the esophagus into the stomach. There is a moderate hiatal hernia. Within the distal third of the esophagus there is a persistent stricture which measures approximately 3.3 cm in length (image 247; series 3). Additionally, there are findings suggestive of esophageal wall thickening. IMPRESSION: 1. There is a persistent distal esophageal stricture proximal to the moderate hiatal hernia measuring approximately 3.3 cm in length. Considerations include malignant or infectious etiologies. 2. Moderate hiatal hernia. Electronically Signed   By: Lovey Newcomer M.D.   On: 06/07/2017 12:02     Active Problems:   Hypertension   Hyponatremia   AKI (acute kidney injury) ([place])   DNR (do not resuscitate)   Acute encephalopathy   Hypercalcemia   Weight loss  Thrombocytosis (HCC)   Encephalopathy acute   Encephalopathy   Stricture and stenosis of esophagus   Odynophagia     LOS: 3 days   Tye Savoy ,NP 06/08/2017, 12:18 PM  Pager number [phone]   Attending physician's note   I have taken an interval history, reviewed the chart and examined the patient. I agree with the Advanced Practitioner's  note, impression and recommendations.  Patient ate [place] sandwich for lunch, said it was little more difficult going down.  He did leave the crust of the bread out.  Severe erosive esophagitis with diffuse ulceration and very tight stricture, could not pass 6 mm pediatric endoscope.  Please restrict his diet to soft foods, avoid tough meat, raw vegetables.  Continue calorie count Await viral stains Continue PPI twice daily and Carafate, along with antireflux measures Repeat EGD in 6-8 weeks to reevaluate and obtain biopsies along with possible dilation Will sign off, but available for any questions  K Denzil Magnuson, MD [phone] Mon-Fri 8a-5p [phone] after 5p, weekends, holidays

## 2017-06-08 NOTE — Progress Notes (Signed)
PROGRESS NOTE    Patrick [name]  EHU:[id number] DOB: January 23, 1951 DOA: 06/05/2017 PCP: [name], MD    Brief Narrative:  67 year old male who was referred for admission due to significant weight loss and deconditioning. He does have a significant past medical history of alcohol abuse, Candida esophagitis.Patient complaining of dysphagia and odynophagia,associated with significant weight loss and deconditioning. On the initial physical examination blood pressure 101/78, heart rate 110, respiratory 14, temperature 97.4, oxygen saturation 92%.Moist mucous membranes, lungs clear to auscultation bilaterally, heart S1-S2 present rhythmic, abdomen soft nontender, no lower extremity edema.Sodium 133, potassium 5.1, chloride 90, bicarb 24, glucose 90, BUN 28, creatinine 1.80, white count 10.2, hemoglobin 13.5, hematocrit 39.9, platelets 445.Urinalysis negative for infection. Drug screen positive for benzodiazepines and tetrahydrocannabinol.Head CT negative for acute changes. Brain MRI negative for acute changes. Chest x-ray with hyperinflated lung fields, no infiltrates. EKG normal sinus rhythm, normal axis, normal intervals. Right pelvic films no acute fractures.  Patient was admitted to the hospital can diagnosis acute metabolic encephalopathy related to odynophagia/dysphasia,poor oral intake,andacute kidney injury   Assessment & Plan:   Active Problems:   Hypertension   Hyponatremia   AKI (acute kidney injury) ([place])   DNR (do not resuscitate)   Acute encephalopathy   Hypercalcemia   Weight loss   Thrombocytosis (HCC)   Encephalopathy acute   Encephalopathy   Stricture and stenosis of esophagus   Odynophagia   1.Metabolic encephalopathy. patient has been tolerating full liquids but not soft diet, no confusion or agitation. Not able to afford snf or home health services, will continue 24 hours monitoring, with plan to discharge home in am.    2.Dysphasia/odynophagia. Severe esophagitis with esophageal stenosis. Tolerated full liquid diet but not soft diet, severe esophageal stenosis, will continue nutritional supplements. Follow on viral cultures, continue antiacid therapy, will need outpatient follow up, for possible dilatation. He continues to decline peg tube. Esophagogram with 3.3 cm length stricture.   3.Acute kidney injury with dehydration/hyponatremia/hypercalcemia. Renal function and electrolytes stable while off IV fluids, continue to encourage po intake.  4.Deconditioning. Patient not able to afford SNF, plan to discharge home in am, if continue to tolerate well full liquid diet.   5. Alcohol abuse/ tobacco abuse.Continue with prn lorazepam, continue nicotine patch. No current signs of withdrawal.   6. Severe calorie protein malnutrition. Nutritional supplementation, calorie count in progress to be completed on 03/02. Will need close follow up.   DVT prophylaxis:scd Code Status:full Family Communication:No family at the bedside Disposition Plan:home/ snf   Consultants:  GI  Procedures:  Endoscopy  Antimicrobials:       Subjective: Patient tolerating well full liquids, but had difficulty with soft diet, no nausea or vomiting. Positive weakness.  Objective: Vitals:   06/07/17 0547 06/07/17 1400 06/07/17 2100 06/08/17 0651  BP: 94/68 (!) 104/56 100/60 100/64  Pulse: 95 96 (!) 114 87  Resp: 18 18 16 18   Temp: 97.8 F (36.6 C) 98.3 F (36.8 C) (!) 100.4 F (38 C) 98.3 F (36.8 C)  TempSrc: Oral Oral Oral Oral  SpO2: 98% 96% 97% 97%  Weight:      Height:        Intake/Output Summary (Last 24 hours) at 06/08/2017 1457 Last data filed at 06/08/2017 1300 Gross per 24 hour  Intake 360 ml  Output 300 ml  Net 60 ml   Filed Weights   06/06/17 0951  Weight: 47.6 kg (105 lb)    Examination:   General: Not in pain  or dyspnea, deconditioned Neurology: Awake and alert,  non focal  E ENT: mild pallor, no icterus, oral mucosa moist Cardiovascular: No JVD. S1-S2 present, rhythmic, no gallops, rubs, or murmurs. No lower extremity edema. Pulmonary: vesicular breath sounds bilaterally, adequate air movement, no wheezing, rhonchi or rales. Gastrointestinal. Abdomen flat, no organomegaly, non tender, no rebound or guarding Skin. No rashes Musculoskeletal: no joint deformities     Data Reviewed: I have personally reviewed following labs and imaging studies  CBC: Recent Labs  Lab 06/04/17 1629 06/05/17 0217 06/06/17 0556  WBC 9.8 10.2 9.7  NEUTROABS  --  6.4  --   HGB 13.8 13.5 11.0*  HCT 40.8 39.9 32.5*  MCV 92.1 93.2 90.3  PLT 470* 445* 962   Basic Metabolic Panel: Recent Labs  Lab 06/04/17 1629 06/05/17 0217 06/06/17 0556 06/08/17 0532  NA 134* 133* 132* 133*  K 4.0 5.1 3.7 4.0  CL 94* 99* 102 103  CO2 26 24 24 25   GLUCOSE 116* 90 90 77  BUN 28* 28* 17 8  CREATININE 1.70* 1.80* 1.08 0.85  CALCIUM 10.5* 9.6 9.2 8.6*  MG  --  1.9  --   --    GFR: Estimated Creatinine Clearance: 59.1 mL/min (by C-G formula based on SCr of 0.85 mg/dL). Liver Function Tests: Recent Labs  Lab 06/04/17 1629 06/05/17 0217  AST 47* 55*  ALT 20 14*  ALKPHOS 216* 176*  BILITOT 0.9 1.0  PROT 8.7* 6.7  ALBUMIN 3.7 2.9*   Recent Labs  Lab 06/04/17 1629  LIPASE 25   Recent Labs  Lab 06/05/17 0217  AMMONIA 34   Coagulation Profile: No results for input(s): INR, PROTIME in the last 168 hours. Cardiac Enzymes: No results for input(s): CKTOTAL, CKMB, CKMBINDEX, TROPONINI in the last 168 hours. BNP (last 3 results) No results for input(s): PROBNP in the last 8760 hours. HbA1C: No results for input(s): HGBA1C in the last 72 hours. CBG: No results for input(s): GLUCAP in the last 168 hours. Lipid Profile: No results for input(s): CHOL, HDL, LDLCALC, TRIG, CHOLHDL, LDLDIRECT in the last 72 hours. Thyroid Function Tests: No results for input(s): TSH,  T4TOTAL, FREET4, T3FREE, THYROIDAB in the last 72 hours. Anemia Panel: No results for input(s): VITAMINB12, FOLATE, FERRITIN, TIBC, IRON, RETICCTPCT in the last 72 hours.    Radiology Studies: I have reviewed all of the imaging during this hospital visit personally     Scheduled Meds: . folic acid  1 mg Oral Daily  . Gerhardt's butt cream  1 application Topical TID  . lactose free nutrition  237 mL Oral BID BM  . magnesium oxide  400 mg Oral Daily  . multivitamin  15 mL Oral Daily  . nicotine  21 mg Transdermal Daily  . pantoprazole sodium  40 mg Oral BID  . polyethylene glycol  17 g Oral Daily  . sucralfate  1 g Oral TID WC & HS  . thiamine  100 mg Oral Daily   Or  . thiamine  100 mg Intravenous Daily  . traZODone  50 mg Oral QHS   Continuous Infusions: . sodium chloride       LOS: 3 days        Jamason Peckham Gerome Apley, MD [facility] Pager [phone]

## 2017-06-08 NOTE — Progress Notes (Addendum)
Spoke with patient at bedside, discussed discharge plan. He states he cannot pay privately for SNF, he plans to go home. Discussed [facility] services would likely ask for private pay as well since he has savings that would cover those expenses and he would not qualify for Medicaid with 10K in savings. He has not used [employer] in the last 59mo so he would qualify for assistance if needed. Await d/c meds. Patient states he will return home with his son, no further HH needs identified. [phone]

## 2017-06-08 NOTE — Progress Notes (Signed)
Physical Therapy Treatment Patient Details Name: Patrick [name] MRN: [id number] DOB: 10-22-1950 Today's Date: 06/08/2017    History of Present Illness 67 year old male who was referred for admission due to significant weight loss and deconditioning.  Does have a significant past medical history of alcohol abuse, Candida esophagitis.  Patient complaining of dysphagia and odynophagia, associated with significant weight loss and deconditioning.    PT Comments    Pt is progressing toward PT goals; recommend SNF vs HHPT(pt has improved and initially refused SNF --now agreeable?); will continue to follow   Follow Up Recommendations  SNF(vs HHPT depending on ins/if pt agreeable)     Equipment Recommendations  None recommended by PT    Recommendations for Other Services       Precautions / Restrictions Precautions Precautions: Fall Precaution Comments: admits to multiple falls Restrictions Weight Bearing Restrictions: No    Mobility  Bed Mobility Overal bed mobility: Modified Independent                Transfers Overall transfer level: Needs assistance Equipment used: None Transfers: Sit to/from Stand Sit to Stand: Min guard         General transfer comment: for balance/safety  Ambulation/Gait Ambulation/Gait assistance: Min guard;Supervision Ambulation Distance (Feet): 320 Feet Assistive device: None Gait Pattern/deviations: Step-through pattern;Drifts right/left;Wide base of support     General Gait Details: pt with mildly unsteady gait, no overt LOB but drifts left/right, pt with baseline compensatory techniques (wide BOS, decr cadence); hallway very congested and busy, pt needing min./guard for safety to negotiate obstacles   Stairs            Wheelchair Mobility    Modified Rankin (Stroke Patients Only)       Balance   Sitting-balance support: Feet supported Sitting balance-Leahy Scale: Good       Standing balance-Leahy Scale:  Fair Standing balance comment: static standing with S, min/gaurd for dynamic, only able to tol min perturbations                            Cognition Arousal/Alertness: Awake/alert Behavior During Therapy: Flat affect Overall Cognitive Status: Within Functional Limits for tasks assessed                                        Exercises      General Comments        Pertinent Vitals/Pain Pain Assessment: No/denies pain    Home Living                      Prior Function            PT Goals (current goals can now be found in the care plan section) Acute Rehab PT Goals Patient Stated Goal: To go home PT Goal Formulation: With patient Time For Goal Achievement: 06/20/17 Potential to Achieve Goals: Fair Progress towards PT goals: Progressing toward goals    Frequency    Min 3X/week      PT Plan Current plan remains appropriate    Co-evaluation              AM-PAC PT "6 Clicks" Daily Activity  Outcome Measure  Difficulty turning over in bed (including adjusting bedclothes, sheets and blankets)?: A Little Difficulty moving from lying on back to sitting on the side of the bed? : A  Little Difficulty sitting down on and standing up from a chair with arms (e.g., wheelchair, bedside commode, etc,.)?: A Little Help needed moving to and from a bed to chair (including a wheelchair)?: A Little Help needed walking in hospital room?: A Little Help needed climbing 3-5 steps with a railing? : A Little 6 Click Score: 18    End of Session Equipment Utilized During Treatment: Gait belt Activity Tolerance: Patient tolerated treatment well Patient left: in bed;with call bell/phone within reach(alarm not set on arrival)   PT Visit Diagnosis: Muscle weakness (generalized) (M62.81);History of falling (Z91.81);Other abnormalities of gait and mobility (R26.89);Unsteadiness on feet (R26.81)     Time: 0923-3007 PT Time Calculation (min)  (ACUTE ONLY): 14 min  Charges:  $Gait Training: 8-22 mins                    G CodesKenyon Ana, PT Pager: [phone] 06/08/2017    Kenyon Ana 06/08/2017, 10:26 AM

## 2017-06-09 DIAGNOSIS — K222 Esophageal obstruction: Secondary | ICD-10-CM

## 2017-06-09 DIAGNOSIS — G9340 Encephalopathy, unspecified: Secondary | ICD-10-CM

## 2017-06-09 DIAGNOSIS — N179 Acute kidney failure, unspecified: Secondary | ICD-10-CM

## 2017-06-09 DIAGNOSIS — E871 Hypo-osmolality and hyponatremia: Secondary | ICD-10-CM

## 2017-06-09 MED ORDER — ADULT MULTIVITAMIN LIQUID CH: 15.0000 mL | Freq: Every day | ORAL | 0 refills | Status: AC

## 2017-06-09 MED ORDER — BOOST PLUS PO LIQD: 237.0000 mL | Freq: Two times a day (BID) | ORAL | 0 refills | Status: DC

## 2017-06-09 NOTE — Progress Notes (Signed)
Viral stains for CMV and HSV were negative. Patient for discharge today. Looks like he is going back home. No payor source. He is unwilling to pay out of pocket for SNF.I will have our office call him regarding follow up appt for treatment of esophageal stricture. Keep on full liquids

## 2017-06-09 NOTE — Progress Notes (Signed)
Discharge instructions and medications discussed with patient.  Prescription and AVS given to patient.  Patient stated he does not have a legal guardian and does not want his sister Ilean Skill notified of discharge.  Patient son is to pick him up.

## 2017-06-09 NOTE — Care Management Note (Signed)
Case Management Note  Patient Details  Name: [name] MRN: [id number] Date of Birth: [birth year]  Subjective/Objective:    Severe esophagitis with significant esophageal stenosis, mutifactorial metabolic encephalopathy, AKI                Action/Plan: Spoke to pt and currently does not qualify for charity program for [facility]. Pt does not want to pay out of pocket for [facility]. Has RW at home. Lives at home with son.   PCP Carolann Littler MD  Expected Discharge Date:  06/09/17               Expected Discharge Plan:  Home/Self Care  In-House Referral:  NA  Discharge planning Services  CM Consult  Post Acute Care Choice:  NA Choice offered to:  Patient  DME Arranged:  N/A DME Agency:  NA  HH Arranged:  Patient Refused [place] Agency:  NA  Status of Service:  Completed, signed off  If discussed at [place] of Stay Meetings, dates discussed:    Additional Comments:  Erenest Rasher, RN 06/09/2017, 10:19 AM

## 2017-06-09 NOTE — Discharge Summary (Signed)
Physician Discharge Summary  Patrick [name] ZYS:[id number] DOB: [birth year] DOA: 06/05/2017  PCP: [name], MD  Admit date: 06/05/2017 Discharge date: 06/09/2017  Admitted From: Home Disposition:  Home  Recommendations for Outpatient Follow-up and new medication changes:  1. Follow up with PCP in 1-weeks 2. Follow up with GI in 6 to 8 weeks to re-evaluate for possible esophageal dilation. 3. Pending viral stains of esophageal biopsy  Home Health: no  Equipment/Devices: no   Discharge Condition: Stable CODE STATUS: full  Diet recommendation: Soft diet  Brief/Interim Summary: 67 year old male who was referred for admission due to significant weight loss and deconditioning.He does have a significant past medical history of alcohol abuse, and Candida esophagitis.Patient complaining of dysphagia and odynophagia,associated with significant weight loss and deconditioning. On the initial physical examination blood pressure 101/78, heart rate 110, respiratory rate 14, temperature 97.4, oxygen saturation 92%.Moist mucous membranes, lungs clear to auscultation bilaterally, heart S1-S2 present rhythmic, abdomen soft nontender, no lower extremity edema.Sodium 133, potassium 5.1, chloride 90, bicarb 24, glucose 90, BUN 28, creatinine 1.80, white count 10.2, hemoglobin 13.5, hematocrit 39.9, platelets 445.Urinalysis negative for infection. Drug screen positive for benzodiazepines and tetrahydrocannabinol.Head CT negative for acute changes. Brain MRI negative for acute changes. Chest x-ray with hyperinflated lung fields, no infiltrates. EKG normal sinus rhythm, normal axis, normal intervals. Right pelvic films no acute fractures.  Patient was admitted to the hospital with the working diagnosis acute metabolic encephalopathy related to odynophagia/dysphasia,poor oral intake,and complicated withacute kidney injury.   1.  Severe esophagitis with significant esophageal stenosis.   Patient was admitted to the medical ward,  he received intravenous fluids, aggressive antiacid therapy with pantoprazole and sucralfate.  Clear liquid diet.  Empiric fluconazole.  Further workup with upper endoscopy it showed severe circumferential ulcerative esophagitis, biopsies were taken.  Severe esophageal stenosis, unable to pass pediatric upper endoscope into the stomach.  Biopsies were taken.  Esophagram showed persistent distal esophageal stricture proximal to the moderate hiatal hernia measuring approximately 3.3 cm length.  Patient was able to tolerate soft diet.  Nutrition consultation, with caloric count, recommendations for nutritional supplements.  Patient will follow up with GI in 3-6 weeks to reevaluate possibility of dilation, during this admission he was found not a good candidate for dilation or stenting.  Patient was offered a PEG tube that currently he has declined.  Biopsy results and viral stains pending.  Fluconazole has been discontinued.   2.  Multifactorial metabolic encephalopathy.  Clinically has improved after nutrition and hydration.  Patient was continued on thiamine and multivitamins.  He does have history of alcohol abuse.  He did have episodes of confusion but no agitation.  3.  Acute kidney injury due to dehydration, complicated by hyponatremia and hypercalcemia.  Patient received isotonic IV fluids, and close monitoring of kidney function.  Discharge creatinine 0.5, potassium 4.0, serum bicarbonate 25, calcium 8.6. .   4.  Severe calorie protein malnutrition with deconditioning.  Patient was seen by physical therapy, and nutrition services.  Recommendations for skilled nursing facility unfortunately he was not able to afford.  He also will have to privately pay home health services.   5.  Alcohol and tobacco abuse.  Patient was placed on as needed benzodiazepines, no signs of alcohol withdrawal.  Patient was placed on a nicotine patch.   Discharge Diagnoses:  Active  Problems:   Hypertension   Hyponatremia   AKI (acute kidney injury) ([place])   DNR (do not resuscitate)   Acute encephalopathy  Hypercalcemia   Weight loss   Thrombocytosis (HCC)   Encephalopathy acute   Encephalopathy   Stricture and stenosis of esophagus   Odynophagia    Discharge Instructions   Allergies as of 06/09/2017   No Known Allergies     Medication List    STOP taking these medications   BOUDREAUXS BABY BUTT SMOOTH Oint   ferrous sulfate 325 (65 FE) MG tablet   fluconazole 100 MG tablet Commonly known as:  DIFLUCAN   folic acid 1 MG tablet Commonly known as:  FOLVITE   Gerhardt's butt cream Crea   HYDROcodone-acetaminophen 5-325 MG tablet Commonly known as:  NORCO/VICODIN     TAKE these medications   albuterol 108 (90 Base) MCG/ACT inhaler Commonly known as:  VENTOLIN HFA Inhale 2 puffs into the lungs every 4 (four) hours as needed for wheezing or shortness of breath.   lactose free nutrition Liqd Take 237 mLs by mouth 2 (two) times daily between meals. What changed:  when to take this   magnesium oxide 400 (241.3 Mg) MG tablet Commonly known as:  MAG-OX Take 1 tablet (400 mg total) by mouth daily.   multivitamin Liqd Take 15 mLs by mouth daily. Start taking on:  06/10/2017   nicotine 21 mg/24hr patch Commonly known as:  NICODERM CQ - dosed in mg/24 hours Place 1 patch (21 mg total) onto the skin daily.   pantoprazole 40 MG tablet Commonly known as:  PROTONIX Take 1 tablet (40 mg total) by mouth 2 (two) times daily.   sucralfate 1 GM/10ML suspension Commonly known as:  CARAFATE Take 10 mLs (1 g total) by mouth 4 (four) times daily -  with meals and at bedtime.   thiamine 100 MG tablet Take 1 tablet (100 mg total) by mouth daily.   traZODone 50 MG tablet Commonly known as:  DESYREL Take 1 tablet (50 mg total) by mouth at bedtime.      Follow-up Information    Burchette, Alinda Sierras, MD. Schedule an appointment as soon as possible for a  visit in 1 week(s).   Specialty:  Family Medicine Contact information: [place] [address] [phone]          No Known Allergies  Consultations:  GI   Procedures/Studies: Dg Chest 2 View  Result Date: 06/05/2017 CLINICAL DATA:  67 y/o M; confusion, dehydration, and difficulty walking. Altered mental status. EXAM: CHEST  2 VIEW COMPARISON:  04/20/2017 chest radiograph.  11/29/2016 CT chest. FINDINGS: Normal cardiac silhouette. Aortic atherosclerosis with calcification. Small to moderate hiatal hernia. Emphysema hyperinflated lungs compatible with COPD. No focal consolidation. No pleural effusion or pneumothorax. No acute osseous abnormality is evident. IMPRESSION: Small to moderate hiatal hernia. COPD. No acute pulmonary process identified. Electronically Signed   By: Kristine Garbe M.D.   On: 06/05/2017 02:06   Ct Head Wo Contrast  Result Date: 06/05/2017 CLINICAL DATA:  67 y/o  M; confusion and weakness. EXAM: CT HEAD WITHOUT CONTRAST TECHNIQUE: Contiguous axial images were obtained from the base of the skull through the vertex without intravenous contrast. COMPARISON:  None. FINDINGS: Brain: No evidence of acute infarction, hemorrhage, hydrocephalus, extra-axial collection or mass lesion/mass effect. Scattered nonspecific foci of hypoattenuation in subcortical and periventricular white matter compatible with moderate chronic microvascular ischemic changes for age and there is moderate frontal parietal predominance brain parenchymal volume loss. Vascular: Mild calcific atherosclerosis of carotid siphons. No hyperdense vessel. Skull: Normal. Negative for fracture or focal lesion. Sinuses/Orbits: No acute finding.  Other: None. IMPRESSION: 1. No acute intracranial abnormality identified. 2. Moderate for age chronic microvascular ischemic changes. Moderate brain atrophy with frontal and parietal predominance. Electronically Signed   By: Kristine Garbe M.D.   On: 06/05/2017 02:03   Mr Brain Wo Contrast  Result Date: 06/05/2017 CLINICAL DATA:  67 year old male with confusion and altered mental status since yesterday. EXAM: MRI HEAD WITHOUT CONTRAST TECHNIQUE: Multiplanar, multiecho pulse sequences of the brain and surrounding structures were obtained without intravenous contrast. COMPARISON:  Head CT without contrast 0151 hours today. FINDINGS: Brain: No restricted diffusion to suggest acute infarction. No midline shift, mass effect, evidence of mass lesion, ventriculomegaly, extra-axial collection or acute intracranial hemorrhage. Cervicomedullary junction and pituitary are within normal limits. Cerebral volume loss over that expected for age. There seems to be disproportionate atrophy of the frontal and parietal lobes (series 10, image 14). No cortical encephalomalacia identified. No chronic cerebral blood products. Mild to moderate for age patchy periventricular, splenium corpus callosum, and pontine nonspecific T2 and FLAIR hyperintensity. The deep gray matter nuclei and cerebellum appear within normal limits for age. Vascular: Major intracranial vascular flow voids are preserved. Skull and upper cervical spine: Normal visible cervical spine. Normal bone marrow signal. Sinuses/Orbits: Postoperative changes to both globes, otherwise normal orbits soft tissues. Paranasal sinuses are clear. Other: Visible internal auditory structures appear normal. Mastoids are clear. Scalp and face soft tissues appear negative. IMPRESSION: 1.  No acute intracranial abnormality. 2. Cerebral volume loss with disproportionate bilateral frontoparietal involvement. 3. Mild to moderate for age nonspecific signal changes in the cerebral white matter and pons, most commonly due to chronic small vessel disease. Electronically Signed   By: Genevie Ann M.D.   On: 06/05/2017 07:16   Dg Esophagus  Result Date: 06/07/2017 CLINICAL DATA:  Patient with history of  esophagitis and esophageal stricture. EXAM: ESOPHOGRAM/BARIUM SWALLOW TECHNIQUE: Single contrast examination was performed using thin barium and water soluble. FLUOROSCOPY TIME:  Fluoroscopy Time:  4.5 minutes Radiation Exposure Index (if provided by the fluoroscopic device): 32.3 mGy Number of Acquired Spot Images: 0 COMPARISON:  Chest radiograph 06/05/2017; CT chest 11/29/2016 FINDINGS: Exam was initiated with water-soluble contrast and completed with thin barium. The contrast passed through the esophagus into the stomach. There is a moderate hiatal hernia. Within the distal third of the esophagus there is a persistent stricture which measures approximately 3.3 cm in length (image 247; series 3). Additionally, there are findings suggestive of esophageal wall thickening. IMPRESSION: 1. There is a persistent distal esophageal stricture proximal to the moderate hiatal hernia measuring approximately 3.3 cm in length. Considerations include malignant or infectious etiologies. 2. Moderate hiatal hernia. Electronically Signed   By: Lovey Newcomer M.D.   On: 06/07/2017 12:02   Dg Hip Unilat W Or Wo Pelvis 2-3 Views Right  Result Date: 06/05/2017 CLINICAL DATA:  67 year old male with right hip pain. EXAM: DG HIP (WITH OR WITHOUT PELVIS) 2-3V RIGHT; RIGHT FEMUR 2 VIEWS COMPARISON:  Abdominal CT dated 01/04/2017. FINDINGS: There is no acute fracture or dislocation. 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 There is narrowing of the inferior aspect of the right femoroacetabular joint space. The bones are osteopenic. The soft tissues appear unremarkable. IMPRESSION: 1. No acute fracture or dislocation. 2. Osteopenia and osteoarthritic changes of the hips. Electronically Signed   By: Anner Crete M.D.   On: 06/05/2017 02:02   Dg Femur Min 2 Views Right  Result Date: 06/05/2017 CLINICAL DATA:  67 year old male with  right hip pain. EXAM: DG HIP (WITH OR WITHOUT PELVIS) 2-3V RIGHT; RIGHT  FEMUR 2 VIEWS COMPARISON:  Abdominal CT dated 01/04/2017. FINDINGS: There is no acute fracture or dislocation. 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 There is narrowing of the inferior aspect of the right femoroacetabular joint space. The bones are osteopenic. The soft tissues appear unremarkable. IMPRESSION: 1. No acute fracture or dislocation. 2. Osteopenia and osteoarthritic changes of the hips. Electronically Signed   By: Anner Crete M.D.   On: 06/05/2017 02:02       Subjective: Patient is feeling better, tolerating po diet well, no nausea or vomiting, no dyspnea or chest pain.   Discharge Exam: Vitals:   06/09/17 0612 06/09/17 0811  BP: 102/67 101/74  Pulse: 89 93  Resp: 17 16  Temp: 98 F (36.7 C) 98.4 F (36.9 C)  SpO2: 96% 98%   Vitals:   06/08/17 1455 06/08/17 2110 06/09/17 0612 06/09/17 0811  BP: 96/64 116/64 102/67 101/74  Pulse: 96 100 89 93  Resp:  16 17 16   Temp: 98.6 F (37 C) 98.7 F (37.1 C) 98 F (36.7 C) 98.4 F (36.9 C)  TempSrc: Oral Oral Oral Oral  SpO2: 99% 98% 96% 98%  Weight:      Height:        General: Not in pain or dyspnea, deconditioned Neurology: Awake and alert, non focal  E ENT: mild pallor, no icterus, oral mucosa moist Cardiovascular: No JVD. S1-S2 present, rhythmic, no gallops, rubs, or murmurs. No lower extremity edema. Pulmonary: vesicular breath sounds bilaterally, adequate air movement, no wheezing, rhonchi or rales. Gastrointestinal. Abdomen flat, no organomegaly, non tender, no rebound or guarding Skin. No rashes Musculoskeletal: no joint deformities   The results of significant diagnostics from this hospitalization (including imaging, microbiology, ancillary and laboratory) are listed below for reference.     Microbiology: No results found for this or any previous visit (from the past 240 hour(s)).   Labs: BNP (last 3 results) No results for input(s): BNP in the last 8760  hours. Basic Metabolic Panel: Recent Labs  Lab 06/04/17 1629 06/05/17 0217 06/06/17 0556 06/08/17 0532  NA 134* 133* 132* 133*  K 4.0 5.1 3.7 4.0  CL 94* 99* 102 103  CO2 26 24 24 25   GLUCOSE 116* 90 90 77  BUN 28* 28* 17 8  CREATININE 1.70* 1.80* 1.08 0.85  CALCIUM 10.5* 9.6 9.2 8.6*  MG  --  1.9  --   --    Liver Function Tests: Recent Labs  Lab 06/04/17 1629 06/05/17 0217  AST 47* 55*  ALT 20 14*  ALKPHOS 216* 176*  BILITOT 0.9 1.0  PROT 8.7* 6.7  ALBUMIN 3.7 2.9*   Recent Labs  Lab 06/04/17 1629  LIPASE 25   Recent Labs  Lab 06/05/17 0217  AMMONIA 34   CBC: Recent Labs  Lab 06/04/17 1629 06/05/17 0217 06/06/17 0556  WBC 9.8 10.2 9.7  NEUTROABS  --  6.4  --   HGB 13.8 13.5 11.0*  HCT 40.8 39.9 32.5*  MCV 92.1 93.2 90.3  PLT 470* 445* 246   Cardiac Enzymes: No results for input(s): CKTOTAL, CKMB, CKMBINDEX, TROPONINI in the last 168 hours. BNP: Invalid input(s): POCBNP CBG: No results for input(s): GLUCAP in the last 168 hours. D-Dimer No results for input(s): DDIMER in the last 72 hours. Hgb A1c No results for input(s): HGBA1C in the last 72 hours. Lipid Profile No results for input(s): CHOL,  HDL, LDLCALC, TRIG, CHOLHDL, LDLDIRECT in the last 72 hours. Thyroid function studies No results for input(s): TSH, T4TOTAL, T3FREE, THYROIDAB in the last 72 hours.  Invalid input(s): FREET3 Anemia work up No results for input(s): VITAMINB12, FOLATE, FERRITIN, TIBC, IRON, RETICCTPCT in the last 72 hours. Urinalysis    Component Value Date/Time   COLORURINE YELLOW 06/05/2017 1632   APPEARANCEUR CLEAR 06/05/2017 1632   LABSPEC 1.018 06/05/2017 1632   PHURINE 5.0 06/05/2017 1632   GLUCOSEU NEGATIVE 06/05/2017 1632   HGBUR NEGATIVE 06/05/2017 1632   HGBUR negative 02/21/2010 [place] 06/05/2017 1632   BILIRUBINUR n 03/11/2014 1025   KETONESUR NEGATIVE 06/05/2017 1632   PROTEINUR NEGATIVE 06/05/2017 1632   UROBILINOGEN 0.2  03/11/2014 1025   UROBILINOGEN 0.2 02/21/2010 1055   NITRITE NEGATIVE 06/05/2017 1632   LEUKOCYTESUR NEGATIVE 06/05/2017 1632   Sepsis Labs Invalid input(s): PROCALCITONIN,  WBC,  LACTICIDVEN Microbiology No results found for this or any previous visit (from the past 240 hour(s)).   Time coordinating discharge: 45 minutes  SIGNED:   Tawni Millers, MD  [facility] 06/09/2017, 9:17 AM Pager [phone]  If 7PM-7AM, please contact night-coverage [email or web address] Password [email or web address]

## 2017-06-11 ENCOUNTER — Telehealth: Payer: Self-pay | Admitting: Family Medicine

## 2017-06-11 ENCOUNTER — Telehealth: Payer: Self-pay

## 2017-06-11 NOTE — Telephone Encounter (Signed)
-----   Message from Mauri Pole, MD sent at 06/09/2017 10:58 AM EST ----- He will need office follow visit in next few weeks and repeat egd in 6-8 weeks. Plan for DC home this weekend Thanks

## 2017-06-11 NOTE — Telephone Encounter (Signed)
Pt scheduled to see Tye Savoy NP 06/27/17@10 :30am. Appt letter mailed to pt.

## 2017-06-11 NOTE — Telephone Encounter (Signed)
I left a voice message for pt to return my call.  

## 2017-06-12 NOTE — Telephone Encounter (Signed)
I closed encounter patient was not eligible for TCM call.

## 2017-06-27 ENCOUNTER — Ambulatory Visit: Payer: Self-pay | Admitting: Nurse Practitioner

## 2017-06-27 ENCOUNTER — Encounter: Payer: Self-pay | Admitting: Family Medicine

## 2017-06-27 ENCOUNTER — Ambulatory Visit (INDEPENDENT_AMBULATORY_CARE_PROVIDER_SITE_OTHER): Payer: Self-pay | Admitting: Family Medicine

## 2017-06-27 VITALS — BP 108/68 | HR 110 | Temp 98.8°F | Wt 103.2 lb

## 2017-06-27 DIAGNOSIS — F1010 Alcohol abuse, uncomplicated: Secondary | ICD-10-CM

## 2017-06-27 DIAGNOSIS — K2210 Ulcer of esophagus without bleeding: Secondary | ICD-10-CM

## 2017-06-27 DIAGNOSIS — K222 Esophageal obstruction: Secondary | ICD-10-CM

## 2017-06-27 DIAGNOSIS — R627 Adult failure to thrive: Secondary | ICD-10-CM

## 2017-06-27 DIAGNOSIS — F17200 Nicotine dependence, unspecified, uncomplicated: Secondary | ICD-10-CM

## 2017-06-27 DIAGNOSIS — E43 Unspecified severe protein-calorie malnutrition: Secondary | ICD-10-CM

## 2017-06-27 MED ORDER — SUCRALFATE 1 GM/10ML PO SUSP: 1.0000 g | Freq: Three times a day (TID) | ORAL | 0 refills | Status: DC

## 2017-06-27 NOTE — Patient Instructions (Addendum)
Try supplement such as Ensure.   Get back on Thiamine 100 mg daily Continue with multivitamin daily
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
## 2017-07-03 ENCOUNTER — Encounter (HOSPITAL_COMMUNITY): Payer: Self-pay

## 2017-07-03 ENCOUNTER — Other Ambulatory Visit: Payer: Self-pay

## 2017-07-03 ENCOUNTER — Inpatient Hospital Stay (HOSPITAL_COMMUNITY)
Admission: EM | Admit: 2017-07-03 | Discharge: 2017-07-10 | DRG: 391 | Disposition: A | Discharge status: Home Health | Payer: Self-pay | Attending: Internal Medicine | Admitting: Internal Medicine

## 2017-07-03 ENCOUNTER — Emergency Department (HOSPITAL_COMMUNITY): Payer: Self-pay

## 2017-07-03 DIAGNOSIS — K449 Diaphragmatic hernia without obstruction or gangrene: Secondary | ICD-10-CM | POA: Diagnosis present

## 2017-07-03 DIAGNOSIS — K219 Gastro-esophageal reflux disease without esophagitis: Secondary | ICD-10-CM | POA: Diagnosis present

## 2017-07-03 DIAGNOSIS — R9431 Abnormal electrocardiogram [ECG] [EKG]: Secondary | ICD-10-CM

## 2017-07-03 DIAGNOSIS — E876 Hypokalemia: Secondary | ICD-10-CM | POA: Diagnosis not present

## 2017-07-03 DIAGNOSIS — J9811 Atelectasis: Secondary | ICD-10-CM | POA: Diagnosis present

## 2017-07-03 DIAGNOSIS — J181 Lobar pneumonia, unspecified organism: Secondary | ICD-10-CM

## 2017-07-03 DIAGNOSIS — K222 Esophageal obstruction: Principal | ICD-10-CM | POA: Diagnosis present

## 2017-07-03 DIAGNOSIS — R0600 Dyspnea, unspecified: Secondary | ICD-10-CM

## 2017-07-03 DIAGNOSIS — R911 Solitary pulmonary nodule: Secondary | ICD-10-CM | POA: Diagnosis present

## 2017-07-03 DIAGNOSIS — F1096 Alcohol use, unspecified with alcohol-induced persisting amnestic disorder: Secondary | ICD-10-CM | POA: Diagnosis present

## 2017-07-03 DIAGNOSIS — F17210 Nicotine dependence, cigarettes, uncomplicated: Secondary | ICD-10-CM | POA: Diagnosis present

## 2017-07-03 DIAGNOSIS — Z681 Body mass index (BMI) 19 or less, adult: Secondary | ICD-10-CM

## 2017-07-03 DIAGNOSIS — E871 Hypo-osmolality and hyponatremia: Secondary | ICD-10-CM | POA: Diagnosis not present

## 2017-07-03 DIAGNOSIS — E43 Unspecified severe protein-calorie malnutrition: Secondary | ICD-10-CM | POA: Diagnosis present

## 2017-07-03 DIAGNOSIS — Z79899 Other long term (current) drug therapy: Secondary | ICD-10-CM

## 2017-07-03 DIAGNOSIS — F17200 Nicotine dependence, unspecified, uncomplicated: Secondary | ICD-10-CM

## 2017-07-03 DIAGNOSIS — F1026 Alcohol dependence with alcohol-induced persisting amnestic disorder: Secondary | ICD-10-CM | POA: Diagnosis present

## 2017-07-03 DIAGNOSIS — R627 Adult failure to thrive: Secondary | ICD-10-CM | POA: Diagnosis present

## 2017-07-03 DIAGNOSIS — R918 Other nonspecific abnormal finding of lung field: Secondary | ICD-10-CM

## 2017-07-03 DIAGNOSIS — Z66 Do not resuscitate: Secondary | ICD-10-CM | POA: Diagnosis present

## 2017-07-03 DIAGNOSIS — R531 Weakness: Secondary | ICD-10-CM

## 2017-07-03 DIAGNOSIS — J189 Pneumonia, unspecified organism: Secondary | ICD-10-CM | POA: Diagnosis present

## 2017-07-03 LAB — COMPREHENSIVE METABOLIC PANEL
ALBUMIN: 3.4 g/dL — AB (ref 3.5–5.0)
ALT: 32 U/L (ref 17–63)
AST: 94 U/L — AB (ref 15–41)
Alkaline Phosphatase: 114 U/L (ref 38–126)
Anion gap: 21 — ABNORMAL HIGH (ref 5–15)
BUN: 10 mg/dL (ref 6–20)
CHLORIDE: 96 mmol/L — AB (ref 101–111)
CO2: 18 mmol/L — AB (ref 22–32)
CREATININE: 0.75 mg/dL (ref 0.61–1.24)
Calcium: 9.5 mg/dL (ref 8.9–10.3)
GFR calc Af Amer: >60 mL/min (ref >60)
GFR calc non Af Amer: >60 mL/min (ref >60)
GLUCOSE: 68 mg/dL (ref 65–99)
Potassium: 4.7 mmol/L (ref 3.5–5.1)
Sodium: 135 mmol/L (ref 135–145)
Total Bilirubin: 1.8 mg/dL — ABNORMAL HIGH (ref 0.3–1.2)
Total Protein: 8.1 g/dL (ref 6.5–8.1)

## 2017-07-03 LAB — CBC WITH DIFFERENTIAL/PLATELET
BASOS ABS: 0 10*3/uL (ref 0.0–0.1)
Basophils Relative: 0 %
EOS PCT: 0 %
Eosinophils Absolute: 0 10*3/uL (ref 0.0–0.7)
HCT: 38.6 % — ABNORMAL LOW (ref 39.0–52.0)
Hemoglobin: 13.4 g/dL (ref 13.0–17.0)
LYMPHS PCT: 25 %
Lymphs Abs: 1.5 10*3/uL (ref 0.7–4.0)
MCH: 29.9 pg (ref 26.0–34.0)
MCHC: 34.7 g/dL (ref 30.0–36.0)
MCV: 86.2 fL (ref 78.0–100.0)
Monocytes Absolute: 0.4 10*3/uL (ref 0.1–1.0)
Monocytes Relative: 7 %
NEUTROS ABS: 3.9 10*3/uL (ref 1.7–7.7)
Neutrophils Relative %: 68 %
PLATELETS: 334 10*3/uL (ref 150–400)
RBC: 4.48 MIL/uL (ref 4.22–5.81)
RDW: 14 % (ref 11.5–15.5)
WBC: 5.8 10*3/uL (ref 4.0–10.5)

## 2017-07-03 LAB — URINALYSIS, ROUTINE W REFLEX MICROSCOPIC
Bilirubin Urine: NEGATIVE
Glucose, UA: NEGATIVE mg/dL
Hgb urine dipstick: NEGATIVE
Ketones, ur: 80 mg/dL — AB
LEUKOCYTES UA: NEGATIVE
NITRITE: NEGATIVE
PROTEIN: NEGATIVE mg/dL
SPECIFIC GRAVITY, URINE: 1.023 (ref 1.005–1.030)
pH: 6 (ref 5.0–8.0)

## 2017-07-03 LAB — LIPASE, BLOOD: LIPASE: 21 U/L (ref 11–51)

## 2017-07-03 LAB — AMMONIA: Ammonia: 38 umol/L — ABNORMAL HIGH (ref 9–35)

## 2017-07-03 LAB — TROPONIN I: Troponin I: <0.03 ng/mL (ref <0.03)

## 2017-07-03 LAB — MAGNESIUM: Magnesium: 1.7 mg/dL (ref 1.7–2.4)

## 2017-07-03 MED ORDER — MORPHINE SULFATE (PF) 2 MG/ML IV SOLN
2.0000 mg | Freq: Once | INTRAVENOUS | Status: AC
  Administered 2017-07-04: 2 mg via INTRAVENOUS
  Filled 2017-07-03: qty 1

## 2017-07-03 MED ORDER — TRAMADOL HCL 50 MG PO TABS
50.0000 mg | ORAL_TABLET | Freq: Four times a day (QID) | ORAL | Status: DC | PRN
Start: 2017-07-03 — End: 2017-07-03
  Administered 2017-07-03: 50 mg via ORAL
  Filled 2017-07-03: qty 1

## 2017-07-03 MED ORDER — PIPERACILLIN-TAZOBACTAM 3.375 G IVPB 30 MIN
3.3750 g | Freq: Once | INTRAVENOUS | Status: AC
  Administered 2017-07-03: 3.375 g via INTRAVENOUS
  Filled 2017-07-03: qty 50

## 2017-07-03 MED ORDER — TRAZODONE HCL 50 MG PO TABS
50.0000 mg | ORAL_TABLET | Freq: Every day | ORAL | Status: DC
  Administered 2017-07-03: 50 mg via ORAL
  Filled 2017-07-03: qty 1

## 2017-07-03 MED ORDER — THIAMINE HCL 100 MG/ML IJ SOLN
100.0000 mg | Freq: Every day | INTRAMUSCULAR | Status: DC
  Administered 2017-07-03 – 2017-07-10 (×8): 100 mg via INTRAVENOUS
  Filled 2017-07-03 (×8): qty 2

## 2017-07-03 MED ORDER — LANSOPRAZOLE 3 MG/ML SUSP: 30.0000 mg | Freq: Two times a day (BID) | ORAL | Status: DC

## 2017-07-03 MED ORDER — ACETAMINOPHEN 325 MG PO TABS
650.0000 mg | ORAL_TABLET | ORAL | Status: DC | PRN
  Administered 2017-07-03: 650 mg via ORAL
  Filled 2017-07-03: qty 2

## 2017-07-03 MED ORDER — DEXTROSE-NACL 5-0.9 % IV SOLN
INTRAVENOUS | Status: DC
  Administered 2017-07-03 – 2017-07-07 (×7): via INTRAVENOUS

## 2017-07-03 MED ORDER — SODIUM CHLORIDE 0.9 % IV BOLUS
500.0000 mL | Freq: Once | INTRAVENOUS | Status: AC
  Administered 2017-07-03: 500 mL via INTRAVENOUS

## 2017-07-03 MED ORDER — SUCRALFATE 1 GM/10ML PO SUSP: 1.0000 g | Freq: Three times a day (TID) | ORAL | Status: DC

## 2017-07-03 MED ORDER — VANCOMYCIN HCL IN DEXTROSE 750-5 MG/150ML-% IV SOLN
750.0000 mg | INTRAVENOUS | Status: DC
  Filled 2017-07-03: qty 150

## 2017-07-03 MED ORDER — TRAMADOL 5 MG/ML ORAL SUSPENSION: 50.0000 mg | Freq: Four times a day (QID) | ORAL | Status: DC | PRN

## 2017-07-03 MED ORDER — SUCRALFATE 1 GM/10ML PO SUSP
1.0000 g | Freq: Four times a day (QID) | ORAL | Status: DC
  Administered 2017-07-04 – 2017-07-06 (×9): 1 g via ORAL
  Filled 2017-07-03 (×8): qty 10

## 2017-07-03 MED ORDER — PIPERACILLIN-TAZOBACTAM 3.375 G IVPB
3.3750 g | Freq: Three times a day (TID) | INTRAVENOUS | Status: DC
  Administered 2017-07-03 – 2017-07-05 (×5): 3.375 g via INTRAVENOUS
  Filled 2017-07-03 (×5): qty 50

## 2017-07-03 MED ORDER — HEPARIN SODIUM (PORCINE) 5000 UNIT/ML IJ SOLN
5000.0000 [IU] | Freq: Three times a day (TID) | INTRAMUSCULAR | Status: DC
  Administered 2017-07-03 – 2017-07-05 (×5): 5000 [IU] via SUBCUTANEOUS
  Filled 2017-07-03 (×5): qty 1

## 2017-07-03 MED ORDER — TRAMADOL HCL 50 MG PO TABS
50.0000 mg | ORAL_TABLET | Freq: Four times a day (QID) | ORAL | Status: DC | PRN
  Administered 2017-07-03: 50 mg via ORAL
  Administered 2017-07-04: 100 mg via ORAL
  Filled 2017-07-03 (×2): qty 2

## 2017-07-03 MED ORDER — ACETAMINOPHEN 325 MG PO TABS: 650.0000 mg | ORAL_TABLET | ORAL | Status: DC | PRN

## 2017-07-03 MED ORDER — BOOST PLUS PO LIQD
237.0000 mL | Freq: Two times a day (BID) | ORAL | Status: DC
  Administered 2017-07-04 – 2017-07-10 (×5): 237 mL via ORAL
  Filled 2017-07-03 (×15): qty 237

## 2017-07-03 MED ORDER — ALBUTEROL SULFATE HFA 108 (90 BASE) MCG/ACT IN AERS
2.0000 | INHALATION_SPRAY | RESPIRATORY_TRACT | Status: DC | PRN
  Filled 2017-07-03: qty 6.7

## 2017-07-03 MED ORDER — SODIUM CHLORIDE 0.9 % IV SOLN
80.0000 mg | Freq: Two times a day (BID) | INTRAVENOUS | Status: DC
  Administered 2017-07-03 – 2017-07-05 (×4): 80 mg via INTRAVENOUS
  Filled 2017-07-03 (×4): qty 80

## 2017-07-03 MED ORDER — SODIUM CHLORIDE 0.9% FLUSH
3.0000 mL | Freq: Two times a day (BID) | INTRAVENOUS | Status: DC
  Administered 2017-07-03 – 2017-07-08 (×6): 3 mL via INTRAVENOUS

## 2017-07-03 MED ORDER — VANCOMYCIN HCL IN DEXTROSE 1-5 GM/200ML-% IV SOLN
1000.0000 mg | Freq: Once | INTRAVENOUS | Status: AC
  Administered 2017-07-03: 1000 mg via INTRAVENOUS
  Filled 2017-07-03: qty 200

## 2017-07-03 MED ORDER — NICOTINE 21 MG/24HR TD PT24
21.0000 mg | MEDICATED_PATCH | Freq: Every day | TRANSDERMAL | Status: DC
  Administered 2017-07-03 – 2017-07-10 (×8): 21 mg via TRANSDERMAL
  Filled 2017-07-03 (×8): qty 1

## 2017-07-03 MED ORDER — ADULT MULTIVITAMIN LIQUID CH
15.0000 mL | Freq: Every day | ORAL | Status: DC
  Administered 2017-07-04 – 2017-07-10 (×6): 15 mL via ORAL
  Filled 2017-07-03 (×7): qty 15

## 2017-07-03 NOTE — Progress Notes (Signed)
Pharmacy Antibiotic Note  Patrick [name] is a 67 y.o. male admitted on 07/03/2017 with pneumonia, FTT, esophageal stenosis, severe ulcerative esophagitis, EtOH use with Korsakoff syndrone, QTc prolongation.  Pharmacy has been consulted for Vancomycin and Zosyn dosing.  Afebrile WBC 5.8 SCr 0.75, CrCl ~ 59 ml/min  Plan:  Zosyn 3.375g IV Q8H infused over 4hrs.  Vancomycin 1g IV x1 then 750 mg IV q24h.  Daily SCr  Measure Vanc peak, trough at steady state.  Goal AUC 400-500.  Follow up renal fxn, culture results, and clinical course.    Height: 5\' 7"  (170.2 cm) Weight: 100 lb (45.4 kg) IBW/kg (Calculated) : 66.1  Temp (24hrs), Avg:97.9 F (36.6 C), Min:97.9 F (36.6 C), Max:97.9 F (36.6 C)  Recent Labs  Lab 07/03/17 1203  WBC 5.8  CREATININE 0.75    Estimated Creatinine Clearance: 59.9 mL/min (by C-G formula based on SCr of 0.75 mg/dL).    No Known Allergies  Antimicrobials this admission: 3/26 Vancomycin >> 3/26 Zosyn >>    Thank you for allowing pharmacy to be a part of this patient's care.  Gretta Arab PharmD, BCPS Pager [phone] 07/03/2017 4:15 PM

## 2017-07-03 NOTE — ED Provider Notes (Signed)
[facility] DEPT Provider Note   CSN: [id number] Arrival date & time: 07/03/17  1012     History   Chief Complaint Chief Complaint  Patient presents with  . Nausea    HPI Patrick [name] is a 67 y.o. male.  HPI Patient with history of GERD and severe esophageal stenosis presents with decreased p.o. intake over the last week.  States he is only tolerating milk and ice cream.  Also has had generalized weakness for the past 2 days.  Normally is able to ambulate without assistance.  States he has not been able to get up out of bed.  Lives home alone with no help.  Patient states he has chronic cough which is worse in the last 2 months.  Denies any fever or chills.  No blood in the stool or vomit.  States he has had a 50 pound weight loss over the last year.  Was recently hospitalized for similar symptoms and offered PEG tube placement.  States he does not want feeding tube. Past Medical History:  Diagnosis Date  . Colon polyps   . Depression   . Diverticulitis   . Diverticulosis of colon   . GERD (gastroesophageal reflux disease)   . Kidney stone     Patient Active Problem List   Diagnosis Date Noted  . Odynophagia   . Stricture and stenosis of esophagus   . Acute encephalopathy 06/05/2017  . Hypercalcemia 06/05/2017  . Weight loss 06/05/2017  . Thrombocytosis ([place]) 06/05/2017  . Encephalopathy acute 06/05/2017  . Encephalopathy 06/05/2017  . Failure to thrive in adult 04/20/2017  . Adjustment disorder with depressed mood 04/15/2017  . Protein-calorie malnutrition, severe 04/12/2017  . Palliative care by specialist   . DNR (do not resuscitate)   . GI bleed 04/10/2017  . FTT (failure to thrive) in adult 04/09/2017  . Weakness   . Alcohol withdrawal ([place]) 01/04/2017  . Esophagitis 01/04/2017  . Decubitus ulcer of buttock, stage 1   . Upper GI bleed 12/20/2016  . AKI (acute kidney injury) ([place]) 12/20/2016  . 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 Encounter for palliative care   . Goals of care, counseling/discussion   . Dysphagia 11/30/2016  . Anemia 11/25/2016  . Pressure ulcer of sacral region, stage 2 11/25/2016  . Alcohol abuse 11/25/2016  . Hypotension 11/25/2016  . Hypomagnesemia 11/25/2016  . Hyponatremia 11/25/2016  . Non-traumatic rhabdomyolysis 11/25/2016  . Erosive esophagitis 05/14/2015  . Hypokalemia 04/30/2015  . Thrombocytopenia ([place]) 04/16/2015  . Alcohol dependence ([place]) 03/17/2015  . Change in bowel habits 02/23/2015  . Rectal bleeding 02/23/2015  . Constipation 02/23/2015  . Transaminasemia 03/27/2013  . Essential tremor 03/27/2013  . Hypertension 07/19/2012  . TOBACCO USE 03/01/2010  . DIVERTICULOSIS, COLON 05/14/2007  . DEPRESSION 11/05/2006  . COLONIC POLYPS, HX OF 11/05/2006    Past Surgical History:  Procedure Laterality Date  . COLONOSCOPY    . ESOPHAGOGASTRODUODENOSCOPY N/A 06/06/2017   Procedure: ESOPHAGOGASTRODUODENOSCOPY (EGD);  Surgeon: Jerene Bears, MD;  Location: [facility];  Service: Gastroenterology;  Laterality: N/A;  . ESOPHAGOGASTRODUODENOSCOPY (EGD) WITH PROPOFOL N/A 12/21/2016   Procedure: ESOPHAGOGASTRODUODENOSCOPY (EGD) WITH PROPOFOL;  Surgeon: Doran Stabler, MD;  Location: [facility];  Service: Gastroenterology;  Laterality: N/A;  . UPPER GASTROINTESTINAL ENDOSCOPY          Home Medications    Prior to Admission medications   Medication Sig Start Date End Date Taking? Authorizing Provider  albuterol (VENTOLIN HFA)  108 (90 BASE) MCG/ACT inhaler Inhale 2 puffs into the lungs every 4 (four) hours as needed for wheezing or shortness of breath. 08/17/14  Yes Burchette, Alinda Sierras, MD  lactose free nutrition (BOOST PLUS) LIQD Take 237 mLs by mouth 2 (two) times daily between meals. 06/09/17 07/09/17 Yes Arrien, Jimmy Picket, MD  magnesium oxide (MAG-OX) 400 (241.3 Mg) MG tablet Take 1 tablet (400 mg total) by mouth daily. 04/28/17  Yes Theodis Blaze, MD  Multiple Vitamin (MULTIVITAMIN) LIQD Take 15 mLs by mouth daily. 06/10/17 07/10/17 Yes Arrien, Jimmy Picket, MD  nicotine (NICODERM CQ - DOSED IN MG/24 HOURS) 21 mg/24hr patch Place 1 patch (21 mg total) onto the skin daily. 12/02/16  Yes Short, Noah Delaine, MD  pantoprazole (PROTONIX) 40 MG tablet Take 1 tablet (40 mg total) by mouth 2 (two) times daily. 05/23/17  Yes Willia Craze, NP  sucralfate (CARAFATE) 1 GM/10ML suspension Take 10 mLs (1 g total) by mouth 4 (four) times daily -  with meals and at bedtime. 06/27/17  Yes Burchette, Alinda Sierras, MD  traZODone (DESYREL) 50 MG tablet Take 1 tablet (50 mg total) by mouth at bedtime. 05/14/17  Yes Burchette, Alinda Sierras, MD  thiamine 100 MG tablet Take 1 tablet (100 mg total) by mouth daily. Patient not taking: Reported on 06/27/2017 12/02/16   Janece Canterbury, MD    Family History Family History  Problem Relation Age of Onset  . Heart disease Father 37       cabg    Social History Social History   Tobacco Use  . Smoking status: Current Every Day Smoker    Packs/day: 1.00    Types: Cigarettes  . Smokeless tobacco: Never Used  Substance Use Topics  . Alcohol use: Yes    Alcohol/week: 8.4 oz    Types: 14 Glasses of wine per week  . Drug use: No     Allergies   Patient has no known allergies.   Review of Systems Review of Systems  Constitutional: Positive for activity change, appetite change, chills, fatigue and unexpected weight change. Negative for fever.  HENT: Positive for trouble swallowing. Negative for congestion, sinus pressure, sinus pain and sore throat.   Respiratory: Positive for cough. Negative for chest tightness and shortness of breath.   Cardiovascular: Negative for chest pain, palpitations and leg swelling.  Gastrointestinal: Positive for abdominal pain, nausea and vomiting. Negative for blood in stool, constipation and diarrhea.  Genitourinary: Negative for dysuria, flank pain, frequency and hematuria.    Musculoskeletal: Negative for back pain, myalgias, neck pain and neck stiffness.  Skin: Negative for rash and wound.  Neurological: Positive for weakness. Negative for dizziness, syncope, light-headedness, numbness and headaches.  All other systems reviewed and are negative.    Physical Exam Updated Vital Signs BP 129/85   Pulse (!) 116   Temp 97.9 F (36.6 C) (Oral)   Resp (!) 23   Ht 5\' 7"  (1.702 m)   Wt 45.4 kg (100 lb)   SpO2 100%   BMI 15.66 kg/m   Physical Exam  Constitutional: He is oriented to person, place, and time. He appears well-developed.  Cachectic appearance.  Deconditioned appearing  HENT:  Head: Normocephalic.  Mouth/Throat: Oropharynx is clear and moist. No oropharyngeal exudate.  Patient has a healing right scalp laceration.  Eyes: Pupils are equal, round, and reactive to light. EOM are normal.  Neck: Normal range of motion. Neck supple.  No posterior midline cervical tenderness to palpation.  Cardiovascular: Regular  rhythm. Exam reveals no gallop and no friction rub.  No murmur heard. Tachycardia  Pulmonary/Chest: Effort normal. He has rales.  Rales especially in the right lung base.  Abdominal: Soft. Bowel sounds are normal. There is tenderness. There is no rebound and no guarding.  Epigastric tenderness to palpation without rebound or guarding.  Musculoskeletal: Normal range of motion. He exhibits no edema or tenderness.  No midline thoracic or lumbar tenderness.  Stable.  Distal pulses are 2+.  Neurological: He is alert and oriented to person, place, and time.  4/5 motor all extremities.  Sensation fully intact.  Skin: Skin is warm and dry. Capillary refill takes less than 2 seconds. No rash noted. No erythema.  Psychiatric: He has a normal mood and affect. His behavior is normal.  Nursing note and vitals reviewed.    ED Treatments / Results  Labs (all labs ordered are listed, but only abnormal results are displayed) Labs Reviewed  CBC WITH  DIFFERENTIAL/PLATELET - Abnormal; Notable for the following components:      Result Value   HCT 38.6 (*)    All other components within normal limits  COMPREHENSIVE METABOLIC PANEL - Abnormal; Notable for the following components:   Chloride 96 (*)    CO2 18 (*)    Albumin 3.4 (*)    AST 94 (*)    Total Bilirubin 1.8 (*)    Anion gap 21 (*)    All other components within normal limits  AMMONIA - Abnormal; Notable for the following components:   Ammonia 38 (*)    All other components within normal limits  LIPASE, BLOOD  TROPONIN I  MAGNESIUM  URINALYSIS, ROUTINE W REFLEX MICROSCOPIC    EKG EKG Interpretation  Date/Time:  Tuesday July 03 2017 12:06:43 EDT Ventricular Rate:  113 PR Interval:    QRS Duration: 88 QT Interval:  386 QTC Calculation: 530 R Axis:   84 Text Interpretation:  Sinus tachycardia Borderline right axis deviation Borderline T abnormalities, anterior leads Prolonged QT interval Baseline wander in lead(s) II V3 Confirmed by Julianne Rice [phone]) on 07/03/2017 2:48:52 PM   Radiology Dg Chest 2 View  Result Date: 07/03/2017 CLINICAL DATA:  Nausea and vomiting, worsening over the last week. EXAM: CHEST - 2 VIEW COMPARISON:  06/05/2017 FINDINGS: Heart size is normal. Right chest is clear. Hiatal hernia again noted. Worsened patchy density at the left base consistent with bronchopneumonia. Aortic atherosclerosis again noted. IMPRESSION: Worsened patchy density at the left base consistent with bronchopneumonia. Hiatal hernia as seen previously. Aortic atherosclerosis. Electronically Signed   By: Nelson Chimes M.D.   On: 07/03/2017 13:23   Ct Head Wo Contrast  Result Date: 07/03/2017 CLINICAL DATA:  Altered mental status.  Nausea and vomiting EXAM: CT HEAD WITHOUT CONTRAST TECHNIQUE: Contiguous axial images were obtained from the base of the skull through the vertex without intravenous contrast. COMPARISON:  Head CT June 05, 2017 and brain MRI June 05, 2017  FINDINGS: Brain: There is mild diffuse atrophy. There is no intracranial mass, hemorrhage, extra-axial fluid collection, or midline shift. There is patchy small vessel disease in the centra semiovale bilaterally. Elsewhere gray-white compartments appear unremarkable. No acute infarct evident. Vascular: No hyperdense vessel. There is calcification in each distal carotid siphon region. Skull: The bony calvarium appears intact. Sinuses/Orbits: There is mild mucosal thickening in several ethmoid air cells. Other visualized paranasal sinuses are clear. Orbits appear symmetric bilaterally. Other: Mastoid air cells are clear. IMPRESSION: Mild atrophy with patchy periventricular small vessel disease.  No acute infarct evident. No mass or hemorrhage. There are foci of arterial vascular calcification. There is mucosal thickening in several ethmoid air cells. Electronically Signed   By: Lowella Grip III M.D.   On: 07/03/2017 12:31    Procedures Procedures (including critical care time)  Medications Ordered in ED Medications  vancomycin (VANCOCIN) IVPB 1000 mg/200 mL premix (1,000 mg Intravenous New Bag/Given 07/03/17 1358)  acetaminophen (TYLENOL) tablet 650 mg (650 mg Oral Refused 07/03/17 1410)  sodium chloride 0.9 % bolus 500 mL (500 mLs Intravenous Given 07/03/17 1205)  piperacillin-tazobactam (ZOSYN) IVPB 3.375 g (3.375 g Intravenous New Bag/Given 07/03/17 1358)  sodium chloride 0.9 % bolus 500 mL (500 mLs Intravenous New Bag/Given 07/03/17 1410)     Initial Impression / Assessment and Plan / ED Course  I have reviewed the triage vital signs and the nursing notes.  Pertinent labs & imaging results that were available during my care of the patient were reviewed by me and considered in my medical decision making (see chart for details).     Patient with left lower lobe infiltrate on x-ray.  Will treat for likely pneumonia.  Also given IV fluids.  Patient with generalized weakness and no focal  abnormality on neurologic exam or CT head.  Discussed with hospitalist who will see patient in the emergency department and admit.  Final Clinical Impressions(s) / ED Diagnoses   Final diagnoses:  Generalized weakness  Pulmonary infiltrate  Esophageal stricture    ED Discharge Orders    None       Julianne Rice, MD 07/03/17 [phone]
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
## 2017-07-03 NOTE — ED Notes (Signed)
Pt called out reporting he needs something for pain, something to drink, and sts he peed the bed.  When asked if he called out for restroom assistance, Pt reports he did not.  This Probation officer and [place] EMT did a full linen change and placed the Pt into a gown.  Belongings placed in a bag and on the counter under the linens.  Pt informed nothing was ordered for pain, his RN was going to let the EDP know, and he cannot have anything to drink until results are back.  Urinal and emesis bag placed beside Pt.  Pt verbalized understanding.  Primary RN and NT updated.

## 2017-07-03 NOTE — Consult Note (Addendum)
Consultation  Referring Provider:  Dr. Bonner Puna  Primary Care Physician:  [name], MD Primary Gastroenterologist:   Dr. Fuller Plan      Reason for Consultation: Failure to thrive            HPI:   Patrick [name] is a 67 y.o. male with a history of alcohol abuse and reflux as well as severe esophagitis, recent hospitalization in February with EGD showing severe esophageal stenosis that cannot be passed by pediatric upper endoscope, returns to the ER today for failure to thrive.    Please see recent consult note 06/05/17 for complete a review of history.    Today, patient is a very poor historian and does not tell much, but repeatedly says, "I hurt all over and I want morphine".  Unable to tell me where he hurts more or less.  He tells me "I just want to die".  Describes drinking possibly a few ounces of milk/ice cream daily, but "anytime I put something in solid by mouth it comes right back up".  In discussion of his previous workup with EGD patient tells me he is unaware of findings of esophageal stenosis or recommendations for PEG tube in the past.  Tells me that he would rather "just die".  Explains that he was just given pills when he left the hospital last and these are "really hard to take".  Essentially he has not been on medicine since leaving.    Denies fever or chills.  Recent GI history: 06/06/17-EGD, Dr. Hilarie Fredrickson: Severe circumferential ulcerative esophagitis, severe esophageal stenosis that could not be passed a pediatric upper endoscope; patient placed on liquid PPI twice daily and liquid sucralfate 4 times daily, alternative nutrition was recommended that time other TPN repeat PEG (Other studies in chart review)  Past Medical History:  Diagnosis Date  . Colon polyps   . Depression   . Diverticulitis   . Diverticulosis of colon   . GERD (gastroesophageal reflux disease)   . Kidney stone     Past Surgical History:  Procedure Laterality Date  . COLONOSCOPY    .  ESOPHAGOGASTRODUODENOSCOPY N/A 06/06/2017   Procedure: ESOPHAGOGASTRODUODENOSCOPY (EGD);  Surgeon: Jerene Bears, MD;  Location: [facility];  Service: Gastroenterology;  Laterality: N/A;  . ESOPHAGOGASTRODUODENOSCOPY (EGD) WITH PROPOFOL N/A 12/21/2016   Procedure: ESOPHAGOGASTRODUODENOSCOPY (EGD) WITH PROPOFOL;  Surgeon: Doran Stabler, MD;  Location: [facility];  Service: Gastroenterology;  Laterality: N/A;  . UPPER GASTROINTESTINAL ENDOSCOPY      Family History  Problem Relation Age of Onset  . Heart disease Father 33       cabg     Social History   Tobacco Use  . Smoking status: Current Every Day Smoker    Packs/day: 1.00    Types: Cigarettes  . Smokeless tobacco: Never Used  Substance Use Topics  . Alcohol use: Yes    Alcohol/week: 8.4 oz    Types: 14 Glasses of wine per week  . Drug use: No    Prior to Admission medications   Medication Sig Start Date End Date Taking? Authorizing Provider  albuterol (VENTOLIN HFA) 108 (90 BASE) MCG/ACT inhaler Inhale 2 puffs into the lungs every 4 (four) hours as needed for wheezing or shortness of breath. 08/17/14  Yes Burchette, Alinda Sierras, MD  lactose free nutrition (BOOST PLUS) LIQD Take 237 mLs by mouth 2 (two) times daily between meals. 06/09/17 07/09/17 Yes Arrien, Jimmy Picket, MD  magnesium oxide (MAG-OX) 400 (241.3  Mg) MG tablet Take 1 tablet (400 mg total) by mouth daily. 04/28/17  Yes Theodis Blaze, MD  Multiple Vitamin (MULTIVITAMIN) LIQD Take 15 mLs by mouth daily. 06/10/17 07/10/17 Yes Arrien, Jimmy Picket, MD  nicotine (NICODERM CQ - DOSED IN MG/24 HOURS) 21 mg/24hr patch Place 1 patch (21 mg total) onto the skin daily. 12/02/16  Yes Short, Noah Delaine, MD  pantoprazole (PROTONIX) 40 MG tablet Take 1 tablet (40 mg total) by mouth 2 (two) times daily. 05/23/17  Yes Willia Craze, NP  sucralfate (CARAFATE) 1 GM/10ML suspension Take 10 mLs (1 g total) by mouth 4 (four) times daily -  with meals and at bedtime. 06/27/17  Yes  Burchette, Alinda Sierras, MD  traZODone (DESYREL) 50 MG tablet Take 1 tablet (50 mg total) by mouth at bedtime. 05/14/17  Yes Burchette, Alinda Sierras, MD  thiamine 100 MG tablet Take 1 tablet (100 mg total) by mouth daily. Patient not taking: Reported on 06/27/2017 12/02/16   Janece Canterbury, MD    Current Facility-Administered Medications  Medication Dose Route Frequency Provider Last Rate Last Dose  . acetaminophen (TYLENOL) tablet 650 mg  650 mg Oral Q4H PRN Julianne Rice, MD   650 mg at 07/03/17 1358  . dextrose 5 %-0.9 % sodium chloride infusion   Intravenous Continuous Patrecia Pour, MD       Current Outpatient Medications  Medication Sig Dispense Refill  . albuterol (VENTOLIN HFA) 108 (90 BASE) MCG/ACT inhaler Inhale 2 puffs into the lungs every 4 (four) hours as needed for wheezing or shortness of breath. 1 Inhaler 1  . lactose free nutrition (BOOST PLUS) LIQD Take 237 mLs by mouth 2 (two) times daily between meals. 14220 mL 0  . magnesium oxide (MAG-OX) 400 (241.3 Mg) MG tablet Take 1 tablet (400 mg total) by mouth daily. 30 tablet 1  . Multiple Vitamin (MULTIVITAMIN) LIQD Take 15 mLs by mouth daily. 450 mL 0  . nicotine (NICODERM CQ - DOSED IN MG/24 HOURS) 21 mg/24hr patch Place 1 patch (21 mg total) onto the skin daily. 28 patch 0  . pantoprazole (PROTONIX) 40 MG tablet Take 1 tablet (40 mg total) by mouth 2 (two) times daily. 60 tablet 3  . sucralfate (CARAFATE) 1 GM/10ML suspension Take 10 mLs (1 g total) by mouth 4 (four) times daily -  with meals and at bedtime. 420 mL 0  . traZODone (DESYREL) 50 MG tablet Take 1 tablet (50 mg total) by mouth at bedtime. 30 tablet 2  . thiamine 100 MG tablet Take 1 tablet (100 mg total) by mouth daily. (Patient not taking: Reported on 06/27/2017) 30 tablet 0    Allergies as of 07/03/2017  . (No Known Allergies)     Review of Systems:    Constitutional: No fever Skin: No rash  Cardiovascular: No chest pain Respiratory: + cough,  SOB Gastrointestinal: See HPI and otherwise negative Genitourinary: No dysuria  Neurological: No headache Musculoskeletal:+ muscle and joint pain Hematologic: No bleeding  Psychiatric: +depression   Physical Exam:  Vital signs in last 24 hours: Temp:  [97.9 F (36.6 C)] 97.9 F (36.6 C) (03/26 1024) Pulse Rate:  [95-116] 116 (03/26 1400) Resp:  [16-23] 23 (03/26 1400) BP: (117-129)/(85-89) 129/85 (03/26 1400) SpO2:  [97 %-100 %] 100 % (03/26 1400) Weight:  [100 lb (45.4 kg)] 100 lb (45.4 kg) (03/26 1025)   General:   Cachectic, chronically ill appearing Caucasian male appears to be in NAD Head:  Normocephalic and atraumatic. Temporal wasting  Eyes:   PEERL, EOMI. No icterus. Conjunctiva pink. Ears:  Normal auditory acuity. Neck:  Supple Throat: Oral cavity and pharynx without inflammation, swelling or lesion. poor dentition Lungs: Respirations even and unlabored. Decreased, Rhonchi left base Heart: Normal S1, S2. No MRG. Regular rate and rhythm. No peripheral edema, cyanosis or pallor.  Abdomen:  Soft, nondistended, nontender. No rebound or guarding. Normal bowel sounds. No appreciable masses or hepatomegaly. Rectal:  Not performed.  Msk:  Diffuse muscular atrophy, Symmetrical without gross deformities. Extremities:  Without edema, no deformity or joint abnormality. Normal ROM. Neurologic:  Alert and  oriented x4;  grossly normal neurologically.   Skin:   Dry and intact without significant lesions or rashes. Psychiatric: Suicidal   LAB RESULTS: Recent Labs    07/03/17 1203  WBC 5.8  HGB 13.4  HCT 38.6*  PLT 334   BMET Recent Labs    07/03/17 1203  NA 135  K 4.7  CL 96*  CO2 18*  GLUCOSE 68  BUN 10  CREATININE 0.75  CALCIUM 9.5   LFT Recent Labs    07/03/17 1203  PROT 8.1  ALBUMIN 3.4*  AST 94*  ALT 32  ALKPHOS 114  BILITOT 1.8*   STUDIES: Dg Chest 2 View  Result Date: 07/03/2017 CLINICAL DATA:  Nausea and vomiting, worsening over the last week.  EXAM: CHEST - 2 VIEW COMPARISON:  06/05/2017 FINDINGS: Heart size is normal. Right chest is clear. Hiatal hernia again noted. Worsened patchy density at the left base consistent with bronchopneumonia. Aortic atherosclerosis again noted. IMPRESSION: Worsened patchy density at the left base consistent with bronchopneumonia. Hiatal hernia as seen previously. Aortic atherosclerosis. Electronically Signed   By: Nelson Chimes M.D.   On: 07/03/2017 13:23   Ct Head Wo Contrast  Result Date: 07/03/2017 CLINICAL DATA:  Altered mental status.  Nausea and vomiting EXAM: CT HEAD WITHOUT CONTRAST TECHNIQUE: Contiguous axial images were obtained from the base of the skull through the vertex without intravenous contrast. COMPARISON:  Head CT June 05, 2017 and brain MRI June 05, 2017 FINDINGS: Brain: There is mild diffuse atrophy. There is no intracranial mass, hemorrhage, extra-axial fluid collection, or midline shift. There is patchy small vessel disease in the centra semiovale bilaterally. Elsewhere gray-white compartments appear unremarkable. No acute infarct evident. Vascular: No hyperdense vessel. There is calcification in each distal carotid siphon region. Skull: The bony calvarium appears intact. Sinuses/Orbits: There is mild mucosal thickening in several ethmoid air cells. Other visualized paranasal sinuses are clear. Orbits appear symmetric bilaterally. Other: Mastoid air cells are clear. IMPRESSION: Mild atrophy with patchy periventricular small vessel disease. No acute infarct evident. No mass or hemorrhage. There are foci of arterial vascular calcification. There is mucosal thickening in several ethmoid air cells. Electronically Signed   By: Lowella Grip III M.D.   On: 07/03/2017 12:31   EXAM: ESOPHOGRAM/BARIUM SWALLOW  TECHNIQUE: Single contrast examination was performed using thin barium and water soluble.  FLUOROSCOPY TIME:  Fluoroscopy Time:  4.5 minutes  Radiation Exposure Index (if  provided by the fluoroscopic device): 32.3 mGy  Number of Acquired Spot Images: 0  COMPARISON:  Chest radiograph 06/05/2017; CT chest 11/29/2016  FINDINGS: Exam was initiated with water-soluble contrast and completed with thin barium. The contrast passed through the esophagus into the stomach. There is a moderate hiatal hernia.  Within the distal third of the esophagus there is a persistent stricture which measures approximately 3.3 cm in length (image 247; series 3).  Additionally, there are findings  suggestive of esophageal wall thickening.  IMPRESSION: 1. There is a persistent distal esophageal stricture proximal to the moderate hiatal hernia measuring approximately 3.3 cm in length. Considerations include malignant or infectious etiologies. 2. Moderate hiatal hernia.   Electronically Signed   By: Lovey Newcomer M.D.   On: 06/07/2017 12:02   Impression / Plan:   Impression: 1.  Severe ulcerative esophagitis: Last EGD 06/06/17, see HPI severe stenosis, also recent esophagram showing a 3.3 cm length stricture and moderate hiatal hernia, causing FTT 2.  Failure to thrive: BMI now 15  Plan: 1.  Recommend enteral feeding with G tube (placed by IR) 2.  Continue current supportive measures 3. Added Lansoprazole suspension 30mg  BID and Carafate suspension 1G q6h 4.  Please await final recommendations from Dr. Ardis Hughs later today.  Thank you for your kind consultation.  Lavone Nian [facility]  07/03/2017, 3:58 PM Pager #: [phone]    ________________________________________________________________________  Velora Heckler GI MD note:  I personally examined the patient, reviewed the data and agree with the assessment and plan described above. He has a very tight, GERD related distal esophageal stricture.  He has not been taking PPI since last EGD about a month ago. He is unable to take in adequate nutrition because of the GERD related stricture and is withering; has lost  50 pounds in 6 months, is cachectic appearing now with a BMI 15.  I told him he is likely going to die unless he gets adequate nutrition and strongly recommended a feeding tube (radiologically placed G tube).  He says he just wants to die but may agree to a feeding tube.  I will leave that to primary service to hash out.  If he agrees to this probably life saving g-tube then we could plan on serial endoscopic stricture dilations. This would take several EGDs spaced out of many weeks.  He would absolutely need to be on maximum medical acid control throughout as well (PPI twice daily).  I think agreeing to serial dilations without addressing his dire nutritional needs (with G tube) would be futile.  Likewise, serial dilations would be futile if he will not take basic medicines to help himself along (PPI twice daily).  We will follow along.  Owens Loffler, MD [facility] Pager [phone]

## 2017-07-03 NOTE — ED Triage Notes (Signed)
EMS reports from home nausea and vomiting only when eating  increasing over last week, Pt states having these issues for a couple of years, generalized pain and weakness x 1 week and cough x 2 months.  BP 120/90 HR 120 Resp 18 Sp02 100 RA CBG 139

## 2017-07-04 ENCOUNTER — Inpatient Hospital Stay (HOSPITAL_COMMUNITY): Payer: Self-pay

## 2017-07-04 DIAGNOSIS — Z515 Encounter for palliative care: Secondary | ICD-10-CM

## 2017-07-04 DIAGNOSIS — Z7189 Other specified counseling: Secondary | ICD-10-CM

## 2017-07-04 LAB — MAGNESIUM: Magnesium: 1.4 mg/dL — ABNORMAL LOW (ref 1.7–2.4)

## 2017-07-04 LAB — BASIC METABOLIC PANEL
ANION GAP: 17 — AB (ref 5–15)
BUN: 10 mg/dL (ref 6–20)
CO2: 20 mmol/L — AB (ref 22–32)
Calcium: 8.9 mg/dL (ref 8.9–10.3)
Chloride: 95 mmol/L — ABNORMAL LOW (ref 101–111)
Creatinine, Ser: 0.75 mg/dL (ref 0.61–1.24)
GFR calc Af Amer: >60 mL/min (ref >60)
GFR calc non Af Amer: >60 mL/min (ref >60)
Glucose, Bld: 167 mg/dL — ABNORMAL HIGH (ref 65–99)
POTASSIUM: 3.4 mmol/L — AB (ref 3.5–5.1)
Sodium: 132 mmol/L — ABNORMAL LOW (ref 135–145)

## 2017-07-04 LAB — PHOSPHORUS: Phosphorus: 2.4 mg/dL — ABNORMAL LOW (ref 2.5–4.6)

## 2017-07-04 MED ORDER — POTASSIUM CHLORIDE 10 MEQ/100ML IV SOLN
10.0000 meq | INTRAVENOUS | Status: AC
  Administered 2017-07-04 – 2017-07-05 (×4): 10 meq via INTRAVENOUS
  Filled 2017-07-04 (×4): qty 100

## 2017-07-04 MED ORDER — TRAMADOL HCL 50 MG PO TABS
50.0000 mg | ORAL_TABLET | Freq: Four times a day (QID) | ORAL | Status: DC | PRN
  Filled 2017-07-04: qty 1

## 2017-07-04 MED ORDER — ALBUTEROL SULFATE (2.5 MG/3ML) 0.083% IN NEBU: 2.5000 mg | INHALATION_SOLUTION | RESPIRATORY_TRACT | Status: DC | PRN

## 2017-07-04 MED ORDER — PROCHLORPERAZINE EDISYLATE 5 MG/ML IJ SOLN
10.0000 mg | Freq: Once | INTRAMUSCULAR | Status: AC
  Administered 2017-07-04: 10 mg via INTRAVENOUS
  Filled 2017-07-04: qty 2

## 2017-07-04 MED ORDER — MORPHINE SULFATE (PF) 4 MG/ML IV SOLN
3.0000 mg | Freq: Once | INTRAVENOUS | Status: AC
  Administered 2017-07-04: 3 mg via INTRAVENOUS
  Filled 2017-07-04: qty 1

## 2017-07-04 MED ORDER — MAGNESIUM SULFATE 2 GM/50ML IV SOLN
2.0000 g | Freq: Once | INTRAVENOUS | Status: AC
  Administered 2017-07-04: 2 g via INTRAVENOUS
  Filled 2017-07-04: qty 50

## 2017-07-04 MED ORDER — MORPHINE SULFATE (PF) 2 MG/ML IV SOLN
1.0000 mg | INTRAVENOUS | Status: DC | PRN
  Administered 2017-07-04: 1 mg via INTRAVENOUS
  Filled 2017-07-04: qty 1

## 2017-07-04 MED ORDER — MORPHINE SULFATE (PF) 4 MG/ML IV SOLN
1.0000 mg | INTRAVENOUS | Status: DC | PRN
  Administered 2017-07-04 – 2017-07-06 (×5): 2 mg via INTRAVENOUS
  Administered 2017-07-06: 1 mg via INTRAVENOUS
  Administered 2017-07-06 – 2017-07-10 (×24): 2 mg via INTRAVENOUS
  Filled 2017-07-04 (×32): qty 1
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
## 2017-07-04 NOTE — ED Notes (Signed)
Report given to [phone]. Tech Martin aware and will take patient upstairs.

## 2017-07-04 NOTE — ED Notes (Signed)
Attempted to call and give report. Secretary states the patient who I suppose to be receiving the report is in the middle of a rapid response and will call back in a couple minutes.

## 2017-07-04 NOTE — Progress Notes (Signed)
PROGRESS NOTE    Patrick [name]  YHC:[id number] DOB: Apr 05, 1951 DOA: 07/03/2017 PCP: [name], MD    Brief Narrative:  67 year old male who presented with cough, weakness and failure to thrive.  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  He complained of generalized pain, decreased p.o. intake, significant weight loss.  On the initial physical examination, patient was awake and alert, blood pressure 117/86, heart rate 95, respiratory rate 16-23, oxygen saturation 98%.  Dry mucous membranes, decreased breath sounds at the left base, heart S1-S2 present rhythmic, the abdomen was soft, mild diffuse tenderness, no guarding, no rebound, no lower extremity edema.  Sodium 135, potassium 4.7, chloride 96, bicarb 18, glucose 68, BUN 10, creatinine 0.75, magnesium 1.7, lipase 21, white count 5.8, hemoglobin 13.4, hematocrit 38.6, platelets 334.  Urinalysis negative for infection.  Head CT with no acute changes, mild atrophy with patchy periventricular small vessel disease.  X-ray, hyperinflation, left rotation, faint left basilar infiltrate.  EKG sinus tachycardia, significant base interference.  Patient was admitted to the hospital with a working diagnosis of left lower lobe pneumonia, complicated by malnutrition, related by esophageal stricture.  Assessment & Plan:   Principal Problem:   Failure to thrive in adult Active Problems:   TOBACCO USE   Alcohol induced Korsakoff syndrome (HCC)   Protein-calorie malnutrition, severe   Stricture and stenosis of esophagus   Left lower lobe pneumonia (HCC)   Prolonged QT interval  1.  Left lower lobe pneumonia. Will continue antibiotic therapy with IV zosyn, continue aspiration precautions and oxymetry monitoring. Will follow on chest film in am, possible pneumonitis and may de-escalate antibiotic therapy.   2.   Esophageal stricture. Patient is agreeing to have peg tube, will consult IR for evaluation, not candidate for repetitive dilatations per GI, will continue antiacid therapy with ppi and sucralfate. Continue hydration with LR and dextrose. Continue pain control with morphine.   3.  Calorie protein malnutrition. Nutrition consult for assessment, will benefit from tube feedings.   4.  History of tobacco and alcohol abuse. No signs of withdrawal, will continue neuro checks per unit protocol. Continue nicotine patch.   5. Hypokalemia and hypophosphatemia, hypomagnesemia. Will continue k and mg correction, will hold on phosphate until less than 1. Continue hydration with balance electrolyte solutions and dextrose, renal function is preserved.    DVT prophylaxis: heparin   Code Status: full Family Communication: no family at the bedside Disposition Plan: home when secure enteral por   Consultants:   Gi  Palliative care  Procedures:     Antimicrobials:   Zosyn    Subjective: Patient is very weak and deconditioned, has not been able to ambulate, positive dysphagia and vomiting. Agrees to have feeding tube.   Objective: Vitals:   07/04/17 0800 07/04/17 0930 07/04/17 1015 07/04/17 1100  BP: (!) 135/94 121/88 (!) 129/95 120/86  Pulse: (!) 116 (!) 109 (!) 112 (!) 107  Resp: (!) 22 10 12 11   Temp:      TempSrc:      SpO2: 99% 97% 100% 97%  Weight:      Height:        Intake/Output Summary (Last 24 hours) at 07/04/2017 1120 Last data filed at 07/04/2017 1101 Gross per 24 hour  Intake 1003 ml  Output -  Net 1003 ml   Filed Weights   07/03/17 1025  Weight: 45.4 kg (100  lb)    Examination:   General: Not in pain or dyspnea. deconditioned Neurology: Awake and alert, non focal  E ENT: positive pallor, no icterus, dry mucosa moist Cardiovascular: No JVD. S1-S2 present, rhythmic, no gallops, rubs, or murmurs. No lower extremity edema. Pulmonary: decreased breath sounds  bilaterally, adequate air movement, no wheezing, rhonchi. Positive rales at bases. Gastrointestinal. Abdomen flat, no organomegaly, non tender, no rebound or guarding Skin. No rashes Musculoskeletal: no joint deformities     Data Reviewed: I have personally reviewed following labs and imaging studies  CBC: Recent Labs  Lab 07/03/17 1203  WBC 5.8  NEUTROABS 3.9  HGB 13.4  HCT 38.6*  MCV 86.2  PLT 657   Basic Metabolic Panel: Recent Labs  Lab 07/03/17 1203 07/04/17 0537  NA 135 132*  K 4.7 3.4*  CL 96* 95*  CO2 18* 20*  GLUCOSE 68 167*  BUN 10 10  CREATININE 0.75 0.75  CALCIUM 9.5 8.9  MG 1.7 1.4*  PHOS  --  2.4*   GFR: Estimated Creatinine Clearance: 59.9 mL/min (by C-G formula based on SCr of 0.75 mg/dL). Liver Function Tests: Recent Labs  Lab 07/03/17 1203  AST 94*  ALT 32  ALKPHOS 114  BILITOT 1.8*  PROT 8.1  ALBUMIN 3.4*   Recent Labs  Lab 07/03/17 1203  LIPASE 21   Recent Labs  Lab 07/03/17 1203  AMMONIA 38*   Coagulation Profile: No results for input(s): INR, PROTIME in the last 168 hours. Cardiac Enzymes: Recent Labs  Lab 07/03/17 1203  TROPONINI <0.03   BNP (last 3 results) No results for input(s): PROBNP in the last 8760 hours. HbA1C: No results for input(s): HGBA1C in the last 72 hours. CBG: No results for input(s): GLUCAP in the last 168 hours. Lipid Profile: No results for input(s): CHOL, HDL, LDLCALC, TRIG, CHOLHDL, LDLDIRECT in the last 72 hours. Thyroid Function Tests: No results for input(s): TSH, T4TOTAL, FREET4, T3FREE, THYROIDAB in the last 72 hours. Anemia Panel: No results for input(s): VITAMINB12, FOLATE, FERRITIN, TIBC, IRON, RETICCTPCT in the last 72 hours.    Radiology Studies: I have reviewed all of the imaging during this hospital visit personally     Scheduled Meds: . heparin  5,000 Units Subcutaneous Q8H  . lactose free nutrition  237 mL Oral BID BM  . multivitamin  15 mL Oral Daily  . nicotine   21 mg Transdermal Daily  . sodium chloride flush  3 mL Intravenous Q12H  . sucralfate  1 g Oral Q6H  . thiamine injection  100 mg Intravenous Daily  . traZODone  50 mg Oral QHS   Continuous Infusions: . dextrose 5 % and 0.9% NaCl 100 mL/hr at 07/04/17 0237  . pantoprazole (PROTONIX) IV Stopped (07/04/17 1101)  . piperacillin-tazobactam (ZOSYN)  IV 3.375 g (07/04/17 0813)  . vancomycin       LOS: 1 day        Tawni Millers, MD [facility] Pager [phone]

## 2017-07-04 NOTE — ED Notes (Signed)
Paged the hospitilist about patients pain.

## 2017-07-04 NOTE — ED Notes (Signed)
ED TO INPATIENT HANDOFF REPORT  Name/Age/Gender Patrick [name] 67 y.o. male  Code Status    Code Status Orders  (From admission, onward)        Start     Ordered   07/03/17 2019  Full code  Continuous     07/03/17 2018    Code Status History    Date Active Date Inactive Code Status Order ID Comments User Context   06/05/2017 1120 06/09/2017 1925 Full Code [id number]  Charlynne Cousins, MD ED   04/20/2017 2311 04/27/2017 2133 DNR [id number]  Gwynne Edinger, MD Inpatient   04/11/2017 1510 04/17/2017 0207 DNR [id number]  Knox Royalty, NP Inpatient   04/10/2017 1114 04/11/2017 1510 Full Code [id number]  Sampson Goon, MD ED   04/09/2017 2103 04/10/2017 1114 Full Code [id number]  Phillips Grout, MD ED   01/04/2017 0746 01/08/2017 2146 Full Code [id number]  Reyne Dumas, MD ED   12/20/2016 1638 12/23/2016 2207 Full Code [id number]  Annita Brod, MD Inpatient   11/25/2016 0458 12/01/2016 1833 Full Code [id number]  Norval Morton, MD ED   11/25/2016 0140 11/25/2016 0458 Full Code [id number]  Beverely Pace ED    Advance Directive Documentation     Most Recent Value  Type of Advance Directive  Healthcare Power of Attorney  Pre-existing out of facility DNR order (yellow form or pink MOST form)  -  "MOST" Form in Place?  -      Home/SNF/Other Home  Chief Complaint emesis/weakness  Level of Care/Admitting Diagnosis ED Disposition    ED Disposition Condition [place]: [facility] [[id number]]  Level of Care: Telemetry [5]  Admit to tele based on following criteria: Monitor QTC interval  Diagnosis: Failure to thrive in adult [188416]  Admitting Physician: Patrecia Pour [phone]  Attending Physician: Patrecia Pour [phone]  Estimated length of stay: past midnight tomorrow  Certification:: I certify this patient will need inpatient services for at least 2 midnights  PT Class (Do Not Modify): Inpatient [101]  PT Acc Code (Do Not Modify): Private [1]        Medical History Past Medical History:  Diagnosis Date  . Colon polyps   . Depression   . Diverticulitis   . Diverticulosis of colon   . GERD (gastroesophageal reflux disease)   . Kidney stone     Allergies No Known Allergies  IV Location/Drains/Wounds Patient Lines/Drains/Airways Status   Active Line/Drains/Airways    Name:   Placement date:   Placement time:   Site:   Days:   Peripheral IV 07/03/17 Right;Upper Arm   07/03/17    1203    Arm   1   Pressure Injury 11/25/16 Stage I -  Intact skin with non-blanchable redness of a localized area usually over a bony prominence.   11/25/16    0630     221   Pressure Injury 04/10/17 Stage II -  Partial thickness loss of dermis presenting as a shallow open ulcer with a red, pink wound bed without slough. 3cm x 1cm area of blanchable redness with 2 small stage 2 ulcers in the mid sacral area   04/10/17    1330     85   Pressure Injury 04/10/17 Stage II -  Partial thickness loss of dermis presenting as a shallow open ulcer with a red, pink wound bed without slough. this is multiple areas of full thickness loss related to moisture associated  skin damage   04/10/17    1330     85          Labs/Imaging Results for orders placed or performed during the hospital encounter of 07/03/17 (from the past 48 hour(s))  CBC with Differential/Platelet     Status: Abnormal   Collection Time: 07/03/17 12:03 PM  Result Value Ref Range   WBC 5.8 4.0 - 10.5 K/uL   RBC 4.48 4.22 - 5.81 MIL/uL   Hemoglobin 13.4 13.0 - 17.0 g/dL   HCT 38.6 (L) 39.0 - 52.0 %   MCV 86.2 78.0 - 100.0 fL   MCH 29.9 26.0 - 34.0 pg   MCHC 34.7 30.0 - 36.0 g/dL   RDW 14.0 11.5 - 15.5 %   Platelets 334 150 - 400 K/uL   Neutrophils Relative % 68 %   Neutro Abs 3.9 1.7 - 7.7 K/uL   Lymphocytes Relative 25 %   Lymphs Abs 1.5 0.7 - 4.0 K/uL   Monocytes Relative 7 %   Monocytes Absolute 0.4 0.1 - 1.0 K/uL   Eosinophils Relative 0 %   Eosinophils Absolute 0.0 0.0 - 0.7 K/uL    Basophils Relative 0 %   Basophils Absolute 0.0 0.0 - 0.1 K/uL    Comment: Performed at [facility], [place] [address], [place], [place] [address]  Comprehensive metabolic panel     Status: Abnormal   Collection Time: 07/03/17 12:03 PM  Result Value Ref Range   Sodium 135 135 - 145 mmol/L   Potassium 4.7 3.5 - 5.1 mmol/L   Chloride 96 (L) 101 - 111 mmol/L   CO2 18 (L) 22 - 32 mmol/L   Glucose, Bld 68 65 - 99 mg/dL   BUN 10 6 - 20 mg/dL   Creatinine, Ser 0.75 0.61 - 1.24 mg/dL   Calcium 9.5 8.9 - 10.3 mg/dL   Total Protein 8.1 6.5 - 8.1 g/dL   Albumin 3.4 (L) 3.5 - 5.0 g/dL   AST 94 (H) 15 - 41 U/L   ALT 32 17 - 63 U/L   Alkaline Phosphatase 114 38 - 126 U/L   Total Bilirubin 1.8 (H) 0.3 - 1.2 mg/dL   GFR calc non Af Amer >60 >60 mL/min   GFR calc Af Amer >60 >60 mL/min    Comment: (NOTE) The eGFR has been calculated using the CKD EPI equation. This calculation has not been validated in all clinical situations. eGFR's persistently <60 mL/min signify possible Chronic Kidney Disease.    Anion gap 21 (H) 5 - 15    Comment: REPEATED TO VERIFY Performed at [facility], [place] [address] [place], [place] [address]   Lipase, blood     Status: None   Collection Time: 07/03/17 12:03 PM  Result Value Ref Range   Lipase 21 11 - 51 U/L    Comment: Performed at [facility], [place] [address] [place], [place] [address]  Troponin I     Status: None   Collection Time: 07/03/17 12:03 PM  Result Value Ref Range   Troponin I <0.03 <0.03 ng/mL    Comment: Performed at [facility] [place] [address], [place], [place] [address]  Ammonia     Status: Abnormal   Collection Time: 07/03/17 12:03 PM  Result Value Ref Range   Ammonia 38 (H) 9 - 35 umol/L    Comment: Performed at [facility], [place] [address] [place], [place] [address]  Magnesium  Status: None   Collection Time: 07/03/17 12:03 PM  Result  Value Ref Range   Magnesium 1.7 1.7 - 2.4 mg/dL    Comment: Performed at [facility], [place] [address] [place], [place] [address]  Urinalysis, Routine w reflex microscopic     Status: Abnormal   Collection Time: 07/03/17  8:30 PM  Result Value Ref Range   Color, Urine YELLOW YELLOW   APPearance CLEAR CLEAR   Specific Gravity, Urine 1.023 1.005 - 1.030   pH 6.0 5.0 - 8.0   Glucose, UA NEGATIVE NEGATIVE mg/dL   Hgb urine dipstick NEGATIVE NEGATIVE   Bilirubin Urine NEGATIVE NEGATIVE   Ketones, ur 80 (A) NEGATIVE mg/dL   Protein, ur NEGATIVE NEGATIVE mg/dL   Nitrite NEGATIVE NEGATIVE   Leukocytes, UA NEGATIVE NEGATIVE    Comment: Performed at [place] [address], [place], [place] [address]  Basic metabolic panel     Status: Abnormal   Collection Time: 07/04/17  5:37 AM  Result Value Ref Range   Sodium 132 (L) 135 - 145 mmol/L   Potassium 3.4 (L) 3.5 - 5.1 mmol/L    Comment: DELTA CHECK NOTED REPEATED TO VERIFY    Chloride 95 (L) 101 - 111 mmol/L   CO2 20 (L) 22 - 32 mmol/L   Glucose, Bld 167 (H) 65 - 99 mg/dL   BUN 10 6 - 20 mg/dL   Creatinine, Ser 0.75 0.61 - 1.24 mg/dL   Calcium 8.9 8.9 - 10.3 mg/dL   GFR calc non Af Amer >60 >60 mL/min   GFR calc Af Amer >60 >60 mL/min    Comment: (NOTE) The eGFR has been calculated using the CKD EPI equation. This calculation has not been validated in all clinical situations. eGFR's persistently <60 mL/min signify possible Chronic Kidney Disease.    Anion gap 17 (H) 5 - 15    Comment: Performed at [facility], [place] [address] [place], [place] [address]  Magnesium     Status: Abnormal   Collection Time: 07/04/17  5:37 AM  Result Value Ref Range   Magnesium 1.4 (L) 1.7 - 2.4 mg/dL    Comment: Performed at [facility], [place] [address], [place], [place] [address]  Phosphorus     Status: Abnormal   Collection Time: 07/04/17  5:37 AM  Result Value Ref  Range   Phosphorus 2.4 (L) 2.5 - 4.6 mg/dL    Comment: Performed at [facility], [place] [address], [place], [place] [address]   Dg Chest 2 View  Result Date: 07/03/2017 CLINICAL DATA:  Nausea and vomiting, worsening over the last week. EXAM: CHEST - 2 VIEW COMPARISON:  06/05/2017 FINDINGS: Heart size is normal. Right chest is clear. Hiatal hernia again noted. Worsened patchy density at the left base consistent with bronchopneumonia. Aortic atherosclerosis again noted. IMPRESSION: Worsened patchy density at the left base consistent with bronchopneumonia. Hiatal hernia as seen previously. Aortic atherosclerosis. Electronically Signed   By: Nelson Chimes M.D.   On: 07/03/2017 13:23   Ct Head Wo Contrast  Result Date: 07/03/2017 CLINICAL DATA:  Altered mental status.  Nausea and vomiting EXAM: CT HEAD WITHOUT CONTRAST TECHNIQUE: Contiguous axial images were obtained from the base of the skull through the vertex without intravenous contrast. COMPARISON:  Head CT June 05, 2017 and brain MRI June 05, 2017 FINDINGS: Brain: There is mild diffuse atrophy. There is no intracranial mass, hemorrhage, extra-axial fluid collection, or midline shift. There is  patchy small vessel disease in the centra semiovale bilaterally. Elsewhere gray-white compartments appear unremarkable. No acute infarct evident. Vascular: No hyperdense vessel. There is calcification in each distal carotid siphon region. Skull: The bony calvarium appears intact. Sinuses/Orbits: There is mild mucosal thickening in several ethmoid air cells. Other visualized paranasal sinuses are clear. Orbits appear symmetric bilaterally. Other: Mastoid air cells are clear. IMPRESSION: Mild atrophy with patchy periventricular small vessel disease. No acute infarct evident. No mass or hemorrhage. There are foci of arterial vascular calcification. There is mucosal thickening in several ethmoid air cells. Electronically Signed   By: Lowella Grip III M.D.   On: 07/03/2017 12:31    Pending Labs Unresulted Labs (From admission, onward)   Start     Ordered   07/05/17 9753  Basic metabolic panel  Tomorrow morning,   R     07/04/17 1340   07/05/17 0500  CBC with Differential/Platelet  Tomorrow morning,   R     07/04/17 1340   07/04/17 0500  Creatinine, serum  Daily,   R     07/03/17 1751   07/03/17 2019  Culture, sputum-assessment  Once,   R     07/03/17 2018   07/03/17 2019  MRSA PCR Screening  Once,   R     07/03/17 2018      Vitals/Pain Today's Vitals   07/04/17 1212 07/04/17 1230 07/04/17 1315 07/04/17 1400  BP:  (!) 131/92 (!) 138/93 (!) 130/93  Pulse:  (!) 108 (!) 111 (!) 106  Resp:  10 11 10   Temp:      TempSrc:      SpO2:  98% 100% 100%  Weight:      Height:      PainSc: 9        Isolation Precautions No active isolations  Medications Medications  dextrose 5 %-0.9 % sodium chloride infusion ( Intravenous Rate/Dose Change 07/04/17 1343)  acetaminophen (TYLENOL) tablet 650 mg (has no administration in time range)  thiamine (B-1) injection 100 mg (100 mg Intravenous Given 07/04/17 0952)  lactose free nutrition (BOOST PLUS) liquid 237 mL (237 mLs Oral Given 07/04/17 1344)  albuterol (PROVENTIL HFA;VENTOLIN HFA) 108 (90 Base) MCG/ACT inhaler 2 puff (has no administration in time range)  multivitamin liquid 15 mL (15 mLs Oral Given 07/04/17 0950)  nicotine (NICODERM CQ - dosed in mg/24 hours) patch 21 mg (21 mg Transdermal Patch Removed 07/04/17 1000)  pantoprazole (PROTONIX) 80 mg in sodium chloride 0.9 % 100 mL IVPB (0 mg Intravenous Stopped 07/04/17 1101)  traZODone (DESYREL) tablet 50 mg (50 mg Oral Given 07/03/17 2149)  heparin injection 5,000 Units (5,000 Units Subcutaneous Given 07/04/17 1344)  sodium chloride flush (NS) 0.9 % injection 3 mL (3 mLs Intravenous Given 07/04/17 1021)  sucralfate (CARAFATE) 1 GM/10ML suspension 1 g (1 g Oral Given 07/04/17 1217)  piperacillin-tazobactam (ZOSYN) IVPB 3.375 g  (3.375 g Intravenous New Bag/Given 07/04/17 1345)  morphine 2 MG/ML injection 1 mg (1 mg Intravenous Given 07/04/17 1111)  traMADol (ULTRAM) tablet 50 mg (has no administration in time range)  sodium chloride 0.9 % bolus 500 mL (500 mLs Intravenous Given 07/03/17 1205)  vancomycin (VANCOCIN) IVPB 1000 mg/200 mL premix (0 mg Intravenous Stopped 07/03/17 1511)  piperacillin-tazobactam (ZOSYN) IVPB 3.375 g (0 g Intravenous Stopped 07/03/17 1511)  sodium chloride 0.9 % bolus 500 mL (0 mLs Intravenous Stopped 07/03/17 1511)  morphine 2 MG/ML injection 2 mg (2 mg Intravenous Given 07/04/17 0012)  morphine 4 MG/ML injection  3 mg (3 mg Intravenous Given 07/04/17 0420)  prochlorperazine (COMPAZINE) injection 10 mg (10 mg Intravenous Given 07/04/17 0618)    Mobility walks with person assist

## 2017-07-04 NOTE — Progress Notes (Signed)
Spoke with Dr. Domingo Cocking of Palliative care who will see the patient in consult later this afternoon to help direct goals of care. This will be very helpful.   Thank you,  Ellouise Newer, PA-C  [facility]

## 2017-07-05 DIAGNOSIS — K208 Other esophagitis: Secondary | ICD-10-CM

## 2017-07-05 DIAGNOSIS — R627 Adult failure to thrive: Secondary | ICD-10-CM

## 2017-07-05 DIAGNOSIS — K222 Esophageal obstruction: Principal | ICD-10-CM

## 2017-07-05 LAB — BASIC METABOLIC PANEL
Anion gap: 7 (ref 5–15)
BUN: 5 mg/dL — ABNORMAL LOW (ref 6–20)
CHLORIDE: 102 mmol/L (ref 101–111)
CO2: 24 mmol/L (ref 22–32)
CREATININE: 0.68 mg/dL (ref 0.61–1.24)
Calcium: 8.5 mg/dL — ABNORMAL LOW (ref 8.9–10.3)
GFR calc Af Amer: >60 mL/min (ref >60)
GFR calc non Af Amer: >60 mL/min (ref >60)
Glucose, Bld: 115 mg/dL — ABNORMAL HIGH (ref 65–99)
Potassium: 4.6 mmol/L (ref 3.5–5.1)
Sodium: 133 mmol/L — ABNORMAL LOW (ref 135–145)

## 2017-07-05 LAB — CBC WITH DIFFERENTIAL/PLATELET
Basophils Absolute: 0 10*3/uL (ref 0.0–0.1)
Basophils Relative: 0 %
EOS ABS: 0.1 10*3/uL (ref 0.0–0.7)
EOS PCT: 1 %
HCT: 28.2 % — ABNORMAL LOW (ref 39.0–52.0)
HEMOGLOBIN: 9.6 g/dL — AB (ref 13.0–17.0)
LYMPHS ABS: 1.6 10*3/uL (ref 0.7–4.0)
Lymphocytes Relative: 29 %
MCH: 29.6 pg (ref 26.0–34.0)
MCHC: 34 g/dL (ref 30.0–36.0)
MCV: 87 fL (ref 78.0–100.0)
MONOS PCT: 10 %
Monocytes Absolute: 0.6 10*3/uL (ref 0.1–1.0)
Neutro Abs: 3.3 10*3/uL (ref 1.7–7.7)
Neutrophils Relative %: 60 %
PLATELETS: 197 10*3/uL (ref 150–400)
RBC: 3.24 MIL/uL — ABNORMAL LOW (ref 4.22–5.81)
RDW: 13.8 % (ref 11.5–15.5)
WBC: 5.6 10*3/uL (ref 4.0–10.5)

## 2017-07-05 LAB — PROTIME-INR
INR: 1.2
PROTHROMBIN TIME: 15.1 s (ref 11.4–15.2)

## 2017-07-05 MED ORDER — PANTOPRAZOLE SODIUM 40 MG IV SOLR
40.0000 mg | Freq: Two times a day (BID) | INTRAVENOUS | Status: DC
  Administered 2017-07-05 – 2017-07-06 (×2): 40 mg via INTRAVENOUS
  Filled 2017-07-05 (×2): qty 40

## 2017-07-05 MED ORDER — CEFAZOLIN SODIUM-DEXTROSE 2-4 GM/100ML-% IV SOLN
2.0000 g | INTRAVENOUS | Status: AC
  Administered 2017-07-06: 2 g via INTRAVENOUS
  Filled 2017-07-05: qty 100

## 2017-07-05 MED ORDER — ZOLPIDEM TARTRATE 5 MG PO TABS
5.0000 mg | ORAL_TABLET | Freq: Every evening | ORAL | Status: DC | PRN
  Administered 2017-07-05 (×2): 5 mg via ORAL
  Filled 2017-07-05 (×3): qty 1

## 2017-07-05 NOTE — Progress Notes (Addendum)
Following due to spiritual care consult re: advance directive.   Provided education around [facility] including default HCPOA in [place] if he does not name a HCPOA.  Patrick [name] spoke about naming either sister or ex wife as HCPOA.  Does not have phone number for sister.  Pt placed call to ex wife and left voice mail.  Will follow up in morning when Patrick [name] has contact information he would like to put on paperwork.    [facility] / [facility] Chaplain [name], MDiv, [facility]

## 2017-07-05 NOTE — Progress Notes (Signed)
OT Cancellation Note  Patient Details Name: Patrick [name] MRN: [id number] DOB: March 24, 1951   Cancelled Treatment:    Reason Eval/Treat Not Completed: Other (comment)  Pt with PT when OT checked on pt.  Noted suggestion for SNF. Will assess for OT need later In day or next day' Lori Kamika Goodloe, [place] Payton Mccallum D 07/05/2017, 11:30 AM
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
## 2017-07-05 NOTE — Progress Notes (Signed)
PROGRESS NOTE    Patrick [name]  KDT:[id number] DOB: 03-23-1951 DOA: 07/03/2017 PCP: [name], MD    Brief Narrative:  67 year old male who presented with cough, weakness and failure to thrive.  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  He complained of generalized pain, decreased p.o. intake, significant weight loss.  On the initial physical examination, patient was awake and alert, blood pressure 117/86, heart rate 95, respiratory rate 16-23, oxygen saturation 98%.  Dry mucous membranes, decreased breath sounds at the left base, heart S1-S2 present rhythmic, the abdomen was soft, mild diffuse tenderness, no guarding, no rebound, no lower extremity edema.  Sodium 135, potassium 4.7, chloride 96, bicarb 18, glucose 68, BUN 10, creatinine 0.75, magnesium 1.7, lipase 21, white count 5.8, hemoglobin 13.4, hematocrit 38.6, platelets 334.  Urinalysis negative for infection.  Head CT with no acute changes, mild atrophy with patchy periventricular small vessel disease.  X-ray, hyperinflation, left rotation, faint left basilar infiltrate.  EKG sinus tachycardia, significant base interference.  Patient was admitted to the hospital with a working diagnosis of left lower lobe pneumonia, complicated by malnutrition, related by esophageal stricture.  Assessment & Plan:   Principal Problem:   Failure to thrive in adult Active Problems:   TOBACCO USE   Alcohol induced Korsakoff syndrome (HCC)   Protein-calorie malnutrition, severe   Esophageal stricture   Left lower lobe pneumonia (HCC)   Prolonged QT interval   1.  Left lower lobe pneumonia ruled out. Follow chest film personally reviewed with no pneumonic infiltrate, likely left basal atelectasis. Patient with no fever, cough, dyspnea or leukocytosis. Will hold on antibiotic therapy. Continue close  clinical follow up. Aspiration precautions.  2.  Esophageal stricture. Continue with aggressive antiacid therapy with ppi bid IV and po sucralfate. IV hydration. Scheduled for peg tube placement per IR. Follow in the outpatient GI clinic. Continue pain control with morphine and tramadol.   3.  Severe calorie protein malnutrition. Will plan nutritional support with tube feedings. Patient will need SNF at discharge.   4.  History of tobacco and alcohol abuse. Nicotine patch. No signs of active withdrawal. Continue neuro checks per unit protocol.   5. Hypokalemia and hypophosphatemia, hypomagnesemia. K at 4,6 with Na at 133 and serum bicarbonate at 24. Preserved renal function with serum cr at 0.68. Continue close follow up of electrolytes. Continue hydration with IV fluids at 75 ml per hour.   DVT prophylaxis: enoxaparin  Code Status: full Family Communication: no family at the bedside Disposition Plan: home when secure enteral por   Consultants:   Gi  Palliative care  Procedures:     Antimicrobials:      Subjective: Patient still feeling very weak and deconditioned, no nausea or vomiting, no chest pain or dyspnea.   Objective: Vitals:   07/04/17 1445 07/04/17 1605 07/04/17 2055 07/05/17 0456  BP: (!) 141/99 (!) 151/98 (!) 150/99 (!) 131/94  Pulse: (!) 117 (!) 109 (!) 107 (!) 105  Resp: 15 16 18 16   Temp:  97.9 F (36.6 C) 97.8 F (36.6 C) (!) 97.5 F (36.4 C)  TempSrc:  Oral Oral Oral  SpO2: 100% 100% 100% 100%  Weight:  45 kg (99 lb 3.3 oz)    Height:  5\' 7"  (1.702 m)      Intake/Output Summary (Last 24 hours) at 07/05/2017 1113 Last data filed at 07/05/2017 0533 Gross per  24 hour  Intake 3384.58 ml  Output 400 ml  Net 2984.58 ml   Filed Weights   07/03/17 1025 07/04/17 1605  Weight: 45.4 kg (100 lb) 45 kg (99 lb 3.3 oz)    Examination:   General: Not in pain or dyspnea, deconditioned Neurology: Awake and alert, non focal  E ENT: mild  pallor, no icterus, oral mucosa moist Cardiovascular: No JVD. S1-S2 present, rhythmic, no gallops, rubs, or murmurs. No lower extremity edema. Pulmonary: vesicular breath sounds bilaterally, adequate air movement, no wheezing, rhonchi or rales. Gastrointestinal. Abdomen flat, no organomegaly, non tender, no rebound or guarding Skin. No rashes Musculoskeletal: no joint deformities     Data Reviewed: I have personally reviewed following labs and imaging studies  CBC: Recent Labs  Lab 07/03/17 1203 07/05/17 0521  WBC 5.8 5.6  NEUTROABS 3.9 3.3  HGB 13.4 9.6*  HCT 38.6* 28.2*  MCV 86.2 87.0  PLT 334 161   Basic Metabolic Panel: Recent Labs  Lab 07/03/17 1203 07/04/17 0537 07/05/17 0521  NA 135 132* 133*  K 4.7 3.4* 4.6  CL 96* 95* 102  CO2 18* 20* 24  GLUCOSE 68 167* 115*  BUN 10 10 5*  CREATININE 0.75 0.75 0.68  CALCIUM 9.5 8.9 8.5*  MG 1.7 1.4*  --   PHOS  --  2.4*  --    GFR: Estimated Creatinine Clearance: 59.4 mL/min (by C-G formula based on SCr of 0.68 mg/dL). Liver Function Tests: Recent Labs  Lab 07/03/17 1203  AST 94*  ALT 32  ALKPHOS 114  BILITOT 1.8*  PROT 8.1  ALBUMIN 3.4*   Recent Labs  Lab 07/03/17 1203  LIPASE 21   Recent Labs  Lab 07/03/17 1203  AMMONIA 38*   Coagulation Profile: Recent Labs  Lab 07/05/17 0937  INR 1.20   Cardiac Enzymes: Recent Labs  Lab 07/03/17 1203  TROPONINI <0.03   BNP (last 3 results) No results for input(s): PROBNP in the last 8760 hours. HbA1C: No results for input(s): HGBA1C in the last 72 hours. CBG: No results for input(s): GLUCAP in the last 168 hours. Lipid Profile: No results for input(s): CHOL, HDL, LDLCALC, TRIG, CHOLHDL, LDLDIRECT in the last 72 hours. Thyroid Function Tests: No results for input(s): TSH, T4TOTAL, FREET4, T3FREE, THYROIDAB in the last 72 hours. Anemia Panel: No results for input(s): VITAMINB12, FOLATE, FERRITIN, TIBC, IRON, RETICCTPCT in the last 72  hours.    Radiology Studies: I have reviewed all of the imaging during this hospital visit personally     Scheduled Meds: . heparin  5,000 Units Subcutaneous Q8H  . lactose free nutrition  237 mL Oral BID BM  . multivitamin  15 mL Oral Daily  . nicotine  21 mg Transdermal Daily  . sodium chloride flush  3 mL Intravenous Q12H  . sucralfate  1 g Oral Q6H  . thiamine injection  100 mg Intravenous Daily   Continuous Infusions: . dextrose 5 % and 0.9% NaCl 75 mL/hr at 07/05/17 0523  . pantoprazole (PROTONIX) IV 80 mg (07/05/17 1045)  . piperacillin-tazobactam (ZOSYN)  IV Stopped (07/05/17 0915)     LOS: 2 days        Leonila Speranza Gerome Apley, MD [facility] Pager [phone]

## 2017-07-05 NOTE — Progress Notes (Addendum)
Progress Note   Subjective   Chief Complaint: Failure to thrive in setting of esophageal stricture from severe ulcerative esophagitis  Pt now in agreeable to G tube placement. Continues to describe "all over pain". Denies any new complaints overnight.    Objective   Vital signs in last 24 hours: Temp:  [97.5 F (36.4 C)-97.9 F (36.6 C)] 97.5 F (36.4 C) (03/28 0456) Pulse Rate:  [105-117] 105 (03/28 0456) Resp:  [9-18] 16 (03/28 0456) BP: (120-151)/(86-99) 131/94 (03/28 0456) SpO2:  [97 %-100 %] 100 % (03/28 0456) Weight:  [99 lb 3.3 oz (45 kg)] 99 lb 3.3 oz (45 kg) (03/27 1605) Last BM Date: 07/04/17 General:   Cachectic, chronically ill appearing Caucasian male in NAD Heart:  Regular rate and rhythm; no murmurs Lungs: Respirations even and unlabored, lungs CTA bilaterally Abdomen:  Soft, nontender and nondistended. Normal bowel sounds. Extremities:  Without edema. Neurologic:  Alert and oriented,  grossly normal neurologically. Psych:  Cooperative. Normal mood and affect.  Intake/Output from previous day: 03/27 0701 - 03/28 0700 In: 3484.6 [I.V.:3134.6; IV Piggyback:350] Out: 400 [Urine:400]  Lab Results: Recent Labs    07/03/17 1203 07/05/17 0521  WBC 5.8 5.6  HGB 13.4 9.6*  HCT 38.6* 28.2*  PLT 334 197   BMET Recent Labs    07/03/17 1203 07/04/17 0537 07/05/17 0521  NA 135 132* 133*  K 4.7 3.4* 4.6  CL 96* 95* 102  CO2 18* 20* 24  GLUCOSE 68 167* 115*  BUN 10 10 5*  CREATININE 0.75 0.75 0.68  CALCIUM 9.5 8.9 8.5*   LFT Recent Labs    07/03/17 1203  PROT 8.1  ALBUMIN 3.4*  AST 94*  ALT 32  ALKPHOS 114  BILITOT 1.8*   Studies/Results: Ct Abdomen Wo Contrast  Result Date: 07/04/2017 CLINICAL DATA:  Gastrostomy tube evaluation EXAM: CT ABDOMEN WITHOUT CONTRAST TECHNIQUE: Multidetector CT imaging of the abdomen was performed following the standard protocol without IV contrast. COMPARISON:  01/04/2017 FINDINGS: Lower chest: Irregular  nodules have developed at the left lung base. The largest lobulated nodule measures 1.2 cm. Hepatobiliary: Hypodensity in the right lobe of the liver is stable. Gallstones. Pancreas: Unremarkable Spleen: Unremarkable Adrenals/Urinary Tract: Right renal cyst. Left kidney is unremarkable. Adrenal glands are within normal limits. Stomach/Bowel: The stomach is opposed to the anterior abdominal wall. No obvious mass in the colon. No evidence of small-bowel obstruction. Vascular/Lymphatic: Atherosclerotic calcifications of the aorta. No abnormal adenopathy. Other: There is a small amount of free fluid anterior to the liver. Musculoskeletal: No vertebral compression. IMPRESSION: New nodules at the left lung base. The largest measures 1.2 cm. Malignancy is not excluded. Consider PET-CT. Free-fluid in the abdomen. Stomach is opposed to the anterior abdominal wall. Electronically Signed   By: Marybelle Killings M.D.   On: 07/04/2017 15:51   Dg Chest 2 View  Result Date: 07/04/2017 CLINICAL DATA:  History of esophagitis and stricture EXAM: CHEST - 2 VIEW COMPARISON:  07/03/2017, esophagram 06/07/2017, CT chest 11/29/2016 FINDINGS: Hyperinflation with minimal scarring at the left base. No acute consolidation or pleural effusion. Stable cardiomediastinal silhouette with moderate hiatal hernia. No pneumothorax. IMPRESSION: 1. Hyperinflation with patchy atelectasis or scar at the left base 2. Moderate hiatal hernia Electronically Signed   By: Donavan Foil M.D.   On: 07/04/2017 16:04   Dg Chest 2 View  Result Date: 07/03/2017 CLINICAL DATA:  Nausea and vomiting, worsening over the last week. EXAM: CHEST - 2 VIEW COMPARISON:  06/05/2017 FINDINGS:  Heart size is normal. Right chest is clear. Hiatal hernia again noted. Worsened patchy density at the left base consistent with bronchopneumonia. Aortic atherosclerosis again noted. IMPRESSION: Worsened patchy density at the left base consistent with bronchopneumonia. Hiatal hernia as  seen previously. Aortic atherosclerosis. Electronically Signed   By: Nelson Chimes M.D.   On: 07/03/2017 13:23   Ct Head Wo Contrast  Result Date: 07/03/2017 CLINICAL DATA:  Altered mental status.  Nausea and vomiting EXAM: CT HEAD WITHOUT CONTRAST TECHNIQUE: Contiguous axial images were obtained from the base of the skull through the vertex without intravenous contrast. COMPARISON:  Head CT June 05, 2017 and brain MRI June 05, 2017 FINDINGS: Brain: There is mild diffuse atrophy. There is no intracranial mass, hemorrhage, extra-axial fluid collection, or midline shift. There is patchy small vessel disease in the centra semiovale bilaterally. Elsewhere gray-white compartments appear unremarkable. No acute infarct evident. Vascular: No hyperdense vessel. There is calcification in each distal carotid siphon region. Skull: The bony calvarium appears intact. Sinuses/Orbits: There is mild mucosal thickening in several ethmoid air cells. Other visualized paranasal sinuses are clear. Orbits appear symmetric bilaterally. Other: Mastoid air cells are clear. IMPRESSION: Mild atrophy with patchy periventricular small vessel disease. No acute infarct evident. No mass or hemorrhage. There are foci of arterial vascular calcification. There is mucosal thickening in several ethmoid air cells. Electronically Signed   By: Lowella Grip III M.D.   On: 07/03/2017 12:31       Assessment / Plan:    Assessment: 1.  Severe ulcerative esophagitis with stricture: EGD 06/06/17 with severe stenosis, 3.3 cm in length, patient now agreeable to G-tube placement, IR has been consulted 2.  Failure to thrive  Plan: 1.  See addendum by Dr. Ardis Hughs below.  Thank you for your kind consultation.   LOS: 2 days   Levin Erp  07/05/2017, 9:35 AM  Pager # [phone]   ________________________________________________________________________  Velora Heckler GI MD note:  I personally examined the patient, reviewed  the data and agree with the assessment and plan described above.  G tube with IR today.  Less than ideal social situation, will need social work, discharge planning to ensure that he is getting TFs delivered. Perhaps short term rehab or SNF placement would help get him on track?  I recommend dietician consult to help with TF plan.  He will need to be on PPI twice daily (via G tube). We will arrange for follow up appt at [facility] in 4-5 weeks to see how he is coping, complying with the plan.  If appropriate, will at that point plan for serial outpatient endoscopic dilations of the distal esophageal stricture.  Please call or page with any further questions or concerns.  Owens Loffler, MD [facility] Pager [phone]

## 2017-07-05 NOTE — Consult Note (Addendum)
Chief Complaint: Patient was seen in consultation today for percutaneous gastrostomy tube placement Chief Complaint  Patient presents with  . Nausea    Referring Physician(s): Arrien,M/Jacobs,D  Supervising Physician: Marybelle Killings  Patient Status: [facility] - In-pt  History of Present Illness: Patrick [name] is a 67 y.o. male with history of alcohol and tobacco abuse recently presented to the hospital with cough, weakness and failure to thrive.  History of reflux and severe esophagitis with recent hospitalization in February with EGD showing severe esophageal stenosis could not be passed by pediatric upper endoscope.  He has had generalized pain, decreased p.o. intake and significant weight loss.  CT abdomen performed yesterday revealed new nodules at left lung base with some free fluid in the abdomen and stomach opposed to the anterior abdominal wall.  He does have a moderate hiatal hernia.  Request now received from primary care, palliative care and GI services for gastrostomy tube placement.  Past Medical History:  Diagnosis Date  . Colon polyps   . Depression   . Diverticulitis   . Diverticulosis of colon   . GERD (gastroesophageal reflux disease)   . Kidney stone     Past Surgical History:  Procedure Laterality Date  . COLONOSCOPY    . ESOPHAGOGASTRODUODENOSCOPY N/A 06/06/2017   Procedure: ESOPHAGOGASTRODUODENOSCOPY (EGD);  Surgeon: Jerene Bears, MD;  Location: [facility];  Service: Gastroenterology;  Laterality: N/A;  . ESOPHAGOGASTRODUODENOSCOPY (EGD) WITH PROPOFOL N/A 12/21/2016   Procedure: ESOPHAGOGASTRODUODENOSCOPY (EGD) WITH PROPOFOL;  Surgeon: Doran Stabler, MD;  Location: [facility];  Service: Gastroenterology;  Laterality: N/A;  . UPPER GASTROINTESTINAL ENDOSCOPY      Allergies: Patient has no known allergies.  Medications: Prior to Admission medications   Medication Sig Start Date End Date Taking? Authorizing Provider  albuterol (VENTOLIN HFA)  108 (90 BASE) MCG/ACT inhaler Inhale 2 puffs into the lungs every 4 (four) hours as needed for wheezing or shortness of breath. 08/17/14  Yes Burchette, Alinda Sierras, MD  lactose free nutrition (BOOST PLUS) LIQD Take 237 mLs by mouth 2 (two) times daily between meals. 06/09/17 07/09/17 Yes Arrien, Jimmy Picket, MD  magnesium oxide (MAG-OX) 400 (241.3 Mg) MG tablet Take 1 tablet (400 mg total) by mouth daily. 04/28/17  Yes Theodis Blaze, MD  Multiple Vitamin (MULTIVITAMIN) LIQD Take 15 mLs by mouth daily. 06/10/17 07/10/17 Yes Arrien, Jimmy Picket, MD  nicotine (NICODERM CQ - DOSED IN MG/24 HOURS) 21 mg/24hr patch Place 1 patch (21 mg total) onto the skin daily. 12/02/16  Yes Short, Noah Delaine, MD  pantoprazole (PROTONIX) 40 MG tablet Take 1 tablet (40 mg total) by mouth 2 (two) times daily. 05/23/17  Yes Willia Craze, NP  sucralfate (CARAFATE) 1 GM/10ML suspension Take 10 mLs (1 g total) by mouth 4 (four) times daily -  with meals and at bedtime. 06/27/17  Yes Burchette, Alinda Sierras, MD  traZODone (DESYREL) 50 MG tablet Take 1 tablet (50 mg total) by mouth at bedtime. 05/14/17  Yes Burchette, Alinda Sierras, MD  thiamine 100 MG tablet Take 1 tablet (100 mg total) by mouth daily. Patient not taking: Reported on 06/27/2017 12/02/16   Janece Canterbury, MD     Family History  Problem Relation Age of Onset  . Heart disease Father 65       cabg    Social History   Socioeconomic History  . Marital status: Divorced    Spouse name: Not on file  . Number of children: Not on file  .  Years of education: Not on file  . Highest education level: Not on file  Occupational History  . Not on file  Social Needs  . Financial resource strain: Not on file  . Food insecurity:    Worry: Not on file    Inability: Not on file  . Transportation needs:    Medical: Not on file    Non-medical: Not on file  Tobacco Use  . Smoking status: Current Every Day Smoker    Packs/day: 1.00    Types: Cigarettes  . Smokeless tobacco: Never  Used  Substance and Sexual Activity  . Alcohol use: Yes    Alcohol/week: 8.4 oz    Types: 14 Glasses of wine per week  . Drug use: No  . Sexual activity: Never  Lifestyle  . Physical activity:    Days per week: Not on file    Minutes per session: Not on file  . Stress: Not on file  Relationships  . Social connections:    Talks on phone: Not on file    Gets together: Not on file    Attends religious service: Not on file    Active member of club or organization: Not on file    Attends meetings of clubs or organizations: Not on file    Relationship status: Not on file  Other Topics Concern  . Not on file  Social History Narrative  . Not on file      Review of Systems currently denies fever, abdominal pain, vomiting or abnormal bleeding.  He does complain of intermittent headaches, some dyspnea with exertion, occasional cough, back pain, intermittent nausea and some loose stools.  Vital Signs: BP (!) 131/94 (BP Location: Left Arm)   Pulse (!) 105   Temp (!) 97.5 F (36.4 C) (Oral)   Resp 16   Ht 5\' 7"  (1.702 m)   Wt 99 lb 3.3 oz (45 kg)   SpO2 100%   BMI 15.54 kg/m   Physical Exam cachectic appearing white male ;awake, alert.  Chest with distant breath sounds bilaterally, diminished left base.  Heart with tachycardic but regular rhythm.  Abdomen soft, positive bowel sounds, nontender.  No lower extremity edema  Imaging: Ct Abdomen Wo Contrast  Result Date: 07/04/2017 CLINICAL DATA:  Gastrostomy tube evaluation EXAM: CT ABDOMEN WITHOUT CONTRAST TECHNIQUE: Multidetector CT imaging of the abdomen was performed following the standard protocol without IV contrast. COMPARISON:  01/04/2017 FINDINGS: Lower chest: Irregular nodules have developed at the left lung base. The largest lobulated nodule measures 1.2 cm. Hepatobiliary: Hypodensity in the right lobe of the liver is stable. Gallstones. Pancreas: Unremarkable Spleen: Unremarkable Adrenals/Urinary Tract: Right renal cyst. Left  kidney is unremarkable. Adrenal glands are within normal limits. Stomach/Bowel: The stomach is opposed to the anterior abdominal wall. No obvious mass in the colon. No evidence of small-bowel obstruction. Vascular/Lymphatic: Atherosclerotic calcifications of the aorta. No abnormal adenopathy. Other: There is a small amount of free fluid anterior to the liver. Musculoskeletal: No vertebral compression. IMPRESSION: New nodules at the left lung base. The largest measures 1.2 cm. Malignancy is not excluded. Consider PET-CT. Free-fluid in the abdomen. Stomach is opposed to the anterior abdominal wall. Electronically Signed   By: Marybelle Killings M.D.   On: 07/04/2017 15:51   Dg Chest 2 View  Result Date: 07/04/2017 CLINICAL DATA:  History of esophagitis and stricture EXAM: CHEST - 2 VIEW COMPARISON:  07/03/2017, esophagram 06/07/2017, CT chest 11/29/2016 FINDINGS: Hyperinflation with minimal scarring at the left base. No acute  consolidation or pleural effusion. Stable cardiomediastinal silhouette with moderate hiatal hernia. No pneumothorax. IMPRESSION: 1. Hyperinflation with patchy atelectasis or scar at the left base 2. Moderate hiatal hernia Electronically Signed   By: Donavan Foil M.D.   On: 07/04/2017 16:04   Dg Chest 2 View  Result Date: 07/03/2017 CLINICAL DATA:  Nausea and vomiting, worsening over the last week. EXAM: CHEST - 2 VIEW COMPARISON:  06/05/2017 FINDINGS: Heart size is normal. Right chest is clear. Hiatal hernia again noted. Worsened patchy density at the left base consistent with bronchopneumonia. Aortic atherosclerosis again noted. IMPRESSION: Worsened patchy density at the left base consistent with bronchopneumonia. Hiatal hernia as seen previously. Aortic atherosclerosis. Electronically Signed   By: Nelson Chimes M.D.   On: 07/03/2017 13:23   Ct Head Wo Contrast  Result Date: 07/03/2017 CLINICAL DATA:  Altered mental status.  Nausea and vomiting EXAM: CT HEAD WITHOUT CONTRAST TECHNIQUE:  Contiguous axial images were obtained from the base of the skull through the vertex without intravenous contrast. COMPARISON:  Head CT June 05, 2017 and brain MRI June 05, 2017 FINDINGS: Brain: There is mild diffuse atrophy. There is no intracranial mass, hemorrhage, extra-axial fluid collection, or midline shift. There is patchy small vessel disease in the centra semiovale bilaterally. Elsewhere gray-white compartments appear unremarkable. No acute infarct evident. Vascular: No hyperdense vessel. There is calcification in each distal carotid siphon region. Skull: The bony calvarium appears intact. Sinuses/Orbits: There is mild mucosal thickening in several ethmoid air cells. Other visualized paranasal sinuses are clear. Orbits appear symmetric bilaterally. Other: Mastoid air cells are clear. IMPRESSION: Mild atrophy with patchy periventricular small vessel disease. No acute infarct evident. No mass or hemorrhage. There are foci of arterial vascular calcification. There is mucosal thickening in several ethmoid air cells. Electronically Signed   By: Lowella Grip III M.D.   On: 07/03/2017 12:31   Dg Esophagus  Result Date: 06/07/2017 CLINICAL DATA:  Patient with history of esophagitis and esophageal stricture. EXAM: ESOPHOGRAM/BARIUM SWALLOW TECHNIQUE: Single contrast examination was performed using thin barium and water soluble. FLUOROSCOPY TIME:  Fluoroscopy Time:  4.5 minutes Radiation Exposure Index (if provided by the fluoroscopic device): 32.3 mGy Number of Acquired Spot Images: 0 COMPARISON:  Chest radiograph 06/05/2017; CT chest 11/29/2016 FINDINGS: Exam was initiated with water-soluble contrast and completed with thin barium. The contrast passed through the esophagus into the stomach. There is a moderate hiatal hernia. Within the distal third of the esophagus there is a persistent stricture which measures approximately 3.3 cm in length (image 247; series 3). Additionally, there are findings  suggestive of esophageal wall thickening. IMPRESSION: 1. There is a persistent distal esophageal stricture proximal to the moderate hiatal hernia measuring approximately 3.3 cm in length. Considerations include malignant or infectious etiologies. 2. Moderate hiatal hernia. Electronically Signed   By: Lovey Newcomer M.D.   On: 06/07/2017 12:02    Labs:  CBC: Recent Labs    06/05/17 0217 06/06/17 0556 07/03/17 1203 07/05/17 0521  WBC 10.2 9.7 5.8 5.6  HGB 13.5 11.0* 13.4 9.6*  HCT 39.9 32.5* 38.6* 28.2*  PLT 445* 246 334 197    COAGS: Recent Labs    12/20/16 1251 01/04/17 0250 04/09/17 1620 07/05/17 0937  INR 1.19 1.09 1.09 1.20    BMP: Recent Labs    06/08/17 0532 07/03/17 1203 07/04/17 0537 07/05/17 0521  NA 133* 135 132* 133*  K 4.0 4.7 3.4* 4.6  CL 103 96* 95* 102  CO2 25 18* 20* 24  GLUCOSE 77 68 167* 115*  BUN 8 10 10  5*  CALCIUM 8.6* 9.5 8.9 8.5*  CREATININE 0.85 0.75 0.75 0.68  GFRNONAA >60 >60 >60 >60  GFRAA >60 >60 >60 >60    LIVER FUNCTION TESTS: Recent Labs    05/04/17 1146 06/04/17 1629 06/05/17 0217 07/03/17 1203  BILITOT 0.9 0.9 1.0 1.8*  AST 22 47* 55* 94*  ALT 12 20 14* 32  ALKPHOS 111 216* 176* 114  PROT 6.3 8.7* 6.7 8.1  ALBUMIN 2.4* 3.7 2.9* 3.4*    TUMOR MARKERS: No results for input(s): AFPTM, CEA, CA199, CHROMGRNA in the last 8760 hours.  Assessment and Plan: 67 y.o. male with history of alcohol and tobacco abuse recently presented to the hospital with cough, weakness and failure to thrive.  History of reflux and severe esophagitis with recent hospitalization in February with EGD showing severe esophageal stenosis could not be passed by pediatric upper endoscope.  He has had generalized pain, decreased p.o. intake and significant weight loss.  CT abdomen performed yesterday revealed new nodules at left lung base with some free fluid in the abdomen and stomach opposed to the anterior abdominal wall.  He does have a moderate hiatal  hernia.  Request now received from primary care, palliative care and GI services for gastrostomy tube placement.  Latest imaging studies were reviewed by Dr. Annamaria Boots.Risks and benefits discussed with the patient including, but not limited to the need for a barium enema during the procedure, bleeding, infection, peritonitis, or damage to adjacent structures.  All of the patient's questions were answered, patient is agreeable to proceed. Consent signed and in chart.  Procedure tentatively scheduled for 3/29.  Heparin injections will need to be held until after gastrostomy tube placement.   Thank you for this interesting consult.  I greatly enjoyed meeting Patrick [name] and look forward to participating in their care.  A copy of this report was sent to the requesting provider on this date.  Electronically Signed: D. Rowe Robert, PA-C 07/05/2017, 12:34 PM   I spent a total of 30 minutes  in face to face in clinical consultation, greater than 50% of which was counseling/coordinating care for percutaneous gastrostomy tube placement
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
## 2017-07-05 NOTE — Progress Notes (Signed)
Palliative care progress note  Reason for consult: Goals of care in light of esophageal stricture   I saw and examined Patrick [name] this AM.    He reports feeling a little better having made decision to proceed with PEG.  His main concern today is being discharged by Tuesday so he can pay his rent (states he fears eviction otherwise).    We reviewed plan for PEG and also plan for completion of paperwork to name his surrogate decision maker.  Reports pain better controlled today.  One dose of prn morphine overnight.  As goals seem clear, palliative will not follow daily moving forward.  Please call if there are specific needs with which we can be of assistance moving forward.  Total time: 20 minutes  Greater than 50%  of this time was spent counseling and coordinating care related to the above assessment and plan.  Micheline Rough, MD [place] [facility] [phone]

## 2017-07-06 ENCOUNTER — Encounter (HOSPITAL_COMMUNITY): Payer: Self-pay | Admitting: Interventional Radiology

## 2017-07-06 ENCOUNTER — Inpatient Hospital Stay (HOSPITAL_COMMUNITY): Payer: Self-pay

## 2017-07-06 HISTORY — PX: IR GASTROSTOMY TUBE MOD SED: IMG625

## 2017-07-06 LAB — CBC WITH DIFFERENTIAL/PLATELET
Basophils Absolute: 0 10*3/uL (ref 0.0–0.1)
Basophils Relative: 0 %
Eosinophils Absolute: 0.1 10*3/uL (ref 0.0–0.7)
Eosinophils Relative: 1 %
HCT: 27.5 % — ABNORMAL LOW (ref 39.0–52.0)
HEMOGLOBIN: 9.4 g/dL — AB (ref 13.0–17.0)
LYMPHS PCT: 32 %
Lymphs Abs: 1.6 10*3/uL (ref 0.7–4.0)
MCH: 29.6 pg (ref 26.0–34.0)
MCHC: 34.2 g/dL (ref 30.0–36.0)
MCV: 86.5 fL (ref 78.0–100.0)
MONO ABS: 0.5 10*3/uL (ref 0.1–1.0)
MONOS PCT: 10 %
NEUTROS PCT: 57 %
Neutro Abs: 2.7 10*3/uL (ref 1.7–7.7)
Platelets: 157 10*3/uL (ref 150–400)
RBC: 3.18 MIL/uL — ABNORMAL LOW (ref 4.22–5.81)
RDW: 13.7 % (ref 11.5–15.5)
WBC: 4.8 10*3/uL (ref 4.0–10.5)

## 2017-07-06 LAB — BASIC METABOLIC PANEL
Anion gap: 4 — ABNORMAL LOW (ref 5–15)
BUN: <5 mg/dL — ABNORMAL LOW (ref 6–20)
CHLORIDE: 104 mmol/L (ref 101–111)
CO2: 28 mmol/L (ref 22–32)
Calcium: 8.4 mg/dL — ABNORMAL LOW (ref 8.9–10.3)
Creatinine, Ser: 0.5 mg/dL — ABNORMAL LOW (ref 0.61–1.24)
GFR calc Af Amer: >60 mL/min (ref >60)
GFR calc non Af Amer: >60 mL/min (ref >60)
GLUCOSE: 93 mg/dL (ref 65–99)
POTASSIUM: 3.2 mmol/L — AB (ref 3.5–5.1)
Sodium: 136 mmol/L (ref 135–145)

## 2017-07-06 LAB — EXPECTORATED SPUTUM ASSESSMENT W GRAM STAIN, RFLX TO RESP C

## 2017-07-06 LAB — EXPECTORATED SPUTUM ASSESSMENT W REFEX TO RESP CULTURE

## 2017-07-06 LAB — MAGNESIUM: Magnesium: 1.3 mg/dL — ABNORMAL LOW (ref 1.7–2.4)

## 2017-07-06 LAB — MRSA PCR SCREENING: MRSA by PCR: NEGATIVE

## 2017-07-06 MED ORDER — GLUCAGON HCL RDNA (DIAGNOSTIC) 1 MG IJ SOLR
INTRAMUSCULAR | Status: AC
  Filled 2017-07-06: qty 1

## 2017-07-06 MED ORDER — PANTOPRAZOLE SODIUM 40 MG PO PACK
40.0000 mg | PACK | Freq: Two times a day (BID) | ORAL | Status: DC
  Administered 2017-07-07 – 2017-07-10 (×8): 40 mg
  Filled 2017-07-06 (×10): qty 20

## 2017-07-06 MED ORDER — POTASSIUM CHLORIDE 10 MEQ/100ML IV SOLN
10.0000 meq | INTRAVENOUS | Status: AC
  Administered 2017-07-06 (×4): 10 meq via INTRAVENOUS
  Filled 2017-07-06 (×2): qty 100

## 2017-07-06 MED ORDER — IOPAMIDOL (ISOVUE-300) INJECTION 61%
50.0000 mL | Freq: Once | INTRAVENOUS | Status: AC | PRN
  Administered 2017-07-06: 5 mL

## 2017-07-06 MED ORDER — MAGNESIUM SULFATE 2 GM/50ML IV SOLN
2.0000 g | Freq: Once | INTRAVENOUS | Status: AC
Start: 2017-07-06 — End: 2017-07-06
  Administered 2017-07-06: 2 g via INTRAVENOUS
  Filled 2017-07-06: qty 50

## 2017-07-06 MED ORDER — FENTANYL CITRATE (PF) 100 MCG/2ML IJ SOLN
INTRAMUSCULAR | Status: AC
  Filled 2017-07-06: qty 2

## 2017-07-06 MED ORDER — SUCRALFATE 1 GM/10ML PO SUSP
1.0000 g | Freq: Four times a day (QID) | ORAL | Status: DC
  Administered 2017-07-06 – 2017-07-10 (×15): 1 g
  Filled 2017-07-06 (×17): qty 10

## 2017-07-06 MED ORDER — ENOXAPARIN SODIUM 40 MG/0.4ML ~~LOC~~ SOLN
40.0000 mg | SUBCUTANEOUS | Status: DC
  Administered 2017-07-06 – 2017-07-09 (×4): 40 mg via SUBCUTANEOUS
  Filled 2017-07-06 (×4): qty 0.4

## 2017-07-06 MED ORDER — LIDOCAINE HCL (PF) 1 % IJ SOLN
INTRAMUSCULAR | Status: AC | PRN
  Administered 2017-07-06: 30 mL

## 2017-07-06 MED ORDER — FENTANYL CITRATE (PF) 100 MCG/2ML IJ SOLN
INTRAMUSCULAR | Status: AC | PRN
  Administered 2017-07-06: 50 ug via INTRAVENOUS

## 2017-07-06 MED ORDER — CEFAZOLIN SODIUM-DEXTROSE 2-4 GM/100ML-% IV SOLN
INTRAVENOUS | Status: AC
  Administered 2017-07-06: 2 g via INTRAVENOUS
  Filled 2017-07-06: qty 100

## 2017-07-06 MED ORDER — LIDOCAINE HCL (PF) 1 % IJ SOLN
INTRAMUSCULAR | Status: AC
  Filled 2017-07-06: qty 30

## 2017-07-06 MED ORDER — GLUCAGON HCL (RDNA) 1 MG IJ SOLR
INTRAMUSCULAR | Status: AC | PRN
  Administered 2017-07-06: .5 mg via INTRAVENOUS

## 2017-07-06 MED ORDER — MIDAZOLAM HCL 2 MG/2ML IJ SOLN
INTRAMUSCULAR | Status: AC | PRN
  Administered 2017-07-06: 1 mg via INTRAVENOUS

## 2017-07-06 MED ORDER — MIDAZOLAM HCL 2 MG/2ML IJ SOLN
INTRAMUSCULAR | Status: AC
  Filled 2017-07-06: qty 4

## 2017-07-06 MED ORDER — IOPAMIDOL (ISOVUE-300) INJECTION 61%
INTRAVENOUS | Status: AC
  Administered 2017-07-06: 5 mL
  Filled 2017-07-06: qty 50

## 2017-07-06 MED ORDER — OSMOLITE 1.5 CAL PO LIQD
1000.0000 mL | ORAL | Status: DC
  Administered 2017-07-06 – 2017-07-08 (×3): 1000 mL
  Filled 2017-07-06 (×3): qty 1000

## 2017-07-06 NOTE — Procedures (Signed)
Gastrostomy 18 Fr balloon EBL 0 Comp 0

## 2017-07-06 NOTE — Plan of Care (Signed)
Sputum sample sent to [facility], but lab states unusable.  New order placed and will re-attempt if possible. Patient cough is virtually unproductive.
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
## 2017-07-07 LAB — BASIC METABOLIC PANEL
Anion gap: 5 (ref 5–15)
BUN: <5 mg/dL — ABNORMAL LOW (ref 6–20)
CALCIUM: 8.1 mg/dL — AB (ref 8.9–10.3)
CO2: 26 mmol/L (ref 22–32)
CREATININE: 0.48 mg/dL — AB (ref 0.61–1.24)
Chloride: 102 mmol/L (ref 101–111)
GFR calc Af Amer: >60 mL/min (ref >60)
GLUCOSE: 110 mg/dL — AB (ref 65–99)
POTASSIUM: 3.8 mmol/L (ref 3.5–5.1)
SODIUM: 133 mmol/L — AB (ref 135–145)

## 2017-07-07 LAB — MAGNESIUM: Magnesium: 1.4 mg/dL — ABNORMAL LOW (ref 1.7–2.4)

## 2017-07-07 LAB — PHOSPHORUS
Phosphorus: <1 mg/dL — CL (ref 2.5–4.6)
Phosphorus: 1.8 mg/dL — ABNORMAL LOW (ref 2.5–4.6)

## 2017-07-07 MED ORDER — ZOLPIDEM TARTRATE 5 MG PO TABS
5.0000 mg | ORAL_TABLET | Freq: Every evening | ORAL | Status: DC | PRN
  Administered 2017-07-07 – 2017-07-08 (×3): 5 mg
  Filled 2017-07-07 (×3): qty 1

## 2017-07-07 MED ORDER — SODIUM GLYCEROPHOSPHATE 1 MMOLE/ML IV SOLN
20.0000 mmol | Freq: Once | INTRAVENOUS | Status: AC
  Administered 2017-07-07: 20 mmol via INTRAVENOUS
  Filled 2017-07-07: qty 20

## 2017-07-07 MED ORDER — POTASSIUM & SODIUM PHOSPHATES 280-160-250 MG PO PACK
1.0000 | PACK | Freq: Three times a day (TID) | ORAL | Status: DC
  Filled 2017-07-07: qty 1

## 2017-07-07 MED ORDER — MAGNESIUM SULFATE 2 GM/50ML IV SOLN
2.0000 g | Freq: Once | INTRAVENOUS | Status: AC
  Administered 2017-07-07: 2 g via INTRAVENOUS
  Filled 2017-07-07: qty 50

## 2017-07-07 MED ORDER — TRAMADOL HCL 50 MG PO TABS: 50.0000 mg | ORAL_TABLET | Freq: Four times a day (QID) | ORAL | Status: DC | PRN

## 2017-07-07 NOTE — Progress Notes (Signed)
CRITICAL VALUE ALERT  Critical Value:  Phosphorus <1  Date & Time Notied:  07/07/2017 9539  Provider Notified: On call provider - Bodenheimer  Orders Received/Actions taken: orders received, on-coming RN aware.
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
## 2017-07-07 NOTE — Progress Notes (Signed)
Pt requested to speak with a chaplain tomorrow 03/31 around 1300, regarding advance directives. On-call chaplain made aware and plans to be here at that time.

## 2017-07-08 LAB — BASIC METABOLIC PANEL
Anion gap: 5 (ref 5–15)
BUN: 6 mg/dL (ref 6–20)
CHLORIDE: 98 mmol/L — AB (ref 101–111)
CO2: 29 mmol/L (ref 22–32)
CREATININE: 0.51 mg/dL — AB (ref 0.61–1.24)
Calcium: 8.5 mg/dL — ABNORMAL LOW (ref 8.9–10.3)
GFR calc Af Amer: >60 mL/min (ref >60)
GFR calc non Af Amer: >60 mL/min (ref >60)
Glucose, Bld: 102 mg/dL — ABNORMAL HIGH (ref 65–99)
POTASSIUM: 4.1 mmol/L (ref 3.5–5.1)
Sodium: 132 mmol/L — ABNORMAL LOW (ref 135–145)

## 2017-07-08 LAB — MAGNESIUM: MAGNESIUM: 1.6 mg/dL — AB (ref 1.7–2.4)

## 2017-07-08 LAB — PHOSPHORUS: Phosphorus: 1.8 mg/dL — ABNORMAL LOW (ref 2.5–4.6)

## 2017-07-08 MED ORDER — SODIUM GLYCEROPHOSPHATE 1 MMOLE/ML IV SOLN
10.0000 mmol | Freq: Once | INTRAVENOUS | Status: AC
  Administered 2017-07-08: 10 mmol via INTRAVENOUS
  Filled 2017-07-08: qty 10

## 2017-07-08 MED ORDER — MAGNESIUM SULFATE 2 GM/50ML IV SOLN
2.0000 g | Freq: Once | INTRAVENOUS | Status: AC
  Administered 2017-07-08: 2 g via INTRAVENOUS
  Filled 2017-07-08: qty 50

## 2017-07-08 MED ORDER — OSMOLITE 1.5 CAL PO LIQD
1000.0000 mL | ORAL | Status: DC
  Administered 2017-07-09 – 2017-07-10 (×2): 1000 mL
  Filled 2017-07-08 (×4): qty 1000
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
## 2017-07-08 NOTE — Progress Notes (Signed)
Referring Physician(s): Arrien,M/Jacobs,D  Supervising Physician: Daryll Brod  Patient Status:  [facility] - In-pt  Chief Complaint:  S/P gastrostomy tube placement 3/29 by Dr. Barbie Banner  Subjective:  Doing ok. No complaints.  Allergies: Patient has no known allergies.  Medications: Prior to Admission medications   Medication Sig Start Date End Date Taking? Authorizing Provider  albuterol (VENTOLIN HFA) 108 (90 BASE) MCG/ACT inhaler Inhale 2 puffs into the lungs every 4 (four) hours as needed for wheezing or shortness of breath. 08/17/14  Yes Burchette, Alinda Sierras, MD  lactose free nutrition (BOOST PLUS) LIQD Take 237 mLs by mouth 2 (two) times daily between meals. 06/09/17 07/09/17 Yes Arrien, Jimmy Picket, MD  magnesium oxide (MAG-OX) 400 (241.3 Mg) MG tablet Take 1 tablet (400 mg total) by mouth daily. 04/28/17  Yes Theodis Blaze, MD  Multiple Vitamin (MULTIVITAMIN) LIQD Take 15 mLs by mouth daily. 06/10/17 07/10/17 Yes Arrien, Jimmy Picket, MD  nicotine (NICODERM CQ - DOSED IN MG/24 HOURS) 21 mg/24hr patch Place 1 patch (21 mg total) onto the skin daily. 12/02/16  Yes Short, Noah Delaine, MD  pantoprazole (PROTONIX) 40 MG tablet Take 1 tablet (40 mg total) by mouth 2 (two) times daily. 05/23/17  Yes Willia Craze, NP  sucralfate (CARAFATE) 1 GM/10ML suspension Take 10 mLs (1 g total) by mouth 4 (four) times daily -  with meals and at bedtime. 06/27/17  Yes Burchette, Alinda Sierras, MD  traZODone (DESYREL) 50 MG tablet Take 1 tablet (50 mg total) by mouth at bedtime. 05/14/17  Yes Burchette, Alinda Sierras, MD  thiamine 100 MG tablet Take 1 tablet (100 mg total) by mouth daily. Patient not taking: Reported on 06/27/2017 12/02/16   Janece Canterbury, MD     Vital Signs: BP 111/83 (BP Location: Right Arm)   Pulse (!) 102   Temp 98.8 F (37.1 C) (Oral)   Resp 16   Ht 5\' 7"  (1.702 m)   Wt 99 lb 6.8 oz (45.1 kg)   SpO2 100%   BMI 15.57 kg/m   Physical Exam Awake and alert NAD G-tube in good position  anterior abdomen Abdomen soft, NT No bleeding  Imaging: Ct Abdomen Wo Contrast  Result Date: 07/04/2017 CLINICAL DATA:  Gastrostomy tube evaluation EXAM: CT ABDOMEN WITHOUT CONTRAST TECHNIQUE: Multidetector CT imaging of the abdomen was performed following the standard protocol without IV contrast. COMPARISON:  01/04/2017 FINDINGS: Lower chest: Irregular nodules have developed at the left lung base. The largest lobulated nodule measures 1.2 cm. Hepatobiliary: Hypodensity in the right lobe of the liver is stable. Gallstones. Pancreas: Unremarkable Spleen: Unremarkable Adrenals/Urinary Tract: Right renal cyst. Left kidney is unremarkable. Adrenal glands are within normal limits. Stomach/Bowel: The stomach is opposed to the anterior abdominal wall. No obvious mass in the colon. No evidence of small-bowel obstruction. Vascular/Lymphatic: Atherosclerotic calcifications of the aorta. No abnormal adenopathy. Other: There is a small amount of free fluid anterior to the liver. Musculoskeletal: No vertebral compression. IMPRESSION: New nodules at the left lung base. The largest measures 1.2 cm. Malignancy is not excluded. Consider PET-CT. Free-fluid in the abdomen. Stomach is opposed to the anterior abdominal wall. Electronically Signed   By: Marybelle Killings M.D.   On: 07/04/2017 15:51   Dg Chest 2 View  Result Date: 07/04/2017 CLINICAL DATA:  History of esophagitis and stricture EXAM: CHEST - 2 VIEW COMPARISON:  07/03/2017, esophagram 06/07/2017, CT chest 11/29/2016 FINDINGS: Hyperinflation with minimal scarring at the left base. No acute consolidation or pleural effusion. Stable cardiomediastinal  silhouette with moderate hiatal hernia. No pneumothorax. IMPRESSION: 1. Hyperinflation with patchy atelectasis or scar at the left base 2. Moderate hiatal hernia Electronically Signed   By: Donavan Foil M.D.   On: 07/04/2017 16:04   Ir Gastrostomy Tube Mod Sed  Result Date: 07/06/2017 INDICATION: Severe esophageal  stenosis EXAM: PERC PLACEMENT GASTROSTOMY MEDICATIONS: Ancef 2 g; Antibiotics were administered within 1 hour of the procedure. Glucagon 1 mg IV ANESTHESIA/SEDATION: Versed 1 mg IV; Fentanyl 50 mcg IV Moderate Sedation Time:  24 minutes The patient was continuously monitored during the procedure by the interventional radiology nurse under my direct supervision. CONTRAST:  52mL ISOVUE-300 IOPAMIDOL (ISOVUE-300) INJECTION 61%, 48mL ISOVUE-300 IOPAMIDOL (ISOVUE-300) INJECTION 61% - administered into the gastric lumen. FLUOROSCOPY TIME:  Fluoroscopy Time: 2 minutes 18 seconds (27 mGy). COMPLICATIONS: None immediate. PROCEDURE: The procedure, risks, benefits, and alternatives were explained to the patient. Questions regarding the procedure were encouraged and answered. The patient understands and consents to the procedure. The epigastrium was prepped with Betadine in a sterile fashion, and a sterile drape was applied covering the operative field. A sterile gown and sterile gloves were used for the procedure. Glucagon was administered intravenously. A 5 French catheter was advanced from the oral cavity to the stomach. The stomach was distended with gas. An 18 gauge needle was advanced into the lumen of the stomach via the body. A T tack was deployed. A total of 3 T tacks were deployed in a triangular configuration. A needle was utilized to puncture the stomach between the T tacks. It was removed over an Amplatz wire. The tract was dilated to 20 [place]. A 20 French peel-away was inserted. The 14 French gastrostomy tube was inserted and the peel-away sheath was removed. The balloon was insufflated with 10 cc saline. Contrast was injected. FINDINGS: Images demonstrate placement of an 50 French balloon retention gastrostomy tube. IMPRESSION: Successful 70 French balloon retention gastrostomy tube placement. Electronically Signed   By: Marybelle Killings M.D.   On: 07/06/2017 15:03    Labs:  CBC: Recent Labs    06/06/17 0556  07/03/17 1203 07/05/17 0521 07/06/17 0512  WBC 9.7 5.8 5.6 4.8  HGB 11.0* 13.4 9.6* 9.4*  HCT 32.5* 38.6* 28.2* 27.5*  PLT 246 334 197 157    COAGS: Recent Labs    12/20/16 1251 01/04/17 0250 04/09/17 1620 07/05/17 0937  INR 1.19 1.09 1.09 1.20    BMP: Recent Labs    07/05/17 0521 07/06/17 0512 07/07/17 0458 07/08/17 0445  NA 133* 136 133* 132*  K 4.6 3.2* 3.8 4.1  CL 102 104 102 98*  CO2 24 28 26 29   GLUCOSE 115* 93 110* 102*  BUN 5* <5* <5* 6  CALCIUM 8.5* 8.4* 8.1* 8.5*  CREATININE 0.68 0.50* 0.48* 0.51*  GFRNONAA >60 >60 >60 >60  GFRAA >60 >60 >60 >60    LIVER FUNCTION TESTS: Recent Labs    05/04/17 1146 06/04/17 1629 06/05/17 0217 07/03/17 1203  BILITOT 0.9 0.9 1.0 1.8*  AST 22 47* 55* 94*  ALT 12 20 14* 32  ALKPHOS 111 216* 176* 114  PROT 6.3 8.7* 6.7 8.1  ALBUMIN 2.4* 3.7 2.9* 3.4*    Assessment and Plan:  Severe esophogeal stenosis  S/P gastrostomy tube placement by Dr. Barbie Banner on 07/06/17.  Ok to start tube feeds.  Routine G-tube care.  Electronically Signed: Murrell Redden, PA-C 07/08/2017, 10:17 AM   I spent a total of 15 Minutes at the the patient's bedside AND on  the patient's hospital floor or unit, greater than 50% of which was counseling/coordinating care for f/u after G-tube.

## 2017-07-08 NOTE — Progress Notes (Signed)
Name: Patrick [name]  Location: [address] of call: AD  Summary of visit: Chaplain paged to help Pt fill-out A.D. paper work.  He's hoping to get it notarized tomorrow morning.
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
## 2017-07-09 LAB — BASIC METABOLIC PANEL
Anion gap: 6 (ref 5–15)
BUN: 8 mg/dL (ref 6–20)
CHLORIDE: 95 mmol/L — AB (ref 101–111)
CO2: 26 mmol/L (ref 22–32)
CREATININE: 0.46 mg/dL — AB (ref 0.61–1.24)
Calcium: 8.4 mg/dL — ABNORMAL LOW (ref 8.9–10.3)
GFR calc non Af Amer: >60 mL/min (ref >60)
GLUCOSE: 111 mg/dL — AB (ref 65–99)
Potassium: 3.8 mmol/L (ref 3.5–5.1)
Sodium: 127 mmol/L — ABNORMAL LOW (ref 135–145)

## 2017-07-09 LAB — PHOSPHORUS: PHOSPHORUS: 1.9 mg/dL — AB (ref 2.5–4.6)

## 2017-07-09 LAB — MAGNESIUM: Magnesium: 1.5 mg/dL — ABNORMAL LOW (ref 1.7–2.4)

## 2017-07-09 MED ORDER — MAGNESIUM SULFATE 2 GM/50ML IV SOLN
2.0000 g | Freq: Once | INTRAVENOUS | Status: AC
  Administered 2017-07-09: 2 g via INTRAVENOUS
  Filled 2017-07-09: qty 50

## 2017-07-09 MED ORDER — POLYETHYLENE GLYCOL 3350 17 G PO PACK
17.0000 g | PACK | Freq: Every day | ORAL | Status: DC
  Administered 2017-07-09 – 2017-07-10 (×2): 17 g via ORAL
  Filled 2017-07-09 (×2): qty 1

## 2017-07-09 MED ORDER — SODIUM CHLORIDE 0.9 % IV SOLN
INTRAVENOUS | Status: DC
  Administered 2017-07-09 – 2017-07-10 (×2): via INTRAVENOUS

## 2017-07-09 MED ORDER — SODIUM GLYCEROPHOSPHATE 1 MMOLE/ML IV SOLN
20.0000 mmol | Freq: Once | INTRAVENOUS | Status: AC
  Administered 2017-07-09: 20 mmol via INTRAVENOUS
  Filled 2017-07-09: qty 20

## 2017-07-09 MED ORDER — SENNA 8.6 MG PO TABS
1.0000 | ORAL_TABLET | Freq: Every day | ORAL | Status: DC
Start: 2017-07-09 — End: 2017-07-10
  Administered 2017-07-09 – 2017-07-10 (×2): 8.6 mg via ORAL
  Filled 2017-07-09 (×2): qty 1

## 2017-07-09 NOTE — Progress Notes (Signed)
PROGRESS NOTE    Patrick [name]  XKG:[id number] DOB: May 23, 1950 DOA: 07/03/2017 PCP: [name], MD     Brief Narrative:  Patrick [name] is 67 year old male who presented with cough, weakness and failure to thrive. He does have asignificant past medical history of esophagitis with severe esophageal stricture, protein calorie malnutrition, tobacco and alcohol abusewithKorsakoff syndrome. He complained of generalized pain, decreased p.o. intake, significant weight loss.He has not been able to take medications or food by mouth. He takes a few ounces of milk/ice cream daily but has continued to lose weight steadily. His BMI is now 15. CXR demonstrated worsening LLL patchy density concerning for pneumonia and was started on antibiotics empirically. GI was consulted who recommended PEG tube placement placed by IR for enteral feeding.  Initially, patient was hesitant to agree for PEG tube.  Palliative care was consulted.  Patient eventually agreed for PEG tube, IR was consulted and PEG placed on 3/29.   Assessment & Plan:   Principal Problem:   Failure to thrive in adult Active Problems:   TOBACCO USE   Alcohol induced Korsakoff syndrome (HCC)   Protein-calorie malnutrition, severe   Esophageal stricture   Left lower lobe pneumonia (HCC)   Prolonged QT interval   Failure to thrive secondary to his severe ulcerative esophagitis with stricture and inability to tolerate PO  -GI consulted.  EGD on 06/06/2017 with severe stenosis.  They recommended PEG tube for nutrition and eventual serial endoscopic dilatation as an outpatient -Continue PPI twice daily -Follow-up at [facility] in 4-5 weeks to determine if he will undergo serial outpatient endoscopic dilatation of the distal esophageal stricture -PEG tube placed 3/29, continue tube feeding and continue to monitor electrolytes and watch for risk of refeeding syndrome  Hypomagnesemia -Replace, trend  Hypophosphatemia -Replace,  trend   Hyponatremia -IVF started today   Left lower lobe pneumonia ruled out -Chest x-ray was most likely secondary to left basal atelectasis -Patient has no clinical signs of infectious etiology.  Antibiotics stopped -No further productive sputum to collect   Pulmonary nodule measures 1.2 cm -Consider outpatient PET-CT   Severe calorie protein malnutrition -PEG tube for enteral feeding  Tobacco abuse -Nicotine patch    DVT prophylaxis: Lovenox  Code Status: DNR  Family Communication: No family at bedside Disposition Plan: Has been declining SNF, wants to go home. Replace electrolytes today. If stable, discharge 4/2    Consultants:   GI  Palliative care  Procedures:   PEG by IR 3/29  Antimicrobials:  Anti-infectives (From admission, onward)   Start     Dose/Rate Route Frequency Ordered Stop   07/06/17 1500  ceFAZolin (ANCEF) IVPB 2g/100 mL premix     2 g 200 mL/hr over 30 Minutes Intravenous To Radiology 07/05/17 1255 07/06/17 1130   07/04/17 1500  vancomycin (VANCOCIN) IVPB 750 mg/150 ml premix  Status:  Discontinued     750 mg 150 mL/hr over 60 Minutes Intravenous Every 24 hours 07/03/17 1751 07/04/17 1340   07/03/17 2200  piperacillin-tazobactam (ZOSYN) IVPB 3.375 g  Status:  Discontinued     3.375 g 12.5 mL/hr over 240 Minutes Intravenous Every 8 hours 07/03/17 1751 07/05/17 1134   07/03/17 1400  vancomycin (VANCOCIN) IVPB 1000 mg/200 mL premix     1,000 mg 200 mL/hr over 60 Minutes Intravenous  Once 07/03/17 1353 07/03/17 1511   07/03/17 1400  piperacillin-tazobactam (ZOSYN) IVPB 3.375 g     3.375 g 100 mL/hr over 30 Minutes Intravenous  Once  07/03/17 1353 07/03/17 1511       Subjective: Constipated. No new issues. Asks for ambulance ride home   Objective: Vitals:   07/08/17 0509 07/08/17 1324 07/08/17 2058 07/09/17 0530  BP: 111/83 116/79 118/89 110/75  Pulse: (!) 102 97 (!) 109 99  Resp: 16 20 20 20   Temp: 98.8 F (37.1 C) 98.7 F (37.1 C)  98.6 F (37 C) 98.9 F (37.2 C)  TempSrc: Oral Oral Oral Oral  SpO2: 100% 100% 100% 100%  Weight:      Height:        Intake/Output Summary (Last 24 hours) at 07/09/2017 1036 Last data filed at 07/09/2017 0530 Gross per 24 hour  Intake 1384.33 ml  Output 1175 ml  Net 209.33 ml   Filed Weights   07/03/17 1025 07/04/17 1605 07/06/17 0221  Weight: 45.4 kg (100 lb) 45 kg (99 lb 3.3 oz) 45.1 kg (99 lb 6.8 oz)   Examination: General exam: Appears calm and comfortable, thin and cachectic  Respiratory system: Clear to auscultation. Respiratory effort normal. Cardiovascular system: S1 & S2 heard, tachycardic, regular rhythm. No JVD, murmurs, rubs, gallops or clicks. No pedal edema. Gastrointestinal system: Abdomen is nondistended, soft and nontender. No organomegaly or masses felt. Normal bowel sounds heard. +PEG  Central nervous system: Alert and oriented. No focal neurological deficits. Extremities: Symmetric 5 x 5 power. Skin: No rashes, lesions or ulcers Psychiatry: Judgement and insight appear normal. Mood & affect appropriate.    Data Reviewed: I have personally reviewed following labs and imaging studies  CBC: Recent Labs  Lab 07/03/17 1203 07/05/17 0521 07/06/17 0512  WBC 5.8 5.6 4.8  NEUTROABS 3.9 3.3 2.7  HGB 13.4 9.6* 9.4*  HCT 38.6* 28.2* 27.5*  MCV 86.2 87.0 86.5  PLT 334 197 749   Basic Metabolic Panel: Recent Labs  Lab 07/04/17 0537 07/05/17 0521 07/06/17 0512 07/07/17 0458 07/07/17 2115 07/08/17 0445 07/09/17 0514  NA 132* 133* 136 133*  --  132* 127*  K 3.4* 4.6 3.2* 3.8  --  4.1 3.8  CL 95* 102 104 102  --  98* 95*  CO2 20* 24 28 26   --  29 26  GLUCOSE 167* 115* 93 110*  --  102* 111*  BUN 10 5* <5* <5*  --  6 8  CREATININE 0.75 0.68 0.50* 0.48*  --  0.51* 0.46*  CALCIUM 8.9 8.5* 8.4* 8.1*  --  8.5* 8.4*  MG 1.4*  --  1.3* 1.4*  --  1.6* 1.5*  PHOS 2.4*  --   --  <1.0* 1.8* 1.8* 1.9*   GFR: Estimated Creatinine Clearance: 59.5 mL/min (A) (by  C-G formula based on SCr of 0.46 mg/dL (L)). Liver Function Tests: Recent Labs  Lab 07/03/17 1203  AST 94*  ALT 32  ALKPHOS 114  BILITOT 1.8*  PROT 8.1  ALBUMIN 3.4*   Recent Labs  Lab 07/03/17 1203  LIPASE 21   Recent Labs  Lab 07/03/17 1203  AMMONIA 38*   Coagulation Profile: Recent Labs  Lab 07/05/17 0937  INR 1.20   Cardiac Enzymes: Recent Labs  Lab 07/03/17 1203  TROPONINI <0.03   BNP (last 3 results) No results for input(s): PROBNP in the last 8760 hours. HbA1C: No results for input(s): HGBA1C in the last 72 hours. CBG: No results for input(s): GLUCAP in the last 168 hours. Lipid Profile: No results for input(s): CHOL, HDL, LDLCALC, TRIG, CHOLHDL, LDLDIRECT in the last 72 hours. Thyroid Function Tests: No results for  input(s): TSH, T4TOTAL, FREET4, T3FREE, THYROIDAB in the last 72 hours. Anemia Panel: No results for input(s): VITAMINB12, FOLATE, FERRITIN, TIBC, IRON, RETICCTPCT in the last 72 hours. Sepsis Labs: No results for input(s): PROCALCITON, LATICACIDVEN in the last 168 hours.  Recent Results (from the past 240 hour(s))  Culture, sputum-assessment     Status: None   Collection Time: 07/03/17  8:19 PM  Result Value Ref Range Status   Specimen Description SPUTUM  Final   Special Requests NONE  Final   Sputum evaluation   Final    Sputum specimen not acceptable for testing.  Please recollect.   INFORMED RN @1323  ON 3.29.19 BY [facility] Performed at [facility], [place] [address] [place], [place] [address]    Report Status 07/06/2017 FINAL  Final  MRSA PCR Screening     Status: None   Collection Time: 07/06/17  8:05 AM  Result Value Ref Range Status   MRSA by PCR NEGATIVE NEGATIVE Final    Comment:        The GeneXpert MRSA Assay (FDA approved for NASAL specimens only), is one component of a comprehensive MRSA colonization surveillance program. It is not intended to diagnose MRSA infection nor to guide or monitor  treatment for MRSA infections. Performed at [facility], [address] [place], [place] [address]        Radiology Studies: No results found.    Scheduled Meds: . enoxaparin (LOVENOX) injection  40 mg Subcutaneous Q24H  . lactose free nutrition  237 mL Oral BID BM  . multivitamin  15 mL Oral Daily  . nicotine  21 mg Transdermal Daily  . pantoprazole sodium  40 mg Per Tube BID  . polyethylene glycol  17 g Oral Daily  . senna  1 tablet Oral Daily  . sodium chloride flush  3 mL Intravenous Q12H  . sucralfate  1 g Per Tube Q6H  . thiamine injection  100 mg Intravenous Daily   Continuous Infusions: . sodium chloride 75 mL/hr at 07/09/17 0845  . feeding supplement (OSMOLITE 1.5 CAL) 60 mL/hr at 07/08/17 2149  . magnesium sulfate 1 - 4 g bolus IVPB    . sodium glycerophosphate 0.9% NaCl IVPB       LOS: 6 days    Time spent: 25 minutes   Dessa Phi, DO [facility] [email or web address] Password [facility] 07/09/2017, 10:36 AM

## 2017-07-09 NOTE — Progress Notes (Signed)
Nutrition Follow-up  DOCUMENTATION CODES:   Underweight, Severe malnutrition in context of chronic illness  INTERVENTION:  - Continue Osmolite 1.5 @ 60 mL/hr. - If bolus TF desired for home, recommend 6 cans Osmolite 1.5/day with 100 mL free water with each TF bolus. Recommend the following regimen:  8 AM: 2 cans Osmolite 1.5 with 100 mL free water 12 PM: 1 can Osmolite 1.5 with 100 mL free water 4 PM: 2 cans Osmolite 1.5 with 100 mL free water 8 PM: 1 can Osmolite 1.5 with 100 mL free water  This regimen will provide 2130 kcal, 89 grams of protein, and 1486 mL free water.   NUTRITION DIAGNOSIS:   Severe Malnutrition related to chronic illness, dysphagia(esophgeal stenosis) as evidenced by percent weight loss, energy intake < or equal to 75% for > or equal to 1 month, severe fat depletion, severe muscle depletion. -ongoing  GOAL:   Patient will meet greater than or equal to 90% of their needs -met with TF regimen + PO intakes.   MONITOR:   PO intake, Supplement acceptance, TF tolerance, Weight trends, Labs, I & O's  ASSESSMENT:   Patient with PMH significant for esophagitis, severe esophageal stricture, alcohol abuse and Korsakoff syndrome, tobacco use, and protein-calorie malnutrition. Recently admitted 2/26 for extreme confusion. Presents this admission with complaints of shortness of breath and cough. Admitted for failure to thrive and ulcerative esophagitis with severe esophogeal stenosis.   Weight has been stable since admission on 3/26. Noted no BM since 3/28. Pt had PEG placed on 3/29 and is currently receiving Osmolite 1.5 @ 60 ml/hr which is providing 2160 kcal, 90 grams of protein, and 1097 mL free water.   He denies abdominal pain or pressure and feels concerned as he believes he should be feeling full in order for the tube feeding to be "working." Pt expresses desire for weight gain. He consumed a yogurt this AM and shortly after vomited. Reports that it looked like  the yogurt. He denies eating anything since that time. He has Boost Plus ordered BID but has mainly been refusing this supplement.   Pt reports that he is going home tomorrow. Review of Dr. Jeannine Kitten note from this AM indicates that pt declined SNF and that he may d/c home tomorrow dependent on labs. Hypomagnesemia with repletion ordered, hypophosphatemia with no repletion ordered.   Medications reviewed; 2 g IV Mg sulfate x1 run today, 15 mL liquid multivitamin/day, 40 mg Protonix per PEG BID, 1 packet Miralax/day, 1 tablet Senokot/day, 20 mmol GlycoPhos x1 run today, 1 g Carafate per PEG QID, 100 mg IV thiamine/day.  Labs reviewed; Na: 127 mmol/L, Cl: 95 mmol/L, creatinine: 0.46 mg/dL, Ca: 8.4 mg/dL, Phos: 1.9 mg/dL, Mg: 1.5 mg/dL.  IVF: NS @ 75 mL/hr.      Diet Order:  Diet full liquid Room service appropriate? Yes; Fluid consistency: Thin  EDUCATION NEEDS:   Education needs have been addressed  Skin:  Skin Assessment: Reviewed RN Assessment  Last BM:  3/28  Height:   Ht Readings from Last 1 Encounters:  07/04/17 '5\' 7"'$  (1.702 m)    Weight:   Wt Readings from Last 1 Encounters:  07/06/17 99 lb 6.8 oz (45.1 kg)    Ideal Body Weight:  67.3 kg  BMI:  Body mass index is 15.57 kg/m.  Estimated Nutritional Needs:   Kcal:  2000-2200 kcal/day  Protein:  90-100 g/day  Fluid:  >2 L/day      Patrick Matin, MS, RD, LDN, CNSC Inpatient  Clinical Dietitian Pager # [phone] After hours/weekend pager # [phone]

## 2017-07-09 NOTE — Progress Notes (Signed)
PT Cancellation Note  Patient Details Name: Patrick [name] MRN: [id number] DOB: 02/19/1951   Cancelled Treatment:    Reason Eval/Treat Not Completed: Patient declined, no reason specified Pt reports "I'm not doing that" after introduction and states, "I'm dying."  Pt requests ambulance transport home and wants to d/c today.  RN notified.   Torry Adamczak,KATHrine E 07/09/2017, 10:15 AM Carmelia Bake, PT, DPT 07/09/2017 Pager: [phone]

## 2017-07-09 NOTE — Care Management Note (Signed)
Case Management Note  Patient Details  Name: Patrick [name] MRN: [id number] Date of Birth: Nov 14, 1950  Subjective/Objective: Declines SNF.[facility] rep Jermaine aware of d/c in am home w/[facility]HC-HHRN-TF instruction,supplies,CSW. Await RD-to asst w/bolus feeds.PTAR for transp needed.DNR,forms on shadow chart.                   Action/Plan:d/c home w/HHC/PTAR.   Expected Discharge Date:  (unknown)               Expected Discharge Plan:  [place]  In-House Referral:  Clinical Social Work  Discharge planning Services  CM Consult  Post Acute Care Choice:    Choice offered to:  Patient  DME Arranged:  Tube feeding, Tube feeding pump DME Agency:  [place]:  RN, Social Work [employer] Agency:  [place]  Status of Service:  In process, will continue to follow  If discussed at Long Length of Stay Meetings, dates discussed:    Additional Comments:  Dessa Phi, RN 07/09/2017, 10:37 AM

## 2017-07-09 NOTE — Plan of Care (Signed)
  Problem: Clinical Measurements: Goal: Ability to maintain clinical measurements within normal limits will improve Outcome: Progressing Goal: Will remain free from infection Outcome: Progressing Goal: Diagnostic test results will improve Outcome: Progressing Goal: Respiratory complications will improve Outcome: Progressing Goal: Cardiovascular complication will be avoided Outcome: Progressing   Problem: Nutrition: Goal: Adequate nutrition will be maintained Outcome: Progressing   Problem: Elimination: Goal: Will not experience complications related to bowel motility Outcome: Progressing Goal: Will not experience complications related to urinary retention Outcome: Progressing   Problem: Pain Managment: Goal: General experience of comfort will improve Outcome: Progressing   Problem: Skin Integrity: Goal: Risk for impaired skin integrity will decrease Outcome: Progressing   

## 2017-07-09 NOTE — Progress Notes (Signed)
Met with Patrick [name] to complete advance directive.  Went over paperwork with pt, notarized documents with two witnesses.  Pt with original and copies.  Copy placed in chart.    [facility] / [facility] Chaplain [name], MDiv, [facility]

## 2017-07-10 LAB — BASIC METABOLIC PANEL
ANION GAP: 4 — AB (ref 5–15)
BUN: 9 mg/dL (ref 6–20)
CALCIUM: 8.4 mg/dL — AB (ref 8.9–10.3)
CO2: 28 mmol/L (ref 22–32)
Chloride: 97 mmol/L — ABNORMAL LOW (ref 101–111)
Creatinine, Ser: 0.5 mg/dL — ABNORMAL LOW (ref 0.61–1.24)
Glucose, Bld: 105 mg/dL — ABNORMAL HIGH (ref 65–99)
Potassium: 3.6 mmol/L (ref 3.5–5.1)
SODIUM: 129 mmol/L — AB (ref 135–145)

## 2017-07-10 LAB — MAGNESIUM: Magnesium: 2 mg/dL (ref 1.7–2.4)

## 2017-07-10 LAB — PHOSPHORUS: Phosphorus: 3.2 mg/dL (ref 2.5–4.6)

## 2017-07-10 MED ORDER — SUCRALFATE 1 GM/10ML PO SUSP: 1.0000 g | Freq: Four times a day (QID) | ORAL | 0 refills | Status: AC

## 2017-07-10 MED ORDER — MORPHINE SULFATE 20 MG/5ML PO SOLN: 2.5000 mg | Freq: Four times a day (QID) | ORAL | 0 refills | Status: DC | PRN

## 2017-07-10 MED ORDER — OSMOLITE 1.5 CAL PO LIQD: ORAL | 0 refills | Status: AC

## 2017-07-10 MED ORDER — MORPHINE SULFATE 15 MG PO TABS: 15.0000 mg | ORAL_TABLET | Freq: Four times a day (QID) | ORAL | 0 refills | Status: AC | PRN

## 2017-07-10 MED ORDER — PANTOPRAZOLE SODIUM 40 MG PO PACK: 40.0000 mg | PACK | Freq: Two times a day (BID) | ORAL | 0 refills | Status: AC

## 2017-07-10 NOTE — Progress Notes (Signed)
[facility] rep [name] will have TF supplies delivered to home once patient has arrived home since he is the only caregiver. Noted on d/c f/u to contact [facility] when home so supplies can be delivered. [facility]-will re inforce [place] teaching w/intermittent visits for TF.

## 2017-07-10 NOTE — Care Management Note (Signed)
Case Management Note  Patient Details  Name: LAMOND GLANTZ MRN: [id number] Date of Birth: 07-30-1950  Subjective/Objective: [facility]-HHRN-formula TF instruction, [place] social worker-Karen aware of d/c today. D/c home by PTAR-forms on shadow chart nurse can call when TF have been delivered.                   Action/Plan:d/c home w/HHC/TF/PTAR.   Expected Discharge Date:  (unknown)               Expected Discharge Plan:  [place]  In-House Referral:  Clinical Social Work  Discharge planning Services  CM Consult  Post Acute Care Choice:    Choice offered to:  Patient  DME Arranged:  Tube feeding, Tube feeding pump DME Agency:  [place]:  RN, Social Work [employer] Agency:  [place]  Status of Service:  In process, will continue to follow  If discussed at Long Length of Stay Meetings, dates discussed:    Additional Comments:  Dessa Phi, RN 07/10/2017, 9:49 AM

## 2017-07-10 NOTE — Discharge Summary (Addendum)
Physician Discharge Summary  Patrick [name] XLK:[id number] DOB: December 05, 1950 DOA: 07/03/2017  PCP: [name], MD  Admit date: 07/03/2017 Discharge date: 07/10/2017  Admitted From: Home Disposition:  Home, declined SNF despite recommendations many times   Recommendations for Outpatient Follow-up:  1. Follow up with PCP in 1 week 2. Follow up with [facility] 4/22 as scheduled  3. Please obtain BMP/Mg/Phos in 1 week  4. Pulmonary nodule measures 1.2 cm. Consider outpatient PET-CT  5. Tube feeding recommendations: recommend 6 cans Osmolite 1.5/day with 100 mL free water with each TF bolus. Recommend the following regimen: 8 AM: 2 cans Osmolite 1.5 with 100 mL free water 12 PM: 1 can Osmolite 1.5 with 100 mL free water 4 PM: 2 cans Osmolite 1.5 with 100 mL free water 8 PM: 1 can Osmolite 1.5 with 100 mL free water  Home Health: RN SW   Equipment/Devices: Tube feeding    Discharge Condition: Stable CODE STATUS: DNR  Diet recommendation: Full liquid diet and tube feed through PEG   Brief/Interim Summary: Patrick [name] is 67 year old male who presented with cough, weakness and failure to thrive. He does have asignificant past medical history of esophagitis with severe esophageal stricture, protein calorie malnutrition, tobacco and alcohol abusewithKorsakoff syndrome. He complained of generalized pain, decreased p.o. intake, significant weight loss.He has not been able to take medications or food by mouth. He takes a few ounces of milk/ice cream daily but has continued to lose weight steadily. His BMI is now 15. CXR demonstrated worsening LLL patchy density concerning for pneumonia and was started on antibiotics empirically. GI was consulted who recommended PEG tube placement placed by IR for enteral feeding.  Initially, patient was hesitant to agree for PEG tube.  Palliative care was consulted.  Patient eventually agreed for PEG tube, IR was consulted and PEG placed on 3/29.  His electrolytes were monitored and replaced as needed. He was discharged home as he continued to refuse SNF placement.   Discharge Diagnoses:  Principal Problem:   Failure to thrive in adult Active Problems:   TOBACCO USE   Alcohol induced Korsakoff syndrome (HCC)   Protein-calorie malnutrition, severe   Esophageal stricture   Left lower lobe pneumonia (HCC)   Prolonged QT interval  Failure to thrive secondary to his severe ulcerative esophagitis with stricture and inability to tolerate PO  -GI consulted.  EGD on 06/06/2017 with severe stenosis.  They recommended PEG tube for nutrition and eventual serial endoscopic dilatation as an outpatient -Continue PPI twice daily -Follow-up at [facility] in 4-5 weeks to determine if he will undergo serial outpatient endoscopic dilatation of the distal esophageal stricture -PEG tube placed 3/29, tolerated TF well. Electrolytes replaced as needed.   Left lower lobe pneumonia ruled out -Chest x-ray was most likely secondary to left basal atelectasis -Patient has no clinical signs of infectious etiology.  Antibiotics stopped -No further productive sputum to collect   Pulmonary nodule measures 1.2 cm -Consider outpatient PET-CT   Severe calorie protein malnutrition -PEG tube for enteral feeding  Tobacco abuse -Nicotine patch      Discharge Instructions  Discharge Instructions    Call MD for:  difficulty breathing, headache or visual disturbances   Complete by:  As directed    Call MD for:  extreme fatigue   Complete by:  As directed    Call MD for:  hives   Complete by:  As directed    Call MD for:  persistant dizziness or light-headedness  Complete by:  As directed    Call MD for:  persistant nausea and vomiting   Complete by:  As directed    Call MD for:  redness, tenderness, or signs of infection (pain, swelling, redness, odor or green/yellow discharge around incision site)   Complete by:  As directed    Call MD for:   severe uncontrolled pain   Complete by:  As directed    Call MD for:  temperature >100.4   Complete by:  As directed    Discharge instructions   Complete by:  As directed    You were cared for by a hospitalist during your hospital stay. If you have any questions about your discharge medications or the care you received while you were in the hospital after you are discharged, you can call the unit and asked to speak with the hospitalist on call if the hospitalist that took care of you is not available. Once you are discharged, your primary care physician will handle any further medical issues. Please note that NO REFILLS for any discharge medications will be authorized once you are discharged, as it is imperative that you return to your primary care physician (or establish a relationship with a primary care physician if you do not have one) for your aftercare needs so that they can reassess your need for medications and monitor your lab values.   Increase activity slowly   Complete by:  As directed      Allergies as of 07/10/2017   No Known Allergies     Medication List    STOP taking these medications   pantoprazole 40 MG tablet Commonly known as:  PROTONIX Replaced by:  pantoprazole sodium 40 mg/20 mL Pack     TAKE these medications   albuterol 108 (90 Base) MCG/ACT inhaler Commonly known as:  VENTOLIN HFA Inhale 2 puffs into the lungs every 4 (four) hours as needed for wheezing or shortness of breath.   feeding supplement (OSMOLITE 1.5 CAL) Liqd 6cans Osmolite 1.5/day with 100 mL free water with each TF bolus. Recommend the following regimen: 8 AM: 2 cans Osmolite 1.5 with 100 mL free water 12 PM: 1 can Osmolite 1.5 with 100 mL free water 4 PM: 2 cans Osmolite 1.5 with 100 mL free water 8 PM: 1 can Osmolite 1.5 with 100 mL free water What changed:    how much to take  how to take this  when to take this  additional instructions   magnesium oxide 400 (241.3 Mg) MG  tablet Commonly known as:  MAG-OX Take 1 tablet (400 mg total) by mouth daily.   morphine 15 MG tablet Commonly known as:  MSIR Take 1 tablet (15 mg total) by mouth every 6 (six) hours as needed for severe pain.   multivitamin Liqd Take 15 mLs by mouth daily.   nicotine 21 mg/24hr patch Commonly known as:  NICODERM CQ - dosed in mg/24 hours Place 1 patch (21 mg total) onto the skin daily.   pantoprazole sodium 40 mg/20 mL Pack Commonly known as:  PROTONIX Place 20 mLs (40 mg total) into feeding tube 2 (two) times daily. Replaces:  pantoprazole 40 MG tablet   sucralfate 1 GM/10ML suspension Commonly known as:  CARAFATE Place 10 mLs (1 g total) into feeding tube 4 (four) times daily. What changed:    how to take this  when to take this   thiamine 100 MG tablet Take 1 tablet (100 mg total) by mouth daily.  traZODone 50 MG tablet Commonly known as:  DESYREL Take 1 tablet (50 mg total) by mouth at bedtime.      Follow-up Information    [facility] Follow up.   Specialty:  [place]:  [place] nursing-instruction TF,supplies, [facility] social worker Contact information: [address] [place] [address] [phone]        [facility]. Go on 07/30/2017.   Specialty:  Gastroenterology Contact information: [place] [address] [phone]       [name], MD. Schedule an appointment as soon as possible for a visit in 1 week(s).   Specialty:  Family Medicine Contact information: [place] [address] [phone]          No Known Allergies  Consultations:  GI  IR  Palliative care   Procedures/Studies: Ct Abdomen Wo Contrast  Result Date: 07/04/2017 CLINICAL DATA:  Gastrostomy tube evaluation EXAM: CT ABDOMEN WITHOUT CONTRAST TECHNIQUE: Multidetector CT imaging of the abdomen was performed following the standard protocol without IV  contrast. COMPARISON:  01/04/2017 FINDINGS: Lower chest: Irregular nodules have developed at the left lung base. The largest lobulated nodule measures 1.2 cm. Hepatobiliary: Hypodensity in the right lobe of the liver is stable. Gallstones. Pancreas: Unremarkable Spleen: Unremarkable Adrenals/Urinary Tract: Right renal cyst. Left kidney is unremarkable. Adrenal glands are within normal limits. Stomach/Bowel: The stomach is opposed to the anterior abdominal wall. No obvious mass in the colon. No evidence of small-bowel obstruction. Vascular/Lymphatic: Atherosclerotic calcifications of the aorta. No abnormal adenopathy. Other: There is a small amount of free fluid anterior to the liver. Musculoskeletal: No vertebral compression. IMPRESSION: New nodules at the left lung base. The largest measures 1.2 cm. Malignancy is not excluded. Consider PET-CT. Free-fluid in the abdomen. Stomach is opposed to the anterior abdominal wall. Electronically Signed   By: Marybelle Killings M.D.   On: 07/04/2017 15:51   Dg Chest 2 View  Result Date: 07/04/2017 CLINICAL DATA:  History of esophagitis and stricture EXAM: CHEST - 2 VIEW COMPARISON:  07/03/2017, esophagram 06/07/2017, CT chest 11/29/2016 FINDINGS: Hyperinflation with minimal scarring at the left base. No acute consolidation or pleural effusion. Stable cardiomediastinal silhouette with moderate hiatal hernia. No pneumothorax. IMPRESSION: 1. Hyperinflation with patchy atelectasis or scar at the left base 2. Moderate hiatal hernia Electronically Signed   By: Donavan Foil M.D.   On: 07/04/2017 16:04   Dg Chest 2 View  Result Date: 07/03/2017 CLINICAL DATA:  Nausea and vomiting, worsening over the last week. EXAM: CHEST - 2 VIEW COMPARISON:  06/05/2017 FINDINGS: Heart size is normal. Right chest is clear. Hiatal hernia again noted. Worsened patchy density at the left base consistent with bronchopneumonia. Aortic atherosclerosis again noted. IMPRESSION: Worsened patchy density at  the left base consistent with bronchopneumonia. Hiatal hernia as seen previously. Aortic atherosclerosis. Electronically Signed   By: Nelson Chimes M.D.   On: 07/03/2017 13:23   Ct Head Wo Contrast  Result Date: 07/03/2017 CLINICAL DATA:  Altered mental status.  Nausea and vomiting EXAM: CT HEAD WITHOUT CONTRAST TECHNIQUE: Contiguous axial images were obtained from the base of the skull through the vertex without intravenous contrast. COMPARISON:  Head CT June 05, 2017 and brain MRI June 05, 2017 FINDINGS: Brain: There is mild diffuse atrophy. There is no intracranial mass, hemorrhage, extra-axial fluid collection, or midline shift. There is patchy small vessel disease in the centra semiovale bilaterally. Elsewhere gray-white compartments appear unremarkable. No acute infarct  evident. Vascular: No hyperdense vessel. There is calcification in each distal carotid siphon region. Skull: The bony calvarium appears intact. Sinuses/Orbits: There is mild mucosal thickening in several ethmoid air cells. Other visualized paranasal sinuses are clear. Orbits appear symmetric bilaterally. Other: Mastoid air cells are clear. IMPRESSION: Mild atrophy with patchy periventricular small vessel disease. No acute infarct evident. No mass or hemorrhage. There are foci of arterial vascular calcification. There is mucosal thickening in several ethmoid air cells. Electronically Signed   By: Lowella Grip III M.D.   On: 07/03/2017 12:31   Ir Gastrostomy Tube Mod Sed  Result Date: 07/06/2017 INDICATION: Severe esophageal stenosis EXAM: PERC PLACEMENT GASTROSTOMY MEDICATIONS: Ancef 2 g; Antibiotics were administered within 1 hour of the procedure. Glucagon 1 mg IV ANESTHESIA/SEDATION: Versed 1 mg IV; Fentanyl 50 mcg IV Moderate Sedation Time:  24 minutes The patient was continuously monitored during the procedure by the interventional radiology nurse under my direct supervision. CONTRAST:  66mL ISOVUE-300 IOPAMIDOL  (ISOVUE-300) INJECTION 61%, 58mL ISOVUE-300 IOPAMIDOL (ISOVUE-300) INJECTION 61% - administered into the gastric lumen. FLUOROSCOPY TIME:  Fluoroscopy Time: 2 minutes 18 seconds (27 mGy). COMPLICATIONS: None immediate. PROCEDURE: The procedure, risks, benefits, and alternatives were explained to the patient. Questions regarding the procedure were encouraged and answered. The patient understands and consents to the procedure. The epigastrium was prepped with Betadine in a sterile fashion, and a sterile drape was applied covering the operative field. A sterile gown and sterile gloves were used for the procedure. Glucagon was administered intravenously. A 5 French catheter was advanced from the oral cavity to the stomach. The stomach was distended with gas. An 18 gauge needle was advanced into the lumen of the stomach via the body. A T tack was deployed. A total of 3 T tacks were deployed in a triangular configuration. A needle was utilized to puncture the stomach between the T tacks. It was removed over an Amplatz wire. The tract was dilated to 20 [place]. A 20 French peel-away was inserted. The 16 French gastrostomy tube was inserted and the peel-away sheath was removed. The balloon was insufflated with 10 cc saline. Contrast was injected. FINDINGS: Images demonstrate placement of an 34 French balloon retention gastrostomy tube. IMPRESSION: Successful 86 French balloon retention gastrostomy tube placement. Electronically Signed   By: Marybelle Killings M.D.   On: 07/06/2017 15:03        Discharge Exam: Vitals:   07/09/17 2120 07/10/17 0601  BP: 111/65 99/67  Pulse: (!) 105 99  Resp: 18 16  Temp: 98.7 F (37.1 C) 98.7 F (37.1 C)  SpO2: 99% 100%    General: Pt is alert, awake, not in acute distress Cardiovascular: regular rhythm, tachycardic, S1/S2 +, no rubs, no gallops Respiratory: CTA bilaterally, no wheezing, no rhonchi Abdominal: Soft, NT, ND, bowel sounds +, +PEG  Extremities: no edema, no  cyanosis    The results of significant diagnostics from this hospitalization (including imaging, microbiology, ancillary and laboratory) are listed below for reference.     Microbiology: Recent Results (from the past 240 hour(s))  Culture, sputum-assessment     Status: None   Collection Time: 07/03/17  8:19 PM  Result Value Ref Range Status   Specimen Description SPUTUM  Final   Special Requests NONE  Final   Sputum evaluation   Final    Sputum specimen not acceptable for testing.  Please recollect.   INFORMED RN @1323  ON 3.29.19 BY [facility] Performed at [facility], [address] [place],  [place] [address]    Report Status 07/06/2017 FINAL  Final  MRSA PCR Screening     Status: None   Collection Time: 07/06/17  8:05 AM  Result Value Ref Range Status   MRSA by PCR NEGATIVE NEGATIVE Final    Comment:        The GeneXpert MRSA Assay (FDA approved for NASAL specimens only), is one component of a comprehensive MRSA colonization surveillance program. It is not intended to diagnose MRSA infection nor to guide or monitor treatment for MRSA infections. Performed at [facility], [place] [address], [place], [place] [address]      Labs: BNP (last 3 results) No results for input(s): BNP in the last 8760 hours. Basic Metabolic Panel: Recent Labs  Lab 07/06/17 0512 07/07/17 0458 07/07/17 2115 07/08/17 0445 07/09/17 0514 07/10/17 0536  NA 136 133*  --  132* 127* 129*  K 3.2* 3.8  --  4.1 3.8 3.6  CL 104 102  --  98* 95* 97*  CO2 28 26  --  29 26 28   GLUCOSE 93 110*  --  102* 111* 105*  BUN <5* <5*  --  6 8 9   CREATININE 0.50* 0.48*  --  0.51* 0.46* 0.50*  CALCIUM 8.4* 8.1*  --  8.5* 8.4* 8.4*  MG 1.3* 1.4*  --  1.6* 1.5* 2.0  PHOS  --  <1.0* 1.8* 1.8* 1.9* 3.2   Liver Function Tests: Recent Labs  Lab 07/03/17 1203  AST 94*  ALT 32  ALKPHOS 114  BILITOT 1.8*  PROT 8.1  ALBUMIN 3.4*   Recent Labs  Lab 07/03/17 1203  LIPASE  21   Recent Labs  Lab 07/03/17 1203  AMMONIA 38*   CBC: Recent Labs  Lab 07/03/17 1203 07/05/17 0521 07/06/17 0512  WBC 5.8 5.6 4.8  NEUTROABS 3.9 3.3 2.7  HGB 13.4 9.6* 9.4*  HCT 38.6* 28.2* 27.5*  MCV 86.2 87.0 86.5  PLT 334 197 157   Cardiac Enzymes: Recent Labs  Lab 07/03/17 1203  TROPONINI <0.03   BNP: Invalid input(s): POCBNP CBG: No results for input(s): GLUCAP in the last 168 hours. D-Dimer No results for input(s): DDIMER in the last 72 hours. Hgb A1c No results for input(s): HGBA1C in the last 72 hours. Lipid Profile No results for input(s): CHOL, HDL, LDLCALC, TRIG, CHOLHDL, LDLDIRECT in the last 72 hours. Thyroid function studies No results for input(s): TSH, T4TOTAL, T3FREE, THYROIDAB in the last 72 hours.  Invalid input(s): FREET3 Anemia work up No results for input(s): VITAMINB12, FOLATE, FERRITIN, TIBC, IRON, RETICCTPCT in the last 72 hours. Urinalysis    Component Value Date/Time   COLORURINE YELLOW 07/03/2017 2030   APPEARANCEUR CLEAR 07/03/2017 2030   LABSPEC 1.023 07/03/2017 2030   PHURINE 6.0 07/03/2017 2030   GLUCOSEU NEGATIVE 07/03/2017 2030   [place] NEGATIVE 07/03/2017 2030   HGBUR negative 02/21/2010 1055   BILIRUBINUR NEGATIVE 07/03/2017 2030   BILIRUBINUR n 03/11/2014 1025   KETONESUR 80 (A) 07/03/2017 2030   PROTEINUR NEGATIVE 07/03/2017 2030   UROBILINOGEN 0.2 03/11/2014 1025   UROBILINOGEN 0.2 02/21/2010 1055   NITRITE NEGATIVE 07/03/2017 2030   LEUKOCYTESUR NEGATIVE 07/03/2017 2030   Sepsis Labs Invalid input(s): PROCALCITONIN,  WBC,  LACTICIDVEN Microbiology Recent Results (from the past 240 hour(s))  Culture, sputum-assessment     Status: None   Collection Time: 07/03/17  8:19 PM  Result Value Ref Range Status   Specimen Description SPUTUM  Final   Special Requests NONE  Final  Sputum evaluation   Final    Sputum specimen not acceptable for testing.  Please recollect.   INFORMED RN @1323  ON 3.29.19 BY  [facility] Performed at [facility], [place] [address], [place], [place] [address]    Report Status 07/06/2017 FINAL  Final  MRSA PCR Screening     Status: None   Collection Time: 07/06/17  8:05 AM  Result Value Ref Range Status   MRSA by PCR NEGATIVE NEGATIVE Final    Comment:        The GeneXpert MRSA Assay (FDA approved for NASAL specimens only), is one component of a comprehensive MRSA colonization surveillance program. It is not intended to diagnose MRSA infection nor to guide or monitor treatment for MRSA infections. Performed at [facility], [place] [address] [place], [place] [address]      Patient was seen and examined on the day of discharge and was found to be in stable condition. Time coordinating discharge: 35 minutes including assessment and coordination of care, as well as examination of the patient.   SIGNED:  Dessa Phi, DO [facility] Pager [phone]  If 7PM-7AM, please contact night-coverage [email or web address] Password [email or web address] 07/10/2017, 10:04 AM

## 2017-07-10 NOTE — Progress Notes (Signed)
Patient educated on AVS and schedule of medications. Patient also return demonstrated and walked writer through how to give medications and TF boluses to himself via PEG tube. Patient verbalized understanding and understands he needs to call advance when he gets home so supplies can be delivered.

## 2017-07-10 NOTE — Progress Notes (Signed)
[facility] contacted for transport. Pt request to be taken to home address [address], [place], [place] [phone]

## 2017-07-13 ENCOUNTER — Emergency Department (HOSPITAL_COMMUNITY): Payer: Self-pay

## 2017-07-13 ENCOUNTER — Other Ambulatory Visit: Payer: Self-pay

## 2017-07-13 ENCOUNTER — Inpatient Hospital Stay (HOSPITAL_COMMUNITY)
Admission: EM | Admit: 2017-07-13 | Discharge: 2017-08-08 | DRG: 871 | Disposition: E | Payer: Self-pay | Attending: Pulmonary Disease | Admitting: Pulmonary Disease

## 2017-07-13 ENCOUNTER — Encounter (HOSPITAL_COMMUNITY): Payer: Self-pay

## 2017-07-13 DIAGNOSIS — L89152 Pressure ulcer of sacral region, stage 2: Secondary | ICD-10-CM | POA: Diagnosis present

## 2017-07-13 DIAGNOSIS — J869 Pyothorax without fistula: Secondary | ICD-10-CM | POA: Diagnosis present

## 2017-07-13 DIAGNOSIS — Z86010 Personal history of colonic polyps: Secondary | ICD-10-CM

## 2017-07-13 DIAGNOSIS — E43 Unspecified severe protein-calorie malnutrition: Secondary | ICD-10-CM | POA: Diagnosis present

## 2017-07-13 DIAGNOSIS — K219 Gastro-esophageal reflux disease without esophagitis: Secondary | ICD-10-CM | POA: Diagnosis present

## 2017-07-13 DIAGNOSIS — F329 Major depressive disorder, single episode, unspecified: Secondary | ICD-10-CM | POA: Diagnosis present

## 2017-07-13 DIAGNOSIS — N170 Acute kidney failure with tubular necrosis: Secondary | ICD-10-CM | POA: Diagnosis present

## 2017-07-13 DIAGNOSIS — F4321 Adjustment disorder with depressed mood: Secondary | ICD-10-CM | POA: Diagnosis present

## 2017-07-13 DIAGNOSIS — J939 Pneumothorax, unspecified: Secondary | ICD-10-CM

## 2017-07-13 DIAGNOSIS — F419 Anxiety disorder, unspecified: Secondary | ICD-10-CM | POA: Diagnosis present

## 2017-07-13 DIAGNOSIS — K449 Diaphragmatic hernia without obstruction or gangrene: Secondary | ICD-10-CM | POA: Diagnosis present

## 2017-07-13 DIAGNOSIS — S271XXA Traumatic hemothorax, initial encounter: Secondary | ICD-10-CM | POA: Diagnosis present

## 2017-07-13 DIAGNOSIS — J9601 Acute respiratory failure with hypoxia: Secondary | ICD-10-CM | POA: Diagnosis present

## 2017-07-13 DIAGNOSIS — F1096 Alcohol use, unspecified with alcohol-induced persisting amnestic disorder: Secondary | ICD-10-CM | POA: Diagnosis present

## 2017-07-13 DIAGNOSIS — Z87442 Personal history of urinary calculi: Secondary | ICD-10-CM

## 2017-07-13 DIAGNOSIS — R109 Unspecified abdominal pain: Secondary | ICD-10-CM

## 2017-07-13 DIAGNOSIS — R6521 Severe sepsis with septic shock: Secondary | ICD-10-CM | POA: Diagnosis present

## 2017-07-13 DIAGNOSIS — Z66 Do not resuscitate: Secondary | ICD-10-CM | POA: Diagnosis present

## 2017-07-13 DIAGNOSIS — R64 Cachexia: Secondary | ICD-10-CM | POA: Diagnosis present

## 2017-07-13 DIAGNOSIS — E872 Acidosis: Secondary | ICD-10-CM | POA: Diagnosis present

## 2017-07-13 DIAGNOSIS — R627 Adult failure to thrive: Secondary | ICD-10-CM | POA: Diagnosis present

## 2017-07-13 DIAGNOSIS — K222 Esophageal obstruction: Secondary | ICD-10-CM | POA: Diagnosis present

## 2017-07-13 DIAGNOSIS — J690 Pneumonitis due to inhalation of food and vomit: Secondary | ICD-10-CM | POA: Diagnosis present

## 2017-07-13 DIAGNOSIS — K2210 Ulcer of esophagus without bleeding: Secondary | ICD-10-CM | POA: Diagnosis present

## 2017-07-13 DIAGNOSIS — D6489 Other specified anemias: Secondary | ICD-10-CM | POA: Diagnosis present

## 2017-07-13 DIAGNOSIS — J948 Other specified pleural conditions: Secondary | ICD-10-CM | POA: Diagnosis present

## 2017-07-13 DIAGNOSIS — R0603 Acute respiratory distress: Secondary | ICD-10-CM | POA: Diagnosis present

## 2017-07-13 DIAGNOSIS — D638 Anemia in other chronic diseases classified elsewhere: Secondary | ICD-10-CM | POA: Diagnosis present

## 2017-07-13 DIAGNOSIS — F17210 Nicotine dependence, cigarettes, uncomplicated: Secondary | ICD-10-CM | POA: Diagnosis present

## 2017-07-13 DIAGNOSIS — Z79899 Other long term (current) drug therapy: Secondary | ICD-10-CM

## 2017-07-13 DIAGNOSIS — S2232XA Fracture of one rib, left side, initial encounter for closed fracture: Secondary | ICD-10-CM | POA: Diagnosis present

## 2017-07-13 DIAGNOSIS — K223 Perforation of esophagus: Secondary | ICD-10-CM | POA: Diagnosis present

## 2017-07-13 DIAGNOSIS — Z79891 Long term (current) use of opiate analgesic: Secondary | ICD-10-CM

## 2017-07-13 DIAGNOSIS — Z515 Encounter for palliative care: Secondary | ICD-10-CM | POA: Diagnosis not present

## 2017-07-13 DIAGNOSIS — A419 Sepsis, unspecified organism: Principal | ICD-10-CM | POA: Diagnosis present

## 2017-07-13 DIAGNOSIS — Z681 Body mass index (BMI) 19 or less, adult: Secondary | ICD-10-CM

## 2017-07-13 DIAGNOSIS — Z8249 Family history of ischemic heart disease and other diseases of the circulatory system: Secondary | ICD-10-CM

## 2017-07-13 DIAGNOSIS — Z8719 Personal history of other diseases of the digestive system: Secondary | ICD-10-CM

## 2017-07-13 DIAGNOSIS — Z931 Gastrostomy status: Secondary | ICD-10-CM

## 2017-07-13 LAB — DIFFERENTIAL
BASOS PCT: 0 %
Basophils Absolute: 0 10*3/uL (ref 0.0–0.1)
Eosinophils Absolute: 0 10*3/uL (ref 0.0–0.7)
Eosinophils Relative: 0 %
LYMPHS PCT: 15 %
Lymphs Abs: 1.9 10*3/uL (ref 0.7–4.0)
MONOS PCT: 8 %
Monocytes Absolute: 1 10*3/uL (ref 0.1–1.0)
Neutro Abs: 9.8 10*3/uL — ABNORMAL HIGH (ref 1.7–7.7)
Neutrophils Relative %: 77 %

## 2017-07-13 LAB — BLOOD GAS, ARTERIAL
Acid-base deficit: 6.5 mmol/L — ABNORMAL HIGH (ref 0.0–2.0)
Bicarbonate: 19.8 mmol/L — ABNORMAL LOW (ref 20.0–28.0)
DRAWN BY: 257701
FIO2: 50
O2 SAT: 95.3 %
PATIENT TEMPERATURE: 99.8
PH ART: 7.243 — AB (ref 7.350–7.450)
pCO2 arterial: 48.1 mmHg — ABNORMAL HIGH (ref 32.0–48.0)
pO2, Arterial: 98.8 mmHg (ref 83.0–108.0)

## 2017-07-13 LAB — COMPREHENSIVE METABOLIC PANEL
ALT: 18 U/L (ref 17–63)
AST: 40 U/L (ref 15–41)
Albumin: 2.6 g/dL — ABNORMAL LOW (ref 3.5–5.0)
Alkaline Phosphatase: 147 U/L — ABNORMAL HIGH (ref 38–126)
Anion gap: 11 (ref 5–15)
BUN: 13 mg/dL (ref 6–20)
CHLORIDE: 99 mmol/L — AB (ref 101–111)
CO2: 26 mmol/L (ref 22–32)
CREATININE: 0.6 mg/dL — AB (ref 0.61–1.24)
Calcium: 8.6 mg/dL — ABNORMAL LOW (ref 8.9–10.3)
GFR calc Af Amer: >60 mL/min (ref >60)
GFR calc non Af Amer: >60 mL/min (ref >60)
Glucose, Bld: 127 mg/dL — ABNORMAL HIGH (ref 65–99)
POTASSIUM: 3.7 mmol/L (ref 3.5–5.1)
Sodium: 136 mmol/L (ref 135–145)
TOTAL PROTEIN: 6.2 g/dL — AB (ref 6.5–8.1)
Total Bilirubin: 0.5 mg/dL (ref 0.3–1.2)

## 2017-07-13 LAB — CBC
HCT: 31.3 % — ABNORMAL LOW (ref 39.0–52.0)
HEMOGLOBIN: 10.5 g/dL — AB (ref 13.0–17.0)
MCH: 29.9 pg (ref 26.0–34.0)
MCHC: 33.5 g/dL (ref 30.0–36.0)
MCV: 89.2 fL (ref 78.0–100.0)
Platelets: 419 10*3/uL — ABNORMAL HIGH (ref 150–400)
RBC: 3.51 MIL/uL — ABNORMAL LOW (ref 4.22–5.81)
RDW: 15.4 % (ref 11.5–15.5)
WBC: 12.7 10*3/uL — ABNORMAL HIGH (ref 4.0–10.5)

## 2017-07-13 LAB — URINALYSIS, ROUTINE W REFLEX MICROSCOPIC
Bilirubin Urine: NEGATIVE
Glucose, UA: NEGATIVE mg/dL
HGB URINE DIPSTICK: NEGATIVE
Ketones, ur: 5 mg/dL — AB
Leukocytes, UA: NEGATIVE
Nitrite: NEGATIVE
PH: 6 (ref 5.0–8.0)
Protein, ur: NEGATIVE mg/dL
SPECIFIC GRAVITY, URINE: 1.012 (ref 1.005–1.030)

## 2017-07-13 LAB — GLUCOSE, PLEURAL OR PERITONEAL FLUID: GLUCOSE FL: 66 mg/dL

## 2017-07-13 LAB — BODY FLUID CELL COUNT WITH DIFFERENTIAL
Monocyte-Macrophage-Serous Fluid: 28 % — ABNORMAL LOW (ref 50–90)
NEUTROPHIL FLUID: 72 % — AB (ref 0–25)
Total Nucleated Cell Count, Fluid: UNDETERMINED cu mm (ref 0–1000)

## 2017-07-13 LAB — POC OCCULT BLOOD, ED: Fecal Occult Bld: NEGATIVE

## 2017-07-13 LAB — LIPASE, BLOOD: LIPASE: 24 U/L (ref 11–51)

## 2017-07-13 LAB — PROTEIN, PLEURAL OR PERITONEAL FLUID: Total protein, fluid: <3 g/dL

## 2017-07-13 MED ORDER — PIPERACILLIN-TAZOBACTAM 3.375 G IVPB 30 MIN
3.3750 g | Freq: Once | INTRAVENOUS | Status: AC
  Administered 2017-07-13: 3.375 g via INTRAVENOUS
  Filled 2017-07-13: qty 50

## 2017-07-13 MED ORDER — HYDROMORPHONE HCL 1 MG/ML IJ SOLN
1.0000 mg | Freq: Once | INTRAMUSCULAR | Status: AC
  Administered 2017-07-13: 1 mg via INTRAVENOUS
  Filled 2017-07-13: qty 1

## 2017-07-13 MED ORDER — SODIUM CHLORIDE 0.9 % IV BOLUS
1000.0000 mL | Freq: Once | INTRAVENOUS | Status: AC
  Administered 2017-07-13: 1000 mL via INTRAVENOUS

## 2017-07-13 MED ORDER — IOPAMIDOL (ISOVUE-300) INJECTION 61%
INTRAVENOUS | Status: AC
  Filled 2017-07-13: qty 100

## 2017-07-13 MED ORDER — LIDOCAINE HCL (PF) 1 % IJ SOLN
INTRAMUSCULAR | Status: AC | PRN
  Administered 2017-07-13: 2 mL via INTRADERMAL

## 2017-07-13 MED ORDER — VANCOMYCIN HCL IN DEXTROSE 750-5 MG/150ML-% IV SOLN
750.0000 mg | INTRAVENOUS | Status: DC
  Filled 2017-07-13: qty 150

## 2017-07-13 MED ORDER — MORPHINE SULFATE (PF) 2 MG/ML IV SOLN
2.0000 mg | INTRAVENOUS | Status: DC | PRN
  Administered 2017-07-13: 2 mg via INTRAVENOUS
  Filled 2017-07-13: qty 1

## 2017-07-13 MED ORDER — SODIUM CHLORIDE 0.9 % IV SOLN
INTRAVENOUS | Status: DC
  Administered 2017-07-13: 15:00:00 via INTRAVENOUS

## 2017-07-13 MED ORDER — ONDANSETRON HCL 4 MG/2ML IJ SOLN: 4.0000 mg | Freq: Four times a day (QID) | INTRAMUSCULAR | Status: DC | PRN

## 2017-07-13 MED ORDER — VANCOMYCIN HCL IN DEXTROSE 1-5 GM/200ML-% IV SOLN
1000.0000 mg | Freq: Once | INTRAVENOUS | Status: AC
  Administered 2017-07-13: 1000 mg via INTRAVENOUS
  Filled 2017-07-13: qty 200

## 2017-07-13 MED ORDER — MORPHINE SULFATE (PF) 4 MG/ML IV SOLN
2.0000 mg | INTRAVENOUS | Status: DC | PRN
  Administered 2017-07-13 – 2017-07-14 (×3): 2 mg via INTRAVENOUS
  Filled 2017-07-13 (×3): qty 1

## 2017-07-13 MED ORDER — ONDANSETRON HCL 4 MG/2ML IJ SOLN
4.0000 mg | Freq: Once | INTRAMUSCULAR | Status: AC
  Administered 2017-07-13: 4 mg via INTRAVENOUS
  Filled 2017-07-13: qty 2

## 2017-07-13 MED ORDER — LIDOCAINE HCL (PF) 1 % IJ SOLN
INTRAMUSCULAR | Status: AC
  Filled 2017-07-13: qty 30

## 2017-07-13 MED ORDER — FENTANYL CITRATE (PF) 100 MCG/2ML IJ SOLN
50.0000 ug | Freq: Once | INTRAMUSCULAR | Status: AC
  Administered 2017-07-13: 50 ug via INTRAVENOUS
  Filled 2017-07-13: qty 2

## 2017-07-13 MED ORDER — IOPAMIDOL (ISOVUE-300) INJECTION 61%
INTRAVENOUS | Status: AC
  Filled 2017-07-13: qty 30

## 2017-07-13 MED ORDER — IOPAMIDOL (ISOVUE-300) INJECTION 61%
30.0000 mL | Freq: Once | INTRAVENOUS | Status: AC | PRN
  Administered 2017-07-13: 30 mL via ORAL

## 2017-07-13 MED ORDER — PANTOPRAZOLE SODIUM 40 MG IV SOLR
40.0000 mg | INTRAVENOUS | Status: DC
  Administered 2017-07-13: 40 mg via INTRAVENOUS
  Filled 2017-07-13: qty 40

## 2017-07-13 MED ORDER — SODIUM CHLORIDE 0.9 % IV SOLN
INTRAVENOUS | Status: AC | PRN
  Administered 2017-07-13: 2000 mL via INTRAVENOUS

## 2017-07-13 MED ORDER — PIPERACILLIN-TAZOBACTAM 3.375 G IVPB
3.3750 g | Freq: Three times a day (TID) | INTRAVENOUS | Status: DC
  Administered 2017-07-13 – 2017-07-14 (×3): 3.375 g via INTRAVENOUS
  Filled 2017-07-13 (×3): qty 50

## 2017-07-13 MED ORDER — ONDANSETRON HCL 4 MG/2ML IJ SOLN
4.0000 mg | Freq: Once | INTRAMUSCULAR | Status: AC | PRN
  Administered 2017-07-13: 4 mg via INTRAVENOUS
  Filled 2017-07-13: qty 2

## 2017-07-13 MED ORDER — LORAZEPAM 2 MG/ML IJ SOLN
1.0000 mg | INTRAMUSCULAR | Status: DC | PRN
  Administered 2017-07-13 – 2017-07-14 (×4): 1 mg via INTRAVENOUS
  Filled 2017-07-13 (×4): qty 1

## 2017-07-13 MED ORDER — IOPAMIDOL (ISOVUE-300) INJECTION 61%
100.0000 mL | Freq: Once | INTRAVENOUS | Status: AC | PRN
  Administered 2017-07-13: 100 mL via INTRAVENOUS

## 2017-07-13 NOTE — ED Notes (Signed)
Patients RR at 30. Patients oxygen Sats 88-91. Patient placed on 2L oxygen. Saturation 94%.

## 2017-07-13 NOTE — ED Notes (Signed)
Drew patients lactic, got as much as I could. Per mini lab, unable to run due to insufficient amount. Dr. Roxanne Mins aware.

## 2017-07-13 NOTE — ED Notes (Signed)
Patient yelling and screaming in room for help-in to assist patient and administer meds-patient had stood at end of bed and voided urine all over the floor. Patient has pulled off all electrodes, pulse ox and disconnected BP cuff. Patient sttaes he does not want to keep them on. Patient demanding "Morphine"-explained he was ordered Fentanyl-patient has pulled out IV-IV restarted-difficult stick due to poor peripheral veins-multiple bruising from previous IV attempts. Patient cleaned-EVS called for floor. Monitor replaced with pulse ox and NIBP cuff

## 2017-07-13 NOTE — H&P (Signed)
PULMONARY / CRITICAL CARE MEDICINE   Name: Patrick [name] MRN: [id number] DOB: [birth year]    ADMISSION DATE:  07/14/2017 CONSULTATION DATE:  4/5  REFERRING MD:  Eulis Foster   CHIEF COMPLAINT:  Pneumothorax   HISTORY OF PRESENT ILLNESS:   67 year old male w/ multiple co-morbids specifically: esophageal stricture, erosive esophagitis, dysphagia w/ aspiration, prior ETOH abuse and failure to thrive w/ what he describes as a 50 lb wt loss in last 5 months. Recently had G-tube placed back on 3/29 but this has not been used yet d/t home health scheduling. Just d/cd 4/2.  Presents to ED on 4/5 w/ cc: rib cage hurting and stomach pain (onset the night prior). He was significantly short of breath w/ room air pulse ox at 88%. CT chest was obtained and this demonstrated: large bilateral pleural effusions w/ also left PTX. Also bilateral airspace disease AND posterior 10th rib fracture. A thoracostomy tube was placed by the EDP. Surgical services were consulted and as he was also hypotensive PCCM was called to admit   PAST MEDICAL HISTORY :  He  has a past medical history of Colon polyps, Depression, Diverticulitis, Diverticulosis of colon, GERD (gastroesophageal reflux disease), and Kidney stone.  PAST SURGICAL HISTORY: He  has a past surgical history that includes Colonoscopy; Upper gastrointestinal endoscopy; Esophagogastroduodenoscopy (egd) with propofol (N/A, 12/21/2016); Esophagogastroduodenoscopy (N/A, 06/06/2017); and IR GASTROSTOMY TUBE MOD SED (07/06/2017).  No Known Allergies  No current facility-administered medications on file prior to encounter.    Current Outpatient Medications on File Prior to Encounter  Medication Sig  . albuterol (VENTOLIN HFA) 108 (90 BASE) MCG/ACT inhaler Inhale 2 puffs into the lungs every 4 (four) hours as needed for wheezing or shortness of breath.  . magnesium oxide (MAG-OX) 400 (241.3 Mg) MG tablet Take 1 tablet (400 mg total) by mouth daily.  Marland Kitchen morphine (MSIR) 15 MG  tablet Take 1 tablet (15 mg total) by mouth every 6 (six) hours as needed for severe pain.  . Nutritional Supplements (FEEDING SUPPLEMENT, OSMOLITE 1.5 CAL,) LIQD 6cans Osmolite 1.5/day with 100 mL free water with each TF bolus. Recommend the following regimen: 8 AM: 2 cans Osmolite 1.5 with 100 mL free water 12 PM: 1 can Osmolite 1.5 with 100 mL free water 4 PM: 2 cans Osmolite 1.5 with 100 mL free water 8 PM: 1 can Osmolite 1.5 with 100 mL free water  . pantoprazole sodium (PROTONIX) 40 mg/20 mL PACK Place 20 mLs (40 mg total) into feeding tube 2 (two) times daily.  . sucralfate (CARAFATE) 1 GM/10ML suspension Place 10 mLs (1 g total) into feeding tube 4 (four) times daily.  . traZODone (DESYREL) 50 MG tablet Take 1 tablet (50 mg total) by mouth at bedtime.  . thiamine 100 MG tablet Take 1 tablet (100 mg total) by mouth daily. (Patient not taking: Reported on 06/27/2017)    FAMILY HISTORY:  His indicated that his mother is deceased. He indicated that his father is deceased.   SOCIAL HISTORY: He  reports that he has been smoking cigarettes.  He has been smoking about 1.00 pack per day. He has never used smokeless tobacco. He reports that he drinks about 8.4 oz of alcohol per week. He reports that he does not use drugs.  REVIEW OF SYSTEMS:   Gen: generalized weakness. Poor po intake. Wt loss HENT: (-). Pulm: diffuse chest wall/"rib pain", + short of breath, (-) cough. Card: (+) hypotension. CP as noted. Abd (+) abd pain neuro no  issues  SUBJECTIVE:  Marked pain   VITAL SIGNS: Blood Pressure (Abnormal) 89/58   Pulse (Abnormal) 137   Temperature 99 F (37.2 C) (Oral)   Respiration (Abnormal) 24   Oxygen Saturation 100%   HEMODYNAMICS:    VENTILATOR SETTINGS: FiO2 (%):  [50 %] 50 %  INTAKE / OUTPUT: No intake/output data recorded.  PHYSICAL EXAMINATION: General: Frail 67 year old male patient currently in marked discomfort due to generalized rib pain and recent chest tube  placement Neuro: Awake alert no focal deficits HEENT: Temporal wasting mucous membranes dry no jugular venous distention Cardiovascular: Tachycardic regular rate and rhythm Lungs: Scattered rhonchi, left pleural rub.  Left chest tube at 20 cm wall water suction.  No air leak currently, bilious brown appearing pleural fluid Abdomen: Tender.  PEG tube unremarkable.  Hypoactive bowel sounds Musculoskeletal: Generalized weakness poor muscle bulk Skin: Warm and dry  LABS:  BMET Recent Labs  Lab 07/09/17 0514 07/10/17 0536 08/05/2017 0305  NA 127* 129* 136  K 3.8 3.6 3.7  CL 95* 97* 99*  CO2 26 28 26   BUN 8 9 13   CREATININE 0.46* 0.50* 0.60*  GLUCOSE 111* 105* 127*    Electrolytes Recent Labs  Lab 07/08/17 0445 07/09/17 0514 07/10/17 0536 08/05/2017 0305  CALCIUM 8.5* 8.4* 8.4* 8.6*  MG 1.6* 1.5* 2.0  --   PHOS 1.8* 1.9* 3.2  --     CBC Recent Labs  Lab 07/18/2017 0305  WBC 12.7*  HGB 10.5*  HCT 31.3*  PLT 419*    Coag's No results for input(s): APTT, INR in the last 168 hours.  Sepsis Markers No results for input(s): LATICACIDVEN, PROCALCITON, O2SATVEN in the last 168 hours.  ABG Recent Labs  Lab 08/07/2017 1156  PHART 7.243*  PCO2ART 48.1*  PO2ART 98.8    Liver Enzymes Recent Labs  Lab 07/19/2017 0305  AST 40  ALT 18  ALKPHOS 147*  BILITOT 0.5  ALBUMIN 2.6*    Cardiac Enzymes No results for input(s): TROPONINI, PROBNP in the last 168 hours.  Glucose No results for input(s): GLUCAP in the last 168 hours.  Imaging Ct Abdomen Pelvis W Contrast  Result Date: 08/06/2017 CLINICAL DATA:  Fever, weakness, abdominal pain EXAM: CT ABDOMEN AND PELVIS WITH CONTRAST TECHNIQUE: Multidetector CT imaging of the abdomen and pelvis was performed using the standard protocol following bolus administration of intravenous contrast. CONTRAST:  137mL ISOVUE-300 IOPAMIDOL (ISOVUE-300) INJECTION 61% COMPARISON:  07/04/2017 FINDINGS: Lower chest: Large bilateral pleural  effusions. The left pleural effusion is higher density relative to the right and does not measure water density, likely hemothorax. Heart is normal size. Large hiatal hernia. There is a left pneumothorax, moderate-sized noted at the left lung base. Airspace opacities in both lower lobes could reflect atelectasis, or infiltrates. There is a posterior left 10th rib fracture. Hepatobiliary: Gallbladder is contracted, grossly unremarkable. Small hypodensity centrally in the right attic lobe most compatible with small cyst. Pancreas: No focal abnormality or ductal dilatation. Spleen: No focal abnormality.  Normal size. Adrenals/Urinary Tract: Simple appearing 3 cm cyst in the midpole of the right kidney. No hydronephrosis. No adrenal mass. Urinary bladder decompressed, grossly unremarkable. Stomach/Bowel: Sigmoid diverticulosis. No active diverticulitis. Large stool burden in the colon. Gastrostomy tube noted in the stomach. No evidence of bowel obstruction. Vascular/Lymphatic: Aortic atherosclerosis. No enlarged abdominal or pelvic lymph nodes.1 Reproductive: Prominent prostate with central calcifications. Other: No free fluid or free air. Musculoskeletal: Posterior left 10th rib fracture as noted above. No additional acute bony abnormality.  IMPRESSION: Moderate-sized left pneumothorax and large left hemothorax. Large right pleural effusion. Posterior left 10th rib fracture. Large hiatal hernia. Colonic diverticulosis. Left colonic diverticulosis. No active diverticulitis. Large stool burden in the colon. Aortic atherosclerosis. Prostate enlargement with central calcifications. These results were called by telephone at the time of interpretation on 07/16/2017 at 9:47 am to Dr. Eulis Foster , who verbally acknowledged these results. Electronically Signed   By: Rolm Baptise M.D.   On: 07/11/2017 09:50   Dg Chest Port 1 View  Result Date: 07/23/2017 CLINICAL DATA:  Chest tube placement for pneumothorax EXAM: PORTABLE CHEST 1  VIEW COMPARISON:  July 13, 2017 study obtained earlier in the day FINDINGS: There is now a chest tube on the left. There is currently a minimal residual left apical pneumothorax. There has been resolution of most of the effusion on the left as well. There is a small residual left pleural effusion. There is patchy atelectasis bilaterally with a fairly small right pleural effusion. Heart is upper normal in size with pulmonary vascularity within normal limits. No adenopathy. There is aortic atherosclerosis. No bone lesions. IMPRESSION: Minimal residual pneumothorax and effusion on the left after chest tube placement. There is a small right pleural effusion. There is patchy bibasilar atelectasis. Stable cardiac silhouette. There is aortic atherosclerosis. Aortic Atherosclerosis (ICD10-I70.0). Electronically Signed   By: Lowella Grip III M.D.   On: 08/03/2017 10:54   Dg Chest Port 1 View  Result Date: 08/06/2017 CLINICAL DATA:  Shortness of Breath EXAM: PORTABLE CHEST 1 VIEW COMPARISON:  July 04, 2017 FINDINGS: There is a focal pneumothorax on the left of apical and lateral in location without appreciable tension component. There is also moderate pleural effusion on the left. There is airspace consolidation in the left mid and lower lung zones. There is patchy infiltrate and effusion in the right base. Heart size and pulmonary vascularity are normal. No adenopathy. No bone lesions. IMPRESSION: Moderate left-sided hydropneumothorax without tension component. Airspace consolidation left lower lobe. A lesser degree of consolidation noted in right base. There is also a lesser degree of pleural effusion on the right. Stable cardiac silhouette. Electronically Signed   By: Lowella Grip III M.D.   On: 08/02/2017 09:32     STUDIES:  CT abd/pelvis 4/4: Moderate-sized left pneumothorax and large left hemothorax. Large right pleural effusion. Posterior left 10th rib fracture. Large hiatal hernia. Colonic  diverticulosis. Left colonic diverticulosis. No active diverticulitis. Large stool burden in the colon. Aortic atherosclerosis. Prostate enlargement with central calcifications.   CULTURES: Pleural fluid 4/5>>>  ANTIBIOTICS: Zosyn 4/5>>> vanc 4/5>>>  SIGNIFICANT EVENTS:   LINES/TUBES: Left chest tube 4/5>>>  DISCUSSION: Little to offer other than support here. He is NOT a surgical candidate for chest surgery and the likelihood of this being esophageal tear is very high.  Will admit to SDU Abx, IVFs chest tube & pain management  No escalation Doubt he will survive   ASSESSMENT / PLAN:  Acute hypoxic respiratory failure in setting of bilateral pleural effusions/probable empyema, left PTX and pneumonia.  -all likely stemming from esophageal stricture aspiration PNA and now w/ esophageal tear and infected pleural space.  Plan Supplemental oxygen  Pulse ox Morphine for pain and ativan for anxiety Chest tube management w/ 20 cm H2O suction vanc and zosyn day # 1; f/u pleural cultures DO NOT INTUBATE/FULL DNR  Sepsis 2/2 above Plan IVFs abx  No pressors  No further escalation   Esophageal stricture Dysphagia  Concern for esophageal tear Plan NPO  Holding off on confirmatory diagnostics as NOT surgical candidate and indicates clearly that he would NOT want to have surgery if offered.  Supportive care as above  At risk for fluid and electrolyte imbalance  Plan Trend chemistry   Anemia of chronic illness Plan Trend cbc   Erick Colace ACNP-BC [place] Pager # [phone] OR # [phone] if no answer   07/19/2017, 3:25 PM

## 2017-07-13 NOTE — ED Notes (Signed)
ED TO INPATIENT HANDOFF REPORT  Name/Age/Gender Patrick [name] 67 y.o. male  Code Status    Code Status Orders  (From admission, onward)        Start     Ordered   07/09/2017 1227  Do not attempt resuscitation (DNR)  Continuous    Question Answer Comment  In the event of cardiac or respiratory ARREST Do not call a "code blue"   In the event of cardiac or respiratory ARREST Do not perform Intubation, CPR, defibrillation or ACLS   In the event of cardiac or respiratory ARREST Use medication by any route, position, wound care, and other measures to relive pain and suffering. May use oxygen, suction and manual treatment of airway obstruction as needed for comfort.      07/19/2017 1226    Code Status History    Date Active Date Inactive Code Status Order ID Comments User Context   07/04/2017 1659 07/10/2017 1937 DNR [id number]  Micheline Rough, MD Inpatient   07/03/2017 2018 07/04/2017 1659 Full Code [id number]  Patrecia Pour, MD ED   06/05/2017 1120 06/09/2017 1925 Full Code [id number]  Charlynne Cousins, MD ED   04/20/2017 2311 04/27/2017 2133 DNR [id number]  Gwynne Edinger, MD Inpatient   04/11/2017 1510 04/17/2017 0207 DNR [id number]  Knox Royalty, NP Inpatient   04/10/2017 1114 04/11/2017 1510 Full Code [id number]  Sampson Goon, MD ED   04/09/2017 2103 04/10/2017 1114 Full Code [id number]  Phillips Grout, MD ED   01/04/2017 0746 01/08/2017 2146 Full Code [id number]  Reyne Dumas, MD ED   12/20/2016 1638 12/23/2016 2207 Full Code [id number]  Annita Brod, MD Inpatient   11/25/2016 0458 12/01/2016 1833 Full Code [id number]  Norval Morton, MD ED   11/25/2016 0140 11/25/2016 0458 Full Code [id number]  Beverely Pace ED    Advance Directive Documentation     Most Recent Value  Type of Advance Directive  Out of facility DNR (pink MOST or yellow form)  Pre-existing out of facility DNR order (yellow form or pink MOST form)  Yellow form placed in chart (order not valid for inpatient use)  "MOST" Form  in Place?  -      Home/SNF/Other Home  Chief Complaint Abdominal Pain;Emesis  Level of Care/Admitting Diagnosis ED Disposition    ED Disposition Condition [place]: [facility] [[id number]]  Level of Care: Stepdown [14]  Admit to SDU based on following criteria: Respiratory Distress:  Frequent assessment and/or intervention to maintain adequate ventilation/respiration, pulmonary toilet, and respiratory treatment.  Diagnosis: Empyema [facility]) [[id number]]  Admitting Physician: Chesley Mires [[id number]]  Attending Physician: Chesley Mires [[id number]]  Estimated length of stay: 5 - 7 days  Certification:: I certify this patient will need inpatient services for at least 2 midnights  PT Class (Do Not Modify): Inpatient [101]  PT Acc Code (Do Not Modify): Private [1]       Medical History Past Medical History:  Diagnosis Date  . Colon polyps   . Depression   . Diverticulitis   . Diverticulosis of colon   . GERD (gastroesophageal reflux disease)   . Kidney stone     Allergies No Known Allergies  IV Location/Drains/Wounds Patient Lines/Drains/Airways Status   Active Line/Drains/Airways    Name:   Placement date:   Placement time:   Site:   Days:   Peripheral IV 08/06/2017 Right;Anterior Forearm   07/26/2017    0640    Forearm  less than 1   Peripheral IV 07/27/2017 Right Antecubital   07/29/2017    1230    Antecubital   less than 1   Chest Tube Left Pleural 16 Fr.   07/18/2017    1035    Pleural   less than 1   Gastrostomy/Enterostomy Gastrostomy   07/06/17    1127    -   7   Pressure Injury 11/25/16 Stage I -  Intact skin with non-blanchable redness of a localized area usually over a bony prominence.   11/25/16    0630     230   Pressure Injury 04/10/17 Stage II -  Partial thickness loss of dermis presenting as a shallow open ulcer with a red, pink wound bed without slough. 3cm x 1cm area of blanchable redness with 2 small stage 2 ulcers in the mid sacral area    04/10/17    1330     94   Pressure Injury 04/10/17 Stage II -  Partial thickness loss of dermis presenting as a shallow open ulcer with a red, pink wound bed without slough. this is multiple areas of full thickness loss related to moisture associated skin damage   04/10/17    1330     94          Labs/Imaging Results for orders placed or performed during the hospital encounter of 08/07/2017 (from the past 48 hour(s))  Lipase, blood     Status: None   Collection Time: 07/19/2017  3:05 AM  Result Value Ref Range   Lipase 24 11 - 51 U/L    Comment: Performed at [facility], [place] [address], [place], [place] [address]  Comprehensive metabolic panel     Status: Abnormal   Collection Time: 07/14/2017  3:05 AM  Result Value Ref Range   Sodium 136 135 - 145 mmol/L   Potassium 3.7 3.5 - 5.1 mmol/L   Chloride 99 (L) 101 - 111 mmol/L   CO2 26 22 - 32 mmol/L   Glucose, Bld 127 (H) 65 - 99 mg/dL   BUN 13 6 - 20 mg/dL   Creatinine, Ser 0.60 (L) 0.61 - 1.24 mg/dL   Calcium 8.6 (L) 8.9 - 10.3 mg/dL   Total Protein 6.2 (L) 6.5 - 8.1 g/dL   Albumin 2.6 (L) 3.5 - 5.0 g/dL   AST 40 15 - 41 U/L   ALT 18 17 - 63 U/L   Alkaline Phosphatase 147 (H) 38 - 126 U/L   Total Bilirubin 0.5 0.3 - 1.2 mg/dL   GFR calc non Af Amer >60 >60 mL/min   GFR calc Af Amer >60 >60 mL/min    Comment: (NOTE) The eGFR has been calculated using the CKD EPI equation. This calculation has not been validated in all clinical situations. eGFR's persistently <60 mL/min signify possible Chronic Kidney Disease.    Anion gap 11 5 - 15    Comment: Performed at [facility], [place] [address], [place], [place] [address]  CBC     Status: Abnormal   Collection Time: 08/05/2017  3:05 AM  Result Value Ref Range   WBC 12.7 (H) 4.0 - 10.5 K/uL   RBC 3.51 (L) 4.22 - 5.81 MIL/uL   Hemoglobin 10.5 (L) 13.0 - 17.0 g/dL   HCT 31.3 (L) 39.0 - 52.0 %   MCV 89.2 78.0 - 100.0 fL   MCH 29.9 26.0 - 34.0 pg   MCHC  33.5 30.0 - 36.0 g/dL  RDW 15.4 11.5 - 15.5 %   Platelets 419 (H) 150 - 400 K/uL    Comment: Performed at [facility], [place] [address] [place] [address] [place], [place] [address]  Urinalysis, Routine w reflex microscopic     Status: Abnormal   Collection Time: 07/24/2017  3:05 AM  Result Value Ref Range   Color, Urine YELLOW YELLOW   APPearance CLEAR CLEAR   Specific Gravity, Urine 1.012 1.005 - 1.030   pH 6.0 5.0 - 8.0   Glucose, UA NEGATIVE NEGATIVE mg/dL   Hgb urine dipstick NEGATIVE NEGATIVE   Bilirubin Urine NEGATIVE NEGATIVE   Ketones, ur 5 (A) NEGATIVE mg/dL   Protein, ur NEGATIVE NEGATIVE mg/dL   Nitrite NEGATIVE NEGATIVE   Leukocytes, UA NEGATIVE NEGATIVE    Comment: Performed at [place] [address], [place], [place] [address]  Differential     Status: Abnormal   Collection Time: 07/29/2017  3:05 AM  Result Value Ref Range   Neutrophils Relative % 77 %   Lymphocytes Relative 15 %   Monocytes Relative 8 %   Eosinophils Relative 0 %   Basophils Relative 0 %   Neutro Abs 9.8 (H) 1.7 - 7.7 K/uL   Lymphs Abs 1.9 0.7 - 4.0 K/uL   Monocytes Absolute 1.0 0.1 - 1.0 K/uL   Eosinophils Absolute 0.0 0.0 - 0.7 K/uL   Basophils Absolute 0.0 0.0 - 0.1 K/uL   Smear Review MORPHOLOGY UNREMARKABLE     Comment: Performed at [facility], [place] [address] [place], [place] [address]  POC occult blood, ED     Status: None   Collection Time: 07/14/2017  6:07 AM  Result Value Ref Range   Fecal Occult Bld NEGATIVE NEGATIVE  Body fluid culture     Status: None (Preliminary result)   Collection Time: 07/22/2017 11:07 AM  Result Value Ref Range   Specimen Description PLEURAL LEFT    Special Requests NONE    Gram Stain      WBC PRESENT, PREDOMINANTLY PMN GRAM POSITIVE COCCI IN PAIRS GRAM NEGATIVE RODS Gram Stain Report Called to,Read Back By and Verified With: WEST,S. RN _0  ON 4.5.19 BY NMCCOY Performed at [facility], [place] [address] [place], [place] [address]    Culture PENDING    Report Status PENDING   Body fluid cell count with differential     Status: Abnormal   Collection Time: 08/06/2017 11:07 AM  Result Value Ref Range   Fluid Type-FCT PLEURAL     Comment: LEFT   Color, Fluid RED (A) YELLOW   Appearance, Fluid TURBID (A) CLEAR   WBC, Fluid UNABLE TO PERFORM COUNT DUE TO CLOT IN SPECIMEN 0 - 1,000 cu mm   Neutrophil Count, Fluid 72 (H) 0 - 25 %   Monocyte-Macrophage-Serous Fluid 28 (L) 50 - 90 %    Comment: Performed at [facility], [place] [address], [place], [place] [address]  Blood gas, arterial     Status: Abnormal   Collection Time: 07/29/2017 11:56 AM  Result Value Ref Range   FIO2 50.00    Delivery systems VENTIMASK    pH, Arterial 7.243 (L) 7.350 - 7.450   pCO2 arterial 48.1 (H) 32.0 - 48.0 mmHg   pO2, Arterial 98.8 83.0 - 108.0 mmHg   Bicarbonate 19.8 (L) 20.0 - 28.0 mmol/L   Acid-base deficit 6.5 (H) 0.0 - 2.0 mmol/L   O2 Saturation 95.3 %   Patient temperature 99.8  Collection site LEFT RADIAL    Drawn by [id number]    Sample type ARTERIAL DRAW    Allens test (pass/fail) PASS PASS    Comment: Performed at [facility], [place] [address] [place], [place] [address]   Ct Abdomen Pelvis W Contrast  Result Date: 07/24/2017 CLINICAL DATA:  Fever, weakness, abdominal pain EXAM: CT ABDOMEN AND PELVIS WITH CONTRAST TECHNIQUE: Multidetector CT imaging of the abdomen and pelvis was performed using the standard protocol following bolus administration of intravenous contrast. CONTRAST:  153m ISOVUE-300 IOPAMIDOL (ISOVUE-300) INJECTION 61% COMPARISON:  07/04/2017 FINDINGS: Lower chest: Large bilateral pleural effusions. The left pleural effusion is higher density relative to the right and does not measure water density, likely hemothorax. Heart is normal size. Large hiatal hernia. There is a left pneumothorax, moderate-sized noted at the left lung base. Airspace  opacities in both lower lobes could reflect atelectasis, or infiltrates. There is a posterior left 10th rib fracture. Hepatobiliary: Gallbladder is contracted, grossly unremarkable. Small hypodensity centrally in the right attic lobe most compatible with small cyst. Pancreas: No focal abnormality or ductal dilatation. Spleen: No focal abnormality.  Normal size. Adrenals/Urinary Tract: Simple appearing 3 cm cyst in the midpole of the right kidney. No hydronephrosis. No adrenal mass. Urinary bladder decompressed, grossly unremarkable. Stomach/Bowel: Sigmoid diverticulosis. No active diverticulitis. Large stool burden in the colon. Gastrostomy tube noted in the stomach. No evidence of bowel obstruction. Vascular/Lymphatic: Aortic atherosclerosis. No enlarged abdominal or pelvic lymph nodes.1 Reproductive: Prominent prostate with central calcifications. Other: No free fluid or free air. Musculoskeletal: Posterior left 10th rib fracture as noted above. No additional acute bony abnormality. IMPRESSION: Moderate-sized left pneumothorax and large left hemothorax. Large right pleural effusion. Posterior left 10th rib fracture. Large hiatal hernia. Colonic diverticulosis. Left colonic diverticulosis. No active diverticulitis. Large stool burden in the colon. Aortic atherosclerosis. Prostate enlargement with central calcifications. These results were called by telephone at the time of interpretation on 07/09/2017 at 9:47 am to Dr. WEulis Foster, who verbally acknowledged these results. Electronically Signed   By: KRolm BaptiseM.D.   On: 07/24/2017 09:50   Dg Chest Port 1 View  Result Date: 07/23/2017 CLINICAL DATA:  Chest tube placement for pneumothorax EXAM: PORTABLE CHEST 1 VIEW COMPARISON:  July 13, 2017 study obtained earlier in the day FINDINGS: There is now a chest tube on the left. There is currently a minimal residual left apical pneumothorax. There has been resolution of most of the effusion on the left as well. There is a  small residual left pleural effusion. There is patchy atelectasis bilaterally with a fairly small right pleural effusion. Heart is upper normal in size with pulmonary vascularity within normal limits. No adenopathy. There is aortic atherosclerosis. No bone lesions. IMPRESSION: Minimal residual pneumothorax and effusion on the left after chest tube placement. There is a small right pleural effusion. There is patchy bibasilar atelectasis. Stable cardiac silhouette. There is aortic atherosclerosis. Aortic Atherosclerosis (ICD10-I70.0). Electronically Signed   By: WLowella GripIII M.D.   On: 07/15/2017 10:54   Dg Chest Port 1 View  Result Date: 07/30/2017 CLINICAL DATA:  Shortness of Breath EXAM: PORTABLE CHEST 1 VIEW COMPARISON:  July 04, 2017 FINDINGS: There is a focal pneumothorax on the left of apical and lateral in location without appreciable tension component. There is also moderate pleural effusion on the left. There is airspace consolidation in the left mid and lower lung zones. There is patchy infiltrate and effusion in the right base. Heart size  and pulmonary vascularity are normal. No adenopathy. No bone lesions. IMPRESSION: Moderate left-sided hydropneumothorax without tension component. Airspace consolidation left lower lobe. A lesser degree of consolidation noted in right base. There is also a lesser degree of pleural effusion on the right. Stable cardiac silhouette. Electronically Signed   By: Lowella Grip III M.D.   On: 07/22/2017 09:32    Pending Labs Unresulted Labs (From admission, onward)   Start     Ordered   07/15/17 0500  Creatinine, serum  Daily,   R    Question:  Specimen collection method  Answer:  IV Team=IV Team collect   07/19/2017 1323   07/14/17 0500  Comprehensive metabolic panel  Tomorrow morning,   R     07/19/2017 1229   07/14/17 0500  CBC  Tomorrow morning,   R     07/19/2017 1229   07/10/2017 1107  Protein, pleural or peritoneal fluid  Once,   R     07/20/2017 1107    07/27/2017 1107  Glucose, pleural or peritoneal fluid  Once,   R     07/10/2017 1107   07/15/2017 0847  Culture, blood (routine x 2)  BLOOD CULTURE X 2,   STAT     07/18/2017 0847      Vitals/Pain Today's Vitals   07/24/2017 1230 08/06/2017 1245 08/04/2017 1323 07/24/2017 1327  BP: 109/72 109/81 97/75   Pulse: (!) 134 (!) 133 (!) 137   Resp: (!) 42 (!) 34 (!) 36   Temp:      TempSrc:      SpO2: 100% 100% 100%   PainSc:    9     Isolation Precautions No active isolations  Medications Medications  iopamidol (ISOVUE-300) 61 % injection (has no administration in time range)  iopamidol (ISOVUE-300) 61 % injection (has no administration in time range)  lidocaine (PF) (XYLOCAINE) 1 % injection (has no administration in time range)  0.9 %  sodium chloride infusion (has no administration in time range)  morphine 2 MG/ML injection 2 mg (2 mg Intravenous Given 07/21/2017 1254)  LORazepam (ATIVAN) injection 1 mg (has no administration in time range)  ondansetron (ZOFRAN) injection 4 mg (has no administration in time range)  pantoprazole (PROTONIX) injection 40 mg (has no administration in time range)  vancomycin (VANCOCIN) IVPB 750 mg/150 ml premix (has no administration in time range)  piperacillin-tazobactam (ZOSYN) IVPB 3.375 g (has no administration in time range)  ondansetron (ZOFRAN) injection 4 mg (4 mg Intravenous Given 07/23/2017 0310)  ondansetron (ZOFRAN) injection 4 mg (4 mg Intravenous Given 07/16/2017 0619)  fentaNYL (SUBLIMAZE) injection 50 mcg (50 mcg Intravenous Given 08/04/2017 0619)  sodium chloride 0.9 % bolus 1,000 mL (0 mLs Intravenous Stopped 07/30/2017 0833)  iopamidol (ISOVUE-300) 61 % injection 30 mL (30 mLs Oral Contrast Given 07/19/2017 0642)  sodium chloride 0.9 % bolus 1,000 mL (0 mLs Intravenous Stopped 08/05/2017 1000)  HYDROmorphone (DILAUDID) injection 1 mg (1 mg Intravenous Given 07/29/2017 0850)  iopamidol (ISOVUE-300) 61 % injection 100 mL (100 mLs Intravenous Contrast Given 07/28/2017 0900)   lidocaine (PF) (XYLOCAINE) 1 % injection (2 mLs Intradermal Given 08/06/2017 1031)  0.9 %  sodium chloride infusion ( Intravenous Stopped 07/16/2017 1257)  piperacillin-tazobactam (ZOSYN) IVPB 3.375 g (0 g Intravenous Stopped 07/20/2017 1208)  vancomycin (VANCOCIN) IVPB 1000 mg/200 mL premix (0 mg Intravenous Stopped 07/10/2017 1327)    Mobility Bed rest

## 2017-07-13 NOTE — Sedation Documentation (Signed)
Patient tolerating procedure well. First cut made at 1032. Patient hyptoensive. 2L bolus of NS IV verbally ordered by Dr Eulis Foster. Administered per order.

## 2017-07-13 NOTE — Sedation Documentation (Signed)
Procedure complete. Dressing placed to site by Dr Eulis Foster. 1336mL immediate dark brown output into chest tube. Patient tolerated well. Chest xray to verify placement.

## 2017-07-13 NOTE — ED Notes (Addendum)
Pt c/o generalized body aches and weakness x "months."  Pain score 10/10.  Pt reports recently taking Morphine and drinking ETOH.  Sts new abdominal pain and nausea.  Pt was discharged from IP on 4/2.  Sts he lives alone and does not qualify for home health.   Pt noted to be saturated w/ urine and dried stool.  Pt cleaned up by ED staff.

## 2017-07-13 NOTE — Progress Notes (Signed)
RN requested PT be place on 50% Venturimask- done. Current Sp02 93%.

## 2017-07-13 NOTE — ED Notes (Signed)
Called ICU, both Richrd Sox and Alyse Low unable to take report. ICU to call back.

## 2017-07-13 NOTE — ED Triage Notes (Signed)
Patient arrives by [facility] with complaints of fever and weakness-temp 101.7 per EMS. Patient lives by himself. Patient seen earlier for weakness.

## 2017-07-13 NOTE — Progress Notes (Addendum)
Pharmacy Antibiotic Note  Patrick [name] is a 67 y.o. male with hx severe esophageal stricture, EtOH abuse, and failure to thrive with PEG tube placed on 07/06/17 with last admission.  He was discharged on 07/10/17 and presented back to the ED from home on 07/30/2017 with c/o fever, weakness, abdominal pain and nausea.  He developed SOB while in the ED and was found to have moderate left-sided hydropneumothorax, large left hemothorax and left 10th rib fracture on CXR and chest CT. Chest tube placed on 4/5.  To start vancomycin and zosyn for suspected asp PNA from esophageal stricture and esophageal tear with infected pleural space.   - scr 0.60 (crcl~59)    Plan: - vancomycin 1000 mg IV x1 given in the ED, then 750 mg IV q24h for est AUC 478 (goal 400-500) - zosyn 3.375 gm IV q8h (infuse over 4 hrs) - daily scr   ___________________________________  Temp (24hrs), Avg:99.8 F (37.7 C), Min:99.8 F (37.7 C), Max:99.8 F (37.7 C)  Recent Labs  Lab 07/07/17 0458 07/08/17 0445 07/09/17 0514 07/10/17 0536 07/12/2017 0305  WBC  --   --   --   --  12.7*  CREATININE 0.48* 0.51* 0.46* 0.50* 0.60*    Estimated Creatinine Clearance: 59.5 mL/min (A) (by C-G formula based on SCr of 0.6 mg/dL (L)).    No Known Allergies   Thank you for allowing pharmacy to be a part of this patient's care.  Lynelle Doctor 07/26/2017 1:03 PM

## 2017-07-13 NOTE — ED Notes (Signed)
Pt getting increasingly agitated and continuing to ask for pain medication.  Sts Zofran did not help.

## 2017-07-13 NOTE — ED Notes (Signed)
Pt found sitting on edge of bed having disconnected cardiac monitor.  Blood tinged sputum noted on blankets, in emesis bag, and on the floor.  Pt admits to putting his fingers down his throat to induce vomiting.  Pt specifically asking for morphine and something to drink.  Education provided.  Pt not in mindset to absorb education.  New warm blankets x 2 provided and floor cleaned/mopped.

## 2017-07-13 NOTE — ED Notes (Signed)
Pt c/o increasing abdominal pain and nausea.  Pt noted to be sticking fingers down throat to induce vomiting.  Pt redirected and ordered protocol Zofran.  Hx of esophageal varicies.   Pt reports he has not had anything to eat or drink, except ETOH, since IP d/c on 4/2.  Sts G tube placed during last admission.  Sts "someone was supposed to come out and show me how to use this tube, but I didn't have the medicine."

## 2017-07-13 NOTE — ED Notes (Signed)
Pt. Given urinal. 

## 2017-07-13 NOTE — ED Provider Notes (Signed)
0835-asked to see patient because of pain.  Currently patient having abdominal pain.  Is also been placed on oxygen because saturations were 88% on room air.  He does not usually use oxygen.  He states he was discharged on "morphine," but did not take it.  He was seen earlier and a CT was ordered by Dr. Roxanne Mins.  Patient states he wants to go home after the CT is done.  Exam at 0835-alert, calm, dyspneic, rapid respiratory rate, rapid heart rate, uncomfortable.  Heart tachycardic without murmur.  Lungs with diffuse wheezing, and decreased air movement bilaterally.  Moderate increased work of breathing.  Abdomen has hypoactive bowel sounds.  Abdomen is firm, and diffusely tender.  Atrial device left upper quadrant, site and appliance appear fairly normal although the bandage on the base of the appliance is dirty.  Groin masses bilaterally, easily reduced and are the testicles that go into the scrotum with light pressure.  Penis and genital exam is otherwise normal.  09: 5 5-discussed CT images, with radiology, Dr. Ardeen Garland.  Previously discussed chest x-ray results with Dr. Jasmine December Dr. Jasmine December.     Clinical Course as of Jul 14 1218  Fri Jul 13, 2017  6301 CT ABDOMEN PELVIS W CONTRAST [EW]  1117 RBC(!): 3.51 [EW]  1120 Discussed with general surgery, who will evaluate the patient and be involved with care.   [EW]  1218 Critical care is now here seeing the patient, and will admit.  I have discussed palliative care measures with the patient was agreeable.  CT chest previously ordered canceled, before it was done.   [EW]  6010 PH low 7.24, PCO2 elevated 48, PO2 normal at 98, bicarb 20.  Blood gas, arterial(!) [EW]  1219 Normal except chloride low 99, glucose high 127, creatinine low 0.6, total protein low 6.2, albumin low 2.6.  Comprehensive metabolic panel(!) [EW]    Clinical Course User Index [EW] Daleen Bo, MD   Consultations by me by telephone include: cardiovascular thoracic surgery.   Pulmonary critical care.  General surgery.  Radiology.   CHEST TUBE INSERTION Date/Time: 07/14/2017 11:07 AM Performed by: Daleen Bo, MD Authorized by: Daleen Bo, MD   Consent:    Consent obtained:  Written   Consent given by:  Patient   Risks discussed:  Bleeding, incomplete drainage, damage to surrounding structures, infection and pain   Alternatives discussed:  No treatment Pre-procedure details:    Skin preparation:  ChloraPrep Anesthesia (see MAR for exact dosages):    Anesthesia method:  Local infiltration   Local anesthetic:  Lidocaine 1% w/o epi Procedure details:    Placement location:  L anterior   Scalpel size:  11   Tube size (Fr):  16   Ultrasound guidance: no     Tension pneumothorax: no     Tube connected to:  Water seal and suction   Suture material: 3-0 Nylon.   Dressing: Tegaderm. Post-procedure details:    Post-insertion x-ray findings: tube in good position     Patient tolerance of procedure:  Tolerated well, no immediate complications Comments:     Tube output, 1350 cc of brown fluid without visible blood. .Critical Care Performed by: Daleen Bo, MD Authorized by: Daleen Bo, MD   Critical care provider statement:    Critical care time (minutes):  50   Critical care start time:  07/16/2017 8:30 AM   Critical care end time:  07/29/2017 11:20 AM   Critical care time was exclusive of:  Separately billable procedures and  treating other patients   Critical care was necessary to treat or prevent imminent or life-threatening deterioration of the following conditions:  Sepsis   Critical care was time spent personally by me on the following activities:  Blood draw for specimens, development of treatment plan with patient or surrogate, discussions with consultants, evaluation of patient's response to treatment, examination of patient, obtaining history from patient or surrogate, ordering and performing treatments and interventions, ordering and review  of laboratory studies, pulse oximetry, re-evaluation of patient's condition, review of old charts and ordering and review of radiographic studies     Patient Vitals for the past 24 hrs:  BP Temp Temp src Pulse Resp SpO2  08/03/2017 1200 105/71 - - (!) 131 (!) 45 100 %  07/23/2017 1145 96/72 - - (!) 132 (!) 41 100 %  07/23/2017 1115 96/70 - - (!) 129 (!) 44 96 %  07/14/2017 1112 94/66 - - (!) 129 (!) 36 100 %  07/31/2017 1109 94/66 - - (!) 130 (!) 41 100 %  07/23/2017 1106 91/62 - - (!) 131 (!) 44 100 %  07/14/2017 1103 91/66 - - - (!) 39 -  07/30/2017 1100 (!) 89/67 - - (!) 131 (!) 41 100 %  07/19/2017 1057 (!) 88/67 - - - (!) 38 -  07/15/2017 1054 96/70 - - (!) 132 (!) 35 98 %  07/09/2017 1051 (!) 87/60 - - - (!) 46 -  07/15/2017 1048 (!) 86/55 - - - (!) 41 -  07/29/2017 1045 90/61 - - - (!) 32 -  08/03/2017 1042 92/61 - - - (!) 42 -  07/16/2017 1039 (!) 83/58 - - - (!) 38 -  08/05/2017 1037 (!) 84/55 - - (!) 140 (!) 41 96 %  07/11/2017 1036 (!) 88/60 - - (!) 141 (!) 42 97 %  07/10/2017 1033 (!) 81/62 - - (!) 140 (!) 34 97 %  07/10/2017 1030 (!) 76/56 - - - (!) 40 -  07/24/2017 0900 - - - - - 93 %  08/05/2017 0830 93/67 - - (!) 142 (!) 33 99 %  07/22/2017 0730 106/74 - - (!) 143 (!) 39 (!) 82 %  07/10/2017 0700 96/63 - - (!) 145 (!) 33 (!) 88 %  07/16/2017 0400 119/79 - - (!) 122 (!) 30 97 %  08/07/2017 0330 101/64 - - (!) 119 20 94 %  07/11/2017 0300 102/70 - - (!) 125 (!) 23 100 %  07/18/2017 0230 (!) 132/93 - - (!) 130 17 99 %  07/12/2017 0224 - 99.8 F (37.7 C) Rectal - - -  07/12/2017 0210 (!) 126/91 - - (!) 132 16 96 %  07/30/2017 0146 - - - - - 94 %   Ct Abdomen Pelvis W Contrast  Result Date: 07/16/2017 CLINICAL DATA:  Fever, weakness, abdominal pain EXAM: CT ABDOMEN AND PELVIS WITH CONTRAST TECHNIQUE: Multidetector CT imaging of the abdomen and pelvis was performed using the standard protocol following bolus administration of intravenous contrast. CONTRAST:  138mL ISOVUE-300 IOPAMIDOL (ISOVUE-300) INJECTION 61% COMPARISON:   07/04/2017 FINDINGS: Lower chest: Large bilateral pleural effusions. The left pleural effusion is higher density relative to the right and does not measure water density, likely hemothorax. Heart is normal size. Large hiatal hernia. There is a left pneumothorax, moderate-sized noted at the left lung base. Airspace opacities in both lower lobes could reflect atelectasis, or infiltrates. There is a posterior left 10th rib fracture. Hepatobiliary: Gallbladder is contracted, grossly unremarkable. Small hypodensity centrally in the  right attic lobe most compatible with small cyst. Pancreas: No focal abnormality or ductal dilatation. Spleen: No focal abnormality.  Normal size. Adrenals/Urinary Tract: Simple appearing 3 cm cyst in the midpole of the right kidney. No hydronephrosis. No adrenal mass. Urinary bladder decompressed, grossly unremarkable. Stomach/Bowel: Sigmoid diverticulosis. No active diverticulitis. Large stool burden in the colon. Gastrostomy tube noted in the stomach. No evidence of bowel obstruction. Vascular/Lymphatic: Aortic atherosclerosis. No enlarged abdominal or pelvic lymph nodes.1 Reproductive: Prominent prostate with central calcifications. Other: No free fluid or free air. Musculoskeletal: Posterior left 10th rib fracture as noted above. No additional acute bony abnormality. IMPRESSION: Moderate-sized left pneumothorax and large left hemothorax. Large right pleural effusion. Posterior left 10th rib fracture. Large hiatal hernia. Colonic diverticulosis. Left colonic diverticulosis. No active diverticulitis. Large stool burden in the colon. Aortic atherosclerosis. Prostate enlargement with central calcifications. These results were called by telephone at the time of interpretation on 07/27/2017 at 9:47 am to Dr. Eulis Foster , who verbally acknowledged these results. Electronically Signed   By: Rolm Baptise M.D.   On: 07/15/2017 09:50   Dg Chest Port 1 View  Result Date: 08/05/2017 CLINICAL DATA:  Chest  tube placement for pneumothorax EXAM: PORTABLE CHEST 1 VIEW COMPARISON:  July 13, 2017 study obtained earlier in the day FINDINGS: There is now a chest tube on the left. There is currently a minimal residual left apical pneumothorax. There has been resolution of most of the effusion on the left as well. There is a small residual left pleural effusion. There is patchy atelectasis bilaterally with a fairly small right pleural effusion. Heart is upper normal in size with pulmonary vascularity within normal limits. No adenopathy. There is aortic atherosclerosis. No bone lesions. IMPRESSION: Minimal residual pneumothorax and effusion on the left after chest tube placement. There is a small right pleural effusion. There is patchy bibasilar atelectasis. Stable cardiac silhouette. There is aortic atherosclerosis. Aortic Atherosclerosis (ICD10-I70.0). Electronically Signed   By: Lowella Grip III M.D.   On: 07/12/2017 10:54   Dg Chest Port 1 View  Result Date: 07/19/2017 CLINICAL DATA:  Shortness of Breath EXAM: PORTABLE CHEST 1 VIEW COMPARISON:  July 04, 2017 FINDINGS: There is a focal pneumothorax on the left of apical and lateral in location without appreciable tension component. There is also moderate pleural effusion on the left. There is airspace consolidation in the left mid and lower lung zones. There is patchy infiltrate and effusion in the right base. Heart size and pulmonary vascularity are normal. No adenopathy. No bone lesions. IMPRESSION: Moderate left-sided hydropneumothorax without tension component. Airspace consolidation left lower lobe. A lesser degree of consolidation noted in right base. There is also a lesser degree of pleural effusion on the right. Stable cardiac silhouette. Electronically Signed   By: Lowella Grip III M.D.   On: 07/12/2017 09:32    Results for orders placed or performed during the hospital encounter of 07/18/2017  Lipase, blood  Result Value Ref Range   Lipase 24 11 -  51 U/L  Comprehensive metabolic panel  Result Value Ref Range   Sodium 136 135 - 145 mmol/L   Potassium 3.7 3.5 - 5.1 mmol/L   Chloride 99 (L) 101 - 111 mmol/L   CO2 26 22 - 32 mmol/L   Glucose, Bld 127 (H) 65 - 99 mg/dL   BUN 13 6 - 20 mg/dL   Creatinine, Ser 0.60 (L) 0.61 - 1.24 mg/dL   Calcium 8.6 (L) 8.9 - 10.3 mg/dL   Total  Protein 6.2 (L) 6.5 - 8.1 g/dL   Albumin 2.6 (L) 3.5 - 5.0 g/dL   AST 40 15 - 41 U/L   ALT 18 17 - 63 U/L   Alkaline Phosphatase 147 (H) 38 - 126 U/L   Total Bilirubin 0.5 0.3 - 1.2 mg/dL   GFR calc non Af Amer >60 >60 mL/min   GFR calc Af Amer >60 >60 mL/min   Anion gap 11 5 - 15  CBC  Result Value Ref Range   WBC 12.7 (H) 4.0 - 10.5 K/uL   RBC 3.51 (L) 4.22 - 5.81 MIL/uL   Hemoglobin 10.5 (L) 13.0 - 17.0 g/dL   HCT 31.3 (L) 39.0 - 52.0 %   MCV 89.2 78.0 - 100.0 fL   MCH 29.9 26.0 - 34.0 pg   MCHC 33.5 30.0 - 36.0 g/dL   RDW 15.4 11.5 - 15.5 %   Platelets 419 (H) 150 - 400 K/uL  Urinalysis, Routine w reflex microscopic  Result Value Ref Range   Color, Urine YELLOW YELLOW   APPearance CLEAR CLEAR   Specific Gravity, Urine 1.012 1.005 - 1.030   pH 6.0 5.0 - 8.0   Glucose, UA NEGATIVE NEGATIVE mg/dL   Hgb urine dipstick NEGATIVE NEGATIVE   Bilirubin Urine NEGATIVE NEGATIVE   Ketones, ur 5 (A) NEGATIVE mg/dL   Protein, ur NEGATIVE NEGATIVE mg/dL   Nitrite NEGATIVE NEGATIVE   Leukocytes, UA NEGATIVE NEGATIVE  Differential  Result Value Ref Range   Neutrophils Relative % 77 %   Lymphocytes Relative 15 %   Monocytes Relative 8 %   Eosinophils Relative 0 %   Basophils Relative 0 %   Neutro Abs 9.8 (H) 1.7 - 7.7 K/uL   Lymphs Abs 1.9 0.7 - 4.0 K/uL   Monocytes Absolute 1.0 0.1 - 1.0 K/uL   Eosinophils Absolute 0.0 0.0 - 0.7 K/uL   Basophils Absolute 0.0 0.0 - 0.1 K/uL   Smear Review MORPHOLOGY UNREMARKABLE   Blood gas, arterial  Result Value Ref Range   FIO2 50.00    Delivery systems VENTIMASK    pH, Arterial 7.243 (L) 7.350 - 7.450    pCO2 arterial 48.1 (H) 32.0 - 48.0 mmHg   pO2, Arterial 98.8 83.0 - 108.0 mmHg   Bicarbonate 19.8 (L) 20.0 - 28.0 mmol/L   Acid-base deficit 6.5 (H) 0.0 - 2.0 mmol/L   O2 Saturation 95.3 %   Patient temperature 99.8    Collection site LEFT RADIAL    Drawn by [id number]    Sample type ARTERIAL DRAW    Allens test (pass/fail) PASS PASS  POC occult blood, ED  Result Value Ref Range   Fecal Occult Bld NEGATIVE NEGATIVE     12:22 PM Reevaluation with update and discussion. After initial assessment and treatment, an updated evaluation reveals he is more comfortable now with blood pressure greater than 854 systolic.  He is discussing his situation with the critical care team. Rosana Hoes, MD 07/14/2017 [phone]

## 2017-07-13 NOTE — ED Notes (Signed)
Patient in CT, phlebotomy to come back when patient returns to draw labs

## 2017-07-13 NOTE — ED Provider Notes (Signed)
[facility] DEPT Provider Note   CSN: [id number] Arrival date & time: 07/24/2017  0143     History   Chief Complaint Chief Complaint  Patient presents with  . Weakness  . Abdominal Pain  . Nausea    HPI Patrick [name] is a 67 y.o. male.  The history is provided by the patient.  He has history of diverticulitis, failure to thrive, prolonged QT interval, DNR and comes in because of generalized abdominal pain for the last several hours.  He has vomited several times and states that he vomited blood.  Pain is severe, but he cannot put a number on it.  He denies fever or chills.  Past Medical History:  Diagnosis Date  . Colon polyps   . Depression   . Diverticulitis   . Diverticulosis of colon   . GERD (gastroesophageal reflux disease)   . Kidney stone     Patient Active Problem List   Diagnosis Date Noted  . Left lower lobe pneumonia ([place]) 07/03/2017  . Prolonged QT interval 07/03/2017  . Odynophagia   . Esophageal stricture   . Acute encephalopathy 06/05/2017  . Hypercalcemia 06/05/2017  . Weight loss 06/05/2017  . Thrombocytosis ([place]) 06/05/2017  . Encephalopathy acute 06/05/2017  . Encephalopathy 06/05/2017  . Failure to thrive in adult 04/20/2017  . Adjustment disorder with depressed mood 04/15/2017  . Protein-calorie malnutrition, severe 04/12/2017  . Palliative care by specialist   . DNR (do not resuscitate)   . GI bleed 04/10/2017  . FTT (failure to thrive) in adult 04/09/2017  . Weakness   . Alcohol withdrawal ([place]) 01/04/2017  . Esophagitis 01/04/2017  . Decubitus ulcer of buttock, stage 1   . Upper GI bleed 12/20/2016  . AKI (acute kidney injury) ([place]) 12/20/2016  . 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 Encounter for palliative care   . Goals of care, counseling/discussion   . Dysphagia 11/30/2016  . Anemia 11/25/2016  . Pressure ulcer of sacral region, stage 2 11/25/2016  . Alcohol abuse 11/25/2016  .  Hypotension 11/25/2016  . Hypomagnesemia 11/25/2016  . Hyponatremia 11/25/2016  . Non-traumatic rhabdomyolysis 11/25/2016  . Erosive esophagitis 05/14/2015  . Hypokalemia 04/30/2015  . Thrombocytopenia ([place]) 04/16/2015  . Alcohol dependence ([place]) 03/17/2015  . Change in bowel habits 02/23/2015  . Rectal bleeding 02/23/2015  . Constipation 02/23/2015  . Transaminasemia 03/27/2013  . Essential tremor 03/27/2013  . Hypertension 07/19/2012  . TOBACCO USE 03/01/2010  . DIVERTICULOSIS, COLON 05/14/2007  . DEPRESSION 11/05/2006  . COLONIC POLYPS, HX OF 11/05/2006    Past Surgical History:  Procedure Laterality Date  . COLONOSCOPY    . ESOPHAGOGASTRODUODENOSCOPY N/A 06/06/2017   Procedure: ESOPHAGOGASTRODUODENOSCOPY (EGD);  Surgeon: Jerene Bears, MD;  Location: [facility];  Service: Gastroenterology;  Laterality: N/A;  . ESOPHAGOGASTRODUODENOSCOPY (EGD) WITH PROPOFOL N/A 12/21/2016   Procedure: ESOPHAGOGASTRODUODENOSCOPY (EGD) WITH PROPOFOL;  Surgeon: Doran Stabler, MD;  Location: [facility] END[facility]COPY;  Service: Gastroenterology;  Laterality: N/A;  . IR GASTROSTOMY TUBE MOD SED  07/06/2017  . UPPER GASTROINTESTINAL ENDOSCOPY          Home Medications    Prior to Admission medications   Medication Sig Start Date End Date Taking? Authorizing Provider  albuterol (VENTOLIN HFA) 108 (90 BASE) MCG/ACT inhaler Inhale 2 puffs into the lungs every 4 (four) hours as needed for wheezing or shortness of breath. 08/17/14  Yes Burchette, Alinda Sierras, MD  magnesium oxide (MAG-OX) 400 (241.3 Mg) MG tablet Take  1 tablet (400 mg total) by mouth daily. 04/28/17  Yes Theodis Blaze, MD  morphine (MSIR) 15 MG tablet Take 1 tablet (15 mg total) by mouth every 6 (six) hours as needed for severe pain. 07/10/17  Yes Dessa Phi, DO  Nutritional Supplements (FEEDING SUPPLEMENT, OSMOLITE 1.5 CAL,) LIQD 6cans Osmolite 1.5/day with 100 mL free water with each TF bolus. Recommend the following regimen: 8 AM: 2 cans  Osmolite 1.5 with 100 mL free water 12 PM: 1 can Osmolite 1.5 with 100 mL free water 4 PM: 2 cans Osmolite 1.5 with 100 mL free water 8 PM: 1 can Osmolite 1.5 with 100 mL free water 07/10/17  Yes Dessa Phi, DO  pantoprazole sodium (PROTONIX) 40 mg/20 mL PACK Place 20 mLs (40 mg total) into feeding tube 2 (two) times daily. 07/10/17  Yes Dessa Phi, DO  sucralfate (CARAFATE) 1 GM/10ML suspension Place 10 mLs (1 g total) into feeding tube 4 (four) times daily. 07/10/17  Yes Dessa Phi, DO  traZODone (DESYREL) 50 MG tablet Take 1 tablet (50 mg total) by mouth at bedtime. 05/14/17  Yes Burchette, Alinda Sierras, MD  nicotine (NICODERM CQ - DOSED IN MG/24 HOURS) 21 mg/24hr patch Place 1 patch (21 mg total) onto the skin daily. Patient not taking: Reported on 07/12/2017 12/02/16   Janece Canterbury, MD  thiamine 100 MG tablet Take 1 tablet (100 mg total) by mouth daily. Patient not taking: Reported on 06/27/2017 12/02/16   Janece Canterbury, MD    Family History Family History  Problem Relation Age of Onset  . Heart disease Father 51       cabg    Social History Social History   Tobacco Use  . Smoking status: Current Every Day Smoker    Packs/day: 1.00    Types: Cigarettes  . Smokeless tobacco: Never Used  Substance Use Topics  . Alcohol use: Yes    Alcohol/week: 8.4 oz    Types: 14 Glasses of wine per week  . Drug use: No     Allergies   Patient has no known allergies.   Review of Systems Review of Systems  All other systems reviewed and are negative.    Physical Exam Updated Vital Signs BP 119/79   Pulse (!) 122   Temp 99.8 F (37.7 C) (Rectal)   Resp (!) 30   SpO2 97%   Physical Exam  Nursing note and vitals reviewed.  Cachectic 67 year old significant for tachycardia and tachypnea male, resting comfortably and in no acute distress. Vital signs are 97. Oxygen saturation is 97%, which is normal. Head is normocephalic and atraumatic. PERRLA, EOMI. Oropharynx is  clear. Neck is nontender and supple without adenopathy or JVD. Back is nontender and there is no CVA tenderness. Lungs are clear without rales, wheezes, or rhonchi. Chest is nontender. Heart has regular rate and rhythm without murmur. Abdomen is soft, flat, with moderate tenderness in the right side of the abdomen.  There is no rebound or guarding.  There are no masses or hepatosplenomegaly and peristalsis is hypoactive.  Feeding tube is present in the left upper abdomen. Rectal: Decreased sphincter tone.  No stool present in the rectal ampulla. Extremities have no cyanosis or edema, full range of motion is present. Skin is warm and dry without rash. Neurologic: He is generally anxious, but mental status is otherwise normal, cranial nerves are intact, there are no motor or sensory deficits.  ED Treatments / Results  Labs (all labs ordered are listed,  but only abnormal results are displayed) Labs Reviewed  COMPREHENSIVE METABOLIC PANEL - Abnormal; Notable for the following components:      Result Value   Chloride 99 (*)    Glucose, Bld 127 (*)    Creatinine, Ser 0.60 (*)    Calcium 8.6 (*)    Total Protein 6.2 (*)    Albumin 2.6 (*)    Alkaline Phosphatase 147 (*)    All other components within normal limits  CBC - Abnormal; Notable for the following components:   WBC 12.7 (*)    RBC 3.51 (*)    Hemoglobin 10.5 (*)    HCT 31.3 (*)    Platelets 419 (*)    All other components within normal limits  URINALYSIS, ROUTINE W REFLEX MICROSCOPIC - Abnormal; Notable for the following components:   Ketones, ur 5 (*)    All other components within normal limits  DIFFERENTIAL - Abnormal; Notable for the following components:   Neutro Abs 9.8 (*)    All other components within normal limits  LIPASE, BLOOD  POC OCCULT BLOOD, ED  I-STAT CG4 LACTIC ACID, ED  I-STAT CG4 LACTIC ACID, ED    EKG EKG Interpretation  Date/Time:  Friday July 13 2017 02:11:32 EDT Ventricular Rate:  129 PR  Interval:    QRS Duration: 75 QT Interval:  315 QTC Calculation: 462 R Axis:   92 Text Interpretation:  Sinus tachycardia Right axis deviation Minimal ST elevation, inferior leads When compared with ECG of 07/03/2017, QT has shortened Confirmed by Delora Fuel ([id number]) on 07/21/2017 2:33:33 AM   Radiology No results found.  Procedures Procedures (including critical care time)  Medications Ordered in ED Medications  iopamidol (ISOVUE-300) 61 % injection (has no administration in time range)  sodium chloride 0.9 % bolus 1,000 mL (has no administration in time range)  ondansetron (ZOFRAN) injection 4 mg (4 mg Intravenous Given 08/05/2017 0310)  ondansetron (ZOFRAN) injection 4 mg (4 mg Intravenous Given 07/24/2017 0619)  fentaNYL (SUBLIMAZE) injection 50 mcg (50 mcg Intravenous Given 07/09/2017 0619)  sodium chloride 0.9 % bolus 1,000 mL (1,000 mLs Intravenous New Bag/Given 08/06/2017 0639)  iopamidol (ISOVUE-300) 61 % injection 30 mL (30 mLs Oral Contrast Given 07/31/2017 6314)     Initial Impression / Assessment and Plan / ED Course  I have reviewed the triage vital signs and the nursing notes.  Pertinent labs & imaging results that were available during my care of the patient were reviewed by me and considered in my medical decision making (see chart for details).  Abdominal pain of uncertain cause.  He will be sent for CT of abdomen and pelvis.  Labs show anemia which is actually improved over baseline.  Mild leukocytosis is present with differential pending.  Thrombocytosis is noted and is felt to be an acute phase reactant.  Chemistry panel is significant only for hypoalbuminemia.  He is being given IV fluids, ondansetron, fentanyl.  He continues to be tachycardic after normal saline.  He is given additional fluids.  CT is pending.  Case is signed out to Dr. Eulis Foster.  Final Clinical Impressions(s) / ED Diagnoses   Final diagnoses:  Abdominal pain, unspecified abdominal location    ED Discharge  Orders    None       Delora Fuel, MD 97/02/63 [phone]

## 2017-07-13 NOTE — Consult Note (Addendum)
PULMONARY / CRITICAL CARE MEDICINE   Name: Patrick [name] MRN: [id number] DOB: [birth year]    ADMISSION DATE:  07/20/2017 CONSULTATION DATE:  4/5  REFERRING MD:  Eulis Foster   CHIEF COMPLAINT:  Pneumothorax   HISTORY OF PRESENT ILLNESS:   67 year old male w/ multiple co-morbids specifically: esophageal stricture, erosive esophagitis, dysphagia w/ aspiration, prior ETOH abuse and failure to thrive w/ what he describes as a 50 lb wt loss in last 5 months. Recently had G-tube placed back on 3/29 but this has not been used yet d/t home health scheduling. Just d/cd 4/2.  Presents to ED on 4/5 w/ cc: rib cage hurting and stomach pain (onset the night prior). He was significantly short of breath w/ room air pulse ox at 88%. CT chest was obtained and this demonstrated: large bilateral pleural effusions w/ also left PTX. Also bilateral airspace disease AND posterior 10th rib fracture. A thoracostomy tube was placed by the EDP. Surgical services were consulted and as he was also hypotensive PCCM was called to admit   PAST MEDICAL HISTORY :  He  has a past medical history of Colon polyps, Depression, Diverticulitis, Diverticulosis of colon, GERD (gastroesophageal reflux disease), and Kidney stone.  PAST SURGICAL HISTORY: He  has a past surgical history that includes Colonoscopy; Upper gastrointestinal endoscopy; Esophagogastroduodenoscopy (egd) with propofol (N/A, 12/21/2016); Esophagogastroduodenoscopy (N/A, 06/06/2017); and IR GASTROSTOMY TUBE MOD SED (07/06/2017).  No Known Allergies  No current facility-administered medications on file prior to encounter.    Current Outpatient Medications on File Prior to Encounter  Medication Sig  . albuterol (VENTOLIN HFA) 108 (90 BASE) MCG/ACT inhaler Inhale 2 puffs into the lungs every 4 (four) hours as needed for wheezing or shortness of breath.  . magnesium oxide (MAG-OX) 400 (241.3 Mg) MG tablet Take 1 tablet (400 mg total) by mouth daily.  Marland Kitchen morphine (MSIR) 15 MG  tablet Take 1 tablet (15 mg total) by mouth every 6 (six) hours as needed for severe pain.  . Nutritional Supplements (FEEDING SUPPLEMENT, OSMOLITE 1.5 CAL,) LIQD 6cans Osmolite 1.5/day with 100 mL free water with each TF bolus. Recommend the following regimen: 8 AM: 2 cans Osmolite 1.5 with 100 mL free water 12 PM: 1 can Osmolite 1.5 with 100 mL free water 4 PM: 2 cans Osmolite 1.5 with 100 mL free water 8 PM: 1 can Osmolite 1.5 with 100 mL free water  . pantoprazole sodium (PROTONIX) 40 mg/20 mL PACK Place 20 mLs (40 mg total) into feeding tube 2 (two) times daily.  . sucralfate (CARAFATE) 1 GM/10ML suspension Place 10 mLs (1 g total) into feeding tube 4 (four) times daily.  . traZODone (DESYREL) 50 MG tablet Take 1 tablet (50 mg total) by mouth at bedtime.  . thiamine 100 MG tablet Take 1 tablet (100 mg total) by mouth daily. (Patient not taking: Reported on 06/27/2017)    FAMILY HISTORY:  His indicated that his mother is deceased. He indicated that his father is deceased.   SOCIAL HISTORY: He  reports that he has been smoking cigarettes.  He has been smoking about 1.00 pack per day. He has never used smokeless tobacco. He reports that he drinks about 8.4 oz of alcohol per week. He reports that he does not use drugs.  REVIEW OF SYSTEMS:   Gen: generalized weakness. Poor po intake. Wt loss HENT: (-). Pulm: diffuse chest wall/"rib pain", + short of breath, (-) cough. Card: (+) hypotension. CP as noted. Abd (+) abd pain neuro no  issues  SUBJECTIVE:  Marked pain   VITAL SIGNS: Blood Pressure 105/71   Pulse (Abnormal) 131   Temperature 99.8 F (37.7 C) (Rectal)   Respiration (Abnormal) 45   Oxygen Saturation 100%   HEMODYNAMICS:    VENTILATOR SETTINGS: FiO2 (%):  [50 %] 50 %  INTAKE / OUTPUT: No intake/output data recorded.  PHYSICAL EXAMINATION: General: Frail 67 year old male patient currently in marked discomfort due to generalized rib pain and recent chest tube  placement Neuro: Awake alert no focal deficits HEENT: Temporal wasting mucous membranes dry no jugular venous distention Cardiovascular: Tachycardic regular rate and rhythm Lungs: Scattered rhonchi, left pleural rub.  Left chest tube at 20 cm wall water suction.  No air leak currently, bilious brown appearing pleural fluid Abdomen: Tender.  PEG tube unremarkable.  Hypoactive bowel sounds Musculoskeletal: Generalized weakness poor muscle bulk Skin: Warm and dry  LABS:  BMET Recent Labs  Lab 07/09/17 0514 07/10/17 0536 07/26/2017 0305  NA 127* 129* 136  K 3.8 3.6 3.7  CL 95* 97* 99*  CO2 26 28 26   BUN 8 9 13   CREATININE 0.46* 0.50* 0.60*  GLUCOSE 111* 105* 127*    Electrolytes Recent Labs  Lab 07/08/17 0445 07/09/17 0514 07/10/17 0536 07/16/2017 0305  CALCIUM 8.5* 8.4* 8.4* 8.6*  MG 1.6* 1.5* 2.0  --   PHOS 1.8* 1.9* 3.2  --     CBC Recent Labs  Lab 07/31/2017 0305  WBC 12.7*  HGB 10.5*  HCT 31.3*  PLT 419*    Coag's No results for input(s): APTT, INR in the last 168 hours.  Sepsis Markers No results for input(s): LATICACIDVEN, PROCALCITON, O2SATVEN in the last 168 hours.  ABG Recent Labs  Lab 07/11/2017 1156  PHART 7.243*  PCO2ART 48.1*  PO2ART 98.8    Liver Enzymes Recent Labs  Lab 07/10/2017 0305  AST 40  ALT 18  ALKPHOS 147*  BILITOT 0.5  ALBUMIN 2.6*    Cardiac Enzymes No results for input(s): TROPONINI, PROBNP in the last 168 hours.  Glucose No results for input(s): GLUCAP in the last 168 hours.  Imaging Ct Abdomen Pelvis W Contrast  Result Date: 07/28/2017 CLINICAL DATA:  Fever, weakness, abdominal pain EXAM: CT ABDOMEN AND PELVIS WITH CONTRAST TECHNIQUE: Multidetector CT imaging of the abdomen and pelvis was performed using the standard protocol following bolus administration of intravenous contrast. CONTRAST:  122mL ISOVUE-300 IOPAMIDOL (ISOVUE-300) INJECTION 61% COMPARISON:  07/04/2017 FINDINGS: Lower chest: Large bilateral pleural  effusions. The left pleural effusion is higher density relative to the right and does not measure water density, likely hemothorax. Heart is normal size. Large hiatal hernia. There is a left pneumothorax, moderate-sized noted at the left lung base. Airspace opacities in both lower lobes could reflect atelectasis, or infiltrates. There is a posterior left 10th rib fracture. Hepatobiliary: Gallbladder is contracted, grossly unremarkable. Small hypodensity centrally in the right attic lobe most compatible with small cyst. Pancreas: No focal abnormality or ductal dilatation. Spleen: No focal abnormality.  Normal size. Adrenals/Urinary Tract: Simple appearing 3 cm cyst in the midpole of the right kidney. No hydronephrosis. No adrenal mass. Urinary bladder decompressed, grossly unremarkable. Stomach/Bowel: Sigmoid diverticulosis. No active diverticulitis. Large stool burden in the colon. Gastrostomy tube noted in the stomach. No evidence of bowel obstruction. Vascular/Lymphatic: Aortic atherosclerosis. No enlarged abdominal or pelvic lymph nodes.1 Reproductive: Prominent prostate with central calcifications. Other: No free fluid or free air. Musculoskeletal: Posterior left 10th rib fracture as noted above. No additional acute bony abnormality. IMPRESSION:  Moderate-sized left pneumothorax and large left hemothorax. Large right pleural effusion. Posterior left 10th rib fracture. Large hiatal hernia. Colonic diverticulosis. Left colonic diverticulosis. No active diverticulitis. Large stool burden in the colon. Aortic atherosclerosis. Prostate enlargement with central calcifications. These results were called by telephone at the time of interpretation on 07/09/2017 at 9:47 am to Dr. Eulis Foster , who verbally acknowledged these results. Electronically Signed   By: Rolm Baptise M.D.   On: 07/15/2017 09:50   Dg Chest Port 1 View  Result Date: 07/09/2017 CLINICAL DATA:  Chest tube placement for pneumothorax EXAM: PORTABLE CHEST 1  VIEW COMPARISON:  July 13, 2017 study obtained earlier in the day FINDINGS: There is now a chest tube on the left. There is currently a minimal residual left apical pneumothorax. There has been resolution of most of the effusion on the left as well. There is a small residual left pleural effusion. There is patchy atelectasis bilaterally with a fairly small right pleural effusion. Heart is upper normal in size with pulmonary vascularity within normal limits. No adenopathy. There is aortic atherosclerosis. No bone lesions. IMPRESSION: Minimal residual pneumothorax and effusion on the left after chest tube placement. There is a small right pleural effusion. There is patchy bibasilar atelectasis. Stable cardiac silhouette. There is aortic atherosclerosis. Aortic Atherosclerosis (ICD10-I70.0). Electronically Signed   By: Lowella Grip III M.D.   On: 08/05/2017 10:54   Dg Chest Port 1 View  Result Date: 07/10/2017 CLINICAL DATA:  Shortness of Breath EXAM: PORTABLE CHEST 1 VIEW COMPARISON:  July 04, 2017 FINDINGS: There is a focal pneumothorax on the left of apical and lateral in location without appreciable tension component. There is also moderate pleural effusion on the left. There is airspace consolidation in the left mid and lower lung zones. There is patchy infiltrate and effusion in the right base. Heart size and pulmonary vascularity are normal. No adenopathy. No bone lesions. IMPRESSION: Moderate left-sided hydropneumothorax without tension component. Airspace consolidation left lower lobe. A lesser degree of consolidation noted in right base. There is also a lesser degree of pleural effusion on the right. Stable cardiac silhouette. Electronically Signed   By: Lowella Grip III M.D.   On: 07/16/2017 09:32     STUDIES:  CT abd/pelvis 4/4: Moderate-sized left pneumothorax and large left hemothorax. Large right pleural effusion. Posterior left 10th rib fracture. Large hiatal hernia. Colonic  diverticulosis. Left colonic diverticulosis. No active diverticulitis. Large stool burden in the colon. Aortic atherosclerosis. Prostate enlargement with central calcifications.   CULTURES: Pleural fluid 4/5>>>  ANTIBIOTICS: Zosyn 4/5>>> vanc 4/5>>>  SIGNIFICANT EVENTS:   LINES/TUBES: Left chest tube 4/5>>>  DISCUSSION: Little to offer other than support here. He is NOT a surgical candidate for chest surgery and the likelihood of this being esophageal tear is very high.  Will admit to SDU Abx, IVFs chest tube & pain management  No escalation Doubt he will survive   ASSESSMENT / PLAN:  Acute hypoxic respiratory failure in setting of bilateral pleural effusions/probable empyema, left PTX and pneumonia.  -all likely stemming from esophageal stricture aspiration PNA and now w/ esophageal tear and infected pleural space.  Plan Supplemental oxygen  Pulse ox Morphine for pain and ativan for anxiety Chest tube management w/ 20 cm H2O suction vanc and zosyn day # 1; f/u pleural cultures DO NOT INTUBATE/FULL DNR  Sepsis 2/2 above Plan IVFs abx  No pressors  No further escalation   Esophageal stricture Dysphagia  Concern for esophageal tear Plan NPO Holding  off on confirmatory diagnostics as NOT surgical candidate and indicates clearly that he would NOT want to have surgery if offered.  Supportive care as above  At risk for fluid and electrolyte imbalance  Plan Trend chemistry   Anemia of chronic illness Plan Trend CBC   Erick Colace ACNP-BC [place] Pager # [phone] OR # [phone] if no answer   07/28/2017, 12:25 PM

## 2017-07-13 NOTE — Sedation Documentation (Signed)
Patient verbalizes he is "able to breathe a little better." Patient rates his pain at 10/10 at this time. Patient remains slightly hypotensive, but otherwise vital signs are stable- patient on 50% Veni mask. Ordered IV fluids continuing to infuse. EDMD [facility] notified. Primary RN [facility] notified.

## 2017-07-13 NOTE — ED Notes (Addendum)
Pt calling out and stating he cannot breath.  Carmen NT re-checked vitals and 94% O2 sat on room air.  Pt continues to ask for pain medication.

## 2017-07-13 NOTE — ED Notes (Signed)
Bed: KS28 Expected date:  Expected time:  Means of arrival:  Comments: RES B
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
## 2017-07-13 NOTE — ED Notes (Signed)
Pt called out stating and stating he is spitting up blood.  Will assess and notify EDP.

## 2017-07-14 ENCOUNTER — Inpatient Hospital Stay (HOSPITAL_COMMUNITY): Payer: Self-pay

## 2017-07-14 LAB — CBC
HEMATOCRIT: 29.7 % — AB (ref 39.0–52.0)
Hemoglobin: 9.8 g/dL — ABNORMAL LOW (ref 13.0–17.0)
MCH: 29.5 pg (ref 26.0–34.0)
MCHC: 33 g/dL (ref 30.0–36.0)
MCV: 89.5 fL (ref 78.0–100.0)
Platelets: 331 10*3/uL (ref 150–400)
RBC: 3.32 MIL/uL — AB (ref 4.22–5.81)
RDW: 15.9 % — ABNORMAL HIGH (ref 11.5–15.5)
WBC: 8.3 10*3/uL (ref 4.0–10.5)

## 2017-07-14 LAB — COMPREHENSIVE METABOLIC PANEL
ALBUMIN: 1.7 g/dL — AB (ref 3.5–5.0)
ALT: 20 U/L (ref 17–63)
ANION GAP: 8 (ref 5–15)
AST: 48 U/L — ABNORMAL HIGH (ref 15–41)
Alkaline Phosphatase: 58 U/L (ref 38–126)
BILIRUBIN TOTAL: 0.7 mg/dL (ref 0.3–1.2)
BUN: 29 mg/dL — ABNORMAL HIGH (ref 6–20)
CO2: 21 mmol/L — AB (ref 22–32)
Calcium: 8.1 mg/dL — ABNORMAL LOW (ref 8.9–10.3)
Chloride: 108 mmol/L (ref 101–111)
Creatinine, Ser: 1.28 mg/dL — ABNORMAL HIGH (ref 0.61–1.24)
GFR calc non Af Amer: 58 mL/min — ABNORMAL LOW (ref >60)
GLUCOSE: 94 mg/dL (ref 65–99)
POTASSIUM: 4.7 mmol/L (ref 3.5–5.1)
SODIUM: 137 mmol/L (ref 135–145)
TOTAL PROTEIN: 4.7 g/dL — AB (ref 6.5–8.1)

## 2017-07-14 MED ORDER — MORPHINE 100MG IN NS 100ML (1MG/ML) PREMIX INFUSION
10.0000 mg/h | INTRAVENOUS | Status: DC
  Administered 2017-07-14: 10 mg/h via INTRAVENOUS
  Administered 2017-07-15: 5 mg/h via INTRAVENOUS
  Administered 2017-07-16: 2 mg/h via INTRAVENOUS
  Filled 2017-07-14 (×3): qty 100

## 2017-07-14 MED ORDER — GLYCOPYRROLATE 0.2 MG/ML IJ SOLN
0.1000 mg | INTRAMUSCULAR | Status: DC | PRN
  Administered 2017-07-14: 0.1 mg via INTRAVENOUS
  Filled 2017-07-14: qty 1

## 2017-07-14 MED ORDER — MORPHINE BOLUS VIA INFUSION
5.0000 mg | INTRAVENOUS | Status: DC | PRN
  Administered 2017-07-17: 5 mg via INTRAVENOUS
  Filled 2017-07-14: qty 20

## 2017-07-14 NOTE — Progress Notes (Signed)
Upon start of shift, the [place] chest tube drain read as 571mL, not 955mL. Previous RN most likely charted this amount in error.

## 2017-07-14 NOTE — Progress Notes (Signed)
Pharmacy Antibiotic Note  Patrick [name] is a 67 y.o. male with hx severe esophageal stricture, EtOH abuse, and failure to thrive with PEG tube placed on 07/06/17 with last admission.  He was discharged on 07/10/17 and presented back to the ED from home on 07/19/2017 with c/o fever, weakness, abdominal pain and nausea.  He developed SOB while in the ED and was found to have moderate left-sided hydropneumothorax, large left hemothorax and left 10th rib fracture on CXR and chest CT. Chest tube placed on 4/5.  To start vancomycin and zosyn for suspected asp PNA from esophageal stricture and esophageal tear with infected pleural space.   Today, 07/14/2017 Day #2 antibiotics  Renal: SCr doubled,  UOP decreased  WBC Improved  Tm 100.8  GNR and GPC pairs in pleural fluid   Plan: - Due to change in renal function, hold vancomycin and check random level 4/7am - zosyn 3.375 gm IV q8h (infuse over 4 hrs) remains appropriately dosed at this time - daily scr - await cultures - de-escalate as appropriate  Height: 5\' 7"  (170.2 cm) Weight: 109 lb 2 oz (49.5 kg) IBW/kg (Calculated) : 66.1___________________________________  Temp (24hrs), Avg:99.1 F (37.3 C), Min:98.2 F (36.8 C), Max:100.8 F (38.2 C)  Recent Labs  Lab 07/08/17 0445 07/09/17 0514 07/10/17 0536 07/27/2017 0305 07/14/17 0325  WBC  --   --   --  12.7* 8.3  CREATININE 0.51* 0.46* 0.50* 0.60* 1.28*    Estimated Creatinine Clearance: 40.8 mL/min (A) (by C-G formula based on SCr of 1.28 mg/dL (H)).    No Known Allergies   Dose adjustments this admission:  4/5 vanco 1gm x 1 at 12:10  Microbiology results:  3/29 MRSA PCR: neg  4/5 BCx x2:  4/5 left pleural fluid: GPC pairs, GNR 4/5 UA: neg  Thank you for allowing pharmacy to be a part of this patient's care.  Doreene Eland, PharmD, BCPS.   Pager: [phone] 07/14/2017 9:58 AM

## 2017-07-14 NOTE — Progress Notes (Signed)
PCCM PROGRESS NOTE  Patrick [name] is a 67 y.o. male smoker admitted on 08/03/2017 with chest and abdominal pain.  Found to have hydropneumothorax with empyema.  He has hx of esophageal stricture and erosive esophagitis.  Concern for esophageal tear.  Had chest tube placed in ER and seen by surgery.  Very high risk surgical candidate.  Pt opted initially to continue medical therapies, but did not want to undergo surgery.  DNR established.  On 07/14/17 patient decided to transition to comfort measures.  SUBJECTIVE: Wants something to drink.  Doesn't feel breathing is any better.  VITAL SIGNS: BP (!) 81/55   Pulse (!) 138   Temp 98.2 F (36.8 C) (Oral)   Resp (!) 29   Ht 5\' 7"  (1.702 m)   Wt 109 lb 2 oz (49.5 kg)   SpO2 98%   BMI 17.09 kg/m   INTAKE/OUTPUT: I/O last 3 completed shifts: In: 3420 [I.V.:2120; IV Piggyback:1300] Out: 2080 [Urine:150; Chest Tube:1930]  PHYSICAL EXAM:  General - ill appearing, cachectic, using accessory muscles Eyes - pupils reactive ENT - no sinus tenderness, no oral exudate, no LAN Cardiac - regular, tachycardic, no murmur Chest - b/l crackles, Lt chest tube in place Abd - thin, decreased bowel sounds G tube in place Ext - decreased muscle bulk Skin - sacral pressure ulcer present prior to admission Neuro - follows commands, responds appropriately   CMP Latest Ref Rng & Units 07/14/2017 07/26/2017 07/10/2017  Glucose 65 - 99 mg/dL 94 127(H) 105(H)  BUN 6 - 20 mg/dL 29(H) 13 9  Creatinine 0.61 - 1.24 mg/dL 1.28(H) 0.60(L) 0.50(L)  Sodium 135 - 145 mmol/L 137 136 129(L)  Potassium 3.5 - 5.1 mmol/L 4.7 3.7 3.6  Chloride 101 - 111 mmol/L 108 99(L) 97(L)  CO2 22 - 32 mmol/L 21(L) 26 28  Calcium 8.9 - 10.3 mg/dL 8.1(L) 8.6(L) 8.4(L)  Total Protein 6.5 - 8.1 g/dL 4.7(L) 6.2(L) -  Total Bilirubin 0.3 - 1.2 mg/dL 0.7 0.5 -  Alkaline Phos 38 - 126 U/L 58 147(H) -  AST 15 - 41 U/L 48(H) 40 -  ALT 17 - 63 U/L 20 18 -    CBC Latest Ref Rng & Units 07/14/2017  07/24/2017 07/06/2017  WBC 4.0 - 10.5 K/uL 8.3 12.7(H) 4.8  Hemoglobin 13.0 - 17.0 g/dL 9.8(L) 10.5(L) 9.4(L)  Hematocrit 39.0 - 52.0 % 29.7(L) 31.3(L) 27.5(L)  Platelets 150 - 400 K/uL 331 419(H) 157    Dg Chest Port 1 View  Result Date: 07/14/2017 CLINICAL DATA:  Empyema EXAM: PORTABLE CHEST 1 VIEW COMPARISON:  07/28/2017 FINDINGS: Heart size and pulmonary vascularity are normal. Minimal residual left pneumothorax with left chest tube in place. No progression. Subcutaneous emphysema along the left chest wall. Bilateral pleural effusions with bilateral lower lung infiltrates. IMPRESSION: Minimal residual left pneumothorax with left chest tube in place. Subcutaneous emphysema along the left chest wall. Small bilateral pleural effusions with basilar infiltration similar to prior study. Electronically Signed   By: Lucienne Capers M.D.   On: 07/14/2017 06:03    ASSESSMENT: Acute hypoxic respiratory failure Hydropneumothorax Left chest empyema HCAP Esophageal stricture with erosive esophagitis Severe protein calorie malnutrition Cachexia Acute renal failure from ATN Metabolic acidosis with lactic acidosis Anemia of critical illness Emphysema Tobacco abuse Stage 2 sacral pressure wound present prior to this admission  PLAN: DNR/DNI Transition to comfort measures D/c IV fluids, ABx Keep chest tube in for now on water seal Liberalize diet for comfort Morphine gtt Prn ativan Prn  robinul for respiratory secretions  Patient requested that his family and POA not be notified of his illness at this time  Chesley Mires, MD [place] 07/14/2017, 12:17 PM

## 2017-07-14 NOTE — Progress Notes (Signed)
Pt was resting and unavailable when Ch arrived. Spoke briefly w/pt's nurse and asked her to page when pt is awake in order to follow-up and offer support. [place], MDiv   07/14/17 1300  Clinical Encounter Type  Visited With Health care provider

## 2017-07-15 MED ORDER — ACETAMINOPHEN 650 MG RE SUPP: 650.0000 mg | RECTAL | Status: DC | PRN
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
## 2017-07-15 NOTE — Progress Notes (Signed)
Report called and given to [place], rn on [place] and pt to be transferred momentarily. All of nurse's questions answered to her satisfaction.  Hale Bogus.

## 2017-07-15 NOTE — Progress Notes (Signed)
Nutrition Brief Note  Pt identified via malnutrition screening tool.  Chart reviewed. Pt now transitioning to comfort care.  No nutrition interventions warranted at this time.   Clayton Bibles, MS, RD, [facility] Dietitian Pager: [phone] After Hours Pager: [phone]

## 2017-07-16 LAB — BODY FLUID CULTURE

## 2017-07-16 LAB — PATHOLOGIST SMEAR REVIEW
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
## 2017-07-18 LAB — CULTURE, BLOOD (ROUTINE X 2)
CULTURE: NO GROWTH
Culture: NO GROWTH
Special Requests: ADEQUATE

## 2017-07-30 ENCOUNTER — Ambulatory Visit: Payer: Self-pay | Admitting: Nurse Practitioner

## 2017-07-31 ENCOUNTER — Ambulatory Visit: Payer: Self-pay | Admitting: Family Medicine

## 2017-08-06 ENCOUNTER — Other Ambulatory Visit: Payer: Self-pay | Admitting: Family Medicine

## 2017-08-08 NOTE — Progress Notes (Signed)
Notified lab tech, Merrilyn Puma, to save all body specimens...Marland Kitchen per Medical Examiner.

## 2017-08-08 NOTE — Progress Notes (Signed)
[place] [facility] made aware of death. Contact person Ever[facility] Farrier contacted and made aware.

## 2017-08-08 NOTE — Death Summary Note (Signed)
DEATH SUMMARY   Patient Details  Name: Patrick [name] MRN: [id number] DOB: 1951-04-03  Admission/Discharge Information   Admit Date:  Jul 17, 2017  Date of Death: Date of Death: 2017-07-21  Time of Death: Time of Death: 69  Length of Stay: 4  Referring Physician: [name], MD   Reason(s) for Hospitalization  Chest and abdominal pain, fatigue  Diagnoses  Preliminary cause of death:   Empyema Secondary Diagnoses (including complications and co-morbidities):  Active Problems:   Empyema [facility]) Pneumothorax Esophagitis  Brief Hospital Course (including significant findings, care, treatment, and services provided and events leading to death)  Patrick [name] is a 67 y.o. year old male who presented to [facility] on July 18, 2022 with chest and abdominal pain in the setting of several months of weight loss, fatigue.  He was found to have a hydropneumothorax so a chest tube was placed.  Air and purulent material drained.  He had an empyema.  There was concern for possible esophageal rupture but it was felt that the patient was not a candidate for surgery due to his poor overall health.  He was given IV antibiotics, fluids and supportive care but his condition worsened.  Full comfort measures were initiated when it was clear that his condition was declining.  He died peacefully as an inpatient.  Pertinent Labs and Studies  Significant Diagnostic Studies Ct Abdomen Wo Contrast  Result Date: 07/04/2017 CLINICAL DATA:  Gastrostomy tube evaluation EXAM: CT ABDOMEN WITHOUT CONTRAST TECHNIQUE: Multidetector CT imaging of the abdomen was performed following the standard protocol without IV contrast. COMPARISON:  01/04/2017 FINDINGS: Lower chest: Irregular nodules have developed at the left lung base. The largest lobulated nodule measures 1.2 cm. Hepatobiliary: Hypodensity in the right lobe of the liver is stable. Gallstones. Pancreas: Unremarkable Spleen: Unremarkable Adrenals/Urinary Tract: Right renal cyst.  Left kidney is unremarkable. Adrenal glands are within normal limits. Stomach/Bowel: The stomach is opposed to the anterior abdominal wall. No obvious mass in the colon. No evidence of small-bowel obstruction. Vascular/Lymphatic: Atherosclerotic calcifications of the aorta. No abnormal adenopathy. Other: There is a small amount of free fluid anterior to the liver. Musculoskeletal: No vertebral compression. IMPRESSION: New nodules at the left lung base. The largest measures 1.2 cm. Malignancy is not excluded. Consider PET-CT. Free-fluid in the abdomen. Stomach is opposed to the anterior abdominal wall. Electronically Signed   By: Marybelle Killings M.D.   On: 07/04/2017 15:51   Dg Chest 2 View  Result Date: 07/04/2017 CLINICAL DATA:  History of esophagitis and stricture EXAM: CHEST - 2 VIEW COMPARISON:  07/03/2017, esophagram 06/07/2017, CT chest 11/29/2016 FINDINGS: Hyperinflation with minimal scarring at the left base. No acute consolidation or pleural effusion. Stable cardiomediastinal silhouette with moderate hiatal hernia. No pneumothorax. IMPRESSION: 1. Hyperinflation with patchy atelectasis or scar at the left base 2. Moderate hiatal hernia Electronically Signed   By: Donavan Foil M.D.   On: 07/04/2017 16:04   Dg Chest 2 View  Result Date: 07/03/2017 CLINICAL DATA:  Nausea and vomiting, worsening over the last week. EXAM: CHEST - 2 VIEW COMPARISON:  06/05/2017 FINDINGS: Heart size is normal. Right chest is clear. Hiatal hernia again noted. Worsened patchy density at the left base consistent with bronchopneumonia. Aortic atherosclerosis again noted. IMPRESSION: Worsened patchy density at the left base consistent with bronchopneumonia. Hiatal hernia as seen previously. Aortic atherosclerosis. Electronically Signed   By: Nelson Chimes M.D.   On: 07/03/2017 13:23   Ct Head Wo Contrast  Result Date: 07/03/2017 CLINICAL DATA:  Altered mental status.  Nausea and vomiting EXAM: CT HEAD WITHOUT CONTRAST  TECHNIQUE: Contiguous axial images were obtained from the base of the skull through the vertex without intravenous contrast. COMPARISON:  Head CT June 05, 2017 and brain MRI June 05, 2017 FINDINGS: Brain: There is mild diffuse atrophy. There is no intracranial mass, hemorrhage, extra-axial fluid collection, or midline shift. There is patchy small vessel disease in the centra semiovale bilaterally. Elsewhere gray-white compartments appear unremarkable. No acute infarct evident. Vascular: No hyperdense vessel. There is calcification in each distal carotid siphon region. Skull: The bony calvarium appears intact. Sinuses/Orbits: There is mild mucosal thickening in several ethmoid air cells. Other visualized paranasal sinuses are clear. Orbits appear symmetric bilaterally. Other: Mastoid air cells are clear. IMPRESSION: Mild atrophy with patchy periventricular small vessel disease. No acute infarct evident. No mass or hemorrhage. There are foci of arterial vascular calcification. There is mucosal thickening in several ethmoid air cells. Electronically Signed   By: Lowella Grip III M.D.   On: 07/03/2017 12:31   Ct Abdomen Pelvis W Contrast  Result Date: 08/02/2017 CLINICAL DATA:  Fever, weakness, abdominal pain EXAM: CT ABDOMEN AND PELVIS WITH CONTRAST TECHNIQUE: Multidetector CT imaging of the abdomen and pelvis was performed using the standard protocol following bolus administration of intravenous contrast. CONTRAST:  173mL ISOVUE-300 IOPAMIDOL (ISOVUE-300) INJECTION 61% COMPARISON:  07/04/2017 FINDINGS: Lower chest: Large bilateral pleural effusions. The left pleural effusion is higher density relative to the right and does not measure water density, likely hemothorax. Heart is normal size. Large hiatal hernia. There is a left pneumothorax, moderate-sized noted at the left lung base. Airspace opacities in both lower lobes could reflect atelectasis, or infiltrates. There is a posterior left 10th rib  fracture. Hepatobiliary: Gallbladder is contracted, grossly unremarkable. Small hypodensity centrally in the right attic lobe most compatible with small cyst. Pancreas: No focal abnormality or ductal dilatation. Spleen: No focal abnormality.  Normal size. Adrenals/Urinary Tract: Simple appearing 3 cm cyst in the midpole of the right kidney. No hydronephrosis. No adrenal mass. Urinary bladder decompressed, grossly unremarkable. Stomach/Bowel: Sigmoid diverticulosis. No active diverticulitis. Large stool burden in the colon. Gastrostomy tube noted in the stomach. No evidence of bowel obstruction. Vascular/Lymphatic: Aortic atherosclerosis. No enlarged abdominal or pelvic lymph nodes.1 Reproductive: Prominent prostate with central calcifications. Other: No free fluid or free air. Musculoskeletal: Posterior left 10th rib fracture as noted above. No additional acute bony abnormality. IMPRESSION: Moderate-sized left pneumothorax and large left hemothorax. Large right pleural effusion. Posterior left 10th rib fracture. Large hiatal hernia. Colonic diverticulosis. Left colonic diverticulosis. No active diverticulitis. Large stool burden in the colon. Aortic atherosclerosis. Prostate enlargement with central calcifications. These results were called by telephone at the time of interpretation on 08/04/2017 at 9:47 am to Dr. Eulis Foster , who verbally acknowledged these results. Electronically Signed   By: Rolm Baptise M.D.   On: 07/24/2017 09:50   Ir Gastrostomy Tube Mod Sed  Result Date: 07/06/2017 INDICATION: Severe esophageal stenosis EXAM: PERC PLACEMENT GASTROSTOMY MEDICATIONS: Ancef 2 g; Antibiotics were administered within 1 hour of the procedure. Glucagon 1 mg IV ANESTHESIA/SEDATION: Versed 1 mg IV; Fentanyl 50 mcg IV Moderate Sedation Time:  24 minutes The patient was continuously monitored during the procedure by the interventional radiology nurse under my direct supervision. CONTRAST:  65mL ISOVUE-300 IOPAMIDOL  (ISOVUE-300) INJECTION 61%, 63mL ISOVUE-300 IOPAMIDOL (ISOVUE-300) INJECTION 61% - administered into the gastric lumen. FLUOROSCOPY TIME:  Fluoroscopy Time: 2 minutes 18 seconds (27 mGy). COMPLICATIONS: None immediate. PROCEDURE:  The procedure, risks, benefits, and alternatives were explained to the patient. Questions regarding the procedure were encouraged and answered. The patient understands and consents to the procedure. The epigastrium was prepped with Betadine in a sterile fashion, and a sterile drape was applied covering the operative field. A sterile gown and sterile gloves were used for the procedure. Glucagon was administered intravenously. A 5 French catheter was advanced from the oral cavity to the stomach. The stomach was distended with gas. An 18 gauge needle was advanced into the lumen of the stomach via the body. A T tack was deployed. A total of 3 T tacks were deployed in a triangular configuration. A needle was utilized to puncture the stomach between the T tacks. It was removed over an Amplatz wire. The tract was dilated to 20 [place]. A 20 French peel-away was inserted. The 53 French gastrostomy tube was inserted and the peel-away sheath was removed. The balloon was insufflated with 10 cc saline. Contrast was injected. FINDINGS: Images demonstrate placement of an 62 French balloon retention gastrostomy tube. IMPRESSION: Successful 88 French balloon retention gastrostomy tube placement. Electronically Signed   By: Marybelle Killings M.D.   On: 07/06/2017 15:03   Dg Chest Port 1 View  Result Date: 07/14/2017 CLINICAL DATA:  Empyema EXAM: PORTABLE CHEST 1 VIEW COMPARISON:  08/05/2017 FINDINGS: Heart size and pulmonary vascularity are normal. Minimal residual left pneumothorax with left chest tube in place. No progression. Subcutaneous emphysema along the left chest wall. Bilateral pleural effusions with bilateral lower lung infiltrates. IMPRESSION: Minimal residual left pneumothorax with left chest tube  in place. Subcutaneous emphysema along the left chest wall. Small bilateral pleural effusions with basilar infiltration similar to prior study. Electronically Signed   By: Lucienne Capers M.D.   On: 07/14/2017 06:03   Dg Chest Port 1 View  Result Date: 07/25/2017 CLINICAL DATA:  Chest tube placement for pneumothorax EXAM: PORTABLE CHEST 1 VIEW COMPARISON:  July 13, 2017 study obtained earlier in the day FINDINGS: There is now a chest tube on the left. There is currently a minimal residual left apical pneumothorax. There has been resolution of most of the effusion on the left as well. There is a small residual left pleural effusion. There is patchy atelectasis bilaterally with a fairly small right pleural effusion. Heart is upper normal in size with pulmonary vascularity within normal limits. No adenopathy. There is aortic atherosclerosis. No bone lesions. IMPRESSION: Minimal residual pneumothorax and effusion on the left after chest tube placement. There is a small right pleural effusion. There is patchy bibasilar atelectasis. Stable cardiac silhouette. There is aortic atherosclerosis. Aortic Atherosclerosis (ICD10-I70.0). Electronically Signed   By: Lowella Grip III M.D.   On: 08/06/2017 10:54   Dg Chest Port 1 View  Result Date: 08/02/2017 CLINICAL DATA:  Shortness of Breath EXAM: PORTABLE CHEST 1 VIEW COMPARISON:  July 04, 2017 FINDINGS: There is a focal pneumothorax on the left of apical and lateral in location without appreciable tension component. There is also moderate pleural effusion on the left. There is airspace consolidation in the left mid and lower lung zones. There is patchy infiltrate and effusion in the right base. Heart size and pulmonary vascularity are normal. No adenopathy. No bone lesions. IMPRESSION: Moderate left-sided hydropneumothorax without tension component. Airspace consolidation left lower lobe. A lesser degree of consolidation noted in right base. There is also a lesser  degree of pleural effusion on the right. Stable cardiac silhouette. Electronically Signed   By: Lowella Grip III  M.D.   On: 07/31/2017 09:32    Microbiology Recent Results (from the past 240 hour(s))  Body fluid culture     Status: None   Collection Time: 07/26/2017 11:07 AM  Result Value Ref Range Status   Specimen Description   Final    PLEURAL LEFT Performed at [facility], [place] [address], [place], [place] [address]    Special Requests   Final    NONE Performed at [facility], [place] [address], [place], [place] [address]    Gram Stain   Final    WBC PRESENT, PREDOMINANTLY PMN GRAM POSITIVE COCCI IN PAIRS GRAM NEGATIVE RODS Gram Stain Report Called to,Read Back By and Verified With: WEST,S. RN @1328  ON 4.5.19 BY [facility] Performed at [place] [address], [place], [place] [address]    Culture   Final    FEW VIRIDANS STREPTOCOCCUS FEW HAEMOPHILUS PARAINFLUENZAE BETA LACTAMASE NEGATIVE Performed at [facility], [place] [address], [place], [place] [address]    Report Status 07/16/2017 FINAL  Final   Organism ID, Bacteria VIRIDANS STREPTOCOCCUS  Final      Susceptibility   Viridans streptococcus - MIC*    TETRACYCLINE 0.5 SENSITIVE Sensitive     VANCOMYCIN 0.5 SENSITIVE Sensitive     CLINDAMYCIN <=0.25 SENSITIVE Sensitive     PENICILLIN Value in next row Sensitive      SENSITIVE0.06    CEFTRIAXONE Value in next row Sensitive      SENSITIVE0.12    * FEW VIRIDANS STREPTOCOCCUS  Culture, blood (routine x 2)     Status: None   Collection Time: 07/27/2017 11:41 AM  Result Value Ref Range Status   Specimen Description   Final    BLOOD LEFT HAND Performed at [place] [address] [place], [place] [address]    Special Requests   Final    BOTTLES DRAWN AEROBIC ONLY Blood Culture results may not be optimal due to an inadequate volume of blood received in culture bottles Performed at  [place] [address], [place], [place] [address]    Culture   Final    NO GROWTH 5 DAYS Performed at [facility], [place] [address], [place], [place] [address]    Report Status 07/18/2017 FINAL  Final  Culture, blood (routine x 2)     Status: None   Collection Time: 07/16/2017 12:30 PM  Result Value Ref Range Status   Specimen Description   Final    BLOOD RIGHT ANTECUBITAL Performed at [place] [address] [place], [place] [address]    Special Requests   Final    BOTTLES DRAWN AEROBIC AND ANAEROBIC Blood Culture adequate volume Performed at [place] [address], [place], [place] [address]    Culture   Final    NO GROWTH 5 DAYS Performed at [facility], [place] [address] [place],  [address]    Report Status 07/18/2017 FINAL  Final    Lab Basic Metabolic Panel: Recent Labs  Lab 07/14/17 0325  NA 137  K 4.7  CL 108  CO2 21*  GLUCOSE 94  BUN 29*  CREATININE 1.28*  CALCIUM 8.1*   Liver Function Tests: Recent Labs  Lab 07/14/17 0325  AST 48*  ALT 20  ALKPHOS 58  BILITOT 0.7  PROT 4.7*  ALBUMIN 1.7*   No results for input(s): LIPASE, AMYLASE in the last 168 hours. No  results for input(s): AMMONIA in the last 168 hours. CBC: Recent Labs  Lab 07/14/17 0325  WBC 8.3  HGB 9.8*  HCT 29.7*  MCV 89.5  PLT 331   Cardiac Enzymes: No results for input(s): CKTOTAL, CKMB, CKMBINDEX, TROPONINI in the last 168 hours. Sepsis Labs: Recent Labs  Lab 07/14/17 0325  WBC 8.3    Procedures/Operations  Chest tube   [facility] 07/20/2017, 5:38 AM

## 2017-08-08 NOTE — Progress Notes (Signed)
60 mls of morphine drip (1mg /46ml) wasted in sharps. Witnessed by Maudie Mercury, RN.

## 2017-08-08 NOTE — Progress Notes (Addendum)
Per Everlene Farrier ex wife patient has a son Zohar Maroney. Liane Comber has been notified and made aware of fathers death and is ok with funeral home choice made by Everlene Farrier. Everlene Farrier is to come to unit to pick up patients belongings.

## 2017-08-08 NOTE — Progress Notes (Signed)
Medical examiner notified.
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
## 2017-08-08 DEATH — deceased

## 2018-09-19 IMAGING — CR DG CHEST 2V
2 series · 2 of 2 positions shown · non-contrast
Comparison: Chest radiograph April 26, 2011

CLINICAL DATA: Cough and wheezing.  Hypotensive.

EXAM:
CHEST  2 VIEW

[w chest lat]
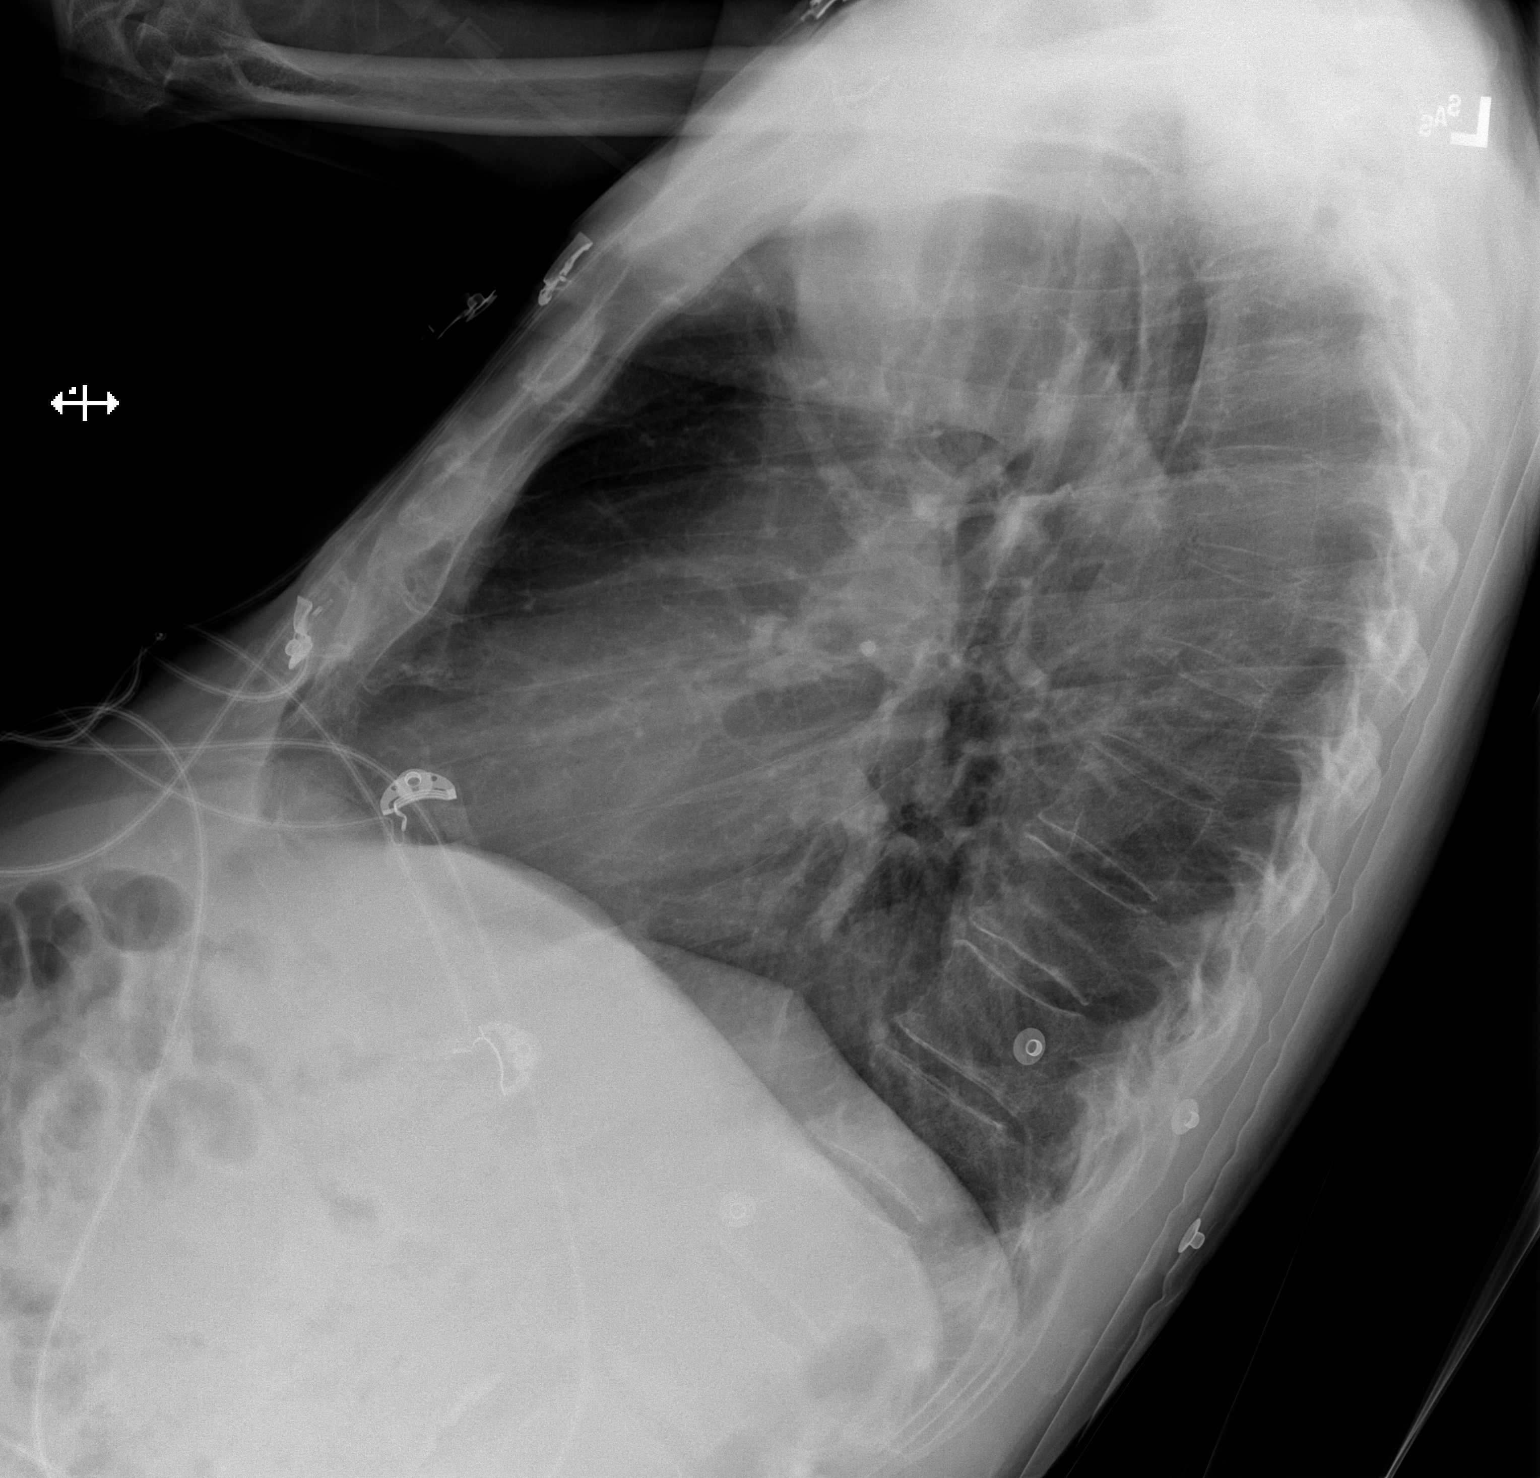

[x chest ap]
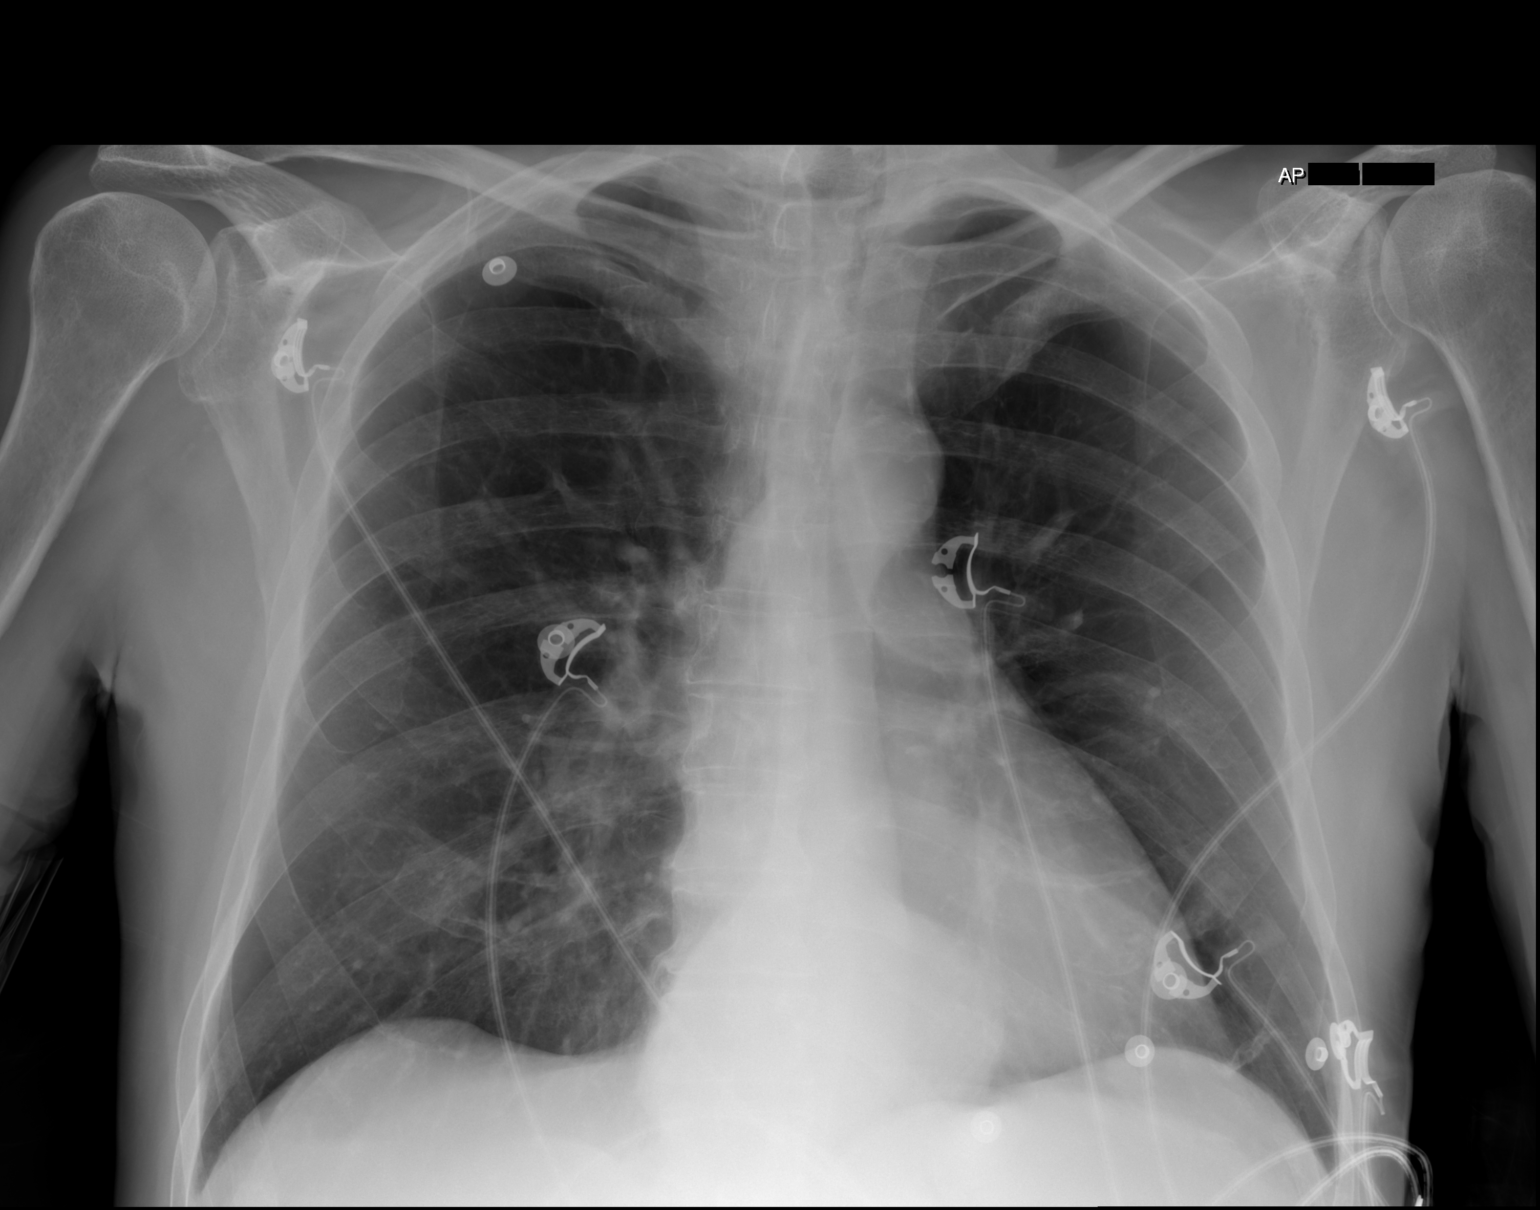

[2 of 2 positions shown; findings below may reference images not displayed]

FINDINGS: Increased lung volumes with apical bullous changes. Cardiac
silhouette is normal in size. Mediastinal silhouette is
nonsuspicious, mildly calcified aortic knob. Small to moderate
hiatal hernia. No pleural effusion or focal consolidation. No
pneumothorax. Soft tissue planes and included osseous structures are
nonsuspicious.
IMPRESSION: COPD, no acute cardiopulmonary process.

Small to moderate hiatal hernia.

## 2019-02-02 IMAGING — CR DG CHEST 1V PORT
1 series · 1 of 1 positions shown · non-contrast
Comparison: 04/09/2017

CLINICAL DATA: Pneumonia

EXAM:
PORTABLE CHEST 1 VIEW

[AP]
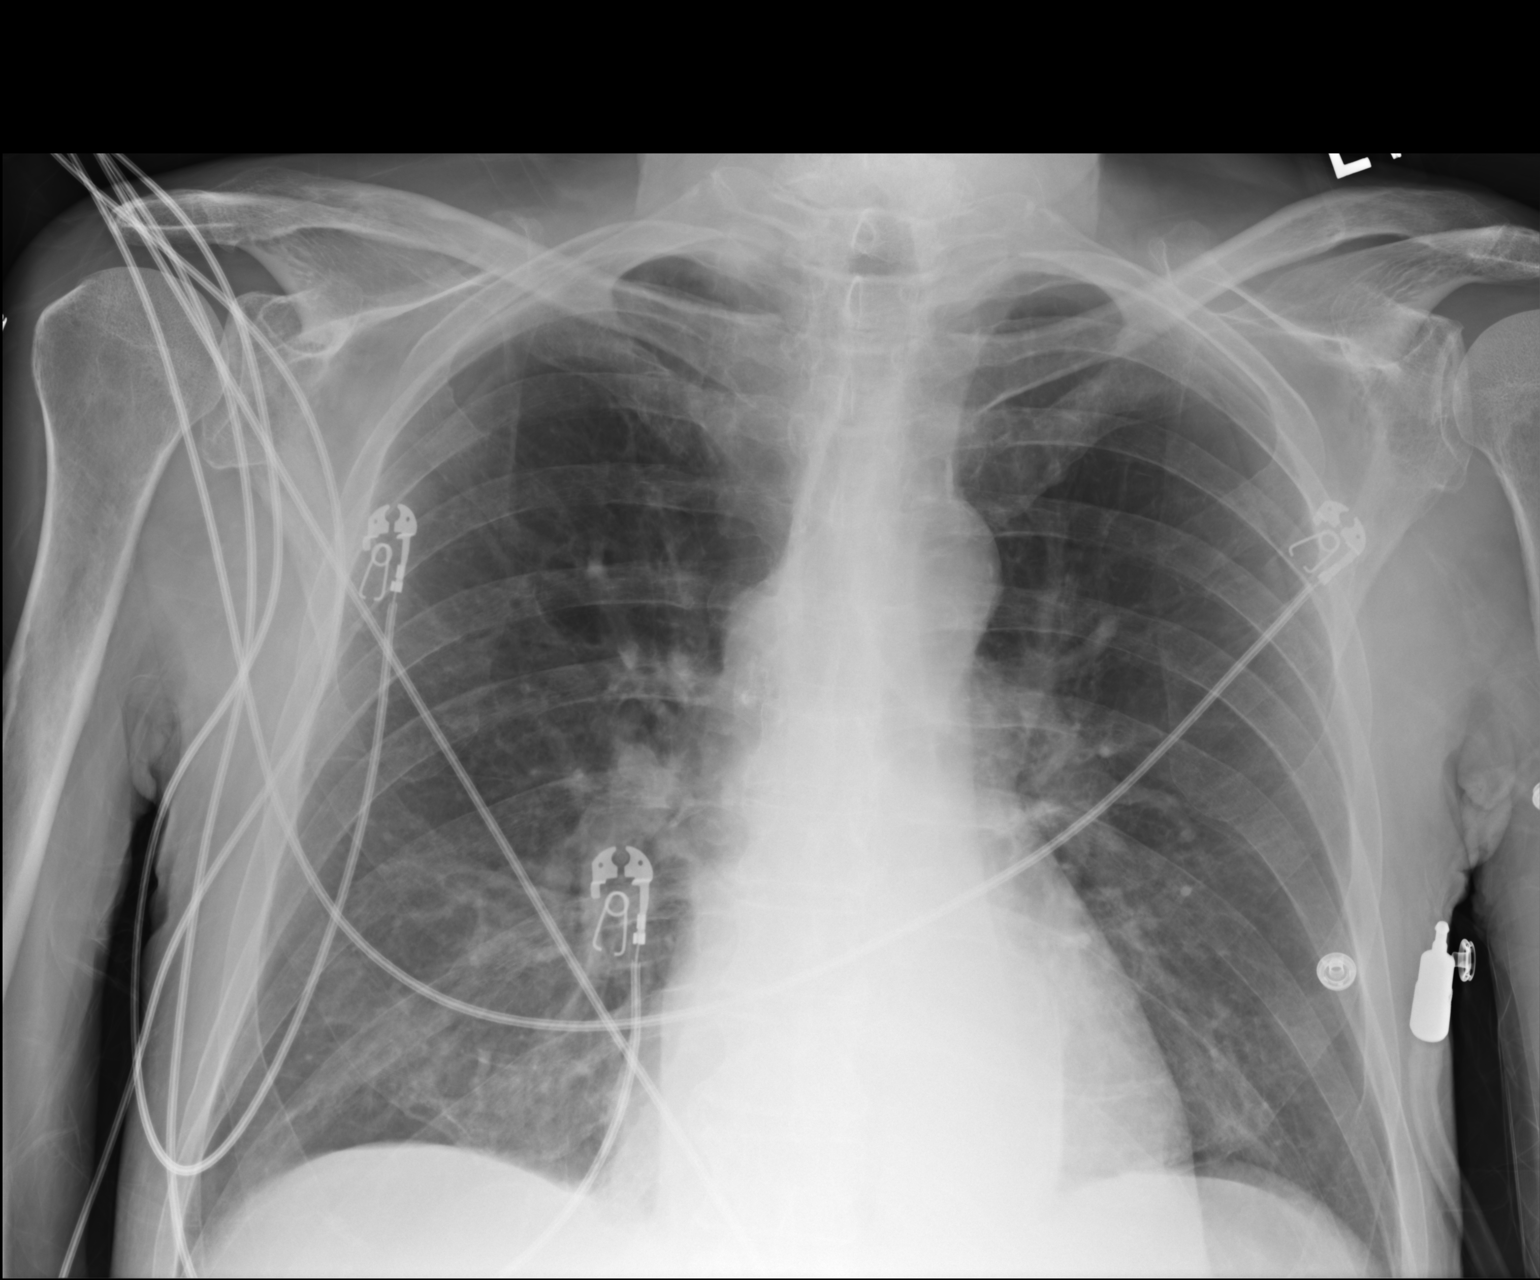

[1 of 1 positions shown; findings below may reference images not displayed]

FINDINGS: 3331 hours. The lungs are clear without focal pneumonia, edema,
pneumothorax or pleural effusion. No The cardiopericardial
silhouette is within normal limits for size. Small to moderate
hiatal hernia. The visualized bony structures of the thorax are
intact. Telemetry leads overlie the chest.
IMPRESSION: No active disease.

## 2019-04-28 IMAGING — CR DG CHEST 2V
2 series · 2 of 2 positions shown · non-contrast
Comparison: 07/03/2017, esophagram 06/07/2017, CT chest 11/29/2016

CLINICAL DATA: History of esophagitis and stricture

EXAM:
CHEST - 2 VIEW

[w chest lat]
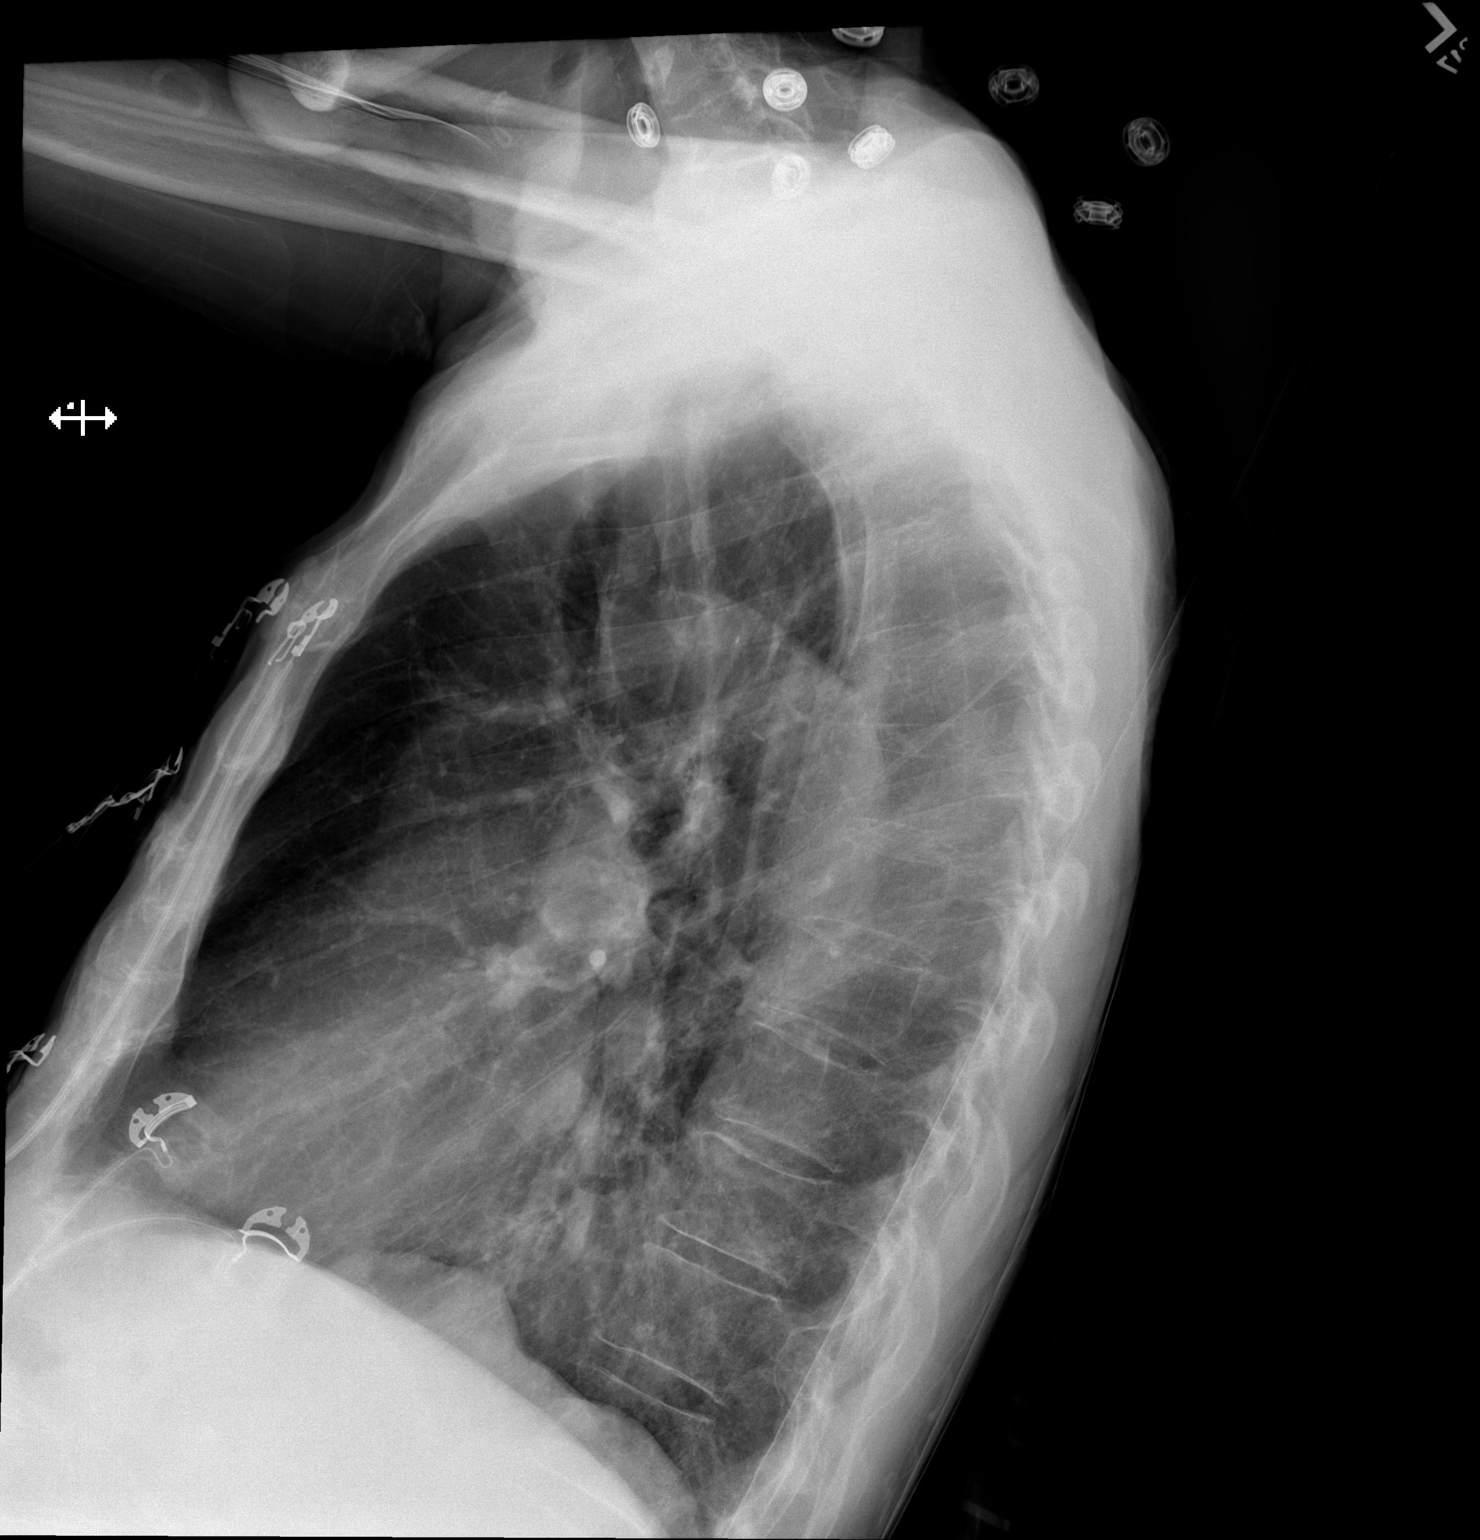

[x chest ap]
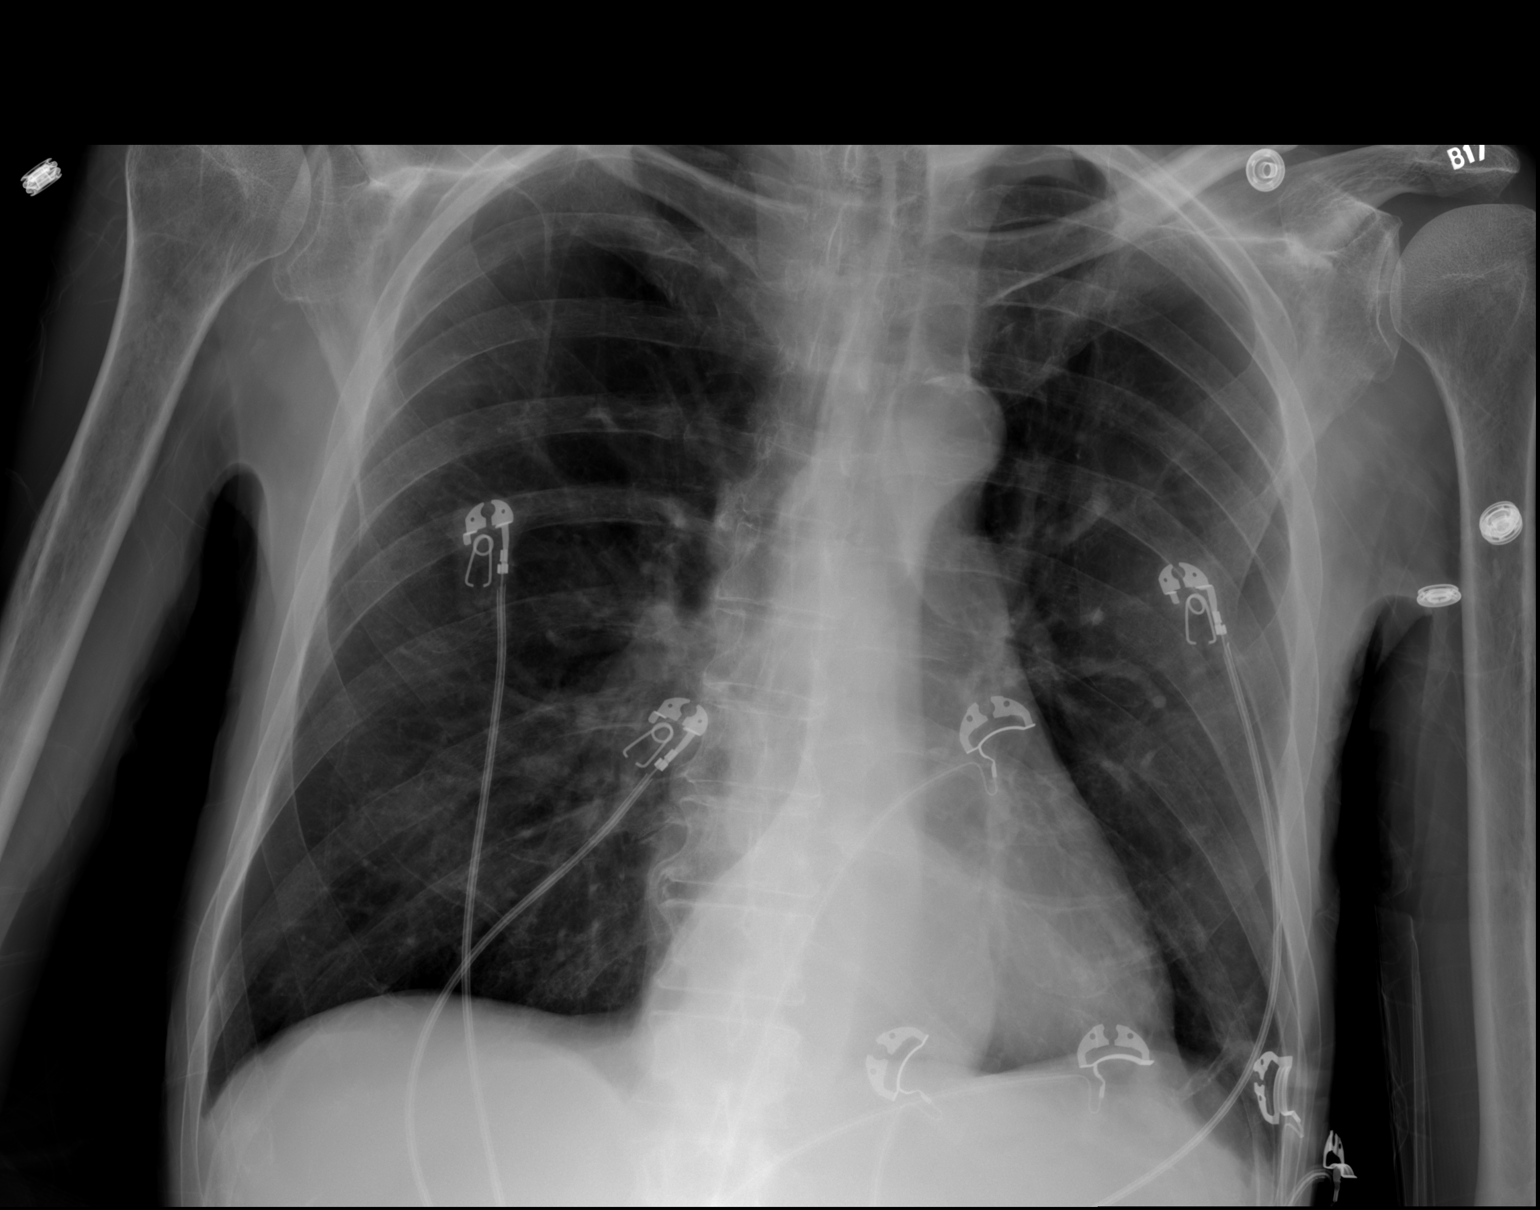

[2 of 2 positions shown; findings below may reference images not displayed]

FINDINGS: Hyperinflation with minimal scarring at the left base. No acute
consolidation or pleural effusion. Stable cardiomediastinal
silhouette with moderate hiatal hernia. No pneumothorax.
IMPRESSION: 1. Hyperinflation with patchy atelectasis or scar at the left base
2. Moderate hiatal hernia
